# Patient Record
Sex: Female | Born: 1965 | Race: White | Hispanic: No | Marital: Married | Smoking: Current every day smoker
Health system: Southern US, Community
[De-identification: ages and names within clinical notes are randomized; demographics above are authoritative.]

## PROBLEM LIST (undated history)

## (undated) DIAGNOSIS — I499 Cardiac arrhythmia, unspecified: Secondary | ICD-10-CM

## (undated) DIAGNOSIS — J449 Chronic obstructive pulmonary disease, unspecified: Secondary | ICD-10-CM

## (undated) DIAGNOSIS — I341 Nonrheumatic mitral (valve) prolapse: Secondary | ICD-10-CM

## (undated) DIAGNOSIS — J439 Emphysema, unspecified: Secondary | ICD-10-CM

## (undated) DIAGNOSIS — M549 Dorsalgia, unspecified: Secondary | ICD-10-CM

## (undated) DIAGNOSIS — M1990 Unspecified osteoarthritis, unspecified site: Secondary | ICD-10-CM

## (undated) DIAGNOSIS — O039 Complete or unspecified spontaneous abortion without complication: Secondary | ICD-10-CM

## (undated) DIAGNOSIS — J45909 Unspecified asthma, uncomplicated: Secondary | ICD-10-CM

## (undated) DIAGNOSIS — E119 Type 2 diabetes mellitus without complications: Secondary | ICD-10-CM

## (undated) DIAGNOSIS — I10 Essential (primary) hypertension: Secondary | ICD-10-CM

## (undated) DIAGNOSIS — R002 Palpitations: Secondary | ICD-10-CM

## (undated) HISTORY — DX: Cardiac arrhythmia, unspecified: I49.9

## (undated) HISTORY — DX: Essential (primary) hypertension: I10

## (undated) HISTORY — DX: Type 2 diabetes mellitus without complications: E11.9

## (undated) HISTORY — PX: SKIN CANCER EXCISION: SHX779

---

## 1998-02-25 ENCOUNTER — Other Ambulatory Visit: Admission: RE | Admit: 1998-02-25 | Discharge: 1998-02-25 | Payer: Self-pay | Admitting: Gynecology

## 2000-05-22 ENCOUNTER — Emergency Department (HOSPITAL_COMMUNITY): Admission: EM | Admit: 2000-05-22 | Discharge: 2000-05-22 | Payer: Self-pay | Admitting: Emergency Medicine

## 2003-02-11 ENCOUNTER — Other Ambulatory Visit: Admission: RE | Admit: 2003-02-11 | Discharge: 2003-02-11 | Payer: Self-pay | Admitting: Gynecology

## 2005-04-02 ENCOUNTER — Emergency Department (HOSPITAL_COMMUNITY): Admission: EM | Admit: 2005-04-02 | Discharge: 2005-04-02 | Payer: Self-pay | Admitting: Family Medicine

## 2005-04-06 ENCOUNTER — Emergency Department (HOSPITAL_COMMUNITY): Admission: EM | Admit: 2005-04-06 | Discharge: 2005-04-06 | Payer: Self-pay | Admitting: Family Medicine

## 2005-07-26 ENCOUNTER — Emergency Department (HOSPITAL_COMMUNITY): Admission: EM | Admit: 2005-07-26 | Discharge: 2005-07-26 | Payer: Self-pay | Admitting: Emergency Medicine

## 2006-02-28 ENCOUNTER — Emergency Department (HOSPITAL_COMMUNITY): Admission: EM | Admit: 2006-02-28 | Discharge: 2006-02-28 | Payer: Self-pay | Admitting: Emergency Medicine

## 2007-05-19 ENCOUNTER — Emergency Department (HOSPITAL_COMMUNITY): Admission: EM | Admit: 2007-05-19 | Discharge: 2007-05-19 | Payer: Self-pay | Admitting: Emergency Medicine

## 2007-08-14 ENCOUNTER — Ambulatory Visit: Payer: Self-pay | Admitting: Obstetrics and Gynecology

## 2007-08-14 ENCOUNTER — Inpatient Hospital Stay (HOSPITAL_COMMUNITY): Admission: AD | Admit: 2007-08-14 | Discharge: 2007-08-14 | Payer: Self-pay | Admitting: Gynecology

## 2007-08-15 ENCOUNTER — Ambulatory Visit (HOSPITAL_COMMUNITY): Admission: RE | Admit: 2007-08-15 | Discharge: 2007-08-15 | Payer: Self-pay | Admitting: Obstetrics and Gynecology

## 2007-08-23 ENCOUNTER — Ambulatory Visit: Payer: Self-pay | Admitting: Obstetrics & Gynecology

## 2007-09-10 ENCOUNTER — Encounter: Payer: Self-pay | Admitting: Obstetrics and Gynecology

## 2007-09-10 ENCOUNTER — Inpatient Hospital Stay (HOSPITAL_COMMUNITY): Admission: AD | Admit: 2007-09-10 | Discharge: 2007-09-10 | Payer: Self-pay | Admitting: Obstetrics and Gynecology

## 2007-09-14 ENCOUNTER — Ambulatory Visit (HOSPITAL_COMMUNITY): Admission: RE | Admit: 2007-09-14 | Discharge: 2007-09-14 | Payer: Self-pay | Admitting: Obstetrics & Gynecology

## 2007-09-17 ENCOUNTER — Ambulatory Visit: Payer: Self-pay | Admitting: Obstetrics & Gynecology

## 2007-09-20 ENCOUNTER — Ambulatory Visit: Payer: Self-pay | Admitting: Obstetrics & Gynecology

## 2007-09-27 ENCOUNTER — Ambulatory Visit: Payer: Self-pay | Admitting: Obstetrics & Gynecology

## 2007-10-03 ENCOUNTER — Inpatient Hospital Stay (HOSPITAL_COMMUNITY): Admission: AD | Admit: 2007-10-03 | Discharge: 2007-10-03 | Payer: Self-pay | Admitting: Obstetrics and Gynecology

## 2007-10-03 ENCOUNTER — Ambulatory Visit: Payer: Self-pay | Admitting: Obstetrics and Gynecology

## 2007-10-10 ENCOUNTER — Ambulatory Visit: Payer: Self-pay | Admitting: Obstetrics & Gynecology

## 2007-10-12 ENCOUNTER — Ambulatory Visit (HOSPITAL_COMMUNITY): Admission: RE | Admit: 2007-10-12 | Discharge: 2007-10-12 | Payer: Self-pay | Admitting: Obstetrics & Gynecology

## 2007-10-17 ENCOUNTER — Ambulatory Visit: Payer: Self-pay | Admitting: Obstetrics & Gynecology

## 2007-10-24 ENCOUNTER — Ambulatory Visit: Payer: Self-pay | Admitting: Obstetrics & Gynecology

## 2007-10-26 ENCOUNTER — Observation Stay (HOSPITAL_COMMUNITY): Admission: AD | Admit: 2007-10-26 | Discharge: 2007-10-26 | Payer: Self-pay | Admitting: Family Medicine

## 2007-10-26 ENCOUNTER — Ambulatory Visit: Payer: Self-pay | Admitting: Family Medicine

## 2007-10-29 ENCOUNTER — Observation Stay (HOSPITAL_COMMUNITY): Admission: RE | Admit: 2007-10-29 | Discharge: 2007-10-30 | Payer: Self-pay | Admitting: Family Medicine

## 2007-10-29 ENCOUNTER — Ambulatory Visit: Payer: Self-pay | Admitting: Obstetrics & Gynecology

## 2007-11-05 ENCOUNTER — Inpatient Hospital Stay (HOSPITAL_COMMUNITY): Admission: AD | Admit: 2007-11-05 | Discharge: 2007-11-05 | Payer: Self-pay | Admitting: Obstetrics & Gynecology

## 2007-11-05 ENCOUNTER — Ambulatory Visit: Payer: Self-pay | Admitting: *Deleted

## 2007-11-13 ENCOUNTER — Inpatient Hospital Stay (HOSPITAL_COMMUNITY): Admission: AD | Admit: 2007-11-13 | Discharge: 2007-11-16 | Payer: Self-pay | Admitting: Obstetrics and Gynecology

## 2007-11-13 ENCOUNTER — Ambulatory Visit: Payer: Self-pay | Admitting: Obstetrics and Gynecology

## 2007-11-14 ENCOUNTER — Encounter: Payer: Self-pay | Admitting: Obstetrics and Gynecology

## 2007-11-30 ENCOUNTER — Inpatient Hospital Stay (HOSPITAL_COMMUNITY): Admission: AD | Admit: 2007-11-30 | Discharge: 2007-12-04 | Payer: Self-pay | Admitting: Obstetrics and Gynecology

## 2007-11-30 ENCOUNTER — Ambulatory Visit: Payer: Self-pay | Admitting: Obstetrics & Gynecology

## 2008-03-06 ENCOUNTER — Emergency Department (HOSPITAL_COMMUNITY): Admission: EM | Admit: 2008-03-06 | Discharge: 2008-03-06 | Payer: Self-pay | Admitting: Family Medicine

## 2008-04-06 ENCOUNTER — Emergency Department (HOSPITAL_COMMUNITY): Admission: EM | Admit: 2008-04-06 | Discharge: 2008-04-06 | Payer: Self-pay | Admitting: Emergency Medicine

## 2008-11-14 ENCOUNTER — Emergency Department (HOSPITAL_COMMUNITY): Admission: EM | Admit: 2008-11-14 | Discharge: 2008-11-14 | Payer: Self-pay | Admitting: Emergency Medicine

## 2010-06-14 ENCOUNTER — Emergency Department (HOSPITAL_COMMUNITY): Admission: EM | Admit: 2010-06-14 | Discharge: 2010-06-15 | Payer: Self-pay | Admitting: Emergency Medicine

## 2011-01-23 ENCOUNTER — Inpatient Hospital Stay (INDEPENDENT_AMBULATORY_CARE_PROVIDER_SITE_OTHER)
Admission: RE | Admit: 2011-01-23 | Discharge: 2011-01-23 | Disposition: A | Discharge status: Home / Self Care | Payer: Medicaid Other | Source: Ambulatory Visit | Attending: Family Medicine | Admitting: Family Medicine

## 2011-01-23 DIAGNOSIS — S335XXA Sprain of ligaments of lumbar spine, initial encounter: Secondary | ICD-10-CM

## 2011-02-22 NOTE — Discharge Summary (Signed)
Christy Mayer, Christy Mayer               ACCOUNT NO.:  [id number]   MEDICAL RECORD NO.:  [id number]           PATIENT TYPE:   LOCATION:                                [facility]  [facility]   PHYSICIAN:  Norton Blizzard, MD    DATE OF BIRTH:  1966-01-19   DATE OF ADMISSION:  10/29/2007  DATE OF DISCHARGE:  10/30/2007                               DISCHARGE SUMMARY   ADMISSION DIAGNOSIS:  Intrauterine pregnancy at 38-4/7th weeks  gestation, transverse presentation, desires external cephalic version.   POSTOPERATIVE DIAGNOSIS:  Status post successful external cephalic  version, failed medical induction of labor.   HISTORY:  The patient is a 45 year old, G 7, P 3, 0, 3, 3, at 26 4/7th  weeks who presented for external cephalic version.  Of note, the patient  had a prior external cephalic version performed on October 26, 2007, but  the fetus converted back to a transverse lie.  The patient opted for a  repeat attempt.  The patient had an uncomplicated external cephalic  version, cephalic presentation was also confirmed by ultrasound.   HOSPITAL COURSE:  Given the fetus' variable lie and concern for the  fetus reverting back to a noncephalic presentation, so the decision was  made to proceed with induction of labor.  On the first examination, the  cervix was noted to be dilated to 1 cm and long, posterior and firm.  Her induction was started with Cytotec.  Fetal heart rate tracing was  reassuring, but she did not demonstrate any cervical change throughout  the day.  She received five doses of Cytotec and before she received the  sixth dose, the patient was given a choice to go home and come back when  her cervix was more favorable, versus continuing with the induction,  which might end up in a cesarean section for a failed induction.   DISPOSITION:  The patient did opt to go home.  Labor precautions were  strictly reviewed and emphasized;  she was given instructions to come  back for painful  frequent contractions, loss of fluid, bleeding,  decreased fetal movement or any other concerns.  She verbalized  understanding of the plan.   FOLLOWUP:  The patient will follow up in the [facility] on November 01, 2007, at 10:15 a.m.  This appointment has already been scheduled for  her.  The patient was also told to call or come back for any other  concerns.      Norton Blizzard, MD  Electronically Signed     UAD/MEDQ  D:  10/30/2007  T:  10/30/2007  Job:  [id number]

## 2011-02-22 NOTE — Discharge Summary (Signed)
Christy Mayer, Christy Mayer               ACCOUNT NO.:  [id number]   MEDICAL RECORD NO.:  [id number]          PATIENT TYPE:  INP   LOCATION:  9103                          [facility]ACILITY:  [facility]   PHYSICIAN:  Phil D. Okey Dupre, M.D.     DATE OF BIRTH:  May 01, 1966   DATE OF ADMISSION:  11/13/2007  DATE OF DISCHARGE:  11/16/2007                               DISCHARGE SUMMARY   DISCHARGE DIAGNOSES:  1. Elective low transverse Cesarean section, primary.  2. Delivery of viable baby girl.   DISCHARGE MEDICATIONS:  1. Prenatal vitamins 1 p.o. daily.  2. Colace 100 mg 1 p.o. b.i.d.  3. Percocet 5/325 mg 1 p.o. q.6 h. p.r.n. pain.   PROCEDURES:  Primary low transverse C-section by Dr. Okey Dupre.   DISCHARGE LABS:  Hemoglobin 9.9, hematocrit 29.3.  RPR nonreactive.  Blood type A positive, antibody negative.  Rubella immune.  Syphilis  nonreactive.  HIV nonreactive.  Chlamydia and gonorrhea negative.  GBS  negative.   HOSPITAL COURSE:  In brief, this is a 45 year old G7, P4-0-3-4 who  presented at 40 weeks and 6 days gestation.  The patient initially  presented at onset of labor and vaginal delivery was anticipated, but  then the patient decided to convert to elective primary low transverse C-  section.  Surgery performed by Dr. Okey Dupre with assistance by Dr. Sharol Harness  under spinal anesthesia and viable female infant was delivered weighing  9 pounds 2 ounces with Apgar of  9 and 9 at one and five minutes  respectively.  The patient did have meconium stained amniotic fluid.  Estimated blood loss was 600 mL.  The patient desires to breast feed and  is undecided about birth control.  She will follow up with this on her 6-  week followup.  The patient advised no heavy lifting for 6 weeks, and  nothing per vagina for 6 weeks.  The patient desires to follow up with  [facility], as this is where she was seen previously.   FOLLOWUP:  The patient to follow with [facility] 6 weeks for  postpartum  check.      Eustaquio Boyden, MD      Phil D. Okey Dupre, M.D.  Electronically Signed    JG/MEDQ  D:  11/16/2007  T:  11/17/2007  Job:  [id number]

## 2011-02-22 NOTE — Op Note (Signed)
NAMEPERRIE, RAGIN               ACCOUNT NO.:  [id number]   MEDICAL RECORD NO.:  [id number]          PATIENT TYPE:  INP   LOCATION:  9103                          [facility]ACILITY:  [facility]   PHYSICIAN:  Phil D. Okey Dupre, M.D.     DATE OF BIRTH:  [birth year]   DATE OF PROCEDURE:  DATE OF DISCHARGE:                               OPERATIVE REPORT   PREOPERATIVE DIAGNOSES:  1. Intrauterine pregnancy at 41 weeks, 0 days.  2. Cephalopelvic disproportion.  3. Failed induction.  4. Group B Strep negative.   POSTOPERATIVE DIAGNOSES:  1. Intrauterine pregnancy at 41 weeks, 0 days.  2. Cephalopelvic disproportion.  3. Failed induction.  4. Group B Strep negative.  5. Meconium-stained amniotic fluid.   PROCEDURE:  Primary low transverse cesarean section.   SURGEON:  Bettye Boeck. Okey Dupre, MD.   ASSISTANT:  Karlton Lemon, MD.   ANESTHESIA:  Spinal.   FINDINGS:  1. Viable infant female weighing 9 pounds 10 ounces with Apgar's 9 at      one minute and 9 at five minutes, with nuchal cord x1.  2. Meconium-stained amniotic fluid.  3. Normal female pelvic anatomy.   ESTIMATED BLOOD LOSS:  600 ml.   DRAIN:  Foley with 125 ml of clear yellow urine.   FLUIDS:  LR 1800 ml.   COMPLICATIONS:  None immediate.   SPECIMENS:  Placenta to Pathology; cord pH to lab and pending at time of  dictation; cord blood to cord bank.   INDICATION FOR PROCEDURE:  Ms. Yeomans is a gravida 7, para 3-0-3-3, at  [redacted] weeks gestation, has a history of two successful versions with  reconversion to breech and oblique presentations, who presented on  November 13, 2007, with contractions.  The infant was vertex at the  time, and induction was undertaken to attempt to deliver the patient due  to unstable lie.  She received one dose of Cervidil and one dose of  Cytotec without any significant change, and the head was out of the  pelvis.  It was determined that she had cephalopelvic disproportion and  a failed induction, and a primary  low transverse cesarean section was  indicated.   DESCRIPTION OF PROCEDURE:  The patient was taken to the operating room  and after obtaining adequate spinal anesthesia was prepped and draped in  the usual sterile manner in the supine position with left lateral  uterine displacement.  After ensuring adequate anesthesia, a  Pfannenstiel skin incision was made using a scalpel.  The incision was  carried down through the subcutaneous tissues using the scalpel.  The  rectus fascia was nicked in the midline, and the incision was extended  laterally in each direction using Mayo scissors.  The rectus muscles  were dissected free of the fascia using both sharp and blunt dissection.  The rectus muscles were separated bluntly.  The parietal peritoneum was  identified, grasped with a hemostat, elevated and entered with  Metzenbaum scissors.  An Alexis  wound retractor was then placed.  A  reflection on the visceral peritoneum superior to the bladder was  identified and elevated  and incised using Metzenbaum scissors and the  incision was extended laterally.  A bladder flap was created using blunt  dissection.  A low transverse uterine incision was made using a scalpel,  and the incision was extended laterally and superiorly using blunt  dissection and bandage scissors.  A hand was placed within the uterine  cavity and used to elevate and flex the head of the infant, which was  delivered without difficulty.  The infant was bulb suctioned after  delivery of the head.  The body of the infant was then delivered without  difficulty.  A nuchal cord x1 was reduced after delivery of the head.  The cord was doubly clamped and cut, and the infant handed to the  [employer] in attendance.  Grossly meconium stained amniotic fluid was  noted at rupture of membranes.  A specimen was collected for cord blood  and cord pH.  The placenta was delivered with cord traction and appeared  intact.  The endometrial cavity  was wiped free of any traces of  membranes using wet laparotomy sponges.  The edges of the uterine  incision were grasped with ring clamps, and the uterine incision was  closed in one layer with a running locking stitch of #0 Vicryl.  The  uterine incision was inspected and found to have adequate hemostasis.  The uterus was then irrigated with copious amounts of normal saline.  The uterine incision was reinspected and found to have adequate  hemostasis.  The rectus muscles were inspected and found to have good  hemostasis.  The rectus fascia was reapproximated with one suture of #0  Vicryl in a running unlocked manner.  Small areas of bleeding in the  subcutaneous tissues were controlled using Bovie cautery.  The skin was  reapproximated with staples.  Sponge, needle, and instrument counts were  correct x2.  The patient tolerated the procedure well and went to the  recovery room in stable condition.      Karlton Lemon, MD  Electronically Signed     ______________________________  Javier Glazier. Okey Dupre, M.D.    NS/MEDQ  D:  11/14/2007  T:  11/14/2007  Job:  [id number]

## 2011-02-22 NOTE — Discharge Summary (Signed)
NAMEKIAIRA, POINTER               ACCOUNT NO.:  [id number]   MEDICAL RECORD NO.:  [id number]          PATIENT TYPE:  INP   LOCATION:  9303                          [facility]ACILITY:  [facility]   PHYSICIAN:  Tanya S. Shawnie Pons, M.D.   DATE OF BIRTH:  [birth year]   DATE OF ADMISSION:  11/30/2007  DATE OF DISCHARGE:  12/04/2007                               DISCHARGE SUMMARY   DISCHARGE DIAGNOSES:  1. Escherichia coli pyelonephritis.  2. Endometritis.  3. POD #20 status post low transverse cesarean section.   PROCEDURE:  1. Transvaginal ultrasound on February 21 showing heterogeneous fluid      within the endometrial canal consistent with endometritis and small      bladder flap hematoma/seroma.  2. Bilateral Renal Ultrasound on February 23 showing normal kidneys.   DISCHARGE LABORATORY DATA:  Urine culture growing greater than 100,000  colonies of Escherichia coli resistant to ampicillin, but pan sensitive  otherwise.  White blood cell count 7.2, hemoglobin 9.6, hematocrit 28.0,  and platelets 203.  Blood cultures; no growth to date x2.  Urinalysis  showing small leukocyte esterase and positive nitrites.  Sodium 135,  creatinine 0.76.   DISCHARGE MEDICATIONS:  1. Keflex one p.o. q.i.d. for 10 days to complete 2-week course of      antibiotic treatment.  2. Tylenol as needed.  3. Prenatal vitamins daily while breastfeeding.   HOSPITAL COURSE:  This is a 45 year old gravida 7, para 4-0-3-4,  admitted on postoperative day #16 after primary low transverse cesarean  section for failed induction who had a fever to 103 degrees, abdominal  pain, and vaginal bleeding.  The patient was initially placed on  ampicillin, gentamicin, and Clindamycin IV antibiotics triple therapy  and slowly improved in abdominal pain and defervesced.  When urine  culture returned growing Escherichia coli resistant to ampicillin, the  ampicillin was removed from treatment and Ancef was added.  Transvaginal  ultrasound showed  evidence of endometritis as well.  The patient  completed a five-day course of IV antibiotics and will be transitioned  to p.o. Keflex for remainder of course of treatment.  Prior to discharge  the patient had remained afebrile for greater than 48 hours and  abdominal pain had subsided.  The patient is discharged in stable  condition.   FOLLOWUP:  The patient is to follow up with either [facility] or [facility] for six weeks OB follow-up in  three weeks.  The patient is to call for appointment.      Eustaquio Boyden, MD      Shelbie Proctor. Shawnie Pons, M.D.  Electronically Signed    JG/MEDQ  D:  12/04/2007  T:  12/04/2007  Job:  [id number]

## 2011-04-16 ENCOUNTER — Inpatient Hospital Stay (HOSPITAL_COMMUNITY)
Admission: EM | Admit: 2011-04-16 | Discharge: 2011-04-18 | DRG: 313 | Disposition: A | Discharge status: Home / Self Care | Payer: Medicaid Other | Attending: Internal Medicine | Admitting: Internal Medicine

## 2011-04-16 ENCOUNTER — Emergency Department (HOSPITAL_COMMUNITY): Payer: Medicaid Other

## 2011-04-16 DIAGNOSIS — I82819 Embolism and thrombosis of superficial veins of unspecified lower extremities: Secondary | ICD-10-CM | POA: Diagnosis present

## 2011-04-16 DIAGNOSIS — R079 Chest pain, unspecified: Principal | ICD-10-CM | POA: Diagnosis present

## 2011-04-16 DIAGNOSIS — F17200 Nicotine dependence, unspecified, uncomplicated: Secondary | ICD-10-CM | POA: Diagnosis present

## 2011-04-16 DIAGNOSIS — E781 Pure hyperglyceridemia: Secondary | ICD-10-CM | POA: Diagnosis present

## 2011-04-16 DIAGNOSIS — R51 Headache: Secondary | ICD-10-CM | POA: Diagnosis not present

## 2011-04-16 DIAGNOSIS — D649 Anemia, unspecified: Secondary | ICD-10-CM | POA: Diagnosis present

## 2011-04-16 DIAGNOSIS — E669 Obesity, unspecified: Secondary | ICD-10-CM | POA: Diagnosis present

## 2011-04-16 DIAGNOSIS — I803 Phlebitis and thrombophlebitis of lower extremities, unspecified: Secondary | ICD-10-CM

## 2011-04-16 DIAGNOSIS — I82409 Acute embolism and thrombosis of unspecified deep veins of unspecified lower extremity: Secondary | ICD-10-CM

## 2011-04-16 DIAGNOSIS — F411 Generalized anxiety disorder: Secondary | ICD-10-CM | POA: Diagnosis present

## 2011-04-16 DIAGNOSIS — I059 Rheumatic mitral valve disease, unspecified: Secondary | ICD-10-CM | POA: Diagnosis present

## 2011-04-16 LAB — DIFFERENTIAL
Eosinophils Absolute: 0.2 10*3/uL (ref 0.0–0.7)
Eosinophils Relative: 2 % (ref 0–5)
Lymphocytes Relative: 25 % (ref 12–46)
Lymphs Abs: 2.2 10*3/uL (ref 0.7–4.0)
Monocytes Absolute: 0.6 10*3/uL (ref 0.1–1.0)
Monocytes Relative: 7 % (ref 3–12)
Neutro Abs: 5.6 10*3/uL (ref 1.7–7.7)
Neutrophils Relative %: 66 % (ref 43–77)

## 2011-04-16 LAB — BASIC METABOLIC PANEL
BUN: 20 mg/dL (ref 6–23)
Calcium: 9.1 mg/dL (ref 8.4–10.5)
Chloride: 103 mEq/L (ref 96–112)
Creatinine, Ser: 0.78 mg/dL (ref 0.50–1.10)
GFR calc Af Amer: >60 mL/min (ref >60)
GFR calc non Af Amer: >60 mL/min (ref >60)
Glucose, Bld: 84 mg/dL (ref 70–99)
Potassium: 4 mEq/L (ref 3.5–5.1)
Sodium: 137 mEq/L (ref 135–145)

## 2011-04-16 LAB — CK TOTAL AND CKMB (NOT AT ARMC)
CK, MB: 1.6 ng/mL (ref 0.3–4.0)
CK, MB: 1.4 ng/mL (ref 0.3–4.0)
Relative Index: INVALID (ref 0.0–2.5)
Total CK: 52 U/L (ref 7–177)

## 2011-04-16 LAB — CBC
HCT: 33.4 % — ABNORMAL LOW (ref 36.0–46.0)
MCH: 28.1 pg (ref 26.0–34.0)
MCV: 83.7 fL (ref 78.0–100.0)
RBC: 3.99 MIL/uL (ref 3.87–5.11)
RDW: 15.3 % (ref 11.5–15.5)
WBC: 8.5 10*3/uL (ref 4.0–10.5)

## 2011-04-16 LAB — TROPONIN I
Troponin I: <0.3 ng/mL (ref <0.30)
Troponin I: <0.3 ng/mL (ref <0.30)

## 2011-04-16 LAB — D-DIMER, QUANTITATIVE: D-Dimer, Quant: 1.06 ug/mL-FEU — ABNORMAL HIGH (ref 0.00–0.48)

## 2011-04-16 MED ORDER — IOHEXOL 300 MG/ML  SOLN: 80.0000 mL | Freq: Once | INTRAMUSCULAR | Status: AC | PRN

## 2011-04-17 DIAGNOSIS — R079 Chest pain, unspecified: Secondary | ICD-10-CM

## 2011-04-17 DIAGNOSIS — I517 Cardiomegaly: Secondary | ICD-10-CM

## 2011-04-17 LAB — LIPID PANEL
HDL: 43 mg/dL (ref >39)
LDL Cholesterol: 93 mg/dL (ref 0–99)

## 2011-04-17 LAB — BASIC METABOLIC PANEL
CO2: 23 mEq/L (ref 19–32)
Calcium: 8.3 mg/dL — ABNORMAL LOW (ref 8.4–10.5)
GFR calc Af Amer: >60 mL/min (ref >60)
GFR calc non Af Amer: >60 mL/min (ref >60)
Sodium: 137 mEq/L (ref 135–145)

## 2011-04-17 LAB — CBC
MCH: 27.2 pg (ref 26.0–34.0)
Platelets: 255 10*3/uL (ref 150–400)
RBC: 3.89 MIL/uL (ref 3.87–5.11)
WBC: 6.3 10*3/uL (ref 4.0–10.5)

## 2011-04-17 LAB — TSH: TSH: 4.366 u[IU]/mL (ref 0.350–4.500)

## 2011-04-17 LAB — CARDIAC PANEL(CRET KIN+CKTOT+MB+TROPI)
Total CK: 53 U/L (ref 7–177)
Troponin I: <0.3 ng/mL (ref <0.30)

## 2011-04-18 ENCOUNTER — Inpatient Hospital Stay (HOSPITAL_COMMUNITY): Payer: Medicaid Other

## 2011-04-18 LAB — LUPUS ANTICOAGULANT PANEL
DRVVT: 40.4 secs (ref 36.2–44.3)
Lupus Anticoagulant: NOT DETECTED
PTT Lupus Anticoagulant: 33.4 secs (ref 30.0–45.6)

## 2011-04-18 LAB — CBC
Hemoglobin: 9.9 g/dL — ABNORMAL LOW (ref 12.0–15.0)
MCH: 27.3 pg (ref 26.0–34.0)
MCHC: 31.8 g/dL (ref 30.0–36.0)
MCV: 85.9 fL (ref 78.0–100.0)
RBC: 3.62 MIL/uL — ABNORMAL LOW (ref 3.87–5.11)

## 2011-04-18 LAB — PROTEIN C ACTIVITY: Protein C Activity: 107 % (ref 75–133)

## 2011-04-18 LAB — ANTITHROMBIN III: AntiThromb III Func: 95 % (ref 76–126)

## 2011-04-18 LAB — PROTEIN S ACTIVITY: Protein S Activity: 115 % (ref 69–129)

## 2011-04-18 MED ORDER — TECHNETIUM TC 99M TETROFOSMIN IV KIT
10.0000 | PACK | Freq: Once | INTRAVENOUS | Status: AC | PRN
  Administered 2011-04-18: 10 via INTRAVENOUS

## 2011-04-18 MED ORDER — TECHNETIUM TC 99M TETROFOSMIN IV KIT
30.0000 | PACK | Freq: Once | INTRAVENOUS | Status: AC | PRN
  Administered 2011-04-18: 30 via INTRAVENOUS

## 2011-04-19 ENCOUNTER — Other Ambulatory Visit (HOSPITAL_COMMUNITY): Payer: Medicaid Other

## 2011-04-22 LAB — BETA-2-GLYCOPROTEIN I ABS, IGG/M/A
Beta-2-Glycoprotein I IgA: 2 A Units (ref <20)
Beta-2-Glycoprotein I IgM: 7 M Units (ref <20)

## 2011-04-22 LAB — CARDIOLIPIN ANTIBODIES, IGG, IGM, IGA
Anticardiolipin IgA: 5 APL U/mL — ABNORMAL LOW (ref <22)
Anticardiolipin IgG: 3 GPL U/mL — ABNORMAL LOW (ref <23)
Anticardiolipin IgM: 4 MPL U/mL — ABNORMAL LOW (ref <11)

## 2011-04-24 NOTE — Discharge Summary (Signed)
Christy Mayer, Christy Mayer               ACCOUNT NO.:  [id number]  MEDICAL RECORD NO.:  [id number]  LOCATION:  3703                         FACILITY:  [facility]  PHYSICIAN:  Jeoffrey Massed, MD    DATE OF BIRTH:  29-Nov-1965  DATE OF ADMISSION:  04/16/2011 DATE OF DISCHARGE:                        DISCHARGE SUMMARY - REFERRING   PRIMARY CARE PRACTITIONER:  The patient does not recollect the name.  PRIMARY DISCHARGE DIAGNOSES: 1. Chest pain, ruled out by enzymes and Lexiscan stress test is     negative. 2. Superficial thrombosis and the varicosities in the distal to mid     posterior thigh. 3. Obesity. 4. Anxiety. 5. Mitral valve prolapse.  DISCHARGE MEDICATIONS: 1. Aspirin 81 mg one tablet daily. 2. Tylenol 325 mg 1-2 tablets p.o. q.8 h. p.r.n.  CONSULTATIONS:  Dr. Gala Romney from [facility].  BRIEF HISTORY OF PRESENT ILLNESS:  The patient is a 45 year old Caucasian female who has a past medical history of obesity and anxiety, who was brought to the ED for left-sided chest pain which she described as tightness.  She was evaluated and then admitted to the hospitalist service for further evaluation and treatment.  For further details, please see the history and physical that was dictated by Dr. Della Goo on admission.  PERTINENT RADIOLOGICAL STUDIES: 1. CT angiogram of the chest showed no evidence of acute pulmonary     embolism.  No pulmonary parenchymal abnormalities. 2. Lexiscan showed no evidence of inducible left ventricular     myocardial ischemia.  Left ventricle ejection fraction was     calculated at 56%.  PERTINENT LABORATORY DATA: 1. LDL cholesterol was 93, triglycerides were slightly elevated at     222. 2. TSH was 4.366. 3. Cardiac enzymes were cycled, and these were negative. 4. Hypercoagulable panel was pending.  A 2-D echocardiogram showed systolic function to be normal, EF around 55- 60%.  Wall motion was normal.  BRIEF HOSPITAL COURSE: 1.  Chest pain.  The patient was admitted with left-sided chest     pressure, which she described as chest tightness.  There were lot     of both typical and atypical features for the chest pain.  She was     admitted and cardiac enzymes were cycled, and these were negative.     Her D-dimer was slightly elevated, and the patient then underwent a     CT angiogram of the chest which was negative as well for pulmonary     embolism.  Even though enzymes were negative, the patient continued     to have chest pain and as a result Cardiology was consulted.  This     cardiology consultation was then provided by Dr. Nicholes Mango from     [facility] who offered the patient the choice of either a     stress test or a cardiac cath.  The patient then decided that she     would rather go for a stress test which was subsequently done today     and the results of which are negative as noted above.  During the     day today, the patient's chest pain has by large resolved, and she  is anxious to be discharged home.  Patient is to continue taking a    baby aspirin a day. 2. Tobacco abuse.  She was counseled extensively by me and also by our     Tobacco Cessation team.  The patient is unfortunately not ready to     quit yet.  She has been told the risk numerous times by me as well. 3. Superficial thrombosis of the right lower extremity.  Initially,     the patient was placed on Lovenox.  However, the patient refused to     be placed on anticoagulation and claimed that she will just try hot     compresses and take aspirin.  I did inform her that ideally perhaps     a month course of Lovenox may be indicated.  However, the patient     emphatically refused.  I did discuss this case with Dr. Tawanna Cooler Early     over the phone from [facility] who suggested that     perhaps local care and aspirin may be indicated in this     patient as well. 4. Mild hypertriglyceridemia.  The patient has been  encouraged to lose     weight and dietary modification along with lifestyle and exercise     regimen.  She claims understanding.  She is to follow up with the     primary care practitioner in the next few weeks and perhaps a lipid     profile needs to be redrawn in around 3 months' time, and if she     continues to have elevated triglyceride levels then perhaps at that     point starting the medication may be indicated. 5. Obesity.  The patient has been encouraged to lose weight and has     been encouraged for dietary and lifestyle as well as exercise     regimen.  She claims understanding.  DISPOSITION:  Since the patient's cardiac enzymes are negative and her stress test is also negative, she is deemed stable to be discharged home.  FOLLOW-UP INSTRUCTIONS:  The patient is to follow up with her primary care practitioner in the next 2-3 weeks.  She is to call and make an appointment.  Total time spent for discharge equals 30 minutes.     Jeoffrey Massed, MD     SG/MEDQ  D:  04/18/2011  T:  04/18/2011  Job:  [id number]  Electronically Signed by Jeoffrey Massed  on 04/24/2011 11:30:14 AM

## 2011-05-02 NOTE — H&P (Signed)
Christy Mayer, Christy Mayer               ACCOUNT NO.:  [id number]  MEDICAL RECORD NO.:  [id number]  LOCATION:  3703                         FACILITY:  [facility]  PHYSICIAN:  Della Goo, M.D. DATE OF BIRTH:  09-27-1966  DATE OF ADMISSION:  04/16/2011 DATE OF DISCHARGE:                             HISTORY & PHYSICAL   PRIMARY CARE PHYSICIAN:  Unassigned.  CHIEF COMPLAINT:  Chest pain.  HISTORY OF PRESENT ILLNESS:  This is a 45 year old female who was brought to the emergency department by her husband secondary to complaints of worsening chest pain over the past 3 days.  She reports having intermittent chest tightness, felt like a squeezing sensation. She rated the pain as being a 6-7/10 at the worst.  She reports during the day prior to admission the pain began to become constant and dull. She denies having any radiation of this pain.  The pain was worse with movement.  She denies having any shortness of breath, nausea, vomiting or diaphoresis.  The pain was relieved when she arrived in the emergency department and was administered nitroglycerin x1.  The patient also has complaints of worsening swelling and pain in the back of her right thigh.  The patient was evaluated in the emergency department and cardiac studies were started for cardiac rule out.  A venous duplex ultrasound was ordered of the right lower extremity to evaluate for deep venous thrombosis results of which returned revealing a superficial thrombosis. The patient was referred for medical admission.  PAST MEDICAL HISTORY:  Significant for: 1. Mitral valve prolapse. 2. Anxiety.  PAST SURGICAL HISTORY:  History of a C-section x1.  MEDICATIONS:  None.  ALLERGIES:  No known drug allergies.  SOCIAL HISTORY:  The patient is married with 3 children.  She is a smoker, smokes less than a half-a-pack of cigarettes daily.  She has been smoking since age 18.  She is a nondrinker.  Denies any illicit drug  usage.  FAMILY HISTORY:  Unknown.  She reports that she is adopted.  REVIEW OF SYSTEMS:  Pertinents mentioned above.  PHYSICAL EXAMINATION:  GENERAL:  This is a 45 year old overweight Caucasian female who is in no acute distress currently. VITAL SIGNS:  Her temperature 99.4, blood pressure 137/80, heart rate 93, respirations 16, O2 sat 98-100%. HEENT:  Normocephalic, atraumatic.  Pupils equally round, reactive to light.  Extraocular movements are intact.  Funduscopic benign.  There is no scleral icterus.  Nares are patent bilaterally.  Oropharynx is clear. NECK:  Supple.  Full range of motion.  No thyromegaly, adenopathy, jugular venous distention. CARDIOVASCULAR:  Regular rate and rhythm.  No murmurs, gallops, or rubs appreciated. CHEST WALL:  Nontender.  No unusual masses.  Breathing is unlabored and chest wall excursion is symmetric. ABDOMEN:  Positive bowel sounds, soft, nontender, nondistended.  No hepatosplenomegaly. EXTREMITIES:  Without cyanosis, clubbing, or edema.  There is no Homans sign on examination.  The calf is not tender in the right lower extremity.  The patient does have a palpable cord in the posterior and medial aspect of the right thigh. NEUROLOGIC:  Examination nonfocal.  LABORATORY STUDIES:  White blood cell count 8.5, hemoglobin 11.2, hematocrit 33.4, platelets 249, neutrophils 66%,  lymphocytes 25%. Sodium 137, potassium 4.0, chloride 103, CO2 of 23, BUN 20, creatinine 0.78, and glucose 84.  Cardiac markers with a total CK of 55, CK-MB 1.6, and troponin less than 0.30.  Chest x-ray reveals no acute disease findings.  CT scan of the chest with angiogram study revealed no filling defects within the pulmonary arteries.  No acute findings of the aorta or great vessels.  ASSESSMENT:  A 45 year old female being admitted with 1. Chest pain. 2. Superficial thrombosis of the right thigh. 3. History of mitral valve prolapse. 4. Mild anemia. 5. Obesity.  PLAN:   The patient will be admitted to telemetry area for monitoring. Cardiac enzymes will be performed.  The patient will be placed on nitro paste, oxygen, aspirin therapies, and the patient will also be placed on full-dose Lovenox therapy.  However, the primary rounding team may decide to discontinue this treatment since the thrombosis is actually a superficial thrombosis.  A coagulation study has been ordered as well prior to administration of the Lovenox.  The patient may not require Coumadin therapy and Lovenox treatments.  The patient is a full code.     Della Goo, M.D.     HJ/MEDQ  D:  04/17/2011  T:  04/17/2011  Job:  [id number]  Electronically Signed by Della Goo M.D. on 05/02/2011 12:10:45 PM

## 2011-06-07 NOTE — Consult Note (Signed)
NAMEARYKA, COONRADT               ACCOUNT NO.:  [id number]  MEDICAL RECORD NO.:  [id number]  LOCATION:  3703                         FACILITY:  [facility]  PHYSICIAN:  Bevelyn Buckles. Bensimhon, MDDATE OF BIRTH:  1966-03-10  DATE OF CONSULTATION:  04/17/2011 DATE OF DISCHARGE:                                CONSULTATION   REQUESTING PHYSICIAN:  [facility], clinical.  REASON FOR CONSULTATION:  Chest pain.  HISTORY OF PRESENT ILLNESS:  [name] is a 45 year old woman with a history of obesity, tobacco use, and reported mitral valve prolapse, which was diagnosed at age 27.  She denies any history of known coronary artery disease.  She has never had a stress test or cardiac catheterization.  She was admitted earlier today with a several-day history of chest pain and tightness.  She says the tightness is fairly constant, but any time she gets up and moves around, she feels the chest pain gets worse.  She denies any shortness of breath.  She does have some nausea, but says that is unrelated to the chest pain.  Her EKG has been normal.  She has had several sets of cardiac enzymes, all of which have been normal.  A chest CT was done due to mildly elevated D-dimer and this showed no evidence of pulmonary embolus or significant lung disease.  While in the hospital, she has also been diagnosed with superficial thrombophlebitis of the right thigh.  REVIEW OF SYSTEMS:  She denies any fevers or chills.  No diarrhea.  No focal neurologic problems.  She does have frequent palpitations, but no syncope or presyncope.  The remainder of review of systems is negative except for HPI and problem list.  PAST MEDICAL HISTORY: 1. Tobacco use, ongoing. 2. Obesity. 3. History of mitral valve prolapse. 4. Palpitations.  CURRENT MEDICATIONS: 1. Aspirin 325 a day. 2. Enoxaparin. 3. Nicotine patch.  ALLERGIES:  No known drug allergies.  SOCIAL HISTORY:  She is married with three children.  She  smokes half pack of cigarettes a day.  She has been smoking since age 2.  She is not a drinker.  She denies any illicit drug use.  FAMILY HISTORY:  Unknown as she is adopted.  PHYSICAL EXAM:  GENERAL:  She is sitting up in bed, in no acute distress.  Respirations are unlabored. VITAL SIGNS:  Blood pressure is 107/68, heart rate 61.  She is satting 97% on room air.  She is afebrile. HEENT:  Normal. NECK:  Supple.  There is no JVD.  Carotids are 2+ bilaterally without bruits.  There is no lymphadenopathy or thyromegaly. CARDIAC:  PMI is not palpable.  Regular rate and rhythm.  No murmurs, rubs, or gallops. LUNGS:  Clear. ABDOMEN:  Obese, nontender, nondistended.  No hepatosplenomegaly.  No bruits.  No masses appreciated. EXTREMITIES:  Warm with no cyanosis, clubbing, or edema.  She does have some mild redness and induration on the back of her right thigh at the site of her thrombophlebitis. NEURO:  Alert and oriented x3.  Cranial nerves II through XII are intact.  Moves all four extremities without difficulty.  Affect is pleasant.  LABORATORY DATA:  EKG shows sinus rhythm at a  rate of 89.  There is no ST-T wave abnormalities.  Troponin is less than 0.30 on two occasions.  Sodium 137, potassium 3.3, BUN 17, creatinine 0.75.  Triglycerides are 220, total cholesterol is 180, HDL 43, LDL 93.  White count 6.3, hemoglobin 10.6, platelet count 255.  Review of CT shows no evidence of pulmonary embolus.  This was reviewed personally.  ASSESSMENT: 1. Chest tightness/pain worse with exertion.     a.     Normal EKG and cardiac markers.     b.     Normal chest CT. 2. Right lower extremity superficial thrombophlebitis. 3. Tobacco use, ongoing. 4. Reported history of mitral valve prolapse. 5. Obesity.  PLAN/DISCUSSION:  [name] chest pain has typical and atypical features.  Her cardiac enzymes and EKG remains normal.  I had a long discussion with her about cardiac catheterization  versus stress testing. She would like to discuss this further with her husband.  I will follow up later with her today to see what their decision is.  We will keep her n.p.o. after midnight, agree with aspirin.  We can consider low-dose beta-blocker as tolerated.     Bevelyn Buckles. Bensimhon, MD     DRB/MEDQ  D:  04/17/2011  T:  04/17/2011  Job:  [id number]  Electronically Signed by Arvilla Meres MD on 06/07/2011 05:13:28 PM

## 2011-06-30 LAB — CBC
HCT: 36.7
MCV: 86.6
Platelets: 194
RDW: 15.5
WBC: 9.2

## 2011-06-30 LAB — POCT URINALYSIS DIP (DEVICE)
Ketones, ur: NEGATIVE
Ketones, ur: NEGATIVE
Nitrite: NEGATIVE
Operator id: 148111
Operator id: 297281
Protein, ur: 30 — AB
Protein, ur: NEGATIVE
Urobilinogen, UA: 0.2
Urobilinogen, UA: 0.2
pH: 6
pH: 6

## 2011-06-30 LAB — RPR: RPR Ser Ql: NONREACTIVE

## 2011-07-01 LAB — CBC
HCT: 28 — ABNORMAL LOW
Hemoglobin: 9.6 — ABNORMAL LOW
MCHC: 33.8
MCHC: 34.5
MCV: 85.2
MCV: 85.9
Platelets: 214
RBC: 3.38 — ABNORMAL LOW
RBC: 3.26 — ABNORMAL LOW
RBC: 3.5 — ABNORMAL LOW
RBC: 4.08
RDW: 15.4
RDW: 15.3
WBC: 10.4
WBC: 7.2

## 2011-07-01 LAB — DIFFERENTIAL
Eosinophils Absolute: 0.2
Eosinophils Relative: 2
Lymphocytes Relative: 17
Lymphs Abs: 1.7
Lymphs Abs: 1.8
Monocytes Absolute: 0.9
Monocytes Relative: 11
Monocytes Relative: 12
Neutro Abs: 7.5
Neutrophils Relative %: 60
Neutrophils Relative %: 72

## 2011-07-01 LAB — URINE CULTURE: Colony Count: >=100000

## 2011-07-01 LAB — COMPREHENSIVE METABOLIC PANEL
ALT: 30
AST: 20
CO2: 25
Calcium: 8.5
Creatinine, Ser: 0.76
GFR calc Af Amer: >60
GFR calc non Af Amer: >60
Sodium: 135
Total Protein: 6.2

## 2011-07-01 LAB — URINALYSIS, ROUTINE W REFLEX MICROSCOPIC
Ketones, ur: NEGATIVE
Nitrite: POSITIVE — AB
Protein, ur: 30 — AB

## 2011-07-01 LAB — CULTURE, BLOOD (ROUTINE X 2)

## 2011-07-01 LAB — URINE MICROSCOPIC-ADD ON

## 2011-07-01 LAB — CCBB MATERNAL DONOR DRAW

## 2011-07-07 LAB — POCT CARDIAC MARKERS
Operator id: 257131
Troponin i, poc: <0.05

## 2011-07-07 LAB — POCT I-STAT, CHEM 8
BUN: 13
Calcium, Ion: 1.16
Creatinine, Ser: 0.9
TCO2: 23

## 2011-07-07 LAB — URINALYSIS, ROUTINE W REFLEX MICROSCOPIC
Bilirubin Urine: NEGATIVE
Glucose, UA: NEGATIVE
Hgb urine dipstick: NEGATIVE
Protein, ur: NEGATIVE

## 2011-07-07 LAB — PROTIME-INR: INR: 0.9

## 2011-07-07 LAB — POCT PREGNANCY, URINE: Preg Test, Ur: NEGATIVE

## 2011-07-15 LAB — POCT URINALYSIS DIP (DEVICE)
Hgb urine dipstick: NEGATIVE
Ketones, ur: NEGATIVE
Operator id: 297281
Protein, ur: NEGATIVE
Protein, ur: NEGATIVE
Specific Gravity, Urine: 1.025
Specific Gravity, Urine: >=1.03
Urobilinogen, UA: 0.2
pH: 6.5

## 2011-07-15 LAB — URINALYSIS, ROUTINE W REFLEX MICROSCOPIC
Bilirubin Urine: NEGATIVE
Glucose, UA: NEGATIVE
Hgb urine dipstick: NEGATIVE
Ketones, ur: 15 — AB
Protein, ur: NEGATIVE

## 2011-07-18 LAB — FETAL FIBRONECTIN: Fetal Fibronectin: NEGATIVE

## 2011-07-18 LAB — WET PREP, GENITAL: Clue Cells Wet Prep HPF POC: NONE SEEN

## 2011-07-18 LAB — URINALYSIS, ROUTINE W REFLEX MICROSCOPIC
Nitrite: NEGATIVE
Specific Gravity, Urine: >1.03 — ABNORMAL HIGH
Urobilinogen, UA: 0.2

## 2011-07-18 LAB — POCT URINALYSIS DIP (DEVICE)
Glucose, UA: NEGATIVE
Hgb urine dipstick: NEGATIVE
Nitrite: NEGATIVE
Protein, ur: NEGATIVE
Urobilinogen, UA: 0.2

## 2011-07-19 LAB — POCT URINALYSIS DIP (DEVICE)
Hgb urine dipstick: NEGATIVE
Operator id: 159681
Protein, ur: NEGATIVE
Specific Gravity, Urine: 1.025
Urobilinogen, UA: 0.2
pH: 5.5

## 2011-07-19 LAB — URINALYSIS, ROUTINE W REFLEX MICROSCOPIC
Bilirubin Urine: NEGATIVE
Hgb urine dipstick: NEGATIVE
Ketones, ur: NEGATIVE
Protein, ur: NEGATIVE
Urobilinogen, UA: 0.2

## 2011-07-25 LAB — CBC
HCT: 33.8 — ABNORMAL LOW
Hemoglobin: 11.3 — ABNORMAL LOW
MCHC: 33.5
MCV: 82
RBC: 4.12
RDW: 17.9 — ABNORMAL HIGH

## 2011-07-25 LAB — DIFFERENTIAL
Basophils Absolute: 0
Basophils Relative: 0
Eosinophils Relative: 1
Monocytes Absolute: 0.5
Monocytes Relative: 7

## 2011-07-25 LAB — GC/CHLAMYDIA PROBE AMP, GENITAL
Chlamydia, DNA Probe: NEGATIVE
GC Probe Amp, Genital: NEGATIVE

## 2011-07-25 LAB — URINALYSIS, ROUTINE W REFLEX MICROSCOPIC
Glucose, UA: NEGATIVE
Hgb urine dipstick: NEGATIVE
Ketones, ur: NEGATIVE
Protein, ur: NEGATIVE
Urobilinogen, UA: 0.2

## 2011-07-25 LAB — HCG, QUANTITATIVE, PREGNANCY: hCG, Beta Chain, Quant, S: 18279 — ABNORMAL HIGH

## 2011-07-25 LAB — WET PREP, GENITAL: WBC, Wet Prep HPF POC: NONE SEEN

## 2011-12-16 ENCOUNTER — Emergency Department (HOSPITAL_COMMUNITY): Payer: Medicaid Other

## 2011-12-16 ENCOUNTER — Other Ambulatory Visit: Payer: Self-pay

## 2011-12-16 ENCOUNTER — Emergency Department (HOSPITAL_COMMUNITY)
Admission: EM | Admit: 2011-12-16 | Discharge: 2011-12-16 | Disposition: A | Discharge status: Home Health | Payer: Medicaid Other | Attending: Emergency Medicine | Admitting: Emergency Medicine

## 2011-12-16 ENCOUNTER — Encounter (HOSPITAL_COMMUNITY): Payer: Self-pay

## 2011-12-16 DIAGNOSIS — I4891 Unspecified atrial fibrillation: Secondary | ICD-10-CM | POA: Insufficient documentation

## 2011-12-16 DIAGNOSIS — R079 Chest pain, unspecified: Secondary | ICD-10-CM | POA: Insufficient documentation

## 2011-12-16 DIAGNOSIS — R10811 Right upper quadrant abdominal tenderness: Secondary | ICD-10-CM | POA: Insufficient documentation

## 2011-12-16 DIAGNOSIS — R109 Unspecified abdominal pain: Secondary | ICD-10-CM | POA: Insufficient documentation

## 2011-12-16 DIAGNOSIS — R1110 Vomiting, unspecified: Secondary | ICD-10-CM | POA: Insufficient documentation

## 2011-12-16 HISTORY — DX: Nonrheumatic mitral (valve) prolapse: I34.1

## 2011-12-16 HISTORY — DX: Palpitations: R00.2

## 2011-12-16 LAB — CBC
HCT: 27.8 % — ABNORMAL LOW (ref 36.0–46.0)
Hemoglobin: 8.8 g/dL — ABNORMAL LOW (ref 12.0–15.0)
MCH: 24.3 pg — ABNORMAL LOW (ref 26.0–34.0)
MCHC: 31.7 g/dL (ref 30.0–36.0)
MCV: 76.8 fL — ABNORMAL LOW (ref 78.0–100.0)
Platelets: 269 10*3/uL (ref 150–400)
RBC: 3.62 MIL/uL — ABNORMAL LOW (ref 3.87–5.11)
RDW: 16.5 % — ABNORMAL HIGH (ref 11.5–15.5)
WBC: 9.1 10*3/uL (ref 4.0–10.5)

## 2011-12-16 LAB — COMPREHENSIVE METABOLIC PANEL
ALT: 16 U/L (ref 0–35)
AST: 15 U/L (ref 0–37)
Albumin: 3.6 g/dL (ref 3.5–5.2)
Alkaline Phosphatase: 58 U/L (ref 39–117)
BUN: 9 mg/dL (ref 6–23)
CO2: 25 mEq/L (ref 19–32)
Calcium: 8.6 mg/dL (ref 8.4–10.5)
Chloride: 107 mEq/L (ref 96–112)
Creatinine, Ser: 0.69 mg/dL (ref 0.50–1.10)
GFR calc Af Amer: >90 mL/min (ref >90)
GFR calc non Af Amer: >90 mL/min (ref >90)
Glucose, Bld: 93 mg/dL (ref 70–99)
Potassium: 3.3 mEq/L — ABNORMAL LOW (ref 3.5–5.1)
Sodium: 142 mEq/L (ref 135–145)
Total Bilirubin: 0.4 mg/dL (ref 0.3–1.2)
Total Protein: 6.5 g/dL (ref 6.0–8.3)

## 2011-12-16 LAB — URINALYSIS, ROUTINE W REFLEX MICROSCOPIC
Bilirubin Urine: NEGATIVE
Glucose, UA: NEGATIVE mg/dL
Ketones, ur: 15 mg/dL — AB
Leukocytes, UA: NEGATIVE
Nitrite: NEGATIVE
Protein, ur: NEGATIVE mg/dL
Specific Gravity, Urine: 1.011 (ref 1.005–1.030)
Urobilinogen, UA: 1 mg/dL (ref 0.0–1.0)
pH: 6.5 (ref 5.0–8.0)

## 2011-12-16 LAB — LIPASE, BLOOD: Lipase: 14 U/L (ref 11–59)

## 2011-12-16 LAB — DIFFERENTIAL
Basophils Absolute: 0 10*3/uL (ref 0.0–0.1)
Basophils Relative: 0 % (ref 0–1)
Eosinophils Absolute: 0.1 10*3/uL (ref 0.0–0.7)
Eosinophils Relative: 1 % (ref 0–5)
Lymphocytes Relative: 14 % (ref 12–46)
Lymphs Abs: 1.3 10*3/uL (ref 0.7–4.0)
Monocytes Absolute: 0.5 10*3/uL (ref 0.1–1.0)
Monocytes Relative: 5 % (ref 3–12)
Neutro Abs: 7.3 10*3/uL (ref 1.7–7.7)
Neutrophils Relative %: 79 % — ABNORMAL HIGH (ref 43–77)

## 2011-12-16 LAB — URINE MICROSCOPIC-ADD ON

## 2011-12-16 LAB — TROPONIN I
Troponin I: <0.3 ng/mL (ref <0.30)
Troponin I: <0.3 ng/mL (ref <0.30)

## 2011-12-16 MED ORDER — FENTANYL CITRATE 0.05 MG/ML IJ SOLN
100.0000 ug | Freq: Once | INTRAMUSCULAR | Status: AC
  Administered 2011-12-16: 100 ug via INTRAVENOUS
  Filled 2011-12-16: qty 2

## 2011-12-16 MED ORDER — PERCOCET 5-325 MG PO TABS: 1.0000 | ORAL_TABLET | Freq: Four times a day (QID) | ORAL | Status: DC | PRN

## 2011-12-16 MED ORDER — ONDANSETRON HCL 4 MG/2ML IJ SOLN
4.0000 mg | Freq: Once | INTRAMUSCULAR | Status: AC
  Administered 2011-12-16: 4 mg via INTRAVENOUS
  Filled 2011-12-16: qty 2

## 2011-12-16 MED ORDER — SODIUM CHLORIDE 0.9 % IV BOLUS (SEPSIS)
1000.0000 mL | Freq: Once | INTRAVENOUS | Status: AC
  Administered 2011-12-16: 1000 mL via INTRAVENOUS

## 2011-12-16 MED ORDER — HYDROMORPHONE HCL PF 1 MG/ML IJ SOLN
1.0000 mg | Freq: Once | INTRAMUSCULAR | Status: AC
  Administered 2011-12-16: 1 mg via INTRAVENOUS
  Filled 2011-12-16: qty 1

## 2011-12-16 MED ORDER — PROMETHAZINE HCL 25 MG PO TABS: 25.0000 mg | ORAL_TABLET | Freq: Four times a day (QID) | ORAL | Status: DC | PRN

## 2011-12-16 MED ORDER — MORPHINE SULFATE 4 MG/ML IJ SOLN: 4.0000 mg | Freq: Once | INTRAMUSCULAR | Status: DC

## 2011-12-16 NOTE — ED Notes (Signed)
Patient transported to X-ray 

## 2011-12-16 NOTE — ED Notes (Signed)
Pt. Arrived with a headache and abdominal pain along with the chest pain.  The headache and abdominal pain began last night with the chest pain

## 2011-12-16 NOTE — ED Notes (Signed)
Patient transported to X-ray. Will collect urine when patient returns

## 2011-12-16 NOTE — ED Provider Notes (Signed)
History     CSN: [id number]  Arrival date & time 12/16/11  1030   First MD Initiated Contact with Patient 12/16/11 1038      Chief Complaint  Patient presents with  . Chest Pain    (Consider location/radiation/quality/duration/timing/severity/associated sxs/prior treatment) HPI Patient since the emergency department following chest and abdominal pain that began this morning.  She states she vomited twice and since had dry heaves.  Patient states she has had epigastric pain along with right upper quadrant pain as well.  Patient had workup for chest pain in the past which was negative.  Says she has had palpitations previously along with mitral valve prolapse.  Patient denies smoking at this time.  She states that she does not take any medications for high blood pressure or high cholesterol.  She states that the pain did not radiate anywhere.  Patient denies shortness of breath, fever, headache, visual changes, diarrhea, back pain, weakness or bloody stools. Past Medical History  Diagnosis Date  . Atrial fibrillation   . Mitral valve prolapse     History reviewed. No pertinent past surgical history.  History reviewed. No pertinent family history.  History  Substance Use Topics  . Smoking status: Never Smoker   . Smokeless tobacco: Not on file  . Alcohol Use: No    OB History    Grav Para Term Preterm Abortions TAB SAB Ect Mult Living                  Review of Systems All pertinent positives and negatives reviewed in the history of present illness  Allergies  Review of patient's allergies indicates no known allergies.  Home Medications  No current outpatient prescriptions on file.  BP 110/65  Pulse 64  Temp(Src) 98.3 F (36.8 C) (Oral)  Resp 14  SpO2 98%  LMP 12/16/2011  Physical Exam  Constitutional: She appears well-developed and well-nourished. No distress.  HENT:  Head: Normocephalic and atraumatic.  Neck: Normal range of motion. Neck supple. No  thyromegaly present.  Cardiovascular: Normal rate, regular rhythm and normal heart sounds.  Exam reveals no gallop and no friction rub.   No murmur heard. Pulmonary/Chest: Effort normal and breath sounds normal. No respiratory distress. She has no wheezes. She has no rales.  Abdominal: Soft. Normal appearance and bowel sounds are normal. She exhibits no distension. There is no hepatosplenomegaly. There is tenderness in the right upper quadrant. There is guarding. There is no rigidity, no rebound, no tenderness at McBurney's point and negative Murphy's sign.      ED Course  Procedures (including critical care time)  Labs Reviewed  CBC - Abnormal; Notable for the following:    RBC 3.62 (*)    Hemoglobin 8.8 (*)    HCT 27.8 (*)    MCV 76.8 (*)    MCH 24.3 (*)    RDW 16.5 (*)    All other components within normal limits  DIFFERENTIAL - Abnormal; Notable for the following:    Neutrophils Relative 79 (*)    All other components within normal limits  COMPREHENSIVE METABOLIC PANEL - Abnormal; Notable for the following:    Potassium 3.3 (*)    All other components within normal limits  URINALYSIS, ROUTINE W REFLEX MICROSCOPIC - Abnormal; Notable for the following:    APPearance CLOUDY (*)    Hgb urine dipstick LARGE (*)    Ketones, ur 15 (*)    All other components within normal limits  URINE MICROSCOPIC-ADD ON - Abnormal;  Notable for the following:    Squamous Epithelial / LPF FEW (*)    All other components within normal limits  LIPASE, BLOOD  TROPONIN I   Dg Chest 2 View  12/16/2011  *RADIOLOGY REPORT*  Clinical Data: Chest pain  CHEST - 2 VIEW  Comparison: 04/16/2011  Findings: Heart size and vascularity are normal.  Negative for heart failure.  The lungs are clear without infiltrate or effusion. No mass lesion.  IMPRESSION: No active cardiopulmonary disease.  Original Report Authenticated By: Camelia Phenes, M.D.     Patient will have abdominal ultrasound this most of her pain  seems to be located in her abdomen in the right upper quadrant.  The patients symptoms are more consistent with an abdominal etiology for chest pain seems atypical for cardiac disease based on her history of present illness physical exam findings.  Patient had a workup done back in July for cardiac disease and was found to be negative. Patient is advised to return here as needed for any worsening in her condition.  She is advised followup with her primary care Dr. for recheck.   MDM   Date: 12/16/2011  Rate: 86  Rhythm: normal sinus rhythm  QRS Axis: normal  Intervals: normal  ST/T Wave abnormalities: normal  Conduction Disutrbances:none  Narrative Interpretation:   Old EKG Reviewed: unchanged   MDM Reviewed: nursing note and vitals Interpretation: labs, ECG, ultrasound and x-ray              Carlyle Dolly, PA-C 12/16/11 1548

## 2011-12-16 NOTE — ED Provider Notes (Signed)
Medical screening examination/treatment/procedure(s) were conducted as a shared visit with non-physician practitioner(s) and myself.  I personally evaluated the patient during the encounter Patient with the complaint chest and abdominal pain today and vomiting. Patient has no risk factors for cardiac disease and had a negative Myoview less than a year ago. On exam now patient has significant right upper cautery pain with normal CMP. Abdominal ultrasound negative for cold symptomatology. No signs of hepatitis. X-ray negative for any lung pathology. Low suspicion for PE. Patient tolerating orals. Will have her follow up with her regular physician or return if symptoms worsen  Gwyneth Sprout, MD 12/16/11 1549

## 2011-12-16 NOTE — ED Notes (Signed)
EMS reports pt with CP started 2 hours ago, squeezing, 6/10,, nitro relieved to 4/10,

## 2011-12-16 NOTE — Discharge Instructions (Signed)
Your testing here today was normal.  Return if any worsening in your condition.  Followup with your primary care Dr. for recheck

## 2011-12-16 NOTE — ED Notes (Signed)
Pt. Is feeling much better, Nausea relieved and pain has decreased.

## 2011-12-19 ENCOUNTER — Encounter (HOSPITAL_COMMUNITY): Payer: Self-pay

## 2011-12-19 ENCOUNTER — Emergency Department (HOSPITAL_COMMUNITY)
Admission: EM | Admit: 2011-12-19 | Discharge: 2011-12-19 | Disposition: A | Discharge status: Home / Self Care | Payer: Medicaid Other | Attending: Emergency Medicine | Admitting: Emergency Medicine

## 2011-12-19 DIAGNOSIS — I059 Rheumatic mitral valve disease, unspecified: Secondary | ICD-10-CM | POA: Insufficient documentation

## 2011-12-19 DIAGNOSIS — Z86718 Personal history of other venous thrombosis and embolism: Secondary | ICD-10-CM | POA: Insufficient documentation

## 2011-12-19 DIAGNOSIS — M79609 Pain in unspecified limb: Secondary | ICD-10-CM

## 2011-12-19 DIAGNOSIS — R600 Localized edema: Secondary | ICD-10-CM

## 2011-12-19 DIAGNOSIS — M7989 Other specified soft tissue disorders: Secondary | ICD-10-CM

## 2011-12-19 DIAGNOSIS — R609 Edema, unspecified: Secondary | ICD-10-CM | POA: Insufficient documentation

## 2011-12-19 LAB — CBC
Platelets: 266 10*3/uL (ref 150–400)
RBC: 3.41 MIL/uL — ABNORMAL LOW (ref 3.87–5.11)
WBC: 5.1 10*3/uL (ref 4.0–10.5)

## 2011-12-19 LAB — POCT I-STAT, CHEM 8
BUN: 13 mg/dL (ref 6–23)
Chloride: 105 mEq/L (ref 96–112)
Potassium: 3.6 mEq/L (ref 3.5–5.1)
Sodium: 145 mEq/L (ref 135–145)

## 2011-12-19 MED ORDER — PERCOCET 5-325 MG PO TABS: 1.0000 | ORAL_TABLET | Freq: Four times a day (QID) | ORAL | Status: DC | PRN

## 2011-12-19 MED ORDER — OXYCODONE-ACETAMINOPHEN 5-325 MG PO TABS
1.0000 | ORAL_TABLET | Freq: Once | ORAL | Status: AC
  Administered 2011-12-19: 1 via ORAL
  Filled 2011-12-19: qty 1

## 2011-12-19 NOTE — Discharge Instructions (Signed)
Peripheral Edema You have swelling in your legs (peripheral edema). This swelling is due to excess accumulation of salt and water in your body. Edema may be a sign of heart, kidney or liver disease, or a side effect of a medication. It may also be due to problems in the leg veins. Elevating your legs and using special support stockings may be very helpful, if the cause of the swelling is due to poor venous circulation. Avoid long periods of standing, whatever the cause. Treatment of edema depends on identifying the cause. Chips, pretzels, pickles and other salty foods should be avoided. Restricting salt in your diet is almost always needed. Water pills (diuretics) are often used to remove the excess salt and water from your body via urine. These medicines prevent the kidney from reabsorbing sodium. This increases urine flow. Diuretic treatment may also result in lowering of potassium levels in your body. Potassium supplements may be needed if you have to use diuretics daily. Daily weights can help you keep track of your progress in clearing your edema. You should call your caregiver for follow up care as recommended. SEEK IMMEDIATE MEDICAL CARE IF:   You have increased swelling, pain, redness, or heat in your legs.   You develop shortness of breath, especially when lying down.   You develop chest or abdominal pain, weakness, or fainting.   You have a fever.  Document Released: 11/03/2004 Document Revised: 09/15/2011 Document Reviewed: 10/14/2009 [employer] Patient Information 2012 [employer]. 

## 2011-12-19 NOTE — ED Provider Notes (Deleted)
  Physical Exam  BP 122/69  Pulse 79  Temp(Src) 99 F (37.2 C) (Oral)  Resp 16  Ht 5\' 8"  (1.727 m)  Wt 200 lb (90.719 kg)  BMI 30.41 kg/m2  SpO2 98%  LMP 12/16/2011  Physical Exam  Mild bilateral lower extremity edema with some pitting. ED Course  Procedures  MDM Patient with swelling in her bilateral lower extremities. She was here for chest pain on Saturday. She states the swelling began to worse after that. No clear cause was found on Saturday. She states she had a previous superficial thrombosis. She was moved to an exam room for further evaluation      Harrold Donath R. Rubin Payor, MD 12/19/11 1109

## 2011-12-19 NOTE — ED Provider Notes (Signed)
Medical screening examination/treatment/procedure(s) were conducted as a shared visit with non-physician practitioner(s) and myself.  I personally evaluated the patient during the encounter  Christy Mayer. Rubin Payor, MD 12/19/11 2027

## 2011-12-19 NOTE — ED Provider Notes (Signed)
46 year old female who was brought to the CDU while awaiting the results of bilateral lower extremity Dopplers to evaluate for deep vein thrombosis. She was evaluated for chest and upper abdominal pain on the evening of 12/16/2011 and her leg swelling has worsened since that time. On my exam, this is alert, oriented, in no acute distress. Lungs are clear to auscultation bilaterally. Heart regular rate and rhythm. Trace pitting edema to bilateral lower extremities with mild tenderness to firm palpation of both thighs. [place] technologist note reports no DVT or superficial thrombosis to BLE. Pt will be d/c home. Advised f/u with PCP. Requests additional pain medication for BLE pain. Will give short pain rx course as she did receive some percocet 5/325 several days ago.  Shaaron Adler, [place] 12/19/11 1413

## 2011-12-19 NOTE — ED Notes (Signed)
Bilateral leg swelling began Saturday night.  Pt. Was here on Saturday for chest pain and leg swelling  Could not find anything wrong and was sent home.  As soon as she got home the swelling began.   Denies any chest pain or sob.

## 2011-12-19 NOTE — Progress Notes (Signed)
VASCULAR LAB PRELIMINARY  PRELIMINARY  PRELIMINARY  PRELIMINARY  Bilateral lower extremity venous duplex completed.    Preliminary report:Bilateral:  No evidence of DVT, superficial thrombosis, or Baker's Cyst.   Mort Smelser D, RVS 12/19/2011, 12:42 PM

## 2011-12-19 NOTE — ED Provider Notes (Signed)
History     CSN: [id number]  Arrival date & time 12/19/11  7829   First MD Initiated Contact with Patient 12/19/11 1109      Chief Complaint  Patient presents with  . Leg Swelling    (Consider location/radiation/quality/duration/timing/severity/associated sxs/prior treatment) HPI  Patient's age of swelling of her bilateral lower extremities. She was seen on Saturday for chest pain and leg swelling. She's rule out cardiac-wise at that time. No chest pain or shortness of breath this time. He states the leg swelling has increased since then. She states she has a previous history of a superficial thrombosis in right leg. No trauma. She states she's not had a DVT before.   Past Medical History  Diagnosis Date  . Mitral valve prolapse   . Heart palpitations     History reviewed. No pertinent past surgical history.  No family history on file.  History  Substance Use Topics  . Smoking status: Never Smoker   . Smokeless tobacco: Not on file  . Alcohol Use: No    OB History    Grav Para Term Preterm Abortions TAB SAB Ect Mult Living                  Review of Systems  Constitutional: Negative for activity change and appetite change.  HENT: Negative for neck stiffness.   Eyes: Negative for pain.  Respiratory: Negative for chest tightness and shortness of breath.   Cardiovascular: Positive for leg swelling. Negative for chest pain.  Gastrointestinal: Negative for nausea, vomiting, abdominal pain and diarrhea.  Genitourinary: Negative for flank pain.  Musculoskeletal: Negative for back pain.  Skin: Negative for rash.  Neurological: Negative for weakness, numbness and headaches.  Psychiatric/Behavioral: Negative for behavioral problems.    Allergies  Review of patient's allergies indicates no known allergies.  Home Medications   Current Outpatient Rx  Name Route Sig Dispense Refill  . PERCOCET 5-325 MG PO TABS Oral Take 1 tablet by mouth every 6 (six) hours as needed  for pain. 12 tablet 0    Dispense as written.    BP 107/68  Pulse 61  Temp(Src) 99 F (37.2 C) (Oral)  Resp 16  Ht 5\' 8"  (1.727 m)  Wt 200 lb (90.719 kg)  BMI 30.41 kg/m2  SpO2 97%  LMP 12/16/2011  Physical Exam  Nursing note and vitals reviewed. Constitutional: She is oriented to person, place, and time. She appears well-developed and well-nourished.  HENT:  Head: Normocephalic and atraumatic.  Eyes: EOM are normal. Pupils are equal, round, and reactive to light.  Neck: Normal range of motion. Neck supple.  Cardiovascular: Normal rate, regular rhythm and normal heart sounds.   No murmur heard. Pulmonary/Chest: Effort normal and breath sounds normal. No respiratory distress. She has no wheezes. She has no rales.  Abdominal: Soft. Bowel sounds are normal. She exhibits no distension. There is no tenderness. There is no rebound and no guarding.  Musculoskeletal: Normal range of motion. She exhibits edema.       Bilateral lower extremity edema. Minimal pitting to it.  Neurological: She is alert and oriented to person, place, and time. No cranial nerve deficit.  Skin: Skin is warm and dry.  Psychiatric: She has a normal mood and affect. Her speech is normal.    ED Course  Procedures (including critical care time)  Labs Reviewed  CBC - Abnormal; Notable for the following:    RBC 3.41 (*)    Hemoglobin 8.2 (*)    HCT  26.4 (*)    MCV 77.4 (*)    MCH 24.0 (*)    RDW 16.9 (*)    All other components within normal limits  POCT I-STAT, CHEM 8 - Abnormal; Notable for the following:    Hemoglobin 9.2 (*)    HCT 27.0 (*)    All other components within normal limits  PREGNANCY, URINE   No results found.   1. Bilateral lower extremity edema       MDM  Lower extremity edema. Possible previous thrombosis. Ultrasound is negative. She's to CDU for further evaluation. She does have a worsening anemia, which will need to be followed by her OB/GYN. She does have a history of heavy  periods. No melena.        Juliet Rude. Rubin Payor, MD 12/19/11 2026

## 2011-12-25 ENCOUNTER — Emergency Department (HOSPITAL_COMMUNITY)
Admission: EM | Admit: 2011-12-25 | Discharge: 2011-12-25 | Disposition: A | Discharge status: Home / Self Care | Payer: Medicaid Other | Attending: Emergency Medicine | Admitting: Emergency Medicine

## 2011-12-25 ENCOUNTER — Emergency Department (HOSPITAL_COMMUNITY): Payer: Medicaid Other

## 2011-12-25 ENCOUNTER — Encounter (HOSPITAL_COMMUNITY): Payer: Self-pay | Admitting: Emergency Medicine

## 2011-12-25 DIAGNOSIS — I059 Rheumatic mitral valve disease, unspecified: Secondary | ICD-10-CM | POA: Insufficient documentation

## 2011-12-25 DIAGNOSIS — R110 Nausea: Secondary | ICD-10-CM | POA: Insufficient documentation

## 2011-12-25 DIAGNOSIS — R609 Edema, unspecified: Secondary | ICD-10-CM | POA: Insufficient documentation

## 2011-12-25 DIAGNOSIS — J40 Bronchitis, not specified as acute or chronic: Secondary | ICD-10-CM

## 2011-12-25 DIAGNOSIS — F17200 Nicotine dependence, unspecified, uncomplicated: Secondary | ICD-10-CM | POA: Insufficient documentation

## 2011-12-25 HISTORY — DX: Dorsalgia, unspecified: M54.9

## 2011-12-25 HISTORY — DX: Unspecified osteoarthritis, unspecified site: M19.90

## 2011-12-25 LAB — POCT PREGNANCY, URINE: Preg Test, Ur: NEGATIVE

## 2011-12-25 MED ORDER — AEROCHAMBER PLUS W/MASK MISC
Status: AC
  Administered 2011-12-25: 14:00:00
  Filled 2011-12-25: qty 1

## 2011-12-25 MED ORDER — GUAIFENESIN-CODEINE 100-10 MG/5ML PO SOLN
5.0000 mL | Freq: Once | ORAL | Status: AC
  Administered 2011-12-25: 5 mL via ORAL
  Filled 2011-12-25: qty 5

## 2011-12-25 MED ORDER — ALBUTEROL SULFATE HFA 108 (90 BASE) MCG/ACT IN AERS
2.0000 | INHALATION_SPRAY | RESPIRATORY_TRACT | Status: DC
  Administered 2011-12-25: 2 via RESPIRATORY_TRACT
  Filled 2011-12-25: qty 6.7

## 2011-12-25 MED ORDER — HYDROCODONE-HOMATROPINE 5-1.5 MG/5ML PO SYRP: 5.0000 mL | ORAL_SOLUTION | Freq: Four times a day (QID) | ORAL | Status: DC | PRN

## 2011-12-25 NOTE — ED Provider Notes (Signed)
Medical screening examination/treatment/procedure(s) were performed by non-physician practitioner and as supervising physician I was immediately available for consultation/collaboration.   Devonne Lalani, MD 12/25/11 2127 

## 2011-12-25 NOTE — ED Provider Notes (Signed)
History     CSN: [id number]  Arrival date & time 12/25/11  1050   First MD Initiated Contact with Patient 12/25/11 1119      Chief Complaint  Patient presents with  . URI    (Consider location/radiation/quality/duration/timing/severity/associated sxs/prior treatment) HPI 46 year old female with past of medical history mitral valve prolapse presents with cough which has been ongoing since Friday. She states the cough is nonproductive in nature. Denies associated hemoptysis. Has had some chest pain with coughing only, located in the center of her chest. States it feels like "my lungs are getting squeezed." Denies dyspnea or diaphoresis. Has had scant rhinorrhea and nausea as well. Denies abdominal pain, vomiting, diarrhea. Denies headache, sore throat, ear pain. Denies fever/chills.  Patient also states that she has had continued bilateral leg swelling since her last ED visit on March 11. States her swelling has been worsening since that time; seems to get worse throughout the day. She has been unable to wear normal shoes and states she feels as if it is difficult to bend her knees at the end of the day. She has been evaluated in the past with bilateral Dopplers of the legs to rule out DVT. Also states "they looked at my gallbladder as well and checked a pregnancy test." She states that she is concerned that she might be pregnant, as she had similar leg swelling with her previous pregnancies. Her last menstrual period was March 8, and states this was slightly different than her typical menses. It did not last as long and she seemed to have more coughing than usual. She has an upcoming appointment with her primary care physician assistant on March 25 for further evaluation of this leg swelling. Patient has no prior history of DVT/PE. Denies recent trauma, surgery, or prolonged immobilization. Denies hemoptysis, leg swelling, or use of exogenous estrogen.  Past Medical History  Diagnosis Date  .  Mitral valve prolapse   . Heart palpitations   . Arthritis   . Back pain     History reviewed. No pertinent past surgical history.  No family history on file.  History  Substance Use Topics  . Smoking status: Current Everyday Smoker -- 0.5 packs/day  . Smokeless tobacco: Not on file  . Alcohol Use: No    OB History    Grav Para Term Preterm Abortions TAB SAB Ect Mult Living                  Review of Systems  Constitutional: Negative for fever, chills, activity change and appetite change.  HENT: Positive for rhinorrhea. Negative for ear pain, sore throat, facial swelling, trouble swallowing, neck pain, neck stiffness and sinus pressure.   Eyes: Negative for discharge and redness.  Respiratory: Positive for cough. Negative for chest tightness and shortness of breath.   Cardiovascular: Positive for chest pain and leg swelling. Negative for palpitations.  Gastrointestinal: Positive for nausea. Negative for vomiting, abdominal pain and diarrhea.  Genitourinary: Negative for dysuria.  Musculoskeletal: Negative for myalgias.  Skin: Negative for color change and rash.  Neurological: Negative for dizziness, weakness and headaches.    Allergies  Review of patient's allergies indicates no known allergies.  Home Medications   Current Outpatient Rx  Name Route Sig Dispense Refill  . HYDROCHLOROTHIAZIDE 12.5 MG PO CAPS Oral Take 12.5 mg by mouth daily.      BP 130/77  Pulse 96  Temp(Src) 98.6 F (37 C) (Oral)  Resp 20  SpO2 99%  LMP 12/16/2011  Physical Exam  Nursing note and vitals reviewed. Constitutional: She appears well-developed and well-nourished. No distress.       Pt with frequent, congested sounding cough  HENT:  Head: Normocephalic and atraumatic.  Right Ear: External ear normal.  Left Ear: External ear normal.  Mouth/Throat: Oropharynx is clear and moist. No oropharyngeal exudate.       TMs nl b/l Sinuses nontender to palp  Eyes: Conjunctivae and EOM  are normal. Pupils are equal, round, and reactive to light.  Neck: Normal range of motion. Neck supple.  Cardiovascular: Normal rate, regular rhythm and normal heart sounds.        Trace b/l LE edema, nonpitting  Pulmonary/Chest: Effort normal. She exhibits no tenderness.       Scattered rhonchi which clear with cough  Abdominal: Soft. Bowel sounds are normal. There is no tenderness. There is no rebound and no guarding.  Lymphadenopathy:    She has no cervical adenopathy.  Neurological: She is alert.  Skin: Skin is warm and dry. No rash noted. She is not diaphoretic.  Psychiatric: She has a normal mood and affect.    ED Course  Procedures (including critical care time)   Labs Reviewed  POCT PREGNANCY, URINE   Dg Chest 2 View  12/25/2011  *RADIOLOGY REPORT*  Clinical Data: Cough  CHEST - 2 VIEW  Comparison: 12/16/2011  Findings: Cardiomediastinal silhouette is stable.  No acute infiltrate or pleural effusion.  No pulmonary edema.  Bony thorax is stable.  IMPRESSION: No active disease.  No significant change.  Original Report Authenticated By: Natasha Mead, M.D.   I personally reviewed the x-rays.   1. Bronchitis       MDM  Patient with coughing for the past 2 days with minor rhinorrhea. No hemoptysis. Bilateral minor edema of legs which has been present for some time. Negative Dopplers on recent ED visit. She is not tachycardic or tachypneic and has not had any hemoptysis. Not on any exogenous estrogen; no other risk factors for PE. Given the nature of her cough and her symptoms, I strongly suspect that this is infectious in nature. Will treat symptomatically with albuterol, antitussive. Pt strongly encouraged to keep f/u with PCP. Return precautions discussed.       Grant Fontana, [place] 12/25/11 1327

## 2011-12-25 NOTE — ED Notes (Addendum)
Pt. Stated, I've had a really bad congestion with a bad cough since Friday.

## 2011-12-25 NOTE — Discharge Instructions (Signed)
Your chest x-ray did not show signs of pneumonia. This appears likely consistent with bronchitis. This is typically caused by virus and cannot be treated with antibiotics. You've been given an inhaler here. Please take 2 puffs every 4 hours for the next 24-48 hours, then every 4 hours as needed. Use the cough syrup as prescribed. You may take ibuprofen as needed for body aches. Please keep your upcoming appointment with your primary care provider. If you're having shortness of breath, worsening chest pain, high fever not controlled by medication, or otherwise worsening condition, please return to the ED.  RESOURCE GUIDE  Dental Problems  Patients with Medicaid: [facility] [phone] [address]                                           [phone] [address] Phone:  [phone]                                                  Phone:  [phone]  If unable to pay or uninsured, contact:  [facility] or [facility]. to become qualified for the adult dental clinic.  Chronic Pain Problems Contact [facility]  [phone] Patients need to be referred by their primary care doctor.  Insufficient Money for Medicine Contact [employer]:  call "211" or [facility] [phone]) 216-4783.  No Primary Care Doctor Call [facility]  [phone] Other agencies that provide inexpensive medical care    [facility]  [phone]    [facility]  [phone]    [facility]  [phone]    [facility]  [phone]    [facility]  [phone]    [facility]  [phone]  Psychological Services [facility]  [phone] [facility]  [phone] [facility]   [phone] (emergency services [phone])  Substance Abuse Resources [facility]  [phone] [facility] [phone] [facility] [place] [phone] Floydene Flock  [phone] Residential & Outpatient Substance Abuse Program  [phone]  Abuse/Neglect [facility] Child Abuse Hotline 9180836397 [facility] Child Abuse Hotline 769-565-3113 (After Hours)  Emergency Shelter [facility] [phone]  Maternity Homes [facility] at the [place] [facility] [place] [phone] [facility] [phone]  MRSA Hotline #:   (616) 100-9340    [facility]  [facility]     [employer]                          [facility] Dept. [address] [place]                       [address]      [address]  [address]  Cristobal Goldmann Phone:  [phone]                                   Phone:  [phone]                 Phone:  [phone]  [facility] Phone:  [phone]  [facility] Child Abuse Hotline 814-238-7807 [phone] (After Hours)  Bronchitis Bronchitis is the body's way of reacting to injury and/or infection (inflammation) of the bronchi. Bronchi are the air tubes that extend from the windpipe into the lungs. If the inflammation becomes severe, it may cause shortness of breath. CAUSES  Inflammation may be caused by:  A virus.   Germs (bacteria).   Dust.   Allergens.   Pollutants and many other irritants.  The cells lining the bronchial tree are covered with tiny hairs (cilia). These constantly beat upward, away from the lungs, toward the mouth. This keeps the lungs free of pollutants. When these cells become too irritated and are unable to do their job, mucus begins to develop. This causes the characteristic cough of bronchitis. The cough clears the lungs when the cilia are unable to do their job. Without either of these protective mechanisms, the mucus would settle in the lungs. Then you would develop pneumonia. Smoking is a common cause of  bronchitis and can contribute to pneumonia. Stopping this habit is the single most important thing you can do to help yourself. TREATMENT   Your caregiver may prescribe an antibiotic if the cough is caused by bacteria. Also, medicines that open up your airways make it easier to breathe. Your caregiver may also recommend or prescribe an expectorant. It will loosen the mucus to be coughed up. Only take over-the-counter or prescription medicines for pain, discomfort, or fever as directed by your caregiver.   Removing whatever causes the problem (smoking, for example) is critical to preventing the problem from getting worse.   Cough suppressants may be prescribed for relief of cough symptoms.   Inhaled medicines may be prescribed to help with symptoms now and to help prevent problems from returning.   For those with recurrent (chronic) bronchitis, there may be a need for steroid medicines.  SEEK IMMEDIATE MEDICAL CARE IF:   During treatment, you develop more pus-like mucus (purulent sputum).   You have a fever.   Your baby is older than 3 months with a rectal temperature of 102 F (38.9 C) or higher.   Your baby is 40 months old or younger with a rectal temperature of 100.4 F (38 C) or higher.   You become progressively more ill.   You have increased difficulty breathing, wheezing, or shortness of breath.  It is necessary to seek immediate medical care if you are elderly or sick from any other disease. MAKE SURE YOU:   Understand these instructions.   Will watch your condition.   Will get help right away if you are not doing well or get worse.  Document Released: 09/26/2005 Document Revised: 09/15/2011 Document Reviewed: 08/05/2008 [facility] Patient Information 2012 [place], [place].

## 2011-12-30 ENCOUNTER — Encounter (HOSPITAL_COMMUNITY): Payer: Self-pay

## 2011-12-30 ENCOUNTER — Emergency Department (HOSPITAL_COMMUNITY)
Admission: EM | Admit: 2011-12-30 | Discharge: 2011-12-30 | Disposition: A | Discharge status: Home / Self Care | Payer: Medicaid Other | Attending: Emergency Medicine | Admitting: Emergency Medicine

## 2011-12-30 ENCOUNTER — Emergency Department (HOSPITAL_COMMUNITY): Payer: Medicaid Other

## 2011-12-30 DIAGNOSIS — Z5900 Homelessness unspecified: Secondary | ICD-10-CM | POA: Insufficient documentation

## 2011-12-30 DIAGNOSIS — M129 Arthropathy, unspecified: Secondary | ICD-10-CM | POA: Insufficient documentation

## 2011-12-30 DIAGNOSIS — R0989 Other specified symptoms and signs involving the circulatory and respiratory systems: Secondary | ICD-10-CM | POA: Insufficient documentation

## 2011-12-30 DIAGNOSIS — J189 Pneumonia, unspecified organism: Secondary | ICD-10-CM | POA: Insufficient documentation

## 2011-12-30 DIAGNOSIS — R05 Cough: Secondary | ICD-10-CM | POA: Insufficient documentation

## 2011-12-30 DIAGNOSIS — R0602 Shortness of breath: Secondary | ICD-10-CM | POA: Insufficient documentation

## 2011-12-30 DIAGNOSIS — F17200 Nicotine dependence, unspecified, uncomplicated: Secondary | ICD-10-CM | POA: Insufficient documentation

## 2011-12-30 DIAGNOSIS — R011 Cardiac murmur, unspecified: Secondary | ICD-10-CM | POA: Insufficient documentation

## 2011-12-30 DIAGNOSIS — R059 Cough, unspecified: Secondary | ICD-10-CM | POA: Insufficient documentation

## 2011-12-30 DIAGNOSIS — R509 Fever, unspecified: Secondary | ICD-10-CM | POA: Insufficient documentation

## 2011-12-30 DIAGNOSIS — R071 Chest pain on breathing: Secondary | ICD-10-CM | POA: Insufficient documentation

## 2011-12-30 LAB — URINALYSIS, ROUTINE W REFLEX MICROSCOPIC
Bilirubin Urine: NEGATIVE
Glucose, UA: NEGATIVE mg/dL
Hgb urine dipstick: NEGATIVE
Ketones, ur: NEGATIVE mg/dL
Leukocytes, UA: NEGATIVE
Nitrite: NEGATIVE
Protein, ur: NEGATIVE mg/dL
Specific Gravity, Urine: 1.02 (ref 1.005–1.030)
Urobilinogen, UA: 0.2 mg/dL (ref 0.0–1.0)
pH: 6 (ref 5.0–8.0)

## 2011-12-30 LAB — D-DIMER, QUANTITATIVE: D-Dimer, Quant: 0.83 ug/mL-FEU — ABNORMAL HIGH (ref 0.00–0.48)

## 2011-12-30 MED ORDER — MOXIFLOXACIN HCL 400 MG PO TABS
400.0000 mg | ORAL_TABLET | Freq: Once | ORAL | Status: AC
  Administered 2011-12-30: 400 mg via ORAL
  Filled 2011-12-30: qty 1

## 2011-12-30 MED ORDER — HYDROCOD POLST-CHLORPHEN POLST 10-8 MG/5ML PO LQCR
5.0000 mL | Freq: Once | ORAL | Status: AC
  Administered 2011-12-30: 5 mL via ORAL
  Filled 2011-12-30: qty 5

## 2011-12-30 MED ORDER — ALBUTEROL SULFATE HFA 108 (90 BASE) MCG/ACT IN AERS
2.0000 | INHALATION_SPRAY | RESPIRATORY_TRACT | Status: DC | PRN
  Administered 2011-12-30: 2 via RESPIRATORY_TRACT
  Filled 2011-12-30: qty 6.7

## 2011-12-30 MED ORDER — ACETAMINOPHEN 325 MG PO TABS
650.0000 mg | ORAL_TABLET | Freq: Once | ORAL | Status: AC
  Administered 2011-12-30: 650 mg via ORAL
  Filled 2011-12-30: qty 2

## 2011-12-30 MED ORDER — AZITHROMYCIN 250 MG PO TABS: 500.0000 mg | ORAL_TABLET | Freq: Every day | ORAL | Status: DC

## 2011-12-30 MED ORDER — HYDROCOD POLST-CHLORPHEN POLST 10-8 MG/5ML PO LQCR: 5.0000 mL | Freq: Two times a day (BID) | ORAL | Status: DC | PRN

## 2011-12-30 NOTE — Discharge Instructions (Signed)

## 2011-12-30 NOTE — ED Provider Notes (Signed)
History     CSN: [id number]  Arrival date & time 12/30/11  1478   First MD Initiated Contact with Patient 12/30/11 [phone]      Chief Complaint  Patient presents with  . Cough    (Consider location/radiation/quality/duration/timing/severity/associated sxs/prior treatment) HPI   46 year old female presents with persistent cough. Patient states for the past 2 weeks has been having a gradual onset of nonproductive cough, fever, chills, and pleuritic chest pain. Cough is getting increasingly worse. She is having chest congestion and unable to produce any sputum. Cough is persistence with a barking quality. She is a smoker. She denies nausea, vomiting, diarrhea, abdominal pain. She has been seen and evaluate this for the symptoms recently and was prescribed complication and albuterol inhaler. States the cough medication has not helped. She has finished using the albuterol inhaler due to increased shortness of breath. She also notice bilateral lower extremity swelling which comes and goes. States symptoms worsen when she lies flat. She does not use oxygen at home.  Denies hemoptysis, or weight changes.  Lives at a shelter.  Past Medical History  Diagnosis Date  . Mitral valve prolapse   . Heart palpitations   . Arthritis   . Back pain     History reviewed. No pertinent past surgical history.  History reviewed. No pertinent family history.  History  Substance Use Topics  . Smoking status: Current Everyday Smoker -- 0.5 packs/day  . Smokeless tobacco: Not on file  . Alcohol Use: No    OB History    Grav Para Term Preterm Abortions TAB SAB Ect Mult Living                  Review of Systems  All other systems reviewed and are negative.    Allergies  Review of patient's allergies indicates no known allergies.  Home Medications   Current Outpatient Rx  Name Route Sig Dispense Refill  . ALBUTEROL SULFATE HFA 108 (90 BASE) MCG/ACT IN AERS Inhalation Inhale 2 puffs into the  lungs every 6 (six) hours as needed. Shortness of breath    . HYDROCODONE-HOMATROPINE 5-1.5 MG/5ML PO SYRP Oral Take 5 mLs by mouth every 6 (six) hours as needed for cough. 120 mL 0    BP 120/68  Pulse 88  Temp(Src) 99.6 F (37.6 C) (Oral)  Resp 16  SpO2 98%  LMP 12/16/2011  Physical Exam  Constitutional: She appears well-developed and well-nourished. No distress.  HENT:  Head: Atraumatic.  Right Ear: External ear normal.  Left Ear: External ear normal.  Nose: Nose normal.  Mouth/Throat: Oropharynx is clear and moist. No oropharyngeal exudate.  Eyes: Conjunctivae are normal. No scleral icterus.  Neck: Normal range of motion. Neck supple.  Cardiovascular: Normal rate.  Exam reveals no gallop and no friction rub.   Murmur heard. Pulmonary/Chest: Effort normal. No respiratory distress. She has rales. She exhibits tenderness.       Rales can be heard to right lower lobe. Expiratory rhonchi is noted.  Abdominal: Soft.  Musculoskeletal: Normal range of motion.  Lymphadenopathy:    She has no cervical adenopathy.  Neurological: She is alert.  Skin: Skin is warm.    ED Course  Procedures (including critical care time)  Labs Reviewed  URINALYSIS, ROUTINE W REFLEX MICROSCOPIC - Abnormal; Notable for the following:    APPearance HAZY (*)    All other components within normal limits   No results found.   No diagnosis found.  Results for orders placed during  the hospital encounter of 12/30/11  URINALYSIS, ROUTINE W REFLEX MICROSCOPIC      Component Value Range   Color, Urine YELLOW  YELLOW    APPearance HAZY (*) CLEAR    Specific Gravity, Urine 1.020  1.005 - 1.030    pH 6.0  5.0 - 8.0    Glucose, UA NEGATIVE  NEGATIVE (mg/dL)   Hgb urine dipstick NEGATIVE  NEGATIVE    Bilirubin Urine NEGATIVE  NEGATIVE    Ketones, ur NEGATIVE  NEGATIVE (mg/dL)   Protein, ur NEGATIVE  NEGATIVE (mg/dL)   Urobilinogen, UA 0.2  0.0 - 1.0 (mg/dL)   Nitrite NEGATIVE  NEGATIVE    Leukocytes,  UA NEGATIVE  NEGATIVE   D-DIMER, QUANTITATIVE      Component Value Range   D-Dimer, Quant 0.83 (*) 0.00 - 0.48 (ug/mL-FEU)   Dg Chest 2 View  12/30/2011  *RADIOLOGY REPORT*  Clinical Data: Cough, congestion, short of breath, smoking history  CHEST - 2 VIEW  Comparison: Chest x-ray of 12/25/2011.  Findings: There is parenchymal opacity at both lung bases suspicious for pneumonia.  No effusion is seen.  Mild peribronchial thickening is noted.  The heart is within upper limits normal.  No bony abnormality is seen  IMPRESSION: Patchy opacities at both lung bases most consistent with pneumonia.  Original Report Authenticated By: Juline Patch, M.D.       MDM  Persistent cough for the past several weeks with decreased breath sounds to right lower lung base. History of heart palpitations and mitral valve prolapse, I have some suspicion of PE.  Will obtain D-dimer and chest xray.    11:52 AM CXR reviewed by me shows evidence of pneumonia, which is consistent with pt's presenting complaint.  D-dimer is mildly elevated at 0.83.  However, due to the duration of pt's symptoms, a normal HR, and pneumonia found on CRX my suspicion for PE is very low. Pt is able to tolerates PO.  She has no prior hospitalization. Low suspicion for TB. I will prescribe Avelox here in ED. Pt is homeless, therefore i will contact social worker to obtain Zpak abx for 4 days. Albuterol inhaler given.  Strict f/u instruction given.  Pt recommend to return to ER in 2 days for reevaluation.  Pt voice understanding, and agrees with plan.      Fayrene Helper, PA-C 12/30/11 1217

## 2011-12-30 NOTE — ED Notes (Signed)
Case manager down to get pt. Her medication.  Lunch ordered for pt. And pt.s family

## 2011-12-30 NOTE — ED Notes (Signed)
Pt states that for the past 3 weeks she has been having a cough that has been getting worse. Last night she states that she had a high fever. She was here on Sunday and was sent out with cough med and inhaler. Her cough is deep and barky. She can feel things moving about in her chest but cannot cough it up. She feels short of breath at times.

## 2011-12-30 NOTE — ED Provider Notes (Signed)
Medical screening examination/treatment/procedure(s) were performed by non-physician practitioner and as supervising physician I was immediately available for consultation/collaboration.   Christy Mayer. Davielle Lingelbach, MD 12/30/11 1218

## 2012-01-02 ENCOUNTER — Emergency Department (HOSPITAL_COMMUNITY)
Admission: EM | Admit: 2012-01-02 | Discharge: 2012-01-02 | Disposition: A | Discharge status: Home / Self Care | Payer: Medicaid Other | Attending: Emergency Medicine | Admitting: Emergency Medicine

## 2012-01-02 ENCOUNTER — Emergency Department (HOSPITAL_COMMUNITY): Payer: Medicaid Other

## 2012-01-02 ENCOUNTER — Encounter (HOSPITAL_COMMUNITY): Payer: Self-pay | Admitting: *Deleted

## 2012-01-02 DIAGNOSIS — R059 Cough, unspecified: Secondary | ICD-10-CM | POA: Insufficient documentation

## 2012-01-02 DIAGNOSIS — F17200 Nicotine dependence, unspecified, uncomplicated: Secondary | ICD-10-CM | POA: Insufficient documentation

## 2012-01-02 DIAGNOSIS — J189 Pneumonia, unspecified organism: Secondary | ICD-10-CM | POA: Insufficient documentation

## 2012-01-02 DIAGNOSIS — R05 Cough: Secondary | ICD-10-CM | POA: Insufficient documentation

## 2012-01-02 MED ORDER — IPRATROPIUM BROMIDE 0.02 % IN SOLN
RESPIRATORY_TRACT | Status: AC
  Filled 2012-01-02: qty 2.5

## 2012-01-02 MED ORDER — GUAIFENESIN-CODEINE 100-10 MG/5ML PO SOLN
5.0000 mL | Freq: Once | ORAL | Status: AC
  Administered 2012-01-02: 5 mL via ORAL
  Filled 2012-01-02: qty 5

## 2012-01-02 MED ORDER — IPRATROPIUM BROMIDE 0.02 % IN SOLN
0.5000 mg | Freq: Once | RESPIRATORY_TRACT | Status: AC
  Administered 2012-01-02: 0.5 mg via RESPIRATORY_TRACT
  Filled 2012-01-02: qty 2.5

## 2012-01-02 MED ORDER — ALBUTEROL SULFATE (5 MG/ML) 0.5% IN NEBU
5.0000 mg | INHALATION_SOLUTION | Freq: Once | RESPIRATORY_TRACT | Status: AC
  Administered 2012-01-02: 5 mg via RESPIRATORY_TRACT
  Filled 2012-01-02: qty 1

## 2012-01-02 NOTE — ED Notes (Signed)
Patient states that she was started on antibiotics for pneumonia on Friday  Pt states that she hasn't had any improvement in symptoms at this time.  Pt is coughing at this time.  Pt states that she had had fever over the weekend.

## 2012-01-02 NOTE — ED Notes (Signed)
Patients cough has improved at this time.  Pt states that she is feeling better.

## 2012-01-02 NOTE — ED Notes (Signed)
Returned from radiology. 

## 2012-01-02 NOTE — Discharge Instructions (Signed)
Pneumonia, Adult Pneumonia is an infection of the lungs.  CAUSES Pneumonia may be caused by bacteria or a virus. Usually, these infections are caused by breathing infectious particles into the lungs (respiratory tract). SYMPTOMS   Cough.   Fever.   Chest pain.   Increased rate of breathing.   Wheezing.   Mucus production.  DIAGNOSIS  If you have the common symptoms of pneumonia, your caregiver will typically confirm the diagnosis with a chest X-ray. The X-ray will show an abnormality in the lung (pulmonary infiltrate) if you have pneumonia. Other tests of your blood, urine, or sputum may be done to find the specific cause of your pneumonia. Your caregiver may also do tests (blood gases or pulse oximetry) to see how well your lungs are working. TREATMENT  Some forms of pneumonia may be spread to other people when you cough or sneeze. You may be asked to wear a mask before and during your exam. Pneumonia that is caused by bacteria is treated with antibiotic medicine. Pneumonia that is caused by the influenza virus may be treated with an antiviral medicine. Most other viral infections must run their course. These infections will not respond to antibiotics.  PREVENTION A pneumococcal shot (vaccine) is available to prevent a common bacterial cause of pneumonia. This is usually suggested for:  People over 65 years old.   Patients on chemotherapy.   People with chronic lung problems, such as bronchitis or emphysema.   People with immune system problems.  If you are over 65 or have a high risk condition, you may receive the pneumococcal vaccine if you have not received it before. In some countries, a routine influenza vaccine is also recommended. This vaccine can help prevent some cases of pneumonia.You may be offered the influenza vaccine as part of your care. If you smoke, it is time to quit. You may receive instructions on how to stop smoking. Your caregiver can provide medicines and  counseling to help you quit. HOME CARE INSTRUCTIONS   Cough suppressants may be used if you are losing too much rest. However, coughing protects you by clearing your lungs. You should avoid using cough suppressants if you can.   Your caregiver may have prescribed medicine if he or she thinks your pneumonia is caused by a bacteria or influenza. Finish your medicine even if you start to feel better.   Your caregiver may also prescribe an expectorant. This loosens the mucus to be coughed up.   Only take over-the-counter or prescription medicines for pain, discomfort, or fever as directed by your caregiver.   Do not smoke. Smoking is a common cause of bronchitis and can contribute to pneumonia. If you are a smoker and continue to smoke, your cough may last several weeks after your pneumonia has cleared.   A cold steam vaporizer or humidifier in your room or home may help loosen mucus.   Coughing is often worse at night. Sleeping in a semi-upright position in a recliner or using a couple pillows under your head will help with this.   Get rest as you feel it is needed. Your body will usually let you know when you need to rest.  SEEK IMMEDIATE MEDICAL CARE IF:   Your illness becomes worse. This is especially true if you are elderly or weakened from any other disease.   You cannot control your cough with suppressants and are losing sleep.   You begin coughing up blood.   You develop pain which is getting worse or   is uncontrolled with medicines.   You have a fever.   Any of the symptoms which initially brought you in for treatment are getting worse rather than better.   You develop shortness of breath or chest pain.  MAKE SURE YOU:   Understand these instructions.   Will watch your condition.   Will get help right away if you are not doing well or get worse.  Document Released: 09/26/2005 Document Revised: 09/15/2011 Document Reviewed: 12/16/2010 [facility] Patient Information 2012  [place], [place].  RESOURCE GUIDE  Dental Problems  Patients with Medicaid: [facility] [phone]                                           [phone] [address] Phone:  [phone]                                                  Phone:  [phone]  If unable to pay or uninsured, contact:  [facility] or [facility]. to become qualified for the adult dental clinic.  Chronic Pain Problems Contact [facility]  [phone] Patients need to be referred by their primary care doctor.  Insufficient Money for Medicine Contact [employer]:  call "211" or [facility] [phone].  No Primary Care Doctor Call [facility]  732-485-2366 Other agencies that provide inexpensive medical care    [facility]  [phone]    [facility]  [phone]    [facility]  [phone]    [facility]  [phone]    [facility]  [phone]    [facility]  [phone]  Psychological Services [facility]  [phone] [facility] Services  [phone] [facility]   [phone] (emergency services 9861313379)  Substance Abuse Resources [facility]  ([phone] [facility] (912)627-8387 [facility] (240)302-3146 Floydene Flock 559-134-3942 Residential & Outpatient Substance Abuse Program  6150750171  Abuse/Neglect [facility] Child Abuse Hotline 747-395-2992 [facility] Child Abuse Hotline 786-404-1755 (After Hours)  Emergency Shelter [facility] 303-033-2614  Maternity Homes [facility] 201-046-1591 [facility] 431-430-8520  MRSA Hotline #:   704-727-9777    [facility] Resources  [facility] [place] [facility]     [employer]                          [facility] Dept. [address] [place]                        [address]      [address]  Patrecia Pace  [facility] Phone:  [phone]                                   Phone:  [phone]                 Phone:  [place] Phone:  [place] [phone] (After Hours)

## 2012-01-02 NOTE — ED Provider Notes (Signed)
History    35 old female with cough. Patient was recently diagnosed with pneumonia in emergency room 3 days ago. Was started on azithromycin. Patient has no followup since she is homeless and is requested to return to the ER for evaluation. Patient states persistent cough but feels like her breathing has improved. No fevers or chills. Reports compliance with her medication. No acute complaints since last evaluation.  CSN: [id number]  Arrival date & time 01/02/12  1211   First MD Initiated Contact with Patient 01/02/12 1404      Chief Complaint  Patient presents with  . Cough  . Pneumonia    (Consider location/radiation/quality/duration/timing/severity/associated sxs/prior treatment) HPI  Past Medical History  Diagnosis Date  . Mitral valve prolapse   . Heart palpitations   . Arthritis   . Back pain   . Pneumonia     History reviewed. No pertinent past surgical history.  History reviewed. No pertinent family history.  History  Substance Use Topics  . Smoking status: Current Everyday Smoker -- 0.5 packs/day  . Smokeless tobacco: Not on file  . Alcohol Use: No    OB History    Grav Para Term Preterm Abortions TAB SAB Ect Mult Living                  Review of Systems   Review of symptoms negative unless otherwise noted in HPI.   Allergies  Review of patient's allergies indicates no known allergies.  Home Medications   Current Outpatient Rx  Name Route Sig Dispense Refill  . ALBUTEROL SULFATE HFA 108 (90 BASE) MCG/ACT IN AERS Inhalation Inhale 2 puffs into the lungs every 6 (six) hours as needed. Shortness of breath    . AZITHROMYCIN 250 MG PO TABS Oral Take 500 mg by mouth daily.    . GUAIFENESIN ER 600 MG PO TB12 Oral Take 1,200 mg by mouth 2 (two) times daily as needed. For cough    . DAYQUIL PO Oral Take 2 capsules by mouth 2 (two) times daily as needed. For cough      BP 118/90  Pulse 65  Temp(Src) 98 F (36.7 C) (Oral)  Resp 20  SpO2 100%  LMP  12/16/2011  Physical Exam  Nursing note and vitals reviewed. Constitutional: She appears well-developed and well-nourished. No distress.  HENT:  Head: Normocephalic and atraumatic.  Eyes: Conjunctivae are normal. Right eye exhibits no discharge. Left eye exhibits no discharge.  Neck: Neck supple.  Cardiovascular: Normal rate, regular rhythm and normal heart sounds.  Exam reveals no gallop and no friction rub.   No murmur heard. Pulmonary/Chest: Effort normal. No respiratory distress.       Faint rhonchi b/l  Abdominal: Soft. She exhibits no distension. There is no tenderness.  Musculoskeletal: She exhibits no edema and no tenderness.  Neurological: She is alert.  Skin: Skin is warm and dry. She is not diaphoretic.  Psychiatric: She has a normal mood and affect. Her behavior is normal. Thought content normal.    ED Course  Procedures (including critical care time)  Labs Reviewed - No data to display Dg Chest 2 View  01/02/2012  *RADIOLOGY REPORT*  Clinical Data: Pneumonia.  Cough.  CHEST - 2 VIEW  Comparison: 12/30/2011  Findings: Two-view exam shows hyperexpansion.  The by basilar airspace opacities seen on the previous study has improved in the interval, but not resolved. The cardiopericardial silhouette is within normal limits for size. Imaged bony structures of the thorax are intact.  IMPRESSION:  Interval improvement in the bibasilar airspace disease without resolution.  Original Report Authenticated By: ERIC A. MANSELL, M.D.     1. CAP (community acquired pneumonia)       MDM  20 old female with persistent cough. Patient recently evaluated with pneumonia and started on azithromycin. Chest x-ray today shows bilateral airspace disease but interval improvement. Patient has rhonchi bilaterally but she has no respiratory distress. She is having 100% on room air. Patient is still taking her antibiotics. Plan for patient to finish her course of antibiotics. Given that patient has  radiographic improvement of her space disease and she is experiencing no respiratory distress on my examination feel that she is appropriate for discharge at this point in time. Return precautions were again discussed. Discussed followup options and resource list provided.        Raeford Razor, MD 01/04/12 [phone]

## 2012-01-02 NOTE — ED Notes (Signed)
Patient transported to X-ray 

## 2012-04-09 ENCOUNTER — Emergency Department (HOSPITAL_COMMUNITY)
Admission: EM | Admit: 2012-04-09 | Discharge: 2012-04-10 | Disposition: A | Discharge status: Home / Self Care | Payer: Medicaid Other | Attending: Emergency Medicine | Admitting: Emergency Medicine

## 2012-04-09 ENCOUNTER — Encounter (HOSPITAL_COMMUNITY): Payer: Self-pay | Admitting: *Deleted

## 2012-04-09 ENCOUNTER — Emergency Department (HOSPITAL_COMMUNITY): Payer: Medicaid Other

## 2012-04-09 DIAGNOSIS — K5289 Other specified noninfective gastroenteritis and colitis: Secondary | ICD-10-CM | POA: Insufficient documentation

## 2012-04-09 DIAGNOSIS — Z8739 Personal history of other diseases of the musculoskeletal system and connective tissue: Secondary | ICD-10-CM | POA: Insufficient documentation

## 2012-04-09 DIAGNOSIS — F17200 Nicotine dependence, unspecified, uncomplicated: Secondary | ICD-10-CM | POA: Insufficient documentation

## 2012-04-09 DIAGNOSIS — K529 Noninfective gastroenteritis and colitis, unspecified: Secondary | ICD-10-CM

## 2012-04-09 DIAGNOSIS — R188 Other ascites: Secondary | ICD-10-CM | POA: Insufficient documentation

## 2012-04-09 DIAGNOSIS — D72829 Elevated white blood cell count, unspecified: Secondary | ICD-10-CM | POA: Insufficient documentation

## 2012-04-09 LAB — COMPREHENSIVE METABOLIC PANEL
ALT: 17 U/L (ref 0–35)
Alkaline Phosphatase: 80 U/L (ref 39–117)
BUN: 17 mg/dL (ref 6–23)
CO2: 21 mEq/L (ref 19–32)
Calcium: 10.1 mg/dL (ref 8.4–10.5)
GFR calc Af Amer: >90 mL/min (ref >90)
GFR calc non Af Amer: >90 mL/min (ref >90)
Glucose, Bld: 122 mg/dL — ABNORMAL HIGH (ref 70–99)
Sodium: 136 mEq/L (ref 135–145)
Total Protein: 8.6 g/dL — ABNORMAL HIGH (ref 6.0–8.3)

## 2012-04-09 LAB — CBC WITH DIFFERENTIAL/PLATELET
Eosinophils Absolute: 0.1 10*3/uL (ref 0.0–0.7)
Eosinophils Relative: 1 % (ref 0–5)
HCT: 39.6 % (ref 36.0–46.0)
Hemoglobin: 12.4 g/dL (ref 12.0–15.0)
Lymphocytes Relative: 8 % — ABNORMAL LOW (ref 12–46)
Lymphs Abs: 1.3 10*3/uL (ref 0.7–4.0)
MCH: 22.7 pg — ABNORMAL LOW (ref 26.0–34.0)
MCV: 72.5 fL — ABNORMAL LOW (ref 78.0–100.0)
Monocytes Relative: 4 % (ref 3–12)
Platelets: 393 10*3/uL (ref 150–400)
RBC: 5.46 MIL/uL — ABNORMAL HIGH (ref 3.87–5.11)
WBC: 16.2 10*3/uL — ABNORMAL HIGH (ref 4.0–10.5)

## 2012-04-09 LAB — URINALYSIS, ROUTINE W REFLEX MICROSCOPIC
Bilirubin Urine: NEGATIVE
Leukocytes, UA: NEGATIVE
Nitrite: NEGATIVE
Specific Gravity, Urine: 1.029 (ref 1.005–1.030)
Urobilinogen, UA: 0.2 mg/dL (ref 0.0–1.0)

## 2012-04-09 MED ORDER — HYDROMORPHONE HCL PF 1 MG/ML IJ SOLN
1.0000 mg | INTRAMUSCULAR | Status: DC | PRN
  Administered 2012-04-09 (×2): 1 mg via INTRAVENOUS
  Filled 2012-04-09 (×2): qty 1

## 2012-04-09 MED ORDER — ONDANSETRON HCL 4 MG/2ML IJ SOLN
4.0000 mg | Freq: Once | INTRAMUSCULAR | Status: AC
  Administered 2012-04-09: 4 mg via INTRAVENOUS
  Filled 2012-04-09: qty 2

## 2012-04-09 MED ORDER — ONDANSETRON HCL 4 MG/2ML IJ SOLN
INTRAMUSCULAR | Status: AC
  Administered 2012-04-09: 22:00:00
  Filled 2012-04-09: qty 2

## 2012-04-09 MED ORDER — SODIUM CHLORIDE 0.9 % IV SOLN
1000.0000 mL | INTRAVENOUS | Status: DC
  Administered 2012-04-10: 1000 mL via INTRAVENOUS

## 2012-04-09 MED ORDER — SODIUM CHLORIDE 0.9 % IV SOLN
1000.0000 mL | Freq: Once | INTRAVENOUS | Status: AC
  Administered 2012-04-09: 1000 mL via INTRAVENOUS

## 2012-04-09 MED ORDER — SODIUM CHLORIDE 0.9 % IV BOLUS (SEPSIS)
1000.0000 mL | Freq: Once | INTRAVENOUS | Status: AC
  Administered 2012-04-09: 500 mL via INTRAVENOUS

## 2012-04-09 NOTE — ED Notes (Signed)
Pt is completing bag from ems and will hand another bag to complete

## 2012-04-09 NOTE — ED Notes (Signed)
Bed:WHALA<BR> Expected date:04/09/12<BR> Expected time: 9:54 PM<BR> Means of arrival:Ambulance<BR> Comments:<BR> Hold for EMS n/v

## 2012-04-09 NOTE — ED Notes (Signed)
Per ems: pt c/o n/v since 12pm. Started out as abdominal pain. Abdomin tender to palpation. Iv 4mg  zofran and 20 l.hand

## 2012-04-09 NOTE — ED Notes (Signed)
Pt states she does not have to void at this time.

## 2012-04-10 MED ORDER — DIPHENOXYLATE-ATROPINE 2.5-0.025 MG PO TABS: 1.0000 | ORAL_TABLET | Freq: Four times a day (QID) | ORAL | Status: DC | PRN

## 2012-04-10 MED ORDER — IOHEXOL 300 MG/ML  SOLN
100.0000 mL | Freq: Once | INTRAMUSCULAR | Status: AC | PRN
  Administered 2012-04-10: 100 mL via INTRAVENOUS

## 2012-04-10 MED ORDER — ONDANSETRON HCL 4 MG PO TABS: 4.0000 mg | ORAL_TABLET | Freq: Four times a day (QID) | ORAL | Status: DC

## 2012-04-10 NOTE — ED Provider Notes (Signed)
Patient signed out to me by Dr. Iantha Fallen. CT results reviewed and consistent with enteritis. Patient to be given supportive treatment and repeat abdominal exam is nonsurgical and she is stable for discharge  Toy Baker, MD 04/10/12 [phone]

## 2012-04-10 NOTE — Discharge Instructions (Signed)
Your CAT scan of your abdomen today showed that you had gastroenteritis. Take the medications as directed and return here for any problems Viral Gastroenteritis Viral gastroenteritis is also known as stomach flu. This condition affects the stomach and intestinal tract. It can cause sudden diarrhea and vomiting. The illness typically lasts 3 to 8 days. Most people develop an immune response that eventually gets rid of the virus. While this natural response develops, the virus can make you quite ill. CAUSES  Many different viruses can cause gastroenteritis, such as rotavirus or noroviruses. You can catch one of these viruses by consuming contaminated food or water. You may also catch a virus by sharing utensils or other personal items with an infected person or by touching a contaminated surface. SYMPTOMS  The most common symptoms are diarrhea and vomiting. These problems can cause a severe loss of body fluids (dehydration) and a body salt (electrolyte) imbalance. Other symptoms may include:  Fever.   Headache.   Fatigue.   Abdominal pain.  DIAGNOSIS  Your caregiver can usually diagnose viral gastroenteritis based on your symptoms and a physical exam. A stool sample may also be taken to test for the presence of viruses or other infections. TREATMENT  This illness typically goes away on its own. Treatments are aimed at rehydration. The most serious cases of viral gastroenteritis involve vomiting so severely that you are not able to keep fluids down. In these cases, fluids must be given through an intravenous line (IV). HOME CARE INSTRUCTIONS   Drink enough fluids to keep your urine clear or pale yellow. Drink small amounts of fluids frequently and increase the amounts as tolerated.   Ask your caregiver for specific rehydration instructions.   Avoid:   Foods high in sugar.   Alcohol.   Carbonated drinks.   Tobacco.   Juice.   Caffeine drinks.   Extremely hot or cold fluids.    Fatty, greasy foods.   Too much intake of anything at one time.   Dairy products until 24 to 48 hours after diarrhea stops.   You may consume probiotics. Probiotics are active cultures of beneficial bacteria. They may lessen the amount and number of diarrheal stools in adults. Probiotics can be found in yogurt with active cultures and in supplements.   Wash your hands well to avoid spreading the virus.   Only take over-the-counter or prescription medicines for pain, discomfort, or fever as directed by your caregiver. Do not give aspirin to children. Antidiarrheal medicines are not recommended.   Ask your caregiver if you should continue to take your regular prescribed and over-the-counter medicines.   Keep all follow-up appointments as directed by your caregiver.  SEEK IMMEDIATE MEDICAL CARE IF:   You are unable to keep fluids down.   You do not urinate at least once every 6 to 8 hours.   You develop shortness of breath.   You notice blood in your stool or vomit. This may look like coffee grounds.   You have abdominal pain that increases or is concentrated in one small area (localized).   You have persistent vomiting or diarrhea.   You have a fever.   The patient is a child younger than 3 months, and he or she has a fever.   The patient is a child older than 3 months, and he or she has a fever and persistent symptoms.   The patient is a child older than 3 months, and he or she has a fever and symptoms suddenly  get worse.   The patient is a baby, and he or she has no tears when crying.  MAKE SURE YOU:   Understand these instructions.   Will watch your condition.   Will get help right away if you are not doing well or get worse.  Document Released: 09/26/2005 Document Revised: 09/15/2011 Document Reviewed: 07/13/2011 [facility] Patient Information 2012 [place], [place].

## 2012-04-18 ENCOUNTER — Encounter (HOSPITAL_COMMUNITY): Payer: Self-pay | Admitting: *Deleted

## 2012-04-18 ENCOUNTER — Emergency Department (HOSPITAL_COMMUNITY)
Admission: EM | Admit: 2012-04-18 | Discharge: 2012-04-18 | Disposition: A | Discharge status: Home / Self Care | Payer: Medicaid Other | Attending: Emergency Medicine | Admitting: Emergency Medicine

## 2012-04-18 ENCOUNTER — Emergency Department (HOSPITAL_COMMUNITY): Payer: Medicaid Other

## 2012-04-18 DIAGNOSIS — F17200 Nicotine dependence, unspecified, uncomplicated: Secondary | ICD-10-CM | POA: Insufficient documentation

## 2012-04-18 DIAGNOSIS — Z8739 Personal history of other diseases of the musculoskeletal system and connective tissue: Secondary | ICD-10-CM | POA: Insufficient documentation

## 2012-04-18 DIAGNOSIS — I059 Rheumatic mitral valve disease, unspecified: Secondary | ICD-10-CM | POA: Insufficient documentation

## 2012-04-18 DIAGNOSIS — J029 Acute pharyngitis, unspecified: Secondary | ICD-10-CM | POA: Insufficient documentation

## 2012-04-18 MED ORDER — IBUPROFEN 600 MG PO TABS: 600.0000 mg | ORAL_TABLET | Freq: Four times a day (QID) | ORAL | Status: AC | PRN

## 2012-04-18 MED ORDER — DEXAMETHASONE SODIUM PHOSPHATE 10 MG/ML IJ SOLN
10.0000 mg | Freq: Once | INTRAMUSCULAR | Status: AC
  Administered 2012-04-18: 10 mg via INTRAMUSCULAR
  Filled 2012-04-18: qty 1

## 2012-04-18 MED ORDER — IBUPROFEN 400 MG PO TABS
600.0000 mg | ORAL_TABLET | Freq: Once | ORAL | Status: AC
  Administered 2012-04-18: 600 mg via ORAL
  Filled 2012-04-18: qty 1

## 2012-04-18 NOTE — ED Notes (Signed)
Reports having sore throat x 3-4 days, having fevers and pain when swallowing liquids. Airway intact at triage.

## 2012-04-18 NOTE — ED Provider Notes (Signed)
History    This chart was scribed for Forbes Cellar, MD, MD by Smitty Pluck. The patient was seen in room [facility] and the patient's care was started at 4:37PM.   CSN: [id number]  Arrival date & time 04/18/12  1531   First MD Initiated Contact with Patient 04/18/12 1617      Chief Complaint  Patient presents with  . Sore Throat    (Consider location/radiation/quality/duration/timing/severity/associated sxs/prior treatment) Patient is a 46 y.o. female presenting with pharyngitis. The history is provided by the patient.  Sore Throat   Christy Mayer is a 46 y.o. female who presents to the Emergency Department complaining of constant fever of 103 onset 5 days ago and moderate sore throat 2 days ago. Pt reports not being able to eat and drink anything. She reports that she can swallow but has pain. Pain is rated at 10/10. Pt has taken tylenol extra strength with minor relief. She reports that she has neck swelling. Pt denies rash. pt reports last fever was 101 this AM. Reports that her head hurts and she has lower neck/upper chest pain. Denies cough.  Denies radiation.    Arta Silence, RN 04/18/2012 15:54  Reports having sore throat x 3-4 days, having fevers and pain when swallowing liquids. Airway intact at triage.  Past Medical History  Diagnosis Date  . Mitral valve prolapse   . Heart palpitations   . Arthritis   . Back pain   . Pneumonia     History reviewed. No pertinent past surgical history.  History reviewed. No pertinent family history.  History  Substance Use Topics  . Smoking status: Current Everyday Smoker -- 0.5 packs/day  . Smokeless tobacco: Not on file  . Alcohol Use: No    OB History    Grav Para Term Preterm Abortions TAB SAB Ect Mult Living                  Review of Systems  All other systems reviewed and are negative.  10 Systems reviewed and all are negative for acute change except as noted in the HPI.   Allergies  Review of patient's  allergies indicates no known allergies.  Home Medications   Current Outpatient Rx  Name Route Sig Dispense Refill  . ACETAMINOPHEN 500 MG PO TABS Oral Take 500 mg by mouth every 6 (six) hours as needed. For pain    . IBUPROFEN 600 MG PO TABS Oral Take 1 tablet (600 mg total) by mouth every 6 (six) hours as needed for pain. 30 tablet 0  . ONDANSETRON HCL 4 MG PO TABS Oral Take 4 mg by mouth every 6 (six) hours. Nausea      BP 132/84  Pulse 74  Temp 98.3 F (36.8 C) (Oral)  Resp 18  SpO2 100%  LMP 04/17/2012  Physical Exam  Nursing note and vitals reviewed. Constitutional: She appears well-developed and well-nourished.  HENT:  Head: Normocephalic and atraumatic.       erythematous posterior oropharynx  1+ swelling of tonsils No exudate  No muffled voice  No trismus uvula midline  Anterior cervical adenopathy with left greater than right    Eyes: Conjunctivae are normal. Pupils are equal, round, and reactive to light.  Cardiovascular: Normal rate, regular rhythm and normal heart sounds.        No muffled heart sounds  Pulmonary/Chest: Effort normal and breath sounds normal. No respiratory distress. She exhibits tenderness.  Abdominal: Soft. She exhibits no distension. There is no  tenderness.  Skin: Skin is warm and dry.  Psychiatric: She has a normal mood and affect. Her behavior is normal.    Date: 04/18/2012  Rate: 72  Rhythm: normal sinus rhythm  QRS Axis: normal  Intervals: normal  ST/T Wave abnormalities: normal  Conduction Disutrbances:none  Narrative Interpretation:   Old EKG Reviewed: none available  ED Course  Procedures (including critical care time) DIAGNOSTIC STUDIES: Oxygen Saturation is 100% on room air, normal by my interpretation.    COORDINATION OF CARE: 4:44PM EDP orders medication: ibuprofen 600 mg, decadron injection 10 mg     Labs Reviewed  RAPID STREP SCREEN  STREP A DNA PROBE   Dg Chest 2 View  04/18/2012  *RADIOLOGY REPORT*   Clinical Data: Chest pain and fever.  Sore throat.  CHEST - 2 VIEW  Comparison: 01/02/2012  Findings: The heart size and mediastinal contours are within normal limits.  Both lungs are clear.  The visualized skeletal structures are unremarkable.  IMPRESSION: Negative exam.  Original Report Authenticated By: Rosealee Albee, M.D.   1. Pharyngitis    MDM  Pharyngitis. Rapid strep neg. Sent for culture. Decadron, ibuprofen given in the ED. No EMC precluding discharge at this time. Given Precautions for return. PMD f/u.  I personally performed the services described in this documentation, which was scribed in my presence. The recorded information has been reviewed and considered.    Forbes Cellar, MD 04/18/12 1731

## 2012-04-18 NOTE — ED Notes (Signed)
Returned from xray

## 2012-04-19 LAB — STREP A DNA PROBE: Group A Strep Probe: NEGATIVE

## 2012-04-26 NOTE — ED Provider Notes (Signed)
History    (chart written on 7/18 from memory) CSN: [id number] Arrival date & time 04/09/12  2155 First MD Initiated Contact with Patient 04/09/12 2231      Chief Complaint  Patient presents with  . Nausea  . Emesis     HPI Pt presented to the ED with complaints of N/v abd pain which started about 12 pm. Nothing seems to be helping.  The pain is 10/10 and severe.  Past Medical History  Diagnosis Date  . Mitral valve prolapse   . Heart palpitations   . Arthritis   . Back pain   . Pneumonia     History reviewed. No pertinent past surgical history.  No family history on file.  History  Substance Use Topics  . Smoking status: Current Everyday Smoker -- 0.5 packs/day  . Smokeless tobacco: Not on file  . Alcohol Use: No    OB History    Grav Para Term Preterm Abortions TAB SAB Ect Mult Living                  Review of Systems  Constitutional: Negative for fever.  Cardiovascular: Negative for chest pain.  Gastrointestinal: Positive for abdominal pain.  Genitourinary: Negative for dysuria.    Allergies  Review of patient's allergies indicates no known allergies.  Home Medications   Current Outpatient Rx  Name Route Sig Dispense Refill  . ACETAMINOPHEN 500 MG PO TABS Oral Take 500 mg by mouth every 6 (six) hours as needed. For pain    . IBUPROFEN 600 MG PO TABS Oral Take 1 tablet (600 mg total) by mouth every 6 (six) hours as needed for pain. 30 tablet 0    BP 115/67  Pulse 65  Temp 98.9 F (37.2 C) (Oral)  Resp 22  SpO2 97%  LMP 03/10/2012  Physical Exam  Nursing note and vitals reviewed. Constitutional: She appears well-developed and well-nourished. No distress.  HENT:  Head: Normocephalic and atraumatic.  Right Ear: External ear normal.  Left Ear: External ear normal.  Eyes: Conjunctivae are normal. Right eye exhibits no discharge. Left eye exhibits no discharge. No scleral icterus.  Neck: Neck supple. No tracheal deviation present.    Cardiovascular: Normal rate and regular rhythm.   Pulmonary/Chest: Effort normal and breath sounds normal. No stridor. No respiratory distress.  Abdominal: She exhibits no distension. There is tenderness. There is no rebound and no guarding.  Musculoskeletal: She exhibits no edema.  Neurological: She is alert. Cranial nerve deficit: no gross deficits.  Skin: Skin is warm and dry. No rash noted.  Psychiatric: She has a normal mood and affect.    ED Course  Procedures (including critical care time)  Labs Reviewed  CBC WITH DIFFERENTIAL - Abnormal; Notable for the following:    WBC 16.2 (*)     RBC 5.46 (*)     MCV 72.5 (*)     MCH 22.7 (*)     RDW 17.8 (*)     Neutrophils Relative 87 (*)     Neutro Abs 14.0 (*)     Lymphocytes Relative 8 (*)     All other components within normal limits  COMPREHENSIVE METABOLIC PANEL - Abnormal; Notable for the following:    Glucose, Bld 122 (*)     Total Protein 8.6 (*)     All other components within normal limits  URINALYSIS, ROUTINE W REFLEX MICROSCOPIC - Abnormal; Notable for the following:    Ketones, ur TRACE (*)  All other components within normal limits  LIPASE, BLOOD  POCT PREGNANCY, URINE  LAB REPORT - SCANNED   No results found.   MDM  CT scan ordered.  Case turned over to Dr Freida Busman pending CT scan and disposition.        Celene Kras, MD 04/26/12 [phone]

## 2013-02-10 ENCOUNTER — Emergency Department (HOSPITAL_COMMUNITY)
Admission: EM | Admit: 2013-02-10 | Discharge: 2013-02-11 | Disposition: A | Discharge status: Home / Self Care | Payer: Medicaid Other | Attending: Emergency Medicine | Admitting: Emergency Medicine

## 2013-02-10 ENCOUNTER — Encounter (HOSPITAL_COMMUNITY): Payer: Self-pay

## 2013-02-10 DIAGNOSIS — N912 Amenorrhea, unspecified: Secondary | ICD-10-CM | POA: Insufficient documentation

## 2013-02-10 DIAGNOSIS — Z9889 Other specified postprocedural states: Secondary | ICD-10-CM | POA: Insufficient documentation

## 2013-02-10 DIAGNOSIS — R102 Pelvic and perineal pain: Secondary | ICD-10-CM

## 2013-02-10 DIAGNOSIS — N949 Unspecified condition associated with female genital organs and menstrual cycle: Secondary | ICD-10-CM | POA: Insufficient documentation

## 2013-02-10 DIAGNOSIS — Z8739 Personal history of other diseases of the musculoskeletal system and connective tissue: Secondary | ICD-10-CM | POA: Insufficient documentation

## 2013-02-10 DIAGNOSIS — Z8679 Personal history of other diseases of the circulatory system: Secondary | ICD-10-CM | POA: Insufficient documentation

## 2013-02-10 DIAGNOSIS — Z3202 Encounter for pregnancy test, result negative: Secondary | ICD-10-CM | POA: Insufficient documentation

## 2013-02-10 DIAGNOSIS — N83209 Unspecified ovarian cyst, unspecified side: Secondary | ICD-10-CM

## 2013-02-10 DIAGNOSIS — Z8701 Personal history of pneumonia (recurrent): Secondary | ICD-10-CM | POA: Insufficient documentation

## 2013-02-10 DIAGNOSIS — F17200 Nicotine dependence, unspecified, uncomplicated: Secondary | ICD-10-CM | POA: Insufficient documentation

## 2013-02-10 HISTORY — DX: Complete or unspecified spontaneous abortion without complication: O03.9

## 2013-02-10 LAB — URINALYSIS, MICROSCOPIC ONLY
Leukocytes, UA: NEGATIVE
Nitrite: NEGATIVE
Specific Gravity, Urine: 1.01 (ref 1.005–1.030)
Urobilinogen, UA: 0.2 mg/dL (ref 0.0–1.0)
pH: 6 (ref 5.0–8.0)

## 2013-02-10 LAB — CBC WITH DIFFERENTIAL/PLATELET
Basophils Relative: 1 % (ref 0–1)
HCT: 32.1 % — ABNORMAL LOW (ref 36.0–46.0)
Hemoglobin: 10.4 g/dL — ABNORMAL LOW (ref 12.0–15.0)
MCHC: 32.4 g/dL (ref 30.0–36.0)
Monocytes Absolute: 0.5 10*3/uL (ref 0.1–1.0)
Monocytes Relative: 6 % (ref 3–12)
Neutro Abs: 5.1 10*3/uL (ref 1.7–7.7)

## 2013-02-10 LAB — COMPREHENSIVE METABOLIC PANEL
ALT: 17 U/L (ref 0–35)
BUN: 12 mg/dL (ref 6–23)
CO2: 26 mEq/L (ref 19–32)
Calcium: 9.3 mg/dL (ref 8.4–10.5)
Creatinine, Ser: 0.83 mg/dL (ref 0.50–1.10)
GFR calc Af Amer: >90 mL/min (ref >90)
GFR calc non Af Amer: 83 mL/min — ABNORMAL LOW (ref >90)
Glucose, Bld: 138 mg/dL — ABNORMAL HIGH (ref 70–99)
Sodium: 138 mEq/L (ref 135–145)

## 2013-02-10 MED ORDER — HYDROCODONE-ACETAMINOPHEN 5-325 MG PO TABS
2.0000 | ORAL_TABLET | Freq: Once | ORAL | Status: AC
  Administered 2013-02-11: 2 via ORAL
  Filled 2013-02-10 (×2): qty 1

## 2013-02-10 NOTE — ED Notes (Signed)
Patient presents with c/o lower abdominal pain since March 2014. Pain has progressively gotten worse and now unrelieved with tylenol or ibuprofen. Also has had nausea and urinary frequency. No vomiting. No diarrhea or constipation. No hematuria/dysuria. No blood in stool. No vaginal pain, discharge or odor. LMP February 2014. Has taken about 6-7 home pregnancy test since March, all has been negative.

## 2013-02-10 NOTE — ED Provider Notes (Signed)
History     CSN: [id number]  Arrival date & time 02/10/13  2230   First MD Initiated Contact with Patient 02/10/13 2321      Chief Complaint  Patient presents with  . Abdominal Pain    (Consider location/radiation/quality/duration/timing/severity/associated sxs/prior treatment) HPI Christy Mayer is a 47 y.o. female G5 P4 Presents withlower abdominal pain she says feels like cramping, and at times pain radiates to the back, she's had no menstrual period since March, has taken multiple pregnancy tests which all been negative, she's had some spotting but no periods. No fevers or chills. Patient comes in tonight because over-the-counter medications have not helped her pain. She's had no vomiting, diarrhea, no nausea.  Dysuria, no frequency. No vaginal discharge.  Smokes one pack per day Past Medical History  Diagnosis Date  . Mitral valve prolapse   . Heart palpitations   . Arthritis   . Back pain   . Pneumonia   . Miscarriage     Past Surgical History  Procedure Laterality Date  . Cesarean section  11/2007    No family history on file.  History  Substance Use Topics  . Smoking status: Current Every Day Smoker -- 0.50 packs/day  . Smokeless tobacco: Never Used  . Alcohol Use: No    OB History   Grav Para Term Preterm Abortions TAB SAB Ect Mult Living                  Review of Systems At least 10pt or greater review of systems completed and are negative except where specified in the HPI.  Allergies  Review of patient's allergies indicates no known allergies.  Home Medications   Current Outpatient Rx  Name  Route  Sig  Dispense  Refill  . acetaminophen (TYLENOL) 500 MG tablet   Oral   Take 1,000 mg by mouth every 6 (six) hours as needed. For pain         . ibuprofen (ADVIL,MOTRIN) 200 MG tablet   Oral   Take 400 mg by mouth every 6 (six) hours as needed for pain. For pain           BP 129/77  Pulse 87  Temp(Src) 98.8 F (37.1 C) (Oral)  Resp 20   SpO2 97%  LMP 11/25/2012  Physical Exam  Nursing notes reviewed.  Electronic medical record reviewed. VITAL SIGNS:   Filed Vitals:   02/10/13 2240 02/11/13 0359 02/11/13 0549  BP: 129/77 107/55 109/55  Pulse: 87  60  Temp: 98.8 F (37.1 C) 98.6 F (37 C)   TempSrc: Oral Oral   Resp: 20 18 18   SpO2: 97% 96% 99%   CONSTITUTIONAL: Awake, oriented, appears non-toxic HENT: Atraumatic, normocephalic, oral mucosa pink and moist, airway patent. Nares patent without drainage. External ears normal. EYES: Conjunctiva clear, EOMI, PERRLA NECK: Trachea midline, non-tender, supple CARDIOVASCULAR: Normal heart rate, Normal rhythm, No murmurs, rubs, gallops PULMONARY/CHEST: Clear to auscultation, no rhonchi, wheezes, or rales. Symmetrical breath sounds. Non-tender. ABDOMINAL: Non-distended, soft, tenderness to palpation in the lower abdomen without rebound or guarding.  BS normal. PELVIC EXAM: normal external genitalia, vulva, vagina, cervix, uterus and adnexa. NEUROLOGIC: Non-focal, moving all four extremities, no gross sensory or motor deficits. EXTREMITIES: No clubbing, cyanosis, or edema SKIN: Warm, Dry, No erythema, No rash  ED Course  Procedures (including critical care time)  Labs Reviewed  WET PREP, GENITAL - Abnormal; Notable for the following:    Clue Cells Wet Prep HPF POC FEW (*)  WBC, Wet Prep HPF POC MODERATE (*)    All other components within normal limits  URINALYSIS, MICROSCOPIC ONLY - Abnormal; Notable for the following:    APPearance CLOUDY (*)    Bacteria, UA MANY (*)    Squamous Epithelial / LPF MANY (*)    All other components within normal limits  COMPREHENSIVE METABOLIC PANEL - Abnormal; Notable for the following:    Glucose, Bld 138 (*)    GFR calc non Af Amer 83 (*)    All other components within normal limits  CBC WITH DIFFERENTIAL - Abnormal; Notable for the following:    Hemoglobin 10.4 (*)    HCT 32.1 (*)    MCV 73.1 (*)    MCH 23.7 (*)    RDW 17.1  (*)    All other components within normal limits  GC/CHLAMYDIA PROBE AMP  LIPASE, BLOOD  POCT PREGNANCY, URINE   US Transvaginal Non-ob  02/11/2013  *RADIOLOGY REPORT*  Clinical Data: 47 year old female with left side pelvic pain. Amenorrhea.  TRANSABDOMINAL AND TRANSVAGINAL ULTRASOUND OF PELVIS Technique:  Both transabdominal and transvaginal ultrasound examinations of the pelvis were performed. Transabdominal technique was performed for global imaging of the pelvis including uterus, ovaries, adnexal regions, and pelvic cul-de-sac.  It was necessary to proceed with endovaginal exam following the transabdominal exam to visualize the adnexa.  Comparison:  CT abdomen and pelvis 04/10/2012.  Pelvis ultrasound 12/01/2007 and earlier.  Findings:  Uterus: Within normal limits.  11.1 x 6.4 x 7.2 cm.  Endometrium: Measures up to 10 mm in thickness.  Right ovary:  Normal, 2.5 x 1.3 x 2.0 cm.  Small nearby hypoechoic lesion measuring 11 mm diameter without vascularity (image 46), favor benign paraovarian cyst.  Left ovary: Contains a large dominant cyst measuring up to 40 mm maximum diameter.  Within this dominant cyst are at least two smaller cystic areas (image 61).  No vascularity identified within the dominant cyst, the smaller internal cysts, and no surrounding hypervascularity.  Left ovary measures roughly 4.9 x 3.1 x 4.3 cm.  Other findings: No pelvic free fluid.  IMPRESSION: 1.  Left ovarian dominant cyst up to 40 mm diameter containing several discrete internal cysts.  No associated vascularity or surrounding hypervascularity.  Favor physiologic.  Still, recommend correlation with quantitative beta HCG to confirm no chance of ectopic pregnancy. 2. Otherwise negative pelvic ultrasound for a premenopausal patient.   Original Report Authenticated By: Erskine Speed, M.D.    US Pelvis Complete  02/11/2013  *RADIOLOGY REPORT*  Clinical Data: 47 year old female with left side pelvic pain. Amenorrhea.  TRANSABDOMINAL  AND TRANSVAGINAL ULTRASOUND OF PELVIS Technique:  Both transabdominal and transvaginal ultrasound examinations of the pelvis were performed. Transabdominal technique was performed for global imaging of the pelvis including uterus, ovaries, adnexal regions, and pelvic cul-de-sac.  It was necessary to proceed with endovaginal exam following the transabdominal exam to visualize the adnexa.  Comparison:  CT abdomen and pelvis 04/10/2012.  Pelvis ultrasound 12/01/2007 and earlier.  Findings:  Uterus: Within normal limits.  11.1 x 6.4 x 7.2 cm.  Endometrium: Measures up to 10 mm in thickness.  Right ovary:  Normal, 2.5 x 1.3 x 2.0 cm.  Small nearby hypoechoic lesion measuring 11 mm diameter without vascularity (image 46), favor benign paraovarian cyst.  Left ovary: Contains a large dominant cyst measuring up to 40 mm maximum diameter.  Within this dominant cyst are at least two smaller cystic areas (image 61).  No vascularity identified within the dominant cyst,  the smaller internal cysts, and no surrounding hypervascularity.  Left ovary measures roughly 4.9 x 3.1 x 4.3 cm.  Other findings: No pelvic free fluid.  IMPRESSION: 1.  Left ovarian dominant cyst up to 40 mm diameter containing several discrete internal cysts.  No associated vascularity or surrounding hypervascularity.  Favor physiologic.  Still, recommend correlation with quantitative beta HCG to confirm no chance of ectopic pregnancy. 2. Otherwise negative pelvic ultrasound for a premenopausal patient.   Original Report Authenticated By: Erskine Speed, M.D.      1. Ovarian cyst   2. Pelvic pain   3. Amenorrhea       MDM  Patient with amenorrhea and pelvic is not pregnant, but is having some significant pain, workup reveals a left-sided 4 cm ovarian cyst likely the cause pain, I do not think the patient has ovarian torsion at this time, have not identified a surgical pelvic or intra-abdominal emergency at this time. Patient's pain is well controlled,  [facility]. Patient has had no headaches, no neurologic symptoms, do not think any imaging of the head indicated at this time.       Jones Skene, MD 02/11/13 [phone]

## 2013-02-10 NOTE — ED Notes (Signed)
The pt is c.o lt lower abd pain for 2 months  lmp feb 16th.  Labs drawn at triage.  Family at the bedside.  No distress

## 2013-02-11 ENCOUNTER — Emergency Department (HOSPITAL_COMMUNITY): Payer: Medicaid Other

## 2013-02-11 LAB — WET PREP, GENITAL: Trich, Wet Prep: NONE SEEN

## 2013-02-11 LAB — GC/CHLAMYDIA PROBE AMP
CT Probe RNA: NEGATIVE
GC Probe RNA: NEGATIVE

## 2013-02-11 MED ORDER — KETOROLAC TROMETHAMINE 60 MG/2ML IM SOLN: 30.0000 mg | Freq: Once | INTRAMUSCULAR | Status: DC

## 2013-02-11 MED ORDER — OXYCODONE-ACETAMINOPHEN 5-325 MG PO TABS: 1.0000 | ORAL_TABLET | Freq: Four times a day (QID) | ORAL | Status: DC | PRN

## 2013-02-11 MED ORDER — KETOROLAC TROMETHAMINE 30 MG/ML IJ SOLN
30.0000 mg | Freq: Once | INTRAMUSCULAR | Status: DC
  Administered 2013-02-11: 30 mg via INTRAVENOUS
  Filled 2013-02-11: qty 1

## 2013-02-11 NOTE — ED Notes (Signed)
pts pain is better.  Waiting for  The pelvic exam

## 2013-02-11 NOTE — ED Notes (Signed)
Pain med given 

## 2013-02-11 NOTE — ED Notes (Signed)
Order changed to im from iv order not picked up on scanner.  toradol given im rt upper outer buttocls

## 2013-02-11 NOTE — ED Notes (Signed)
To ultrasound

## 2013-02-11 NOTE — ED Notes (Signed)
The pt continues to smile as she has throughout her visit.  She is requesting a sandwich

## 2013-02-13 ENCOUNTER — Ambulatory Visit (INDEPENDENT_AMBULATORY_CARE_PROVIDER_SITE_OTHER): Payer: Medicaid Other | Admitting: Obstetrics & Gynecology

## 2013-02-13 ENCOUNTER — Encounter: Payer: Self-pay | Admitting: Obstetrics & Gynecology

## 2013-02-13 VITALS — BP 127/78 | HR 68 | Ht 67.75 in | Wt 210.3 lb

## 2013-02-13 DIAGNOSIS — R102 Pelvic and perineal pain: Secondary | ICD-10-CM

## 2013-02-13 DIAGNOSIS — N83209 Unspecified ovarian cyst, unspecified side: Secondary | ICD-10-CM

## 2013-02-13 DIAGNOSIS — N949 Unspecified condition associated with female genital organs and menstrual cycle: Secondary | ICD-10-CM

## 2013-02-13 MED ORDER — DICLOFENAC POTASSIUM 50 MG PO TABS: 50.0000 mg | ORAL_TABLET | Freq: Three times a day (TID) | ORAL | Status: DC

## 2013-02-13 MED ORDER — OXYCODONE-ACETAMINOPHEN 5-325 MG PO TABS: 1.0000 | ORAL_TABLET | Freq: Four times a day (QID) | ORAL | Status: DC | PRN

## 2013-02-13 NOTE — Progress Notes (Signed)
Subjective:     Patient ID: Christy Mayer, female   DOB: 24-Sep-1966, 47 y.o.   MRN: [id number]  HPI  Pt seen in the ED 2 days ago with c/o pelvic pain.  Pt dx'd with ovarian cyst.  She has taken 17 Percocet since that time and says that it is the only thing that helps her with the pain.  She had her LMP in 11/2012.  She denies vasomotor sx or mood changes.  She denies discharge and her cx were negative in the ED. She denies F/C/N/V  Past Medical History  Diagnosis Date  . Mitral valve prolapse   . Heart palpitations   . Arthritis   . Back pain   . Pneumonia   . Miscarriage    Past Surgical History  Procedure Laterality Date  . Cesarean section  11/2007   Current Outpatient Prescriptions on File Prior to Visit  Medication Sig Dispense Refill  . acetaminophen (TYLENOL) 500 MG tablet Take 1,000 mg by mouth every 6 (six) hours as needed. For pain      . oxyCODONE-acetaminophen (PERCOCET/ROXICET) 5-325 MG per tablet Take 1-2 tablets by mouth every 6 (six) hours as needed for pain.  17 tablet  0   No current facility-administered medications on file prior to visit.  No Known Allergies History   Social History  . Marital Status: Married    Spouse Name: N/A    Number of Children: N/A  . Years of Education: N/A   Occupational History  . Not on file.   Social History Main Topics  . Smoking status: Current Every Day Smoker -- 0.50 packs/day  . Smokeless tobacco: Never Used  . Alcohol Use: No  . Drug Use: No  . Sexually Active: Yes    Birth Control/ Protection: None   Other Topics Concern  . Not on file   Social History Narrative  . No narrative on file        Review of Systems     Objective:   Physical Exam BP 127/78  Pulse 68  Ht 5' 7.75" (1.721 m)  Wt 210 lb 4.8 oz (95.391 kg)  BMI 32.21 kg/m2  LMP 11/25/2012 Pt in NAD Lungs: CTA CV: RRR Abd: soft, NT, ND, no rebound or guarding GU: EGBUS: no lesions Vagina: no blood in vault Cervix: no lesion; no  mucopurulent d/c Uterus: small, mobile Adnexa: no masses; sl tender; no mass effect noted         02/11/2013 Comparison: CT abdomen and pelvis 04/10/2012. Pelvis ultrasound  12/01/2007 and earlier.  Findings:  Uterus: Within normal limits. 11.1 x 6.4 x 7.2 cm.  Endometrium: Measures up to 10 mm in thickness.  Right ovary: Normal, 2.5 x 1.3 x 2.0 cm. Small nearby hypoechoic  lesion measuring 11 mm diameter without vascularity (image 46),  favor benign paraovarian cyst.  Left ovary: Contains a large dominant cyst measuring up to 40 mm  maximum diameter. Within this dominant cyst are at least two  smaller cystic areas (image 61). No vascularity identified within  the dominant cyst, the smaller internal cysts, and no surrounding  hypervascularity. Left ovary measures roughly 4.9 x 3.1 x 4.3 cm.  Other findings: No pelvic free fluid.  IMPRESSION:  1. Left ovarian dominant cyst up to 40 mm diameter containing  several discrete internal cysts. No associated vascularity or  surrounding hypervascularity. Favor physiologic. Still, recommend  correlation with quantitative beta HCG to confirm no chance of  ectopic pregnancy.  2. Otherwise negative  pelvic ultrasound for a premenopausal  patient.       Assessment:     Oligomenorrhea suspect perimenopause Pelvic pain- suspect due to ovarian cyst     Plan:     cataflam 50mg  po q 8 hrs prn pain Percocet 5 1 po q 6 hours prn pain #30 NO REFILLS F/u 3 months or sooner prn

## 2013-02-13 NOTE — Patient Instructions (Addendum)
Ovarian Cyst The ovaries are small organs that are on each side of the uterus. The ovaries are the organs that produce the female hormones, estrogen and progesterone. An ovarian cyst is a sac filled with fluid that can vary in its size. It is normal for a small cyst to form in women who are in the childbearing age and who have menstrual periods. This type of cyst is called a follicle cyst that becomes an ovulation cyst (corpus luteum cyst) after it produces the women's egg. It later goes away on its own if the woman does not become pregnant. There are other kinds of ovarian cysts that may cause problems and may need to be treated. The most serious problem is a cyst with cancer. It should be noted that menopausal women who have an ovarian cyst are at a higher risk of it being a cancer cyst. They should be evaluated very quickly, thoroughly and followed closely. This is especially true in menopausal women because of the high rate of ovarian cancer in women in menopause. CAUSES AND TYPES OF OVARIAN CYSTS:  FUNCTIONAL CYST: The follicle/corpus luteum cyst is a functional cyst that occurs every month during ovulation with the menstrual cycle. They go away with the next menstrual cycle if the woman does not get pregnant. Usually, there are no symptoms with a functional cyst.  ENDOMETRIOMA CYST: This cyst develops from the lining of the uterus tissue. This cyst gets in or on the ovary. It grows every month from the bleeding during the menstrual period. It is also called a "chocolate cyst" because it becomes filled with blood that turns brown. This cyst can cause pain in the lower abdomen during intercourse and with your menstrual period.  CYSTADENOMA CYST: This cyst develops from the cells on the outside of the ovary. They usually are not cancerous. They can get very big and cause lower abdomen pain and pain with intercourse. This type of cyst can twist on itself, cut off its blood supply and cause severe pain. It  also can easily rupture and cause a lot of pain.  DERMOID CYST: This type of cyst is sometimes found in both ovaries. They are found to have different kinds of body tissue in the cyst. The tissue includes skin, teeth, hair, and/or cartilage. They usually do not have symptoms unless they get very big. Dermoid cysts are rarely cancerous.  POLYCYSTIC OVARY: This is a rare condition with hormone problems that produces many small cysts on both ovaries. The cysts are follicle-like cysts that never produce an egg and become a corpus luteum. It can cause an increase in body weight, infertility, acne, increase in body and facial hair and lack of menstrual periods or rare menstrual periods. Many women with this problem develop type 2 diabetes. The exact cause of this problem is unknown. A polycystic ovary is rarely cancerous.  THECA LUTEIN CYST: Occurs when too much hormone (human chorionic gonadotropin) is produced and over-stimulates the ovaries to produce an egg. They are frequently seen when doctors stimulate the ovaries for invitro-fertilization (test tube babies).  LUTEOMA CYST: This cyst is seen during pregnancy. Rarely it can cause an obstruction to the birth canal during labor and delivery. They usually go away after delivery. SYMPTOMS   Pelvic pain or pressure.  Pain during sexual intercourse.  Increasing girth (swelling) of the abdomen.  Abnormal menstrual periods.  Increasing pain with menstrual periods.  You stop having menstrual periods and you are not pregnant. DIAGNOSIS  The diagnosis can   be made during:  Routine or annual pelvic examination (common).  Ultrasound.  X-ray of the pelvis.  CT Scan.  MRI.  Blood tests. TREATMENT   Treatment may only be to follow the cyst monthly for 2 to 3 months with your caregiver. Many go away on their own, especially functional cysts.  May be aspirated (drained) with a long needle with ultrasound, or by laparoscopy (inserting a tube into  the pelvis through a small incision).  The whole cyst can be removed by laparoscopy.  Sometimes the cyst may need to be removed through an incision in the lower abdomen.  Hormone treatment is sometimes used to help dissolve certain cysts.  Birth control pills are sometimes used to help dissolve certain cysts. HOME CARE INSTRUCTIONS  Follow your caregiver's advice regarding:  Medicine.  Follow up visits to evaluate and treat the cyst.  You may need to come back or make an appointment with another caregiver, to find the exact cause of your cyst, if your caregiver is not a gynecologist.  Get your yearly and recommended pelvic examinations and Pap tests.  Let your caregiver know if you have had an ovarian cyst in the past. SEEK MEDICAL CARE IF:   Your periods are late, irregular, they stop, or are painful.  Your stomach (abdomen) or pelvic pain does not go away.  Your stomach becomes larger or swollen.  You have pressure on your bladder or trouble emptying your bladder completely.  You have painful sexual intercourse.  You have feelings of fullness, pressure, or discomfort in your stomach.  You lose weight for no apparent reason.  You feel generally ill.  You become constipated.  You lose your appetite.  You develop acne.  You have an increase in body and facial hair.  You are gaining weight, without changing your exercise and eating habits.  You think you are pregnant. SEEK IMMEDIATE MEDICAL CARE IF:   You have increasing abdominal pain.  You feel sick to your stomach (nausea) and/or vomit.  You develop a fever that comes on suddenly.  You develop abdominal pain during a bowel movement.  Your menstrual periods become heavier than usual. Document Released: 09/26/2005 Document Revised: 12/19/2011 Document Reviewed: 07/30/2009 [facility] Patient Information 2013 [place], [place]. Perimenopause Perimenopause is the time when your body begins to move into the  menopause (no menstrual period for 12 straight months). It is a natural process. Perimenopause can begin 2 to 8 years before the menopause and usually lasts for one year after the menopause. During this time, your ovaries may or may not produce an egg. The ovaries vary in their production of estrogen and progesterone hormones each month. This can cause irregular menstrual periods, difficulty in getting pregnant, vaginal bleeding between periods and uncomfortable symptoms. CAUSES  Irregular production of the ovarian hormones, estrogen and progesterone, and not ovulating every month.  Other causes include:  Tumor of the pituitary gland in the brain.  Medical disease that affects the ovaries.  Radiation treatment.  Chemotherapy.  Unknown causes.  Heavy smoking and excessive alcohol intake can bring on perimenopause sooner. SYMPTOMS   Hot flashes.  Night sweats.  Irregular menstrual periods.  Decrease sex drive.  Vaginal dryness.  Headaches.  Mood swings.  Depression.  Memory problems.  Irritability.  Tiredness.  Weight gain.  Trouble getting pregnant.  The beginning of losing bone cells (osteoporosis).  The beginning of hardening of the arteries (atherosclerosis). DIAGNOSIS  Your caregiver will make a diagnosis by analyzing your age, menstrual history  and your symptoms. They will do a physical exam noting any changes in your body, especially your female organs. Female hormone tests may or may not be helpful depending on the amount and when you produce the female hormones. However, other hormone tests may be helpful (ex. thyroid hormone) to rule out other problems. TREATMENT  The decision to treat during the perimenopause should be made by you and your caregiver depending on how the symptoms are affecting you and your life style. There are various treatments available such as:  Treating individual symptoms with a specific medication for that symptom (ex. tranquilizer  for depression).  Herbal medications that can help specific symptoms.  Counseling.  Group therapy.  No treatment. HOME CARE INSTRUCTIONS   Before seeing your caregiver, make a list of your menstrual periods (when the occur, how heavy they are, how long between periods and how long they last), your symptoms and when they started.  Take the medication as recommended by your caregiver.  Sleep and rest.  Exercise.  Eat a diet that contains calcium (good for your bones) and soy (acts like estrogen hormone).  Do not smoke.  Avoid alcoholic beverages.  Taking vitamin E may help in certain cases.  Take calcium and vitamin D supplements to help prevent bone loss.  Group therapy is sometimes helpful.  Acupuncture may help in some cases. SEEK MEDICAL CARE IF:   You have any of the above and want to know if it is perimenopause.  You want advice and treatment for any of your symptoms mentioned above.  You need a referral to a specialist (gynecologist, psychiatrist or psychologist). SEEK IMMEDIATE MEDICAL CARE IF:   You have vaginal bleeding.  Your period lasts longer than 8 days.  You periods are recurring sooner than 21 days.  You have bleeding after intercourse.  You have severe depression.  You have pain when you urinate.  You have severe headaches.  You develop vision problems. Document Released: 11/03/2004 Document Revised: 12/19/2011 Document Reviewed: 07/24/2008 [facility] Patient Information 2013 [place], [place].

## 2013-08-08 ENCOUNTER — Emergency Department (HOSPITAL_COMMUNITY): Payer: Medicaid Other

## 2013-08-08 ENCOUNTER — Emergency Department (HOSPITAL_COMMUNITY)
Admission: EM | Admit: 2013-08-08 | Discharge: 2013-08-08 | Disposition: A | Discharge status: Home / Self Care | Payer: Medicaid Other | Attending: Emergency Medicine | Admitting: Emergency Medicine

## 2013-08-08 ENCOUNTER — Encounter (HOSPITAL_COMMUNITY): Payer: Self-pay | Admitting: Emergency Medicine

## 2013-08-08 DIAGNOSIS — Z79899 Other long term (current) drug therapy: Secondary | ICD-10-CM | POA: Insufficient documentation

## 2013-08-08 DIAGNOSIS — Z8701 Personal history of pneumonia (recurrent): Secondary | ICD-10-CM | POA: Insufficient documentation

## 2013-08-08 DIAGNOSIS — J3489 Other specified disorders of nose and nasal sinuses: Secondary | ICD-10-CM | POA: Insufficient documentation

## 2013-08-08 DIAGNOSIS — J029 Acute pharyngitis, unspecified: Secondary | ICD-10-CM | POA: Insufficient documentation

## 2013-08-08 DIAGNOSIS — M129 Arthropathy, unspecified: Secondary | ICD-10-CM | POA: Insufficient documentation

## 2013-08-08 DIAGNOSIS — R509 Fever, unspecified: Secondary | ICD-10-CM | POA: Insufficient documentation

## 2013-08-08 DIAGNOSIS — J189 Pneumonia, unspecified organism: Secondary | ICD-10-CM | POA: Insufficient documentation

## 2013-08-08 DIAGNOSIS — R0602 Shortness of breath: Secondary | ICD-10-CM | POA: Insufficient documentation

## 2013-08-08 DIAGNOSIS — I059 Rheumatic mitral valve disease, unspecified: Secondary | ICD-10-CM | POA: Insufficient documentation

## 2013-08-08 DIAGNOSIS — R599 Enlarged lymph nodes, unspecified: Secondary | ICD-10-CM | POA: Insufficient documentation

## 2013-08-08 DIAGNOSIS — F17200 Nicotine dependence, unspecified, uncomplicated: Secondary | ICD-10-CM

## 2013-08-08 DIAGNOSIS — B749 Filariasis, unspecified: Secondary | ICD-10-CM | POA: Insufficient documentation

## 2013-08-08 DIAGNOSIS — Z8742 Personal history of other diseases of the female genital tract: Secondary | ICD-10-CM | POA: Insufficient documentation

## 2013-08-08 LAB — CBC WITH DIFFERENTIAL/PLATELET
Basophils Absolute: 0 10*3/uL (ref 0.0–0.1)
Basophils Relative: 0 % (ref 0–1)
Hemoglobin: 12.3 g/dL (ref 12.0–15.0)
MCHC: 33.3 g/dL (ref 30.0–36.0)
Neutro Abs: 6.3 10*3/uL (ref 1.7–7.7)
Neutrophils Relative %: 69 % (ref 43–77)
Platelets: 275 10*3/uL (ref 150–400)
RDW: 15.4 % (ref 11.5–15.5)

## 2013-08-08 LAB — POCT I-STAT CREATININE: Creatinine, Ser: 0.9 mg/dL (ref 0.50–1.10)

## 2013-08-08 LAB — BASIC METABOLIC PANEL
Chloride: 102 mEq/L (ref 96–112)
GFR calc Af Amer: >90 mL/min (ref >90)
Potassium: 3.8 mEq/L (ref 3.5–5.1)
Sodium: 136 mEq/L (ref 135–145)

## 2013-08-08 MED ORDER — HYDROCODONE-ACETAMINOPHEN 7.5-325 MG/15ML PO SOLN: 15.0000 mL | Freq: Four times a day (QID) | ORAL | Status: DC | PRN

## 2013-08-08 MED ORDER — AZITHROMYCIN 250 MG PO TABS: ORAL_TABLET | ORAL | Status: DC

## 2013-08-08 MED ORDER — IOHEXOL 300 MG/ML  SOLN: 80.0000 mL | Freq: Once | INTRAMUSCULAR | Status: DC | PRN

## 2013-08-08 MED ORDER — LORAZEPAM 2 MG/ML IJ SOLN
1.0000 mg | Freq: Once | INTRAMUSCULAR | Status: AC
  Administered 2013-08-08: 1 mg via INTRAVENOUS
  Filled 2013-08-08: qty 1

## 2013-08-08 MED ORDER — PROCHLORPERAZINE EDISYLATE 5 MG/ML IJ SOLN
10.0000 mg | Freq: Once | INTRAMUSCULAR | Status: AC
  Administered 2013-08-08: 10 mg via INTRAVENOUS
  Filled 2013-08-08: qty 2

## 2013-08-08 MED ORDER — SODIUM CHLORIDE 0.9 % IV BOLUS (SEPSIS)
1000.0000 mL | Freq: Once | INTRAVENOUS | Status: AC
  Administered 2013-08-08: 1000 mL via INTRAVENOUS

## 2013-08-08 NOTE — ED Notes (Signed)
Pt presents with 2 week h/o cough.  Pt reports sore throat, nasal congestion - is unable to produce phlegm.  +shortness of breath has tried multiple OTC medication without relief.  Pt reports intermittent fever.

## 2013-08-08 NOTE — ED Provider Notes (Signed)
CSN: [id number]     Arrival date & time 08/08/13  1158 History   First MD Initiated Contact with Patient 08/08/13 1213     No chief complaint on file.  (Consider location/radiation/quality/duration/timing/severity/associated sxs/prior Treatment) HPI  Christy Mayer is a 47 y.o. female complaining of intermittently productive cough, pharyngitis, nasal congestion, subjective fever and shortness of breath worsening over the course of 2 weeks. Patient states that she feels like when she had pneumonia in the past. She is an active daily smoker. Patient denies any nausea vomiting, change in bowel or bladder habits or abdominal pain.  Past Medical History  Diagnosis Date  . Mitral valve prolapse   . Heart palpitations   . Arthritis   . Back pain   . Pneumonia   . Miscarriage    Past Surgical History  Procedure Laterality Date  . Cesarean section  11/2007   Family History  Problem Relation Age of Onset  . Adopted: Yes   History  Substance Use Topics  . Smoking status: Current Every Day Smoker -- 0.50 packs/day  . Smokeless tobacco: Never Used  . Alcohol Use: No   OB History   Grav Para Term Preterm Abortions TAB SAB Ect Mult Living   6 4 4  2 1 1   4      Review of Systems 10 systems reviewed and found to be negative, except as noted in the HPI   Allergies  Review of patient's allergies indicates no known allergies.  Home Medications   Current Outpatient Rx  Name  Route  Sig  Dispense  Refill  . acetaminophen (TYLENOL) 500 MG tablet   Oral   Take 1,000 mg by mouth every 6 (six) hours as needed. For pain         . Dextromethorphan Polistirex (DELSYM PO)   Oral   Take 15 mLs by mouth every 4 (four) hours as needed (for cough).         . Phenyleph-Doxylamine-DM-APAP (VICKS NYQUIL SEVERE COLD & FLU PO)   Oral   Take 30 mLs by mouth every 4 (four) hours as needed.         Marland Kitchen azithromycin (ZITHROMAX Z-PAK) 250 MG tablet      2 po day one, then 1 daily x 4 days  5 tablet   0   . HYDROcodone-acetaminophen (HYCET) 7.5-325 mg/15 ml solution   Oral   Take 15 mLs by mouth every 6 (six) hours as needed for cough.   45 mL   0    BP 139/89  Pulse 69  Temp(Src) 98.2 F (36.8 C) (Oral)  Resp 14  Ht 5\' 7"  (1.702 m)  SpO2 98%  LMP 06/27/2013 Physical Exam  Nursing note and vitals reviewed. Constitutional: She is oriented to person, place, and time. She appears well-developed and well-nourished. No distress.  HENT:  Head: Normocephalic.  Mouth/Throat: Oropharynx is clear and moist.  Posterior pharynx is injected, there is no tonsillar hypertrophy or exudate.  Patient is tender to palpation in the maxillary sinuses, left greater than right.  Eyes: Conjunctivae and EOM are normal. Pupils are equal, round, and reactive to light.  Neck: Normal range of motion. Neck supple.  Shotty, nontender, mobile anterior cervical lymphadenopathy  Cardiovascular: Normal rate, regular rhythm and intact distal pulses.   Pulmonary/Chest: Effort normal and breath sounds normal. No stridor. No respiratory distress. She has no wheezes. She has no rales. She exhibits no tenderness.  Abdominal: Soft. Bowel sounds are normal. She exhibits  no distension and no mass. There is no tenderness. There is no rebound and no guarding.  Musculoskeletal: Normal range of motion.  Lymphadenopathy:    She has cervical adenopathy.  Neurological: She is alert and oriented to person, place, and time.  Psychiatric: She has a normal mood and affect.    ED Course  Procedures (including critical care time) Labs Review Labs Reviewed  CBC WITH DIFFERENTIAL  BASIC METABOLIC PANEL  POCT I-STAT CREATININE   Imaging Review Dg Chest 2 View  08/08/2013   CLINICAL DATA:  Cough, fever  EXAM: CHEST  2 VIEW  COMPARISON:  04/18/2012  FINDINGS: Cardiomediastinal silhouette is stable. No acute infiltrate or pleural effusion. No pulmonary edema. Bony thorax is stable. There is nodular opacity in  right upper lobe measures about 2 cm. Further correlation with CT scan of the chest is recommended.  IMPRESSION: No acute infiltrate or pulmonary edema. Nodular opacity in right upper lobe measures about 2 cm. Further correlation with CT scan of the chest is recommended to exclude a lung nodule.   Electronically Signed   By: Natasha Mead M.D.   On: 08/08/2013 13:02   Ct Chest W Contrast  08/08/2013   CLINICAL DATA:  Cold, cough and low-grade fever.  EXAM: CT CHEST WITH CONTRAST  TECHNIQUE: Multidetector CT imaging of the chest was performed during intravenous contrast administration.  CONTRAST:  100 cc Omnipaque 300  COMPARISON:  04/16/2011.  FINDINGS: The chest wall is unremarkable. No breast masses, supraclavicular or axillary adenopathy. Small scattered lymph nodes are noted. The thyroid gland appears normal. The bony thorax is intact. No destructive bone lesions or spinal canal compromise.  The heart is normal in size. No pericardial effusion. No mediastinal or hilar mass or adenopathy. Small scattered lymph nodes are noted. The esophagus is grossly normal. The aorta is normal in caliber. No dissection. The branch vessels are patent.  Examination of the lung parenchyma demonstrates patchy posterior segment right upper lobe infiltrate/pneumonia. No pulmonary mass. The left lung is clear. No pleural effusion.  The upper abdomen is unremarkable.  IMPRESSION: Patchy right upper lobe infiltrate/pneumonia.   Electronically Signed   By: Loralie Champagne M.D.   On: 08/08/2013 14:47    EKG Interpretation   None       MDM   1. Community acquired pneumonia   2. Tobacco use disorder      Filed Vitals:   08/08/13 1203 08/08/13 1220 08/08/13 1515 08/08/13 1533  BP: 141/94  120/75 139/89  Pulse: 87  69 69  Temp: 98.2 F (36.8 C)     TempSrc: Oral     Resp: 20  18 14   Height: 5\' 7"  (1.702 m)     SpO2: 97% 99%  98%     Christy Mayer is a 47 y.o. female with cough, sore throat, nasal congestion  shortness of breath worsening over the course of 2 weeks. Chest x-ray shows nodule in the right upper lung. CT further defines this as an infiltrate. Patient will be started on a Z-Pak and given cough medication. We have discussed return precautions.   Medications  iohexol (OMNIPAQUE) 300 MG/ML solution 80 mL (not administered)  sodium chloride 0.9 % bolus 1,000 mL (0 mLs Intravenous Stopped 08/08/13 1534)  prochlorperazine (COMPAZINE) injection 10 mg (10 mg Intravenous Given 08/08/13 1411)  LORazepam (ATIVAN) injection 1 mg (1 mg Intravenous Given 08/08/13 1503)    Pt is hemodynamically stable, appropriate for, and amenable to discharge at this time. Pt  verbalized understanding and agrees with care plan. All questions answered. Outpatient follow-up and specific return precautions discussed.    Discharge Medication List as of 08/08/2013  3:27 PM    START taking these medications   Details  azithromycin (ZITHROMAX Z-PAK) 250 MG tablet 2 po day one, then 1 daily x 4 days, Print    HYDROcodone-acetaminophen (HYCET) 7.5-325 mg/15 ml solution Take 15 mLs by mouth every 6 (six) hours as needed for cough., Starting 08/08/2013, Until Discontinued, Print        Note: Portions of this report may have been transcribed using voice recognition software. Every effort was made to ensure accuracy; however, inadvertent computerized transcription errors may be present      Wynetta Emery, PA-C 08/08/13 1608

## 2013-08-08 NOTE — ED Notes (Signed)
Pt family member provided another [place] sandwich.

## 2013-08-08 NOTE — ED Provider Notes (Signed)
Medical screening examination/treatment/procedure(s) were performed by non-physician practitioner and as supervising physician I was immediately available for consultation/collaboration.  EKG Interpretation   None        Doug Sou, MD 08/08/13 [phone]

## 2013-08-17 ENCOUNTER — Emergency Department (HOSPITAL_COMMUNITY)
Admission: EM | Admit: 2013-08-17 | Discharge: 2013-08-17 | Disposition: A | Discharge status: Home / Self Care | Payer: Medicaid Other | Attending: Emergency Medicine | Admitting: Emergency Medicine

## 2013-08-17 ENCOUNTER — Encounter (HOSPITAL_COMMUNITY): Payer: Self-pay | Admitting: Emergency Medicine

## 2013-08-17 DIAGNOSIS — R112 Nausea with vomiting, unspecified: Secondary | ICD-10-CM | POA: Insufficient documentation

## 2013-08-17 DIAGNOSIS — R1032 Left lower quadrant pain: Secondary | ICD-10-CM | POA: Insufficient documentation

## 2013-08-17 DIAGNOSIS — M129 Arthropathy, unspecified: Secondary | ICD-10-CM | POA: Insufficient documentation

## 2013-08-17 DIAGNOSIS — K921 Melena: Secondary | ICD-10-CM | POA: Insufficient documentation

## 2013-08-17 DIAGNOSIS — R1110 Vomiting, unspecified: Secondary | ICD-10-CM

## 2013-08-17 DIAGNOSIS — R197 Diarrhea, unspecified: Secondary | ICD-10-CM | POA: Insufficient documentation

## 2013-08-17 DIAGNOSIS — Z8679 Personal history of other diseases of the circulatory system: Secondary | ICD-10-CM | POA: Insufficient documentation

## 2013-08-17 DIAGNOSIS — F17200 Nicotine dependence, unspecified, uncomplicated: Secondary | ICD-10-CM | POA: Insufficient documentation

## 2013-08-17 DIAGNOSIS — R1031 Right lower quadrant pain: Secondary | ICD-10-CM | POA: Insufficient documentation

## 2013-08-17 DIAGNOSIS — Z3202 Encounter for pregnancy test, result negative: Secondary | ICD-10-CM | POA: Insufficient documentation

## 2013-08-17 DIAGNOSIS — Z8701 Personal history of pneumonia (recurrent): Secondary | ICD-10-CM | POA: Insufficient documentation

## 2013-08-17 LAB — OCCULT BLOOD, POC DEVICE: Fecal Occult Bld: NEGATIVE

## 2013-08-17 LAB — URINE MICROSCOPIC-ADD ON

## 2013-08-17 LAB — URINALYSIS, ROUTINE W REFLEX MICROSCOPIC
Glucose, UA: NEGATIVE mg/dL
Urobilinogen, UA: 0.2 mg/dL (ref 0.0–1.0)
pH: 5.5 (ref 5.0–8.0)

## 2013-08-17 LAB — CBC WITH DIFFERENTIAL/PLATELET
Basophils Absolute: 0.1 10*3/uL (ref 0.0–0.1)
Basophils Relative: 1 % (ref 0–1)
Eosinophils Absolute: 0.1 10*3/uL (ref 0.0–0.7)
Eosinophils Relative: 1 % (ref 0–5)
HCT: 40.4 % (ref 36.0–46.0)
Hemoglobin: 13.9 g/dL (ref 12.0–15.0)
Lymphs Abs: 2.4 10*3/uL (ref 0.7–4.0)
MCHC: 34.4 g/dL (ref 30.0–36.0)
MCV: 84.3 fL (ref 78.0–100.0)
Monocytes Absolute: 0.6 10*3/uL (ref 0.1–1.0)
RDW: 15.1 % (ref 11.5–15.5)

## 2013-08-17 LAB — WET PREP, GENITAL
Clue Cells Wet Prep HPF POC: NONE SEEN
Trich, Wet Prep: NONE SEEN

## 2013-08-17 LAB — COMPREHENSIVE METABOLIC PANEL
AST: 15 U/L (ref 0–37)
Albumin: 4.2 g/dL (ref 3.5–5.2)
Calcium: 9.6 mg/dL (ref 8.4–10.5)
Creatinine, Ser: 0.72 mg/dL (ref 0.50–1.10)
Potassium: 3.6 mEq/L (ref 3.5–5.1)

## 2013-08-17 MED ORDER — HYDROCODONE-ACETAMINOPHEN 5-325 MG PO TABS: 1.0000 | ORAL_TABLET | ORAL | Status: DC | PRN

## 2013-08-17 MED ORDER — MORPHINE SULFATE 4 MG/ML IJ SOLN
4.0000 mg | Freq: Once | INTRAMUSCULAR | Status: AC
  Administered 2013-08-17: 4 mg via INTRAVENOUS
  Filled 2013-08-17: qty 1

## 2013-08-17 MED ORDER — SODIUM CHLORIDE 0.9 % IV BOLUS (SEPSIS)
1000.0000 mL | Freq: Once | INTRAVENOUS | Status: AC
  Administered 2013-08-17: 1000 mL via INTRAVENOUS

## 2013-08-17 MED ORDER — LOPERAMIDE HCL 2 MG PO CAPS
4.0000 mg | ORAL_CAPSULE | Freq: Once | ORAL | Status: AC
  Administered 2013-08-17: 4 mg via ORAL
  Filled 2013-08-17: qty 2

## 2013-08-17 MED ORDER — ONDANSETRON HCL 4 MG PO TABS: 4.0000 mg | ORAL_TABLET | Freq: Four times a day (QID) | ORAL | Status: DC

## 2013-08-17 MED ORDER — ONDANSETRON HCL 4 MG/2ML IJ SOLN
4.0000 mg | Freq: Once | INTRAMUSCULAR | Status: AC
  Administered 2013-08-17: 4 mg via INTRAVENOUS
  Filled 2013-08-17: qty 2

## 2013-08-17 MED ORDER — LOPERAMIDE HCL 2 MG PO CAPS: 2.0000 mg | ORAL_CAPSULE | Freq: Four times a day (QID) | ORAL | Status: DC | PRN

## 2013-08-17 NOTE — ED Notes (Signed)
Pharmacy tech at bedside 

## 2013-08-17 NOTE — ED Notes (Signed)
Pt ask for ginger ale for nausea. Pt given a 7.5oz can of ginger ale. Pt is not NPO restricted.

## 2013-08-17 NOTE — ED Notes (Addendum)
Pt c/o n/v/d and lower abd pain since 2am, reports she hasn't been able to keep anything down all day. sts a few hrs ago she noticed bright red bld in stool about a tablespoon each time. Denies hemorrhoids. Also sts that she was recently dx with pneumonia and has finished taking her antibiotics but doesn't feel that she has gotten much better. Denies vaginal discharge/bleeding. Pt in nad, skin warm and dry, resp e/u.

## 2013-08-17 NOTE — ED Notes (Signed)
Pt reports n/v since 2 am; pt also having rectal bleeding with bright red blood since this AM; pt also reports having pna for 3 weeks, was started on zpack and has not gotten any better; pt also reports lower abd pain

## 2013-08-17 NOTE — ED Provider Notes (Signed)
CSN: [id number]     Arrival date & time 08/17/13  1535 History   First MD Initiated Contact with Patient 08/17/13 1602     Chief Complaint  Patient presents with  . Abdominal Pain  . Emesis  . Rectal Bleeding   (Consider location/radiation/quality/duration/timing/severity/associated sxs/prior Treatment) Patient is a 47 y.o. female presenting with abdominal pain, vomiting, and hematochezia. The history is provided by the patient and the spouse.  Abdominal Pain Pain location:  LLQ and RLQ Pain quality: aching and cramping   Pain quality: not bloating, not burning, not dull, no fullness, not gnawing, not heavy, no pressure, not sharp and not shooting   Pain radiates to:  Does not radiate Pain severity:  Moderate Onset quality:  Gradual Duration:  14 hours Timing:  Constant Progression:  Waxing and waning Chronicity:  New Context: not alcohol use, not awakening from sleep, not diet changes, not eating, not laxative use and not medication withdrawal   Context comment:  Last meal: 1800 last night rice and gravy Relieved by:  None tried Worsened by:  Nothing tried Associated symptoms: diarrhea, hematochezia, nausea and vomiting   Diarrhea:    Quality:  Bloody and mucous   Number of occurrences:  Multiple   Severity:  Moderate   Duration:  12 hours   Timing:  Intermittent Vomiting:    Quality:  Bilious material   Number of occurrences:  6   Severity:  Mild   Duration:  12 hours   Timing:  Intermittent Risk factors: no alcohol abuse, no aspirin use, not elderly, has not had multiple surgeries, no NSAID use, not obese, not pregnant and no recent hospitalization   Emesis Associated symptoms: abdominal pain and diarrhea   Rectal Bleeding Associated symptoms: abdominal pain and vomiting     Past Medical History  Diagnosis Date  . Mitral valve prolapse   . Heart palpitations   . Arthritis   . Back pain   . Pneumonia   . Miscarriage    Past Surgical History  Procedure  Laterality Date  . Cesarean section  11/2007   Family History  Problem Relation Age of Onset  . Adopted: Yes   History  Substance Use Topics  . Smoking status: Current Every Day Smoker -- 0.50 packs/day    Types: Cigarettes  . Smokeless tobacco: Never Used  . Alcohol Use: No   OB History   Grav Para Term Preterm Abortions TAB SAB Ect Mult Living   6 4 4  2 1 1   4      Review of Systems  Gastrointestinal: Positive for nausea, vomiting, abdominal pain, diarrhea and hematochezia.    Allergies  Review of patient's allergies indicates no known allergies.  Home Medications   Current Outpatient Rx  Name  Route  Sig  Dispense  Refill  . acetaminophen (TYLENOL) 500 MG tablet   Oral   Take 1,000 mg by mouth every 6 (six) hours as needed. For pain         . azithromycin (ZITHROMAX Z-PAK) 250 MG tablet      2 po day one, then 1 daily x 4 days   5 tablet   0   . Dextromethorphan Polistirex (DELSYM PO)   Oral   Take 15 mLs by mouth every 4 (four) hours as needed (for cough).         Marland Kitchen HYDROcodone-acetaminophen (HYCET) 7.5-325 mg/15 ml solution   Oral   Take 15 mLs by mouth every 6 (six) hours as needed  for cough.   45 mL   0   . Phenyleph-Doxylamine-DM-APAP (VICKS NYQUIL SEVERE COLD & FLU PO)   Oral   Take 30 mLs by mouth every 4 (four) hours as needed.          BP 140/90  Pulse 115  Temp(Src) 98.1 F (36.7 C) (Oral)  Resp 18  Ht 5\' 7"  (1.702 m)  Wt 211 lb 1.6 oz (95.754 kg)  BMI 33.06 kg/m2  SpO2 98%  LMP 06/27/2013 Physical Exam  Constitutional: She is oriented to person, place, and time. She appears well-developed and well-nourished.  HENT:  Head: Normocephalic and atraumatic.  Mouth/Throat: Oropharynx is clear and moist. No oropharyngeal exudate.  Eyes: Conjunctivae are normal. Right eye exhibits no discharge. Left eye exhibits no discharge.  Neck: Normal range of motion. Neck supple. No JVD present.  Cardiovascular: Normal rate and regular rhythm.   Exam reveals no gallop and no friction rub.   No murmur heard. Pulmonary/Chest: Effort normal and breath sounds normal. No stridor. No respiratory distress. She has no wheezes. She has no rales. She exhibits no tenderness.  Abdominal: Soft. Normal appearance and bowel sounds are normal. She exhibits no shifting dullness, no distension, no pulsatile liver, no fluid wave, no abdominal bruit, no ascites, no pulsatile midline mass and no mass. There is no hepatosplenomegaly, splenomegaly or hepatomegaly. There is generalized tenderness. There is no rigidity, no rebound, no guarding, no CVA tenderness, no tenderness at McBurney's point and negative Murphy's sign. No hernia.  Genitourinary: Rectum normal, vagina normal and uterus normal. Rectal exam shows no fissure, no mass, no tenderness and anal tone normal. Guaiac negative stool. Pelvic exam was performed with patient supine. No labial fusion. There is no rash, tenderness or lesion on the right labia. There is no rash, tenderness or lesion on the left labia. Uterus is not deviated, not enlarged, not fixed and not tender. Cervix exhibits no motion tenderness, no discharge and no friability. Right adnexum displays no mass and no tenderness. Left adnexum displays no mass and no tenderness. No erythema, tenderness or bleeding around the vagina. No foreign body around the vagina. No vaginal discharge found.  Musculoskeletal: She exhibits no edema and no tenderness.  Lymphadenopathy:    She has no cervical adenopathy.  Neurological: She is alert and oriented to person, place, and time. She has normal reflexes.    ED Course  Procedures (including critical care time) Labs Review Results for orders placed during the hospital encounter of 08/17/13  WET PREP, GENITAL      Result Value Range   Yeast Wet Prep HPF POC NONE SEEN  NONE SEEN   Trich, Wet Prep NONE SEEN  NONE SEEN   Clue Cells Wet Prep HPF POC NONE SEEN  NONE SEEN   WBC, Wet Prep HPF POC FEW (*)  NONE SEEN  CBC WITH DIFFERENTIAL      Result Value Range   WBC 10.4  4.0 - 10.5 K/uL   RBC 4.79  3.87 - 5.11 MIL/uL   Hemoglobin 13.9  12.0 - 15.0 g/dL   HCT 40.9  81.1 - 91.4 %   MCV 84.3  78.0 - 100.0 fL   MCH 29.0  26.0 - 34.0 pg   MCHC 34.4  30.0 - 36.0 g/dL   RDW 78.2  95.6 - 21.3 %   Platelets 354  150 - 400 K/uL   Neutrophils Relative % 69  43 - 77 %   Neutro Abs 7.2  1.7 - 7.7  K/uL   Lymphocytes Relative 24  12 - 46 %   Lymphs Abs 2.4  0.7 - 4.0 K/uL   Monocytes Relative 6  3 - 12 %   Monocytes Absolute 0.6  0.1 - 1.0 K/uL   Eosinophils Relative 1  0 - 5 %   Eosinophils Absolute 0.1  0.0 - 0.7 K/uL   Basophils Relative 1  0 - 1 %   Basophils Absolute 0.1  0.0 - 0.1 K/uL  COMPREHENSIVE METABOLIC PANEL      Result Value Range   Sodium 138  135 - 145 mEq/L   Potassium 3.6  3.5 - 5.1 mEq/L   Chloride 102  96 - 112 mEq/L   CO2 21  19 - 32 mEq/L   Glucose, Bld 101 (*) 70 - 99 mg/dL   BUN 11  6 - 23 mg/dL   Creatinine, Ser 4.25  0.50 - 1.10 mg/dL   Calcium 9.6  8.4 - 95.6 mg/dL   Total Protein 8.2  6.0 - 8.3 g/dL   Albumin 4.2  3.5 - 5.2 g/dL   AST 15  0 - 37 U/L   ALT 18  0 - 35 U/L   Alkaline Phosphatase 79  39 - 117 U/L   Total Bilirubin 0.2 (*) 0.3 - 1.2 mg/dL   GFR calc non Af Amer >90  >90 mL/min   GFR calc Af Amer >90  >90 mL/min  LIPASE, BLOOD      Result Value Range   Lipase 28  11 - 59 U/L  URINALYSIS, ROUTINE W REFLEX MICROSCOPIC      Result Value Range   Color, Urine YELLOW  YELLOW   APPearance CLOUDY (*) CLEAR   Specific Gravity, Urine 1.026  1.005 - 1.030   pH 5.5  5.0 - 8.0   Glucose, UA NEGATIVE  NEGATIVE mg/dL   Hgb urine dipstick NEGATIVE  NEGATIVE   Bilirubin Urine NEGATIVE  NEGATIVE   Ketones, ur NEGATIVE  NEGATIVE mg/dL   Protein, ur NEGATIVE  NEGATIVE mg/dL   Urobilinogen, UA 0.2  0.0 - 1.0 mg/dL   Nitrite NEGATIVE  NEGATIVE   Leukocytes, UA TRACE (*) NEGATIVE  URINE MICROSCOPIC-ADD ON      Result Value Range   Squamous Epithelial /  LPF FEW (*) RARE   WBC, UA 0-2  <3 WBC/hpf   RBC / HPF 0-2  <3 RBC/hpf   Bacteria, UA FEW (*) RARE   Crystals CA OXALATE CRYSTALS (*) NEGATIVE   Urine-Other MUCOUS PRESENT    POCT PREGNANCY, URINE      Result Value Range   Preg Test, Ur NEGATIVE  NEGATIVE  OCCULT BLOOD, POC DEVICE      Result Value Range   Fecal Occult Bld NEGATIVE  NEGATIVE     MDM  No diagnosis found.  47 year old female presents emergency department with chief complaint of nausea vomiting diarrhea. She also states she has blood in her bowel movements. She denies fever or chills, urinary symptoms, or vaginal symptoms  ER workup is unremarkable to include blood work, urine, wet prep. Physical exam reveals mild generalized tenderness palpation however she has no tenderness to palpation at McBurney's point, negative Murphy's, and a nonsurgical abdomen. VS normal.  Benign pelvic and rectal exam.  Patient given 2 rounds of morphine and Zofran for her symptoms.   Pt did just receive antibiotics for pneumonia and completed yesterday.  Cdiff is clearly in differential and pt is aware of this.  Plan to treat symptomatically,  but if sx worsen or progress Cdiff should be tested for.  Pt is aware of this.    At 7:02 PM Plan to discharge patient home with symptomatic treatment of her symptoms. Plan to discharge with Zofran, Imodium, and pain medications  ER precautions were given for fever, chills, worsening symptoms or other concerns. At this point I do not suspect UTI, [place], serious bacterial illness, cdiff, surgical abdomen, appendicitis, or other emergent etiology.  Her H&H is normal, her guaiac was negative. I do not suspect life-threatening hemorrhage at this point.  Patient is aware she should return if symptoms worsen. At that point a CT abdomen pelvis and Cdiff studies would be prudent. Patient and her husband agree with the plan.   Darlys Gales, MD 08/17/13 2022

## 2013-08-19 LAB — GC/CHLAMYDIA PROBE AMP
CT Probe RNA: NEGATIVE
GC Probe RNA: NEGATIVE

## 2014-04-22 ENCOUNTER — Encounter (HOSPITAL_COMMUNITY): Payer: Self-pay | Admitting: Emergency Medicine

## 2014-04-22 ENCOUNTER — Emergency Department (HOSPITAL_COMMUNITY)
Admission: EM | Admit: 2014-04-22 | Discharge: 2014-04-22 | Disposition: A | Discharge status: Home / Self Care | Payer: Medicaid Other | Attending: Emergency Medicine | Admitting: Emergency Medicine

## 2014-04-22 ENCOUNTER — Emergency Department (HOSPITAL_COMMUNITY): Payer: Medicaid Other

## 2014-04-22 DIAGNOSIS — M129 Arthropathy, unspecified: Secondary | ICD-10-CM | POA: Diagnosis not present

## 2014-04-22 DIAGNOSIS — R05 Cough: Secondary | ICD-10-CM | POA: Diagnosis present

## 2014-04-22 DIAGNOSIS — R059 Cough, unspecified: Secondary | ICD-10-CM | POA: Diagnosis present

## 2014-04-22 DIAGNOSIS — J40 Bronchitis, not specified as acute or chronic: Secondary | ICD-10-CM

## 2014-04-22 DIAGNOSIS — J159 Unspecified bacterial pneumonia: Secondary | ICD-10-CM | POA: Diagnosis not present

## 2014-04-22 DIAGNOSIS — Z8679 Personal history of other diseases of the circulatory system: Secondary | ICD-10-CM | POA: Diagnosis not present

## 2014-04-22 DIAGNOSIS — J209 Acute bronchitis, unspecified: Secondary | ICD-10-CM | POA: Insufficient documentation

## 2014-04-22 DIAGNOSIS — Z8701 Personal history of pneumonia (recurrent): Secondary | ICD-10-CM | POA: Diagnosis not present

## 2014-04-22 DIAGNOSIS — F17200 Nicotine dependence, unspecified, uncomplicated: Secondary | ICD-10-CM | POA: Diagnosis not present

## 2014-04-22 DIAGNOSIS — J189 Pneumonia, unspecified organism: Secondary | ICD-10-CM

## 2014-04-22 LAB — CBC
HCT: 39.8 % (ref 36.0–46.0)
Hemoglobin: 13.1 g/dL (ref 12.0–15.0)
MCH: 27.5 pg (ref 26.0–34.0)
MCHC: 32.9 g/dL (ref 30.0–36.0)
MCV: 83.4 fL (ref 78.0–100.0)
PLATELETS: 358 10*3/uL (ref 150–400)
RBC: 4.77 MIL/uL (ref 3.87–5.11)
RDW: 16 % — ABNORMAL HIGH (ref 11.5–15.5)
WBC: 9.9 10*3/uL (ref 4.0–10.5)

## 2014-04-22 LAB — BASIC METABOLIC PANEL
ANION GAP: 20 — AB (ref 5–15)
BUN: 18 mg/dL (ref 6–23)
CHLORIDE: 99 meq/L (ref 96–112)
CO2: 20 mEq/L (ref 19–32)
CREATININE: 0.72 mg/dL (ref 0.50–1.10)
Calcium: 9.6 mg/dL (ref 8.4–10.5)
GFR calc Af Amer: >90 mL/min (ref >90)
Glucose, Bld: 127 mg/dL — ABNORMAL HIGH (ref 70–99)
Potassium: 4.1 mEq/L (ref 3.7–5.3)
Sodium: 139 mEq/L (ref 137–147)

## 2014-04-22 LAB — I-STAT TROPONIN, ED: Troponin i, poc: 0 ng/mL (ref 0.00–0.08)

## 2014-04-22 MED ORDER — AZITHROMYCIN 250 MG PO TABS: 250.0000 mg | ORAL_TABLET | Freq: Every day | ORAL | Status: DC

## 2014-04-22 MED ORDER — PREDNISONE 50 MG PO TABS: 50.0000 mg | ORAL_TABLET | Freq: Every day | ORAL | Status: DC

## 2014-04-22 MED ORDER — PREDNISONE 20 MG PO TABS
60.0000 mg | ORAL_TABLET | Freq: Once | ORAL | Status: AC
  Administered 2014-04-22: 60 mg via ORAL
  Filled 2014-04-22: qty 3

## 2014-04-22 MED ORDER — BENZONATATE 100 MG PO CAPS
200.0000 mg | ORAL_CAPSULE | Freq: Once | ORAL | Status: AC
  Administered 2014-04-22: 200 mg via ORAL
  Filled 2014-04-22: qty 2

## 2014-04-22 MED ORDER — AZITHROMYCIN 250 MG PO TABS
500.0000 mg | ORAL_TABLET | Freq: Once | ORAL | Status: AC
  Administered 2014-04-22: 500 mg via ORAL
  Filled 2014-04-22: qty 2

## 2014-04-22 MED ORDER — ALBUTEROL (5 MG/ML) CONTINUOUS INHALATION SOLN
10.0000 mg/h | INHALATION_SOLUTION | Freq: Once | RESPIRATORY_TRACT | Status: AC
  Administered 2014-04-22: 10 mg/h via RESPIRATORY_TRACT
  Filled 2014-04-22: qty 20

## 2014-04-22 MED ORDER — ACETAMINOPHEN 325 MG PO TABS
650.0000 mg | ORAL_TABLET | Freq: Once | ORAL | Status: AC
  Administered 2014-04-22: 650 mg via ORAL
  Filled 2014-04-22: qty 2

## 2014-04-22 MED ORDER — IPRATROPIUM BROMIDE 0.02 % IN SOLN
0.5000 mg | Freq: Once | RESPIRATORY_TRACT | Status: AC
  Administered 2014-04-22: 0.5 mg via RESPIRATORY_TRACT
  Filled 2014-04-22: qty 2.5

## 2014-04-22 MED ORDER — ALBUTEROL (5 MG/ML) CONTINUOUS INHALATION SOLN
10.0000 mg/h | INHALATION_SOLUTION | RESPIRATORY_TRACT | Status: DC
  Administered 2014-04-22: 10 mg/h via RESPIRATORY_TRACT

## 2014-04-22 MED ORDER — ALBUTEROL SULFATE HFA 108 (90 BASE) MCG/ACT IN AERS: 1.0000 | INHALATION_SPRAY | Freq: Four times a day (QID) | RESPIRATORY_TRACT | Status: DC | PRN

## 2014-04-22 MED ORDER — BENZONATATE 100 MG PO CAPS: 100.0000 mg | ORAL_CAPSULE | Freq: Three times a day (TID) | ORAL | Status: DC

## 2014-04-22 NOTE — ED Provider Notes (Signed)
CSN: [id number]     Arrival date & time 04/22/14  1620 History   First MD Initiated Contact with Patient 04/22/14 1654     Chief Complaint  Patient presents with  . Cough     (Consider location/radiation/quality/duration/timing/severity/associated sxs/prior Treatment) HPI Comments: SUBJECTIVE:  Christy Mayer is a 48 y.o. female with hx of palpitations, MVP, no known lung disease but extensive smoking hx who comes in with cc of cough. Pt has been having cough x 1 week. Pt is having green phlegm, and is having wheezing. No hx of COPD. No hx of DVT, PE and no risk factors for the same. Pt is having some pain in the back of her chest - and it is worse with coughing and breathing.   The history is provided by the patient.    Past Medical History  Diagnosis Date  . Mitral valve prolapse   . Heart palpitations   . Arthritis   . Back pain   . Pneumonia   . Miscarriage    Past Surgical History  Procedure Laterality Date  . Cesarean section  11/2007   Family History  Problem Relation Age of Onset  . Adopted: Yes   History  Substance Use Topics  . Smoking status: Current Every Day Smoker -- 0.50 packs/day    Types: Cigarettes  . Smokeless tobacco: Never Used  . Alcohol Use: No   OB History   Grav Para Term Preterm Abortions TAB SAB Ect Mult Living   6 4 4  2 1 1   4      Review of Systems  Constitutional: Positive for activity change.  HENT: Negative for facial swelling, rhinorrhea and sore throat.   Respiratory: Positive for cough, shortness of breath and wheezing.   Cardiovascular: Positive for chest pain.  Gastrointestinal: Negative for nausea, vomiting, abdominal pain, diarrhea, constipation, blood in stool and abdominal distention.  Genitourinary: Negative for dysuria, hematuria and difficulty urinating.  Musculoskeletal: Negative for neck pain.  Skin: Negative for color change.  Neurological: Negative for speech difficulty and headaches.  Hematological: Does not  bruise/bleed easily.  Psychiatric/Behavioral: Negative for confusion.      Allergies  Review of patient's allergies indicates no known allergies.  Home Medications   Prior to Admission medications   Medication Sig Start Date End Date Taking? Authorizing Provider  acetaminophen (TYLENOL) 500 MG tablet Take 1,000 mg by mouth every 6 (six) hours as needed. For pain   Yes Historical Provider, MD  diphenhydrAMINE (BENADRYL) 25 MG tablet Take 25 mg by mouth every 6 (six) hours as needed (for cough).   Yes Historical Provider, MD  albuterol (PROVENTIL HFA;VENTOLIN HFA) 108 (90 BASE) MCG/ACT inhaler Inhale 1-2 puffs into the lungs every 6 (six) hours as needed for wheezing or shortness of breath. 04/22/14   Derwood KaplanAnkit Sedric Guia, MD  azithromycin (ZITHROMAX) 250 MG tablet Take 1 tablet (250 mg total) by mouth daily. Take 1 tablet daily starting tomorrow until finished 04/22/14   Derwood KaplanAnkit Bentzion Dauria, MD  predniSONE (DELTASONE) 50 MG tablet Take 1 tablet (50 mg total) by mouth daily. 04/22/14   Vanya Carberry Rhunette CroftNanavati, MD   BP 117/66  Pulse 111  Temp(Src) 99.8 F (37.7 C) (Oral)  Resp 20  Ht 5\' 7"  (1.702 m)  Wt 200 lb (90.719 kg)  BMI 31.32 kg/m2  SpO2 95%  LMP 04/05/2014 Physical Exam  Nursing note and vitals reviewed. Constitutional: She is oriented to person, place, and time. She appears well-developed and well-nourished.  HENT:  Head: Normocephalic  and atraumatic.  Eyes: EOM are normal. Pupils are equal, round, and reactive to light.  Neck: Neck supple.  Cardiovascular: Normal rate, regular rhythm and normal heart sounds.   No murmur heard. Pulmonary/Chest: She is in respiratory distress. She has wheezes.  Abdominal: Soft. She exhibits no distension. There is no tenderness. There is no rebound and no guarding.  Neurological: She is alert and oriented to person, place, and time.  Skin: Skin is warm and dry.    ED Course  Procedures (including critical care time) Labs Review Labs Reviewed  CBC -  Abnormal; Notable for the following:    RDW 16.0 (*)    All other components within normal limits  BASIC METABOLIC PANEL - Abnormal; Notable for the following:    Glucose, Bld 127 (*)    Anion gap 20 (*)    All other components within normal limits  I-STAT TROPOININ, ED    Imaging Review Dg Chest 2 View  04/22/2014   CLINICAL DATA:  Shortness of breath with cough for 1 week. Generalized chest pain.  EXAM: CHEST  2 VIEW  COMPARISON:  CT and radiographs 08/08/2013.  FINDINGS: There is new airspace disease and volume loss in the right middle lobe with partial obscuration of the right heart border. The previously demonstrated right upper lobe airspace disease has resolved. The left lung is clear. There is no pleural effusion. The visualized heart size and mediastinal contours are stable.  IMPRESSION: New right middle lobe atelectasis or infiltrate. Radiographic follow up recommended to exclude an endobronchial lesion.   Electronically Signed   By: Roxy Horseman M.D.   On: 04/22/2014 17:18     EKG Interpretation   Date/Time:  Tuesday April 22 2014 16:25:42 EDT Ventricular Rate:  110 PR Interval:  126 QRS Duration: 86 QT Interval:  336 QTC Calculation: 454 R Axis:   64 Text Interpretation:  Sinus tachycardia Septal infarct , age undetermined  Abnormal ECG No new changes Confirmed by Rhunette Croft, MD, Janey Genta [phone]) on  04/22/2014 4:54:51 PM      MDM   Final diagnoses:  CAP (community acquired pneumonia)  Bronchitis   Pt with cc of cough. Exam shows diffuse wheezing. Pt given 1 hour breathing tx - and her wheezing got worse. CXR shows right sided infiltrate - ? CAP given the hx. PT is a smoker - smoking cessation advised.  Pt given 2nd hour of breathing tx - and she improved. She is able to ambulate without getting hypoxic. DDX: CAP with possible undiagnosed COPD exacerbation.  Will discharge.  Smoking cessation instruction/counseling given:  counseled patient on the dangers of  tobacco use, advised patient to stop smoking, and reviewed strategies to maximize success. Discussion - 2-3 minutes.  CRITICAL CARE Performed by: Derwood Kaplan   Total critical care time: 45 minutes - repeat evaluation for asthma exacerbation/bronchitis.  Critical care time was exclusive of separately billable procedures and treating other patients.  Critical care was necessary to treat or prevent imminent or life-threatening deterioration.  Critical care was time spent personally by me on the following activities: development of treatment plan with patient and/or surrogate as well as nursing, discussions with consultants, evaluation of patient's response to treatment, examination of patient, obtaining history from patient or surrogate, ordering and performing treatments and interventions, ordering and review of laboratory studies, ordering and review of radiographic studies, pulse oximetry and re-evaluation of patient's condition.    Derwood Kaplan, MD 04/22/14 2108

## 2014-04-22 NOTE — Discharge Instructions (Signed)
We saw you in the ER for your cough and shortness of breath.  We gave you some breathing treatments in the ER, and seems like your symptoms have improved. Please take albuterol as needed every 4 hours. Please take the medications prescribed. Please refrain from smoking or smoke exposure. Please see a primary care doctor in 1 week. Return to the ER if your symptoms worsen.   RESOURCE GUIDE  Chronic Pain Problems: Contact [facility]  [phone] Patients need to be referred by their primary care doctor.  Insufficient Money for Medicine: Contact [employer] Way:  call "211."   No Primary Care Doctor: - Call [facility]  [phone] - can help you locate a primary care doctor that  accepts your insurance, provides certain services, etc. - Physician Referral Service- [phone]  Agencies that provide inexpensive medical care: - [facility]  [phone] - [facility]  [phone] - [facility]  [phone] - [facility]  [phone] - [facility]  [phone] [facility]  [phone]  Medicaid-accepting [facility] Providers: - [facility]- [address]  [phone], Mon-Fri 9am-7pm, Sat 9am-1pm - [facility]- [address] [place], [address] [place]  [phone] - [facility]- [address] [place]  [phone] [facility]- [address]  [phone] - Renaye Rakers- [address] [place], [address], [phone]  Only accepts [place] patients after they have their name  applied to their card  Self Pay (no insurance) in [place]: - Sickle Cell Patients: Dr Willey Blade, [facility]  [address] [place] [address] [place], [phone] - [facility]- [address] [place]  [phone]       [facility] [place]- [address]       -     [facility]- see information  above (Speak to [employer] if you do not have insurance)       -  [facility]- [address],  [phone]       -  [facility]- [address], [phone]       -  Dr Julio Sicks-  [address], [place], [phone]       -  [facility] - [address] [phone]       -  [facility]- [address] [phone], also [address], [phone]       -    [facility]- [address] [place], [phone], 1st & 3rd Saturday        every month, 10am-1pm  [facility] [address] [place], [place] [address] [phone]  [facility] [address] [place], [place] [address] [phone]  1) Find a Doctor and Pay Out of Pocket Although you won't have to find out who is covered by your insurance plan, it is a good idea to ask around and get recommendations. You will then need to call the office and see if the doctor you have chosen will accept you as a new patient and what types of options they offer for patients who are self-pay. Some doctors offer discounts or will set up payment plans for their patients who do not have insurance, but you will need  to ask so you aren't surprised when you get to your appointment.  2) Contact Your Local Health Department Not all health departments have doctors that can see patients for sick visits, but many do, so it is worth a call to see if yours does. If you don't know where your local health department is, you can check in your phone book. The CDC also has a tool to help you locate your state's health department, and many state websites also have listings of all of their local health departments.  3) Find a Walk-in Clinic If your illness is not likely to be very severe or complicated, you may want to try a walk in clinic. These are popping up all over the country in pharmacies, drugstores, and shopping centers. They're usually staffed by  nurse practitioners or physician assistants that have been trained to treat common illnesses and complaints. They're usually fairly quick and inexpensive. However, if you have serious medical issues or chronic medical problems, these are probably not your best option  STD Testing - [facility] of [facility], STD Clinic, [address], [place], phone [phone] or [phone].  Monday - Friday, call for an appointment. [facility] Department of [employer], STD Clinic, [address], [place], phone [phone] or [phone].  Monday - Friday, call for an appointment.  Abuse/Neglect: [facility] Child Abuse Hotline 973-620-3564 [facility] Child Abuse Hotline 248-462-6339 (After Hours)  Emergency Shelter:  [facility] [phone]  Maternity Homes: - [facility] [phone] - [facility] 4191649469  MRSA Hotline #:   806-111-5839  Dental Assistance If unable to pay or uninsured, contact:  [facility] to become qualified for the adult dental clinic.  Patients with Medicaid: [facility] [phone] [address], [phone] [address] [phone]  If unable to pay, or uninsured, contact [facility] [phone] in [place], [phone] in [facility]) to become qualified for the adult dental clinic  [facility] [address] [place], [place] [address] [phone] [email or web address]  Other Proofreader Services: - [facility]- [address] [place], [place], [place], [address], [phone], Ext. [phone], 2nd and 4th Thursday of the month at 6:30am.  10 clients each day by appointment, can sometimes see walk-in patients if someone does not show for an appointment. [facility]- [address] [place], [place], [address], [phone] - [facility]- [address], [place], [place], [address], [phone] [facility] Health Department- [phone] [facility] Health Department- [phone] [facility] [phone]          Bronchitis Bronchitis is inflammation of the airways that extend from the windpipe into the lungs (bronchi). The inflammation often causes mucus to develop, which leads to a cough. If the inflammation becomes severe, it may cause shortness of breath. CAUSES  Bronchitis may be caused by:   Viral infections.   Bacteria.   Cigarette smoke.   Allergens, pollutants, and other irritants.  SIGNS AND SYMPTOMS  The most common symptom of bronchitis is a frequent cough that produces mucus. Other symptoms include:  Fever.   Body aches.   Chest congestion.   Chills.   Shortness of breath.   Sore throat.  DIAGNOSIS  Bronchitis is usually diagnosed through a medical history and physical exam. Tests, such as chest X-rays, are sometimes  done to rule out other conditions.  TREATMENT  You may need to avoid contact with whatever caused the problem (smoking, for example). Medicines are sometimes needed. These may include:  Antibiotics. These may be prescribed if the condition is caused by bacteria.  Cough suppressants. These may be prescribed for relief of cough symptoms.   Inhaled medicines. These may be prescribed to help open your airways and make it easier for you to breathe.   Steroid medicines. These may be prescribed for those with recurrent (chronic) bronchitis. HOME CARE INSTRUCTIONS  Get plenty of rest.   Drink enough fluids to keep your urine clear or pale yellow (unless you have a medical condition that requires fluid restriction). Increasing fluids may help thin your secretions and will prevent dehydration.   Only take over-the-counter or prescription medicines as directed by your health care provider.  Only take antibiotics as directed. Make sure you finish  them even if you start to feel better.  Avoid secondhand smoke, irritating chemicals, and strong fumes. These will make bronchitis worse. If you are a smoker, quit smoking. Consider using nicotine gum or skin patches to help control withdrawal symptoms. Quitting smoking will help your lungs heal faster.   Put a cool-mist humidifier in your bedroom at night to moisten the air. This may help loosen mucus. Change the water in the humidifier daily. You can also run the hot water in your shower and sit in the bathroom with the door closed for 5-10 minutes.   Follow up with your health care provider as directed.   Wash your hands frequently to avoid catching bronchitis again or spreading an infection to others.  SEEK MEDICAL CARE IF: Your symptoms do not improve after 1 week of treatment.  SEEK IMMEDIATE MEDICAL CARE IF:  Your fever increases.  You have chills.   You have chest pain.   You have worsening shortness of breath.   You have bloody sputum.  You faint.  You have lightheadedness.  You have a severe headache.   You vomit repeatedly. MAKE SURE YOU:   Understand these instructions.  Will watch your condition.  Will get help right away if you are not doing well or get worse. Document Released: 09/26/2005 Document Revised: 07/17/2013 Document Reviewed: 05/21/2013 [facility] Patient Information 2015 [place]. This information is not intended to replace advice given to you by your health care provider. Make sure you discuss any questions you have with your health care provider.  Pneumonia, Adult Pneumonia is an infection of the lungs. It may be caused by a germ (virus or bacteria). Some types of pneumonia can spread easily from person to person. This can happen when you cough or sneeze. HOME CARE  Only take medicine as told by your doctor.  Take your medicine (antibiotics) as told. Finish it even if you start to feel better.  Do not smoke.  You may use a  vaporizer or humidifier in your room. This can help loosen thick spit (mucus).  Sleep so you are almost sitting up (semi-upright). This helps reduce coughing.  Rest. A shot (vaccine) can help prevent pneumonia. Shots are often advised for:  People over 565 years old.  Patients on chemotherapy.  People with long-term (chronic) lung problems.  People with immune system problems. GET HELP RIGHT AWAY IF:   You are getting worse.  You cannot control your cough, and you are losing sleep.  You cough up blood.  Your pain gets worse, even with medicine.  You have  a fever.  Any of your problems are getting worse, not better.  You have shortness of breath or chest pain. MAKE SURE YOU:   Understand these instructions.  Will watch your condition.  Will get help right away if you are not doing well or get worse. Document Released: 03/14/2008 Document Revised: 12/19/2011 Document Reviewed: 12/17/2010 [facility] Patient Information 2015 [place], [place]. This information is not intended to replace advice given to you by your health care provider. Make sure you discuss any questions you have with your health care provider.

## 2014-04-22 NOTE — ED Notes (Signed)
Patient 02 was 92% while walking once returned back to room to sit on bed it was 90% resting on ra.

## 2014-04-22 NOTE — ED Notes (Signed)
Pt. Having cold symptoms since July 5th.  Pt. Having congested cough and weakness. Also having chills and hot spells.   Pt. Also reports nasal congestion and green sputum. Pt. Is alert and oriented X3.  Pt. Is in NAD

## 2014-05-13 ENCOUNTER — Emergency Department (HOSPITAL_COMMUNITY)
Admission: EM | Admit: 2014-05-13 | Discharge: 2014-05-13 | Disposition: A | Discharge status: Home / Self Care | Payer: Medicaid Other | Attending: Emergency Medicine | Admitting: Emergency Medicine

## 2014-05-13 ENCOUNTER — Encounter (HOSPITAL_COMMUNITY): Payer: Self-pay | Admitting: Emergency Medicine

## 2014-05-13 ENCOUNTER — Emergency Department (HOSPITAL_COMMUNITY): Payer: Medicaid Other

## 2014-05-13 DIAGNOSIS — Z8679 Personal history of other diseases of the circulatory system: Secondary | ICD-10-CM | POA: Diagnosis not present

## 2014-05-13 DIAGNOSIS — M129 Arthropathy, unspecified: Secondary | ICD-10-CM | POA: Insufficient documentation

## 2014-05-13 DIAGNOSIS — Y9389 Activity, other specified: Secondary | ICD-10-CM | POA: Diagnosis not present

## 2014-05-13 DIAGNOSIS — S60229A Contusion of unspecified hand, initial encounter: Secondary | ICD-10-CM | POA: Diagnosis not present

## 2014-05-13 DIAGNOSIS — Z792 Long term (current) use of antibiotics: Secondary | ICD-10-CM | POA: Diagnosis not present

## 2014-05-13 DIAGNOSIS — F17200 Nicotine dependence, unspecified, uncomplicated: Secondary | ICD-10-CM | POA: Insufficient documentation

## 2014-05-13 DIAGNOSIS — Z8701 Personal history of pneumonia (recurrent): Secondary | ICD-10-CM | POA: Insufficient documentation

## 2014-05-13 DIAGNOSIS — Y9289 Other specified places as the place of occurrence of the external cause: Secondary | ICD-10-CM | POA: Insufficient documentation

## 2014-05-13 DIAGNOSIS — S60221A Contusion of right hand, initial encounter: Secondary | ICD-10-CM

## 2014-05-13 DIAGNOSIS — S6990XA Unspecified injury of unspecified wrist, hand and finger(s), initial encounter: Secondary | ICD-10-CM | POA: Diagnosis present

## 2014-05-13 DIAGNOSIS — IMO0002 Reserved for concepts with insufficient information to code with codable children: Secondary | ICD-10-CM | POA: Diagnosis not present

## 2014-05-13 MED ORDER — IBUPROFEN 800 MG PO TABS: 800.0000 mg | ORAL_TABLET | Freq: Three times a day (TID) | ORAL | Status: DC

## 2014-05-13 MED ORDER — HYDROCODONE-ACETAMINOPHEN 5-325 MG PO TABS: 1.0000 | ORAL_TABLET | ORAL | Status: DC | PRN

## 2014-05-13 MED ORDER — OXYCODONE-ACETAMINOPHEN 5-325 MG PO TABS
2.0000 | ORAL_TABLET | Freq: Once | ORAL | Status: AC
  Administered 2014-05-13: 2 via ORAL
  Filled 2014-05-13: qty 2

## 2014-05-13 NOTE — Discharge Instructions (Signed)
Call Dr. Kevan NyGates or another orthopedist for further evaluation of your hand pain. Do not remove the splint until you're advised to buy a hand specialist. Call for a follow up appointment with a Family or Primary Care Provider.  Return if Symptoms worsen.   Take medication as prescribed.  Elevate your hand and ice 3-4 times a day. Do not operate heavy machinery or drink alcohol while taking your narcotic pain medication.

## 2014-05-13 NOTE — ED Notes (Signed)
Declined W/C at D/C and was escorted to lobby by RN. 

## 2014-05-13 NOTE — ED Notes (Signed)
Pt hit a a solid wooden door with her right hand, hand is swollen and painful.

## 2014-05-13 NOTE — ED Provider Notes (Signed)
CSN: [id number]     Arrival date & time 05/13/14  1851 History   This chart was scribed for non-physician practitioner working with Hurman Horn, MD, by Modena Jansky, ED Scribe. This patient was seen in room TR06C/TR06C and the patient's care was started at 6:59 PM.   Chief Complaint  Patient presents with  . Hand Injury   HPI Comments: Christy Mayer is a 48 y.o. female who presents to the Emergency Department complaining of a right hand injury. She states that she hit her hand against a door. She reports associated right hand pain. She reports that same broke the same hand a few years ago and it did not heal properly. Ortho in [place].  The history is provided by the patient. No language interpreter was used.   HPI Comments: Christy Mayer is a 48 y.o. female who presents to the Emergency Department complaining of a right hand injury. She states that she hit her hand against a door. She reports associated right hand pain. She reports that same broke the same hand a few years ago and it did not heal properly.   Past Medical History  Diagnosis Date  . Mitral valve prolapse   . Heart palpitations   . Arthritis   . Back pain   . Pneumonia   . Miscarriage    Past Surgical History  Procedure Laterality Date  . Cesarean section  11/2007   Family History  Problem Relation Age of Onset  . Adopted: Yes   History  Substance Use Topics  . Smoking status: Current Every Day Smoker -- 0.50 packs/day    Types: Cigarettes  . Smokeless tobacco: Never Used  . Alcohol Use: No   OB History   Grav Para Term Preterm Abortions TAB SAB Ect Mult Living   6 4 4  2 1 1   4      Review of Systems  Musculoskeletal: Positive for arthralgias and joint swelling.  Skin: Positive for color change.      Allergies  Review of patient's allergies indicates no known allergies.  Home Medications   Prior to Admission medications   Medication Sig Start Date End Date Taking? Authorizing Provider   acetaminophen (TYLENOL) 500 MG tablet Take 1,000 mg by mouth every 6 (six) hours as needed. For pain    Historical Provider, MD  albuterol (PROVENTIL HFA;VENTOLIN HFA) 108 (90 BASE) MCG/ACT inhaler Inhale 1-2 puffs into the lungs every 6 (six) hours as needed for wheezing or shortness of breath. 04/22/14   Derwood Kaplan, MD  azithromycin (ZITHROMAX) 250 MG tablet Take 1 tablet (250 mg total) by mouth daily. Take 1 tablet daily starting tomorrow until finished 04/22/14   Derwood Kaplan, MD  benzonatate (TESSALON) 100 MG capsule Take 1 capsule (100 mg total) by mouth every 8 (eight) hours. 04/22/14   Derwood Kaplan, MD  diphenhydrAMINE (BENADRYL) 25 MG tablet Take 25 mg by mouth every 6 (six) hours as needed (for cough).    Historical Provider, MD  predniSONE (DELTASONE) 50 MG tablet Take 1 tablet (50 mg total) by mouth daily. 04/22/14   Ankit Rhunette Croft, MD   BP 119/72  Pulse 94  Temp(Src) 98.8 F (37.1 C) (Oral)  Resp 16  SpO2 98%  LMP 04/12/2014 Physical Exam  Nursing note and vitals reviewed. Constitutional: She is oriented to person, place, and time. She appears well-developed and well-nourished. No distress.  HENT:  Head: Normocephalic and atraumatic.  Neck: Neck supple.  Pulmonary/Chest: Effort normal. No respiratory  distress.  Musculoskeletal:       Right hand: She exhibits decreased range of motion and bony tenderness. She exhibits normal capillary refill.  Loss of fifth knuckle contour, with associated tenderness to palpation and overlying swelling.  Right wrist with tender to palpation over the ulnar head. Mild  anatomical snuffbox tenderness with palpation. Normal sensation to fingers, good cap refill, able to flex and extend fingers with discomfort.   Neurological: She is alert and oriented to person, place, and time.  Skin: Skin is warm and dry. She is not diaphoretic.  Psychiatric: She has a normal mood and affect. Her behavior is normal.    ED Course  Procedures (including  critical care time)  COORDINATION OF CARE: 7:03 PM- Pt advised of plan for treatment which medication, radiology, and labs and pt agrees.  Labs Review Labs Reviewed - No data to display  Imaging Review Dg Wrist Complete Right  05/13/2014   CLINICAL DATA:  Pain, trauma.  EXAM: RIGHT WRIST - COMPLETE 3+ VIEW  COMPARISON:  11/14/2008  FINDINGS: There is no evidence of fracture or dislocation. There is no evidence of arthropathy or other focal bone abnormality. Soft tissues are unremarkable.  IMPRESSION: No evidence for acute fracture.  If the patient's tenderness is in the vicinity of the scaphoid/anatomic snuffbox, then cross-sectional imaging or presumptive treatment for occult scaphoid fracture might be considered.   Electronically Signed   By: Annia Beltrew  Davis M.D.   On: 05/13/2014 19:29   Dg Hand Complete Right  05/13/2014   CLINICAL DATA:  Lateral hand and wrist pain. History of prior fifth metacarpal fracture.  EXAM: RIGHT HAND - COMPLETE 3+ VIEW  COMPARISON:  And radiograph 11/14/2008  FINDINGS: Sequelae of healed distal fifth metacarpal fracture. No evidence for acute osseous abnormality. Regional soft tissues unremarkable. Normal anatomic alignment.  IMPRESSION: No acute osseous abnormality.   Electronically Signed   By: Annia Beltrew  Davis M.D.   On: 05/13/2014 19:28     EKG Interpretation None      MDM   Final diagnoses:  Contusion of right hand, initial encounter   Pt with contusion to right hand, moderate amount of swelling and ecchymosis. X-ray without acute findings, healing distal fifth metacarpal fracture seen on exam. Patient is tender over the anatomic snuff box, will discharge with splint and hand follow up. Discussed imaging results, and treatment plan with the patient. Return precautions given. Reports understanding and no other concerns at this time.  Patient is stable for discharge at this time.  Meds given in ED:  Medications  oxyCODONE-acetaminophen (PERCOCET/ROXICET) 5-325  MG per tablet 2 tablet (2 tablets Oral Given 05/13/14 1909)    Discharge Medication List as of 05/13/2014  7:53 PM    START taking these medications   Details  HYDROcodone-acetaminophen (NORCO/VICODIN) 5-325 MG per tablet Take 1 tablet by mouth every 4 (four) hours as needed for moderate pain or severe pain., Starting 05/13/2014, Until Discontinued, Print    ibuprofen (ADVIL,MOTRIN) 800 MG tablet Take 1 tablet (800 mg total) by mouth 3 (three) times daily with meals., Starting 05/13/2014, Until Discontinued, Print       I personally performed the services described in this documentation, which was scribed in my presence. The recorded information has been reviewed and is accurate.     Mellody DrownLauren Breannah Kratt, PA-C 05/15/14 [phone]

## 2014-05-27 NOTE — ED Provider Notes (Signed)
Medical screening examination/treatment/procedure(s) were performed by non-physician practitioner and as supervising physician I was immediately available for consultation/collaboration.   EKG Interpretation None       Christy HornJohn M Almadelia Looman, MD 05/27/14 [id number]

## 2014-08-11 ENCOUNTER — Encounter (HOSPITAL_COMMUNITY): Payer: Self-pay | Admitting: Emergency Medicine

## 2014-11-28 ENCOUNTER — Ambulatory Visit: Payer: Self-pay

## 2014-12-01 ENCOUNTER — Ambulatory Visit: Payer: Self-pay | Admitting: Family Medicine

## 2014-12-07 ENCOUNTER — Inpatient Hospital Stay: Payer: Self-pay | Admitting: Internal Medicine

## 2014-12-18 ENCOUNTER — Inpatient Hospital Stay: Payer: Medicaid Other | Admitting: Internal Medicine

## 2014-12-24 ENCOUNTER — Inpatient Hospital Stay: Payer: Medicaid Other | Admitting: Internal Medicine

## 2015-01-05 ENCOUNTER — Encounter (INDEPENDENT_AMBULATORY_CARE_PROVIDER_SITE_OTHER): Payer: Self-pay

## 2015-01-05 ENCOUNTER — Encounter: Payer: Self-pay | Admitting: Internal Medicine

## 2015-01-05 ENCOUNTER — Ambulatory Visit (INDEPENDENT_AMBULATORY_CARE_PROVIDER_SITE_OTHER): Payer: Medicaid Other | Admitting: Internal Medicine

## 2015-01-05 VITALS — BP 126/86 | HR 90 | Temp 99.1°F | Ht 67.0 in | Wt 230.0 lb

## 2015-01-05 DIAGNOSIS — R059 Cough, unspecified: Secondary | ICD-10-CM | POA: Insufficient documentation

## 2015-01-05 DIAGNOSIS — F17200 Nicotine dependence, unspecified, uncomplicated: Secondary | ICD-10-CM | POA: Insufficient documentation

## 2015-01-05 DIAGNOSIS — I341 Nonrheumatic mitral (valve) prolapse: Secondary | ICD-10-CM

## 2015-01-05 DIAGNOSIS — R0600 Dyspnea, unspecified: Secondary | ICD-10-CM | POA: Diagnosis not present

## 2015-01-05 DIAGNOSIS — R05 Cough: Secondary | ICD-10-CM

## 2015-01-05 DIAGNOSIS — J189 Pneumonia, unspecified organism: Secondary | ICD-10-CM

## 2015-01-05 DIAGNOSIS — Z720 Tobacco use: Secondary | ICD-10-CM | POA: Diagnosis not present

## 2015-01-05 MED ORDER — TRAMADOL HCL 50 MG PO TABS: 50.0000 mg | ORAL_TABLET | Freq: Four times a day (QID) | ORAL | Status: AC | PRN

## 2015-01-05 MED ORDER — NICOTINE 14 MG/24HR TD PT24: 14.0000 mg | MEDICATED_PATCH | Freq: Every day | TRANSDERMAL | Status: DC

## 2015-01-05 MED ORDER — ALBUTEROL SULFATE HFA 108 (90 BASE) MCG/ACT IN AERS: 2.0000 | INHALATION_SPRAY | RESPIRATORY_TRACT | Status: DC | PRN

## 2015-01-05 MED ORDER — FLUTICASONE-SALMETEROL 250-50 MCG/DOSE IN AEPB: 1.0000 | INHALATION_SPRAY | Freq: Two times a day (BID) | RESPIRATORY_TRACT | Status: DC

## 2015-01-05 NOTE — Progress Notes (Signed)
Date: 01/05/2015  MRN# [id number] Christy Mayer 1966/01/18  Referring Physician: hospital follow up  Christy Mayer is a 49 y.o. old female seen in consultation for hospital follow up of pneumonia (RML)  CC:  Chief Complaint  Patient presents with  . Hospitalization Follow-up    Breathing is not better since leaving the hospital. Reports SOB, dry cough and chest tightness.     HPI:  Patient is a pleasant 49 year old female past medical history of smoking, hypertension, that was recently seen in the hospital for multilobar pneumonia and is presenting today for hospital followup. For full details of recent hospitalization see below.  Brief hospitalization, the patient is having symptoms of shortness of breath, cough, productive sputum, for one week as an outpatient which progressively got worse. She presented to the hospital on 01/04/2015, found to have bilateral pneumonia on imaging studies, she was admitted to the inpatient services, did not havea significant improvement, which prompted a inpatient pulmonary consult with Dr. Belia Heman, who performed a bronchoscopy (notable for thick purulent sputum, mucous, mainly on the right side), she was started on Rocephin, Zithromax, and Levaquin. After one week of hospitalization with gradual improvement patient was discharged with Augmentin and prednisone taper. Today patient presents for a followup visit, she was also discharged on discharge on advair, albuterol, and spiriva, given her history of smoking. Stopped Spiriva due to leg cramps, once stopped, this resolved. Now with cough, barking quality, non productive. Using 3 pillows at night to sleep, has some mild ankle swelling.  Using albuterol 8-10 times since discharge - using it for shallow breathing  6 months ago can walk 3-4 miles.  Still smoking up to 0.5 ppd day, and using albuterol. Husband smokes also.  Pets at home - 2 dogs and 2 cats Has a yearly "pneumonia\bronchitis," last about a week, but  usually does not require a doctor's visit.  Over the past year had 3 such episodes, last one requiring hospitalization.   [facility] hospitalization summary DATE OF ADMISSION:  12/07/2014 DATE OF DISCHARGE:  12/10/2014  DISCHARGE DIAGNOSES:  1.  Multilobar pneumonia.  2.  Anxiety.  3.  Chronic obstructive pulmonary disease, new diagnosis.  DISCHARGE MEDICATIONS:  1.  Aleve 220 mg q. 8 hours p.r.n. for pain.  2.  ProAir inhaler 2 puffs 4 times a day as needed.  3.  Prednisone taper for 3 more days.  4.  Percocet 10/325 mg 1 tablet q. 6 hours p.r.n. for severe pain.  5.  Xanax 0.5 mg q. 8 hours p.r.n. for anxiety.  6.  Robitussin AC 10 mL q. 4 hours p.r.n. for cough.  7.  Spiriva HandiHaler 1 capsule inhalation daily.  8.  Advair 250/50 one puff b.i.d.  9.  Norvasc 2.5 mg p.o. daily.  10.  Augmentin 800 mg tablet 1 tablet p.o. b.i.d. for 6 more days.  [name] is a 49 year old Caucasian female with no significant past medical history other than anxiety and smoking, presents to the hospital secondary to worsening cough and difficulty breathing and failing outpatient treatment for pneumonia.  1.  Multilobar pneumonia: The patient was treated with Levaquin based on her symptoms as an outpatient for 1 week; however, symptoms did not improve. She continued to have dyspnea and worsening cough. She was admitted as an observation initially, started on Rocephin and azithromycin. Because her symptoms were not improving, she had a CT of her chest, which showed right middle lobe pneumonia. She was seen by pulmonologist, Dr. Belia Heman, who felt that  she could have underlying COPD, so started her on inhalers and prednisone taper as well. The patient had a bronchoscopy, which actually showed not just right-sided but bilateral pneumonia and significant purulent secretions coming from the right lobe. Most of the secretions were suctioned out, washed, and sent for cultures. At this time, the culture is growing only  gram-positive rods; however, the patient has been on Rocephin and azithromycin and also Levaquin as an outpatient. She is being discharged on Augmentin at this time and will have pulmonary follow her up as an outpatient.  2.  Hypertension: Low-dose amlodipine has been added for her blood pressure in the hospital.    3.  Anxiety: Started on Xanax as needed.  4.  COPD: She is on prednisone taper and inhalers, as mentioned above.  PMHX:   Past Medical History  Diagnosis Date  . Mitral valve prolapse   . Heart palpitations   . Arthritis   . Back pain   . Pneumonia   . Miscarriage    Surgical Hx:  Past Surgical History  Procedure Laterality Date  . Cesarean section  11/2007   Family Hx:  Family History  Problem Relation Age of Onset  . Adopted: Yes   Social Hx:   History  Substance Use Topics  . Smoking status: Current Every Day Smoker -- 0.50 packs/day for 25 years    Types: Cigarettes  . Smokeless tobacco: Never Used  . Alcohol Use: No   Medication:   Current Outpatient Rx  Name  Route  Sig  Dispense  Refill  . acetaminophen (TYLENOL) 500 MG tablet   Oral   Take 1,000 mg by mouth every 6 (six) hours as needed. For pain         . albuterol (PROVENTIL HFA;VENTOLIN HFA) 108 (90 BASE) MCG/ACT inhaler   Inhalation   Inhale 1-2 puffs into the lungs every 6 (six) hours as needed for wheezing or shortness of breath.   1 Inhaler   0   . ALPRAZolam (XANAX) 0.5 MG tablet   Oral   Take 0.5 mg by mouth 3 (three) times daily as needed for anxiety.         Marland Kitchen. amLODipine (NORVASC) 10 MG tablet   Oral   Take 10 mg by mouth daily.         . diphenhydrAMINE (BENADRYL) 25 MG tablet   Oral   Take 25 mg by mouth every 6 (six) hours as needed (for cough).         . Fluticasone-Salmeterol (ADVAIR) 250-50 MCG/DOSE AEPB   Inhalation   Inhale 1 puff into the lungs 2 (two) times daily.         Marland Kitchen. ibuprofen (ADVIL,MOTRIN) 800 MG tablet   Oral   Take 1 tablet (800 mg total) by  mouth 3 (three) times daily with meals.   21 tablet   0       Allergies:  Review of patient's allergies indicates no known allergies.  Review of Systems: Gen:  Denies  fever, sweats, chills HEENT: Denies blurred vision, double vision, ear pain, eye pain, hearing loss, nose bleeds, sore throat Cvc:  No dizziness, chest pain or heaviness Resp:   Cough, dyspnea on exertion Gi: Denies swallowing difficulty, stomach pain, nausea or vomiting, diarrhea, constipation, bowel incontinence Gu:  Denies bladder incontinence, burning urine Ext:   No Joint pain, stiffness. Admits to mild ankle swelling Skin: No skin rash, easy bruising or bleeding or hives Endoc:  No polyuria, polydipsia ,  polyphagia or weight change Psych: No depression, insomnia or hallucinations  Other:  All other systems negative  Physical Examination:   VS: BP 126/86 mmHg  Pulse 90  Temp(Src) 99.1 F (37.3 C) (Oral)  Ht 5\' 7"  (1.702 m)  Wt 230 lb (104.327 kg)  BMI 36.01 kg/m2  SpO2 97%  General Appearance: No distress  Neuro:without focal findings, mental status, speech normal, alert and oriented, cranial nerves 2-12 intact, reflexes normal and symmetric, sensation grossly normal  HEENT: PERRLA, EOM intact, no ptosis, no other lesions noticed; Mallampati 2 Pulmonary: coarse upper airway sounds, with transmission to the lobes, no acute wheezes, no rales, no crackles. Coughing during examination, no wheezes noted. CardiovascularNormal S1,S2.  No m/r/g.  Abdominal aorta pulsation normal.    Abdomen: Benign, Soft, non-tender, No masses, hepatosplenomegaly, No lymphadenopathy Renal:  No costovertebral tenderness  GU:  No performed at this time. Endoc: No evident thyromegaly, no signs of acromegaly or Cushing features Skin:   warm, no rashes, no ecchymosis  Extremities: normal, no cyanosis, clubbing, warm with normal capillary refill. Other findings:trace pedal edema   Rad results: (The following images and results were  reviewed by Dr. Dema Severin). CT Chest 11/2014 CT CHEST WITH CONTRAST  TECHNIQUE: Multidetector CT imaging of the chest was performed during intravenous contrast administration.  CONTRAST:  75 mL Omnipaque 300 IV COMPARISON:  Chest radiographs dated 12/05/2014. CT chest dated 08/08/2013.  FINDINGS: Mediastinum/Nodes: The heart is normal in size. No pericardial effusion.  No suspicious mediastinal, hilar, or axillary lymphadenopathy.  Visualized thyroid is unremarkable.  Lungs/Pleura: Right middle lobe collapse with volume loss and air bronchograms, suspicious for right middle lobe pneumonia, less likely atelectasis secondary to mucous plugging. Mild scarring/atelectasis at the lung bases.  No suspicious pulmonary nodules.  No pleural effusion or pneumothorax.  Upper abdomen: Visualized upper abdomen is unremarkable.  Musculoskeletal: Mild degenerative changes of the mid thoracic spine.   IMPRESSION: Right middle lobe collapse, suspicious for right middle lobe pneumonia, less likely atelectasis secondary to mucous plugging.   Assessment and Plan:48 year old female presenting for followup hospitalization for pneumonia, right middle lobe. Pneumonia, organism unspecified Patient with treated community acquired pneumonia. Treated with inpatient Rocephin, Levaquin, and had a bronchoscopy with BAL. CT scan showed right middle lobe pneumonia with possible collapse secondary to mucous plugging, atelectasis.  BAL cultures Fungal- Penicillium species - sent [facility] the state lab on 12/22/2014, awaiting final results Micro- appears to be normal flora at 48 hours, final results 12/09/2014  Patient currently in the resolving phase of the recent pneumonia. Will follow up on final results of fungal cultures, hopefully these will be ready at her next visit. She does have a post infectious cough which will be treated with Delsym and tramadol.  Plan: -Continue with incentive spirometry,  exercise, avoid tobacco use.      Cough Postinfectious cough secondary to recent pneumonia (right middle lobe, community acquired).  Plan: Cough Suppresion Take delsym two tsp every 12 hours and supplement if needed with  tramadol 50 mg up to 2 every 4 hours to suppress the urge to cough. Swallowing water or using ice chips/non mint and menthol containing candies (such as lifesavers or sugarless jolly ranchers) are also effective.  You should rest your voice and avoid activities that you know make you cough.  Once you have eliminated the cough for 3 straight days try reducing the tramadol first,  then the delsym as tolerated.   Dyspnea Multifactorial: Recent hospitalization, recent pneumonia, deconditioning, tobacco abuse, obstructive versus  restrictive disease.  I believe the dyspnea secondary to mostly deconditioning and recent pneumonia, however given that she had does have some orthopnea and pedal edema will further evaluate with 2-D echo.   Plan: - pulmonary function testing and 6 minute walk test prior to follow up - albuterol inhaler - 2puff every 3-4 hours as needed for shortness of breath\wheezing\recurrent cough - nicotine ( ) (3 month supply) - 1 patch to shoulder, alternate sites daily.  May use benadryl if minor rash develops.  - Continue with Advair - ECHO (2D) prior to follow up   Tobacco use disorder Tobacco Cessation - Counseling regarding benefits of smoking cessation strategies was provided for more than 12 min. - Educated that at this time smoking- cessation represents the single most important step that patient can take to enhance the length and quality of live. - Educated patient regarding alternatives of behavior interventions, pharmacotherapy including NRT and non-nicotine therapy such, and combinations of both. - Patient at this time: requested nicotine patches.  Plan: nicotine ( ) (3 month supply) - 1 patch to shoulder, alternate sites daily.   May use benadryl if minor rash develops.      Updated Medication List Outpatient Encounter Prescriptions as of 01/05/2015  Medication Sig  . acetaminophen (TYLENOL) 500 MG tablet Take 1,000 mg by mouth every 6 (six) hours as needed. For pain  . albuterol (PROVENTIL HFA;VENTOLIN HFA) 108 (90 BASE) MCG/ACT inhaler Inhale 1-2 puffs into the lungs every 6 (six) hours as needed for wheezing or shortness of breath.  . ALPRAZolam (XANAX) 0.5 MG tablet Take 0.5 mg by mouth 3 (three) times daily as needed for anxiety.  Marland Kitchen amLODipine (NORVASC) 10 MG tablet Take 10 mg by mouth daily.  . diphenhydrAMINE (BENADRYL) 25 MG tablet Take 25 mg by mouth every 6 (six) hours as needed (for cough).  . Fluticasone-Salmeterol (ADVAIR) 250-50 MCG/DOSE AEPB Inhale 1 puff into the lungs 2 (two) times daily.  Marland Kitchen ibuprofen (ADVIL,MOTRIN) 800 MG tablet Take 1 tablet (800 mg total) by mouth 3 (three) times daily with meals.  . [DISCONTINUED] azithromycin (ZITHROMAX) 250 MG tablet Take 1 tablet (250 mg total) by mouth daily. Take 1 tablet daily starting tomorrow until finished  . [DISCONTINUED] benzonatate (TESSALON) 100 MG capsule Take 1 capsule (100 mg total) by mouth every 8 (eight) hours.  . [DISCONTINUED] HYDROcodone-acetaminophen (NORCO/VICODIN) 5-325 MG per tablet Take 1 tablet by mouth every 4 (four) hours as needed for moderate pain or severe pain.  . [DISCONTINUED] predniSONE (DELTASONE) 50 MG tablet Take 1 tablet (50 mg total) by mouth daily.    Orders for this visit: Orders Placed This Encounter  Procedures  . 2D Echocardiogram without contrast    Standing Status: Future     Number of Occurrences:      Standing Expiration Date: 01/05/2016    Scheduling Instructions:     [facility]    Order Specific Question:  Type of Echo    Answer:  Complete    Order Specific Question:  Where should this test be performed    Answer:  Other    Order Specific Question:  Reason for exam-Echo    Answer:  Mitral Valve  Disorder  424.0 / I05.9  . Pulmonary function test    Standing Status: Future     Number of Occurrences:      Standing Expiration Date: 01/05/2016    Order Specific Question:  Where should this test be performed?    Answer:  [facility]  Order Specific Question:  Full PFT: includes the following: basic spirometry, spirometry pre & post bronchodilator, diffusion capacity (DLCO), lung volumes    Answer:  Full PFT    Order Specific Question:  MIP/MEP    Answer:  No    Order Specific Question:  6 minute walk    Answer:  Yes    Order Specific Question:  ABG    Answer:  No    Order Specific Question:  Diffusion capacity (DLCO)    Answer:  No    Order Specific Question:  Lung volumes    Answer:  No    Order Specific Question:  Methacholine challenge    Answer:  No     Thank  you for the consultation and for allowing [facility] to assist in the care of your patient. Our recommendations are noted above.  Please contact us if we can be of further service.   Stephanie Acre, MD [facility] Office Number: [phone]

## 2015-01-05 NOTE — Patient Instructions (Signed)
Follow up with Dr. Dema SeverinMungal in 3-4 weeks - pulmonary function testing and 6 minute walk test prior to follow up - albuterol inhaler - 2puff every 3-4 hours as needed for shortness of breath\wheezing\recurrent cough - nicotine (14mcg) (3 month supply) - 1 patch to shoulder, alternate sites daily.  May use benadryl if minor rash develops.  - we will order an ECHO (2D) prior to follow up.   Cough Suppresion Take delsym two tsp every 12 hours and supplement if needed with  tramadol 50 mg up to 2 every 4 hours to suppress the urge to cough. Swallowing water or using ice chips/non mint and menthol containing candies (such as lifesavers or sugarless jolly ranchers) are also effective.  You should rest your voice and avoid activities that you know make you cough.  Once you have eliminated the cough for 3 straight days try reducing the tramadol first,  then the delsym as tolerated.

## 2015-01-07 ENCOUNTER — Emergency Department
Admit: 2015-01-07 | Disposition: A | Discharge status: Home / Self Care | Payer: Self-pay | Admitting: Emergency Medicine

## 2015-01-07 LAB — COMPREHENSIVE METABOLIC PANEL
ALK PHOS: 68 U/L
ANION GAP: 10 (ref 7–16)
Albumin: 4.4 g/dL
BILIRUBIN TOTAL: 0.4 mg/dL
BUN: 10 mg/dL
CREATININE: 0.65 mg/dL
Calcium, Total: 9.5 mg/dL
Chloride: 107 mmol/L
Co2: 23 mmol/L
EGFR (African American): >60
EGFR (Non-African Amer.): >60
GLUCOSE: 100 mg/dL — AB
POTASSIUM: 3.5 mmol/L
SGOT(AST): 24 U/L
SGPT (ALT): 21 U/L
SODIUM: 140 mmol/L
Total Protein: 8.3 g/dL — ABNORMAL HIGH

## 2015-01-07 LAB — CBC
HCT: 37.3 % (ref 35.0–47.0)
HGB: 11.8 g/dL — AB (ref 12.0–16.0)
MCH: 25.3 pg — ABNORMAL LOW (ref 26.0–34.0)
MCHC: 31.7 g/dL — AB (ref 32.0–36.0)
MCV: 80 fL (ref 80–100)
Platelet: 325 10*3/uL (ref 150–440)
RBC: 4.67 10*6/uL (ref 3.80–5.20)
RDW: 17.9 % — ABNORMAL HIGH (ref 11.5–14.5)
WBC: 6.3 10*3/uL (ref 3.6–11.0)

## 2015-01-07 LAB — TROPONIN I

## 2015-01-07 LAB — PRO B NATRIURETIC PEPTIDE: B-TYPE NATIURETIC PEPTID: 17 pg/mL

## 2015-01-12 ENCOUNTER — Other Ambulatory Visit: Payer: Self-pay | Admitting: Radiology

## 2015-01-12 DIAGNOSIS — I340 Nonrheumatic mitral (valve) insufficiency: Secondary | ICD-10-CM

## 2015-01-13 NOTE — Assessment & Plan Note (Signed)
Postinfectious cough secondary to recent pneumonia (right middle lobe, community acquired).  Plan: Cough Suppresion Take delsym two tsp every 12 hours and supplement if needed with  tramadol 50 mg up to 2 every 4 hours to suppress the urge to cough. Swallowing water or using ice chips/non mint and menthol containing candies (such as lifesavers or sugarless jolly ranchers) are also effective.  You should rest your voice and avoid activities that you know make you cough.  Once you have eliminated the cough for 3 straight days try reducing the tramadol first,  then the delsym as tolerated.

## 2015-01-13 NOTE — Assessment & Plan Note (Addendum)
Multifactorial: Recent hospitalization, recent pneumonia, deconditioning, tobacco abuse, obstructive versus restrictive disease.  I believe the dyspnea secondary to mostly deconditioning and recent pneumonia, however given that she had does have some orthopnea and pedal edema will further evaluate with 2-D echo.   Plan: - pulmonary function testing and 6 minute walk test prior to follow up - albuterol inhaler - 2puff every 3-4 hours as needed for shortness of breath\wheezing\recurrent cough - nicotine ( ) (3 month supply) - 1 patch to shoulder, alternate sites daily.  May use benadryl if minor rash develops.  - Continue with Advair - ECHO (2D) prior to follow up

## 2015-01-13 NOTE — Assessment & Plan Note (Signed)
Tobacco Cessation - Counseling regarding benefits of smoking cessation strategies was provided for more than 12 min. - Educated that at this time smoking- cessation represents the single most important step that patient can take to enhance the length and quality of live. - Educated patient regarding alternatives of behavior interventions, pharmacotherapy including NRT and non-nicotine therapy such, and combinations of both. - Patient at this time: requested nicotine patches.  Plan: nicotine ( ) (3 month supply) - 1 patch to shoulder, alternate sites daily.  May use benadryl if minor rash develops.

## 2015-01-13 NOTE — Assessment & Plan Note (Deleted)
Postinfectious cough secondary to recent pneumonia (right middle lobe, community acquired).  Plan: Cough Suppresion Take delsym two tsp every 12 hours and supplement if needed with  tramadol 50 mg up to 2 every 4 hours to suppress the urge to cough. Swallowing water or using ice chips/non mint and menthol containing candies (such as lifesavers or sugarless jolly ranchers) are also effective.  You should rest your voice and avoid activities that you know make you cough.  Once you have eliminated the cough for 3 straight days try reducing the tramadol first,  then the delsym as tolerated. 

## 2015-01-13 NOTE — Assessment & Plan Note (Addendum)
Patient with treated community acquired pneumonia. Treated with inpatient Rocephin, Levaquin, and had a bronchoscopy with BAL. CT scan showed right middle lobe pneumonia with possible collapse secondary to mucous plugging, atelectasis.  BAL cultures Fungal- Penicillium species - sent to the state lab on 12/22/2014, awaiting final results Micro- appears to be normal flora at 48 hours, final results 12/09/2014  Patient currently in the resolving phase of the recent pneumonia. Will follow up on final results of fungal cultures, hopefully these will be ready at her next visit. She does have a post infectious cough which will be treated with Delsym and tramadol.  Plan: -Continue with incentive spirometry, exercise, avoid tobacco use.

## 2015-01-16 ENCOUNTER — Other Ambulatory Visit: Payer: Self-pay | Admitting: *Deleted

## 2015-01-16 DIAGNOSIS — R059 Cough, unspecified: Secondary | ICD-10-CM

## 2015-01-16 DIAGNOSIS — I340 Nonrheumatic mitral (valve) insufficiency: Secondary | ICD-10-CM

## 2015-01-16 DIAGNOSIS — I341 Nonrheumatic mitral (valve) prolapse: Secondary | ICD-10-CM

## 2015-01-16 DIAGNOSIS — R0600 Dyspnea, unspecified: Secondary | ICD-10-CM

## 2015-01-16 DIAGNOSIS — R05 Cough: Secondary | ICD-10-CM

## 2015-01-19 ENCOUNTER — Other Ambulatory Visit: Payer: Self-pay | Admitting: Radiology

## 2015-01-19 DIAGNOSIS — I059 Rheumatic mitral valve disease, unspecified: Secondary | ICD-10-CM

## 2015-01-21 ENCOUNTER — Encounter: Payer: Self-pay | Admitting: Internal Medicine

## 2015-01-22 ENCOUNTER — Other Ambulatory Visit: Payer: Medicaid Other

## 2015-01-22 ENCOUNTER — Encounter: Payer: Self-pay | Admitting: *Deleted

## 2015-01-22 DIAGNOSIS — R0689 Other abnormalities of breathing: Secondary | ICD-10-CM

## 2015-02-08 NOTE — H&P (Signed)
PATIENT NAME:  Christy Mayer, [name] MR#:  [id number] DATE OF BIRTH:  [birth year]  DATE OF ADMISSION:  12/06/2014  REFERRING PHYSICIAN:  Enedina Finnerandolph N. Manson PasseyBrown, MD    PRIMARY CARE PHYSICIAN: [facility]    CHIEF COMPLAINT:  Shortness of breath.    HISTORY OF PRESENT ILLNESS: A 49 year old Caucasian female without significant past medical history presenting with shortness of breath. She describes 1 week in total duration of shortness of breath mainly dyspnea on exertion associated with cough productive of greenish-yellow sputum. Denies any fevers, chills. States the symptoms originally started with URI and sinus-like symptoms with nasal congestion as well as discharge that progressively worsened to shortness of breath. She went to urgent care 5 days ago, started on Levaquin, diagnosed with pneumonia. Despite taking her course of antibiotic she had no improvement and now has associated wheeze thus presented to the hospital for further work-up and evaluation given continuation of symptoms.   REVIEW OF SYSTEMS:   CONSTITUTIONAL: Denies fevers, chills, fatigue, weakness.  EYES: Denies blurred vision, double vision, or eye pain.  EARS, NOSE, THROAT: Denies tinnitus, ear pain, hearing loss.  RESPIRATORY:  Positive for cough, wheeze, shortness of breath as described above.    CARDIOVASCULAR: Denies chest pain, palpitations, edema.  GASTROINTESTINAL: Denies nausea, vomiting, diarrhea, abdominal pain.  GENITOURINARY: Denies any dysuria or hematuria.  ENDOCRINE: Denies nocturia or thyroid problems. HEMATOLOGIC AND LYMPHATIC:  Denies easy bruising or bleeding.  SKIN: Denies rash or lesion.  MUSCULOSKELETAL: Denies pain in neck, back, shoulder, knees, hips or arthritic symptoms.  NEUROLOGIC: Denies paralysis, paresthesias.  PSYCHIATRIC: Denies anxiety or depressive symptoms.  Otherwise full review of systems performed by me is negative.    PAST MEDICAL HISTORY: No significant medical history.   SOCIAL  HISTORY: Positive for everyday tobacco use as well as occasional alcohol use. No drug use.   FAMILY HISTORY: Denies any known cardiovascular or pulmonary disorders.   ALLERGIES: No known drug allergies.   HOME MEDICATIONS: None, however, she was just started on ProAir 90 mcg inhalation 2 puffs 4 times daily as needed for shortness of breath as well as Levaquin 750 mg p.o. daily. These were both started 4 to 5 days ago.   PHYSICAL EXAMINATION:  VITAL SIGNS: Temperature 98.2, heart rate 108, respirations 20, blood pressure 182/88, saturating 94% on room air. Weight 90.7 kg, BMI of 31.4.  GENERAL: Well-developed, well-nourished Caucasian female currently in no acute distress.  HEAD: Normocephalic, atraumatic.  EYES: Pupils equal, round, reactive to light. Extraocular muscles intact. No scleral icterus.  MOUTH: Moist mucosal membrane. Dentition intact. No abscess noted.  EAR, NOSE, THROAT: Clear without exudates. No external lesions.  NECK: Supple. No thyromegaly. No nodules. No JVD.  PULMONARY: Scant wheezing heard throughout the right middle and lower, tachypneic without use of accessory muscles with good respiratory effort.   CHEST: Nontender to palpation.  CARDIOVASCULAR: S1, S2, regular rate and rhythm. No murmurs, rubs or gallops. No edema. Pedal pulses 2+ bilaterally. GASTROINTESTINAL: Soft, nontender, nondistended. No masses. Positive bowel sounds. No hepatosplenomegaly.  MUSCULOSKELETAL: No swelling, clubbing, or edema. Range of motion full in all extremities.  NEUROLOGIC: Cranial nerves II through XII intact. No gross focal neurological deficits. Sensation intact. Reflexes intact.  SKIN: No ulceration, lesions, rash, cyanosis. Skin is warm and dry. Turgor intact.  PSYCHIATRIC: Mood and affect within normal limits. The patient is awake, alert and oriented x 3. Insight and judgment intact.   LABORATORY DATA: Sodium 143, potassium 3.6, chloride 110, bicarbonate 22, BUN  17, creatinine  0.71, glucose 115. WBC 6.9, hemoglobin 11.4, platelets of 255. Chest x-ray performed revealing right middle lobe pneumonia.   ASSESSMENT AND PLAN: A 49 year old female without significant history presenting with shortness of breath. She was recently diagnosed with pneumonia, started on Levaquin, has been taking this for about 5 days in total with no improvement.  1.  Community-acquired pneumonia, failure of outpatient treatment. Provide supplemental O2 to keep SaO2 greater than 92%. DuoNeb treatments q. 4 hours as needed for shortness of breath. We will add steroids for her wheezing and likely has component of reactive airway disease. Cover with antibiotics. She has received vancomycin and Zosyn in the Emergency Department. We can downgrade this to ceftriaxone and azithromycin. I will also add Advair.  2.  venous thromboembolic prophylaxis  with  heparin subcutaneously.   CODE STATUS: The patient is a full code.   TIME SPENT: 35 minutes.    ____________________________ Cletis Athens. Hower, MD dkh:AT D: 12/06/2014 03:54:15 ET T: 12/06/2014 04:29:15 ET JOB#: [id number]  cc: Cletis Athens. Hower, MD, <Dictator> DAVID Synetta Shadow MD ELECTRONICALLY SIGNED 12/06/2014 20:48

## 2015-02-08 NOTE — Discharge Summary (Signed)
PATIENT NAME:  Christy Mayer, Christy Mayer MR#:  [id number] DATE OF BIRTH:  03-May-1966  DATE OF ADMISSION:  12/07/2014 DATE OF DISCHARGE:  12/10/2014  ADMITTING PHYSICIAN: Cletis Athens. Hower, MD   DISCHARGING PHYSICIAN: Enid Baas, MD    PRIMARY CARE PHYSICIAN: Nonlocal.   CONSULTATIONS IN THE HOSPITAL: Pulmonary critical care consultation with Dr. Belia Heman.   DISCHARGE DIAGNOSES:  1.  Multilobar pneumonia.  2.  Anxiety.  3.  Chronic obstructive pulmonary disease, new diagnosis.   DISCHARGE MEDICATIONS:  1.  Aleve 220 mg q. 8 hours p.r.n. for pain.  2.  ProAir inhaler 2 puffs 4 times a day as needed.  3.  Prednisone taper for 3 more days.  4.  Percocet 10/325 mg 1 tablet q. 6 hours p.r.n. for severe pain.  5.  Xanax 0.5 mg q. 8 hours p.r.n. for anxiety.  6.  Robitussin AC 10 mL q. 4 hours p.r.n. for cough.  7.  Spiriva HandiHaler 1 capsule inhalation daily.  8.  Advair 250/50 one puff b.i.d.  9.  Norvasc 2.5 mg p.o. daily.  10.  Augmentin 800 mg tablet 1 tablet p.o. b.i.d. for 6 more days.   DISCHARGE DIET: Regular diet.   DISCHARGE ACTIVITY: As tolerated.    FOLLOWUP INSTRUCTIONS: Follow up with Dr. Dema Severin in 1 week.   LABORATORY DATA AND IMAGING STUDIES PRIOR TO DISCHARGE:  1.  WBC 8.8, hemoglobin 10.8, hematocrit 34.6, platelet count 255,000.  2.  Sodium 143, potassium 4.1, chloride is 110, bicarbonate 25, BUN is 16, creatinine 0.77, glucose of 108, and calcium of 9.1.  3.  Blood cultures on admission are negative.  4.  Bronchial wash cultures for Gram stain growing gram-positive rods; the final sensitivities pending. Negative for any yeast or fungus.  5.  INR 0.9.  6.  CT of the chest with contrast showing right middle lobe collapse suspicious for right middle lobe pneumonia.    BRIEF HOSPITAL COURSE: Christy Mayer is a 49 year old Caucasian female with no significant past medical history other than anxiety and smoking, presents to the hospital secondary to worsening cough and  difficulty breathing and failing outpatient treatment for pneumonia.  1.  Multilobar pneumonia: The patient was treated with Levaquin based on her symptoms as an outpatient for 1 week; however, symptoms did not improve. She continued to have dyspnea and worsening cough. She was admitted as an observation initially, started on Rocephin and azithromycin. Because her symptoms were not improving, she had a CT of her chest, which showed right middle lobe pneumonia. She was seen by pulmonologist, Dr. Belia Heman, who felt that she could have underlying COPD, so started her on inhalers and prednisone taper as well. The patient had a bronchoscopy, which actually showed not just right-sided but bilateral pneumonia and significant purulent secretions coming from the right lobe. Most of the secretions were suctioned out, washed, and sent for cultures. At this time, the culture is growing only gram-positive rods; however, the patient has been on Rocephin and azithromycin and also Levaquin as an outpatient. She is being discharged on Augmentin at this time and will have pulmonary follow her up as an outpatient.  2.  Hypertension: Low-dose amlodipine has been added for her blood pressure in the hospital.    3.  Anxiety: Started on Xanax as needed.  4.  COPD: She is on prednisone taper and inhalers, as mentioned above.   DISCHARGE CONDITION: Stable.   DISCHARGE DISPOSITION: Home.   TIME SPENT ON DISCHARGE: 40 minutes.    ____________________________  Enid Baasadhika Amarisa Wilinski, MD rk:bm D: 12/10/2014 16:06:55 ET T: 12/11/2014 03:47:01 ET JOB#: [id number]  cc: Enid Baasadhika Tiyonna Sardinha, MD, <Dictator> Stephanie AcreVishal Mungal, MD Enid BaasADHIKA Yoskar Murrillo MD ELECTRONICALLY SIGNED 12/25/2014 18:02

## 2015-02-12 ENCOUNTER — Other Ambulatory Visit: Payer: Self-pay | Admitting: *Deleted

## 2015-02-12 DIAGNOSIS — R0600 Dyspnea, unspecified: Secondary | ICD-10-CM

## 2015-02-12 DIAGNOSIS — I059 Rheumatic mitral valve disease, unspecified: Secondary | ICD-10-CM

## 2015-02-24 ENCOUNTER — Ambulatory Visit: Payer: Medicaid Other

## 2015-02-24 ENCOUNTER — Encounter: Payer: Self-pay | Admitting: *Deleted

## 2015-02-24 ENCOUNTER — Ambulatory Visit: Payer: Medicaid Other | Admitting: Internal Medicine

## 2015-03-05 ENCOUNTER — Encounter: Payer: Self-pay | Admitting: *Deleted

## 2015-03-05 ENCOUNTER — Other Ambulatory Visit: Payer: Medicaid Other

## 2015-08-17 ENCOUNTER — Encounter: Payer: Self-pay | Admitting: Emergency Medicine

## 2015-08-17 ENCOUNTER — Ambulatory Visit: Payer: Medicaid Other

## 2015-08-17 ENCOUNTER — Ambulatory Visit
Admission: EM | Admit: 2015-08-17 | Discharge: 2015-08-17 | Disposition: A | Discharge status: Home / Self Care | Payer: Medicaid Other | Attending: Emergency Medicine | Admitting: Emergency Medicine

## 2015-08-17 DIAGNOSIS — I341 Nonrheumatic mitral (valve) prolapse: Secondary | ICD-10-CM | POA: Insufficient documentation

## 2015-08-17 DIAGNOSIS — F17210 Nicotine dependence, cigarettes, uncomplicated: Secondary | ICD-10-CM | POA: Diagnosis not present

## 2015-08-17 DIAGNOSIS — J441 Chronic obstructive pulmonary disease with (acute) exacerbation: Secondary | ICD-10-CM | POA: Diagnosis not present

## 2015-08-17 DIAGNOSIS — J40 Bronchitis, not specified as acute or chronic: Secondary | ICD-10-CM

## 2015-08-17 DIAGNOSIS — R05 Cough: Secondary | ICD-10-CM | POA: Diagnosis present

## 2015-08-17 DIAGNOSIS — R0602 Shortness of breath: Secondary | ICD-10-CM | POA: Diagnosis present

## 2015-08-17 MED ORDER — ALBUTEROL SULFATE HFA 108 (90 BASE) MCG/ACT IN AERS: 2.0000 | INHALATION_SPRAY | RESPIRATORY_TRACT | Status: DC | PRN

## 2015-08-17 MED ORDER — GUAIFENESIN-CODEINE 100-10 MG/5ML PO SYRP: 5.0000 mL | ORAL_SOLUTION | Freq: Four times a day (QID) | ORAL | Status: DC | PRN

## 2015-08-17 MED ORDER — PREDNISONE 20 MG PO TABS: 40.0000 mg | ORAL_TABLET | Freq: Every day | ORAL | Status: DC

## 2015-08-17 MED ORDER — IPRATROPIUM-ALBUTEROL 0.5-2.5 (3) MG/3ML IN SOLN
3.0000 mL | Freq: Once | RESPIRATORY_TRACT | Status: AC
  Administered 2015-08-17: 3 mL via RESPIRATORY_TRACT

## 2015-08-17 NOTE — Discharge Instructions (Signed)
Follow-up with Christy Mayer. Go to the ER if you get worse or are not responding to the medications or if you start running a fever

## 2015-08-17 NOTE — ED Notes (Signed)
Cough and feel bad

## 2015-08-17 NOTE — ED Provider Notes (Signed)
HPI  SUBJECTIVE:  Christy Mayer is a 49 y.o. female who presents with nonproductive cough, shortness of breath, dyspnea on exertion to 100 feet, achy chest soreness for the past 3 weeks. She reports occasional wheezing. Symptoms are worse with lying down, better with inhaler and with sitting up. States she has to sleep on 4-5 pillows, which is different for her. She has tried her husband's inhaler. She has not tried anything else. No fevers, other chest pain, lower extremity edema, weight gain, nocturia, PND, abdominal pain, sinus pain/pressure, ear pain, sore throat, postnasal drip. States this all started off as  "sinuses" with nasal congestion and postnasal drip. This is now resolved. Past medical history of frequent pneumonias, admitted last year required bronchoscopy. Also history of emphysema, mitral valve prolapse. She continues to smoke. No history of congestive heart failure, arrhythmia, diabetes, hypertension.    Past Medical History  Diagnosis Date  . Mitral valve prolapse   . Heart palpitations   . Arthritis   . Back pain   . Pneumonia   . Miscarriage     Past Surgical History  Procedure Laterality Date  . Cesarean section  11/2007    Family History  Problem Relation Age of Onset  . Adopted: Yes    Social History  Substance Use Topics  . Smoking status: Current Every Day Smoker -- 0.50 packs/day for 25 years    Types: Cigarettes  . Smokeless tobacco: Never Used  . Alcohol Use: No    No current facility-administered medications for this encounter.  Current outpatient prescriptions:  .  acetaminophen (TYLENOL) 500 MG tablet, Take 1,000 mg by mouth every 6 (six) hours as needed. For pain, Disp: , Rfl:  .  albuterol (PROVENTIL HFA;VENTOLIN HFA) 108 (90 BASE) MCG/ACT inhaler, Inhale 2 puffs into the lungs every 4 (four) hours as needed for wheezing or shortness of breath., Disp: 1 Inhaler, Rfl: 1 .  ALPRAZolam (XANAX) 0.5 MG tablet, Take 0.5 mg by mouth 3 (three)  times daily as needed for anxiety., Disp: , Rfl:  .  amLODipine (NORVASC) 10 MG tablet, Take 10 mg by mouth daily., Disp: , Rfl:  .  diphenhydrAMINE (BENADRYL) 25 MG tablet, Take 25 mg by mouth every 6 (six) hours as needed (for cough)., Disp: , Rfl:  .  Fluticasone-Salmeterol (ADVAIR) 250-50 MCG/DOSE AEPB, Inhale 1 puff into the lungs 2 (two) times daily., Disp: 60 each, Rfl: 2 .  guaiFENesin-codeine (CHERATUSSIN AC) 100-10 MG/5ML syrup, Take 5 mLs by mouth 4 (four) times daily as needed for cough or congestion., Disp: 120 mL, Rfl: 0 .  ibuprofen (ADVIL,MOTRIN) 800 MG tablet, Take 1 tablet (800 mg total) by mouth 3 (three) times daily with meals., Disp: 21 tablet, Rfl: 0 .  nicotine (NICODERM CQ - DOSED IN MG/24 HOURS) 14 mg/24hr patch, Place 1 patch (14 mg total) onto the skin daily., Disp: 28 patch, Rfl: 1 .  predniSONE (DELTASONE) 20 MG tablet, Take 2 tablets (40 mg total) by mouth daily with breakfast. X 5 days, Disp: 10 tablet, Rfl: 0 .  traMADol (ULTRAM) 50 MG tablet, Take 1 tablet (50 mg total) by mouth every 6 (six) hours as needed., Disp: 20 tablet, Rfl: 0  No Known Allergies   ROS  As noted in HPI.   Physical Exam  BP 125/63 mmHg  Pulse 77  Temp(Src) 98.2 F (36.8 C) (Tympanic)  Resp 18  Ht 5' 8.5" (1.74 m)  Wt 213 lb (96.616 kg)  BMI 31.91 kg/m2  SpO2  99%  LMP 04/12/2014  Constitutional: Well developed, well nourished, no acute distress Eyes: PERRL, EOMI, conjunctiva normal bilaterally HENT: Normocephalic, atraumatic,mucus membranes moist Neck: No JVD Respiratory: Clear to auscultation bilaterally, no rales, no wheezing, no rhonchi Cardiovascular: Normal rate and rhythm, no murmurs, no gallops, no rubs GI: nondistended, skin: No rash, skin intact Musculoskeletal: No edema, no tenderness, no deformities Neurologic: Alert & oriented x 3, CN II-XII grossly intact, no motor deficits, sensation grossly intact Psychiatric: Speech and behavior appropriate   ED  Course   Medications  ipratropium-albuterol (DUONEB) 0.5-2.5 (3) MG/3ML nebulizer solution 3 mL (3 mLs Nebulization Given 08/17/15 1520)    Orders Placed This Encounter  Procedures  . DG Chest 2 View    Standing Status: Standing     Number of Occurrences: 1     Standing Expiration Date:     Order Specific Question:  Reason for Exam (SYMPTOM  OR DIAGNOSIS REQUIRED)    Answer:  cough 3 wk h/o copd, PNA r/o PNA   No results found for this or any previous visit (from the past 24 hour(s)). Dg Chest 2 View  08/17/2015  CLINICAL DATA:  Nonproductive cough for 3 weeks. Shortness of breath. Upper chest pressure. History of left lung collapse. EXAM: CHEST  2 VIEW COMPARISON:  None. FINDINGS: The heart size and mediastinal contours are within normal limits. Both lungs are clear. The visualized skeletal structures are unremarkable. IMPRESSION: No active cardiopulmonary disease. Electronically Signed   By: Charlett Nose M.D.   On: 08/17/2015 14:57    ED Clinical Impression  Bronchitis  COPD exacerbation [facility])   ED Assessment/Plan   lungs clear, but patient only has fair air movement. We'll give DuoNeb as patient states the inhaler at home is helped. No evidence of CHF. Doing chest x-ray to rule out pneumonia as patient gets frequent pneumonias..  Reviewed imaging independently. Normal chest x-ray. See radiology report for full details.  On reevaluation, patient states she feels much better after DuoNeb. Repeat examination, lungs still clear, improved air movement no wheezing, rales, rhonchi. Feel that this is a bronchitis/emphysema COPD exacerbation. She has multiple comorbidities most specifically admission for pneumonia in the previous year, so we'll send home with azithromycin in addition to regular bronchodilators, prednisone 40 mg po for 5 days. Cough syrup as needed. Patient to follow up with [facility]. Discussed  imaging, MDM, plan and followup with patient .Patient agrees with  plan.  *This clinic note was created using Dragon dictation software. Therefore, there may be occasional mistakes despite careful proofreading.  ?  Domenick Gong, MD 08/17/15 1600

## 2015-08-25 ENCOUNTER — Emergency Department
Admission: EM | Admit: 2015-08-25 | Discharge: 2015-08-25 | Disposition: A | Discharge status: Home / Self Care | Payer: Medicaid Other | Attending: Student | Admitting: Student

## 2015-08-25 ENCOUNTER — Encounter: Payer: Self-pay | Admitting: Emergency Medicine

## 2015-08-25 ENCOUNTER — Emergency Department: Payer: Medicaid Other

## 2015-08-25 DIAGNOSIS — Z79899 Other long term (current) drug therapy: Secondary | ICD-10-CM | POA: Insufficient documentation

## 2015-08-25 DIAGNOSIS — J441 Chronic obstructive pulmonary disease with (acute) exacerbation: Secondary | ICD-10-CM | POA: Insufficient documentation

## 2015-08-25 DIAGNOSIS — R059 Cough, unspecified: Secondary | ICD-10-CM

## 2015-08-25 DIAGNOSIS — R51 Headache: Secondary | ICD-10-CM | POA: Diagnosis not present

## 2015-08-25 DIAGNOSIS — R0600 Dyspnea, unspecified: Secondary | ICD-10-CM

## 2015-08-25 DIAGNOSIS — Z7951 Long term (current) use of inhaled steroids: Secondary | ICD-10-CM | POA: Insufficient documentation

## 2015-08-25 DIAGNOSIS — Z3202 Encounter for pregnancy test, result negative: Secondary | ICD-10-CM | POA: Diagnosis not present

## 2015-08-25 DIAGNOSIS — R42 Dizziness and giddiness: Secondary | ICD-10-CM | POA: Insufficient documentation

## 2015-08-25 DIAGNOSIS — R079 Chest pain, unspecified: Secondary | ICD-10-CM | POA: Insufficient documentation

## 2015-08-25 DIAGNOSIS — R200 Anesthesia of skin: Secondary | ICD-10-CM | POA: Diagnosis not present

## 2015-08-25 DIAGNOSIS — R05 Cough: Secondary | ICD-10-CM

## 2015-08-25 DIAGNOSIS — F17210 Nicotine dependence, cigarettes, uncomplicated: Secondary | ICD-10-CM | POA: Insufficient documentation

## 2015-08-25 DIAGNOSIS — Z7952 Long term (current) use of systemic steroids: Secondary | ICD-10-CM | POA: Insufficient documentation

## 2015-08-25 HISTORY — DX: Chronic obstructive pulmonary disease, unspecified: J44.9

## 2015-08-25 HISTORY — DX: Unspecified asthma, uncomplicated: J45.909

## 2015-08-25 HISTORY — DX: Emphysema, unspecified: J43.9

## 2015-08-25 LAB — BASIC METABOLIC PANEL
Anion gap: 8 (ref 5–15)
BUN: 18 mg/dL (ref 6–20)
CALCIUM: 9.5 mg/dL (ref 8.9–10.3)
CHLORIDE: 106 mmol/L (ref 101–111)
CO2: 26 mmol/L (ref 22–32)
CREATININE: 0.71 mg/dL (ref 0.44–1.00)
GFR calc non Af Amer: >60 mL/min (ref >60)
Glucose, Bld: 128 mg/dL — ABNORMAL HIGH (ref 65–99)
Potassium: 3.6 mmol/L (ref 3.5–5.1)
Sodium: 140 mmol/L (ref 135–145)

## 2015-08-25 LAB — TROPONIN I
Troponin I: <0.03 ng/mL (ref <0.031)
Troponin I: <0.03 ng/mL (ref <0.031)

## 2015-08-25 LAB — CBC
HEMATOCRIT: 38.6 % (ref 35.0–47.0)
Hemoglobin: 12.6 g/dL (ref 12.0–16.0)
MCH: 29.1 pg (ref 26.0–34.0)
MCHC: 32.6 g/dL (ref 32.0–36.0)
MCV: 89.5 fL (ref 80.0–100.0)
PLATELETS: 250 10*3/uL (ref 150–440)
RBC: 4.31 MIL/uL (ref 3.80–5.20)
RDW: 17.7 % — AB (ref 11.5–14.5)
WBC: 12 10*3/uL — AB (ref 3.6–11.0)

## 2015-08-25 LAB — HCG, QUANTITATIVE, PREGNANCY: hCG, Beta Chain, Quant, S: <1 m[IU]/mL (ref <5)

## 2015-08-25 MED ORDER — HYDROCODONE-ACETAMINOPHEN 5-325 MG PO TABS
2.0000 | ORAL_TABLET | Freq: Once | ORAL | Status: AC
  Administered 2015-08-25: 2 via ORAL
  Filled 2015-08-25: qty 2

## 2015-08-25 MED ORDER — IPRATROPIUM-ALBUTEROL 0.5-2.5 (3) MG/3ML IN SOLN
3.0000 mL | Freq: Once | RESPIRATORY_TRACT | Status: AC
  Administered 2015-08-25: 3 mL via RESPIRATORY_TRACT
  Filled 2015-08-25: qty 3

## 2015-08-25 MED ORDER — LORAZEPAM 2 MG/ML IJ SOLN
1.0000 mg | Freq: Once | INTRAMUSCULAR | Status: AC
  Administered 2015-08-25: 1 mg via INTRAVENOUS

## 2015-08-25 MED ORDER — IOHEXOL 350 MG/ML SOLN
100.0000 mL | Freq: Once | INTRAVENOUS | Status: AC | PRN
  Administered 2015-08-25: 100 mL via INTRAVENOUS

## 2015-08-25 MED ORDER — SODIUM CHLORIDE 0.9 % IV BOLUS (SEPSIS)
500.0000 mL | Freq: Once | INTRAVENOUS | Status: AC
  Administered 2015-08-25: 500 mL via INTRAVENOUS

## 2015-08-25 MED ORDER — ASPIRIN 81 MG PO CHEW
324.0000 mg | CHEWABLE_TABLET | Freq: Once | ORAL | Status: AC
  Administered 2015-08-25: 324 mg via ORAL
  Filled 2015-08-25: qty 4

## 2015-08-25 MED ORDER — LORAZEPAM 2 MG/ML IJ SOLN
INTRAMUSCULAR | Status: AC
  Administered 2015-08-25: 1 mg via INTRAVENOUS
  Filled 2015-08-25: qty 1

## 2015-08-25 NOTE — ED Notes (Signed)
Pt to ed with c/o cough x 4 weeks,  Was seen at urgent care last week, dx with bronchitis and emphysema.  Reports about 1 week ago started with chest pain, numbness in bilat arms, burning sensation in neck and massive headache.  Pt reports sob, also reports dizziness.  Pt states increased activity causes increased sob.

## 2015-08-25 NOTE — ED Provider Notes (Signed)
[facility] Emergency Department Provider Note  ____________________________________________  Time seen: Approximately 11:10 AM  I have reviewed the triage vital signs and the nursing notes.   HISTORY  Chief Complaint Cough; Chest Pain; and Dizziness    HPI Christy Mayer is a 49 y.o. female with history of COPD, valve prolapse, frequent pneumonia who presents for evaluation of nearly 1 month of cough and shortness of breath, gradual onset, constant/worsening since onset, currently moderate. Symptoms are worse when she lies flat and when she exerts herself. One week ago she developed bandlike chest pain that has been constant since onset and is associated with intermittent numbness/tingling in bilateral hands. She reports the chest pain feels like a burning sensation radiating up into her left neck. She is also complaining of intermittent occipital headache which is worse with cough, improves with Tylenol and Motrin. She was seen at urgent care and 08/17/2015 and her symptoms were thought to be related to COPD. She felt better after he was in treatment and was discharged on prednisone. She reports she completed the prednisone course but she is feeling "shaky". No history of coronary artery disease. She does have a history of prior DVT but is not chronically anticoagulated.   Past Medical History  Diagnosis Date  . Mitral valve prolapse   . Heart palpitations   . Arthritis   . Back pain   . Pneumonia   . Miscarriage   . Asthma   . COPD (chronic obstructive pulmonary disease) (HCC)   . Emphysema lung [facility])     Patient Active Problem List   Diagnosis Date Noted  . Tobacco use disorder 01/05/2015  . Cough 01/05/2015  . Pneumonia, organism unspecified 01/05/2015  . Dyspnea 01/05/2015    Past Surgical History  Procedure Laterality Date  . Cesarean section  11/2007    Current Outpatient Rx  Name  Route  Sig  Dispense  Refill  . acetaminophen (TYLENOL) 500  MG tablet   Oral   Take 1,000 mg by mouth every 6 (six) hours as needed. For pain         . albuterol (PROVENTIL HFA;VENTOLIN HFA) 108 (90 BASE) MCG/ACT inhaler   Inhalation   Inhale 2 puffs into the lungs every 4 (four) hours as needed for wheezing or shortness of breath.   1 Inhaler   1   . ALPRAZolam (XANAX) 0.5 MG tablet   Oral   Take 0.5 mg by mouth 3 (three) times daily as needed for anxiety.         Marland Kitchen amLODipine (NORVASC) 10 MG tablet   Oral   Take 10 mg by mouth daily.         . diphenhydrAMINE (BENADRYL) 25 MG tablet   Oral   Take 25 mg by mouth every 6 (six) hours as needed (for cough).         . Fluticasone-Salmeterol (ADVAIR) 250-50 MCG/DOSE AEPB   Inhalation   Inhale 1 puff into the lungs 2 (two) times daily.   60 each   2   . guaiFENesin-codeine (CHERATUSSIN AC) 100-10 MG/5ML syrup   Oral   Take 5 mLs by mouth 4 (four) times daily as needed for cough or congestion.   120 mL   0   . ibuprofen (ADVIL,MOTRIN) 800 MG tablet   Oral   Take 1 tablet (800 mg total) by mouth 3 (three) times daily with meals.   21 tablet   0   . nicotine (NICODERM CQ -  DOSED IN MG/24 HOURS) 14 mg/24hr patch   Transdermal   Place 1 patch (14 mg total) onto the skin daily.   28 patch   1   . predniSONE (DELTASONE) 20 MG tablet   Oral   Take 2 tablets (40 mg total) by mouth daily with breakfast. X 5 days   10 tablet   0   . traMADol (ULTRAM) 50 MG tablet   Oral   Take 1 tablet (50 mg total) by mouth every 6 (six) hours as needed.   20 tablet   0     Allergies Review of patient's allergies indicates no known allergies.  Family History  Problem Relation Age of Onset  . Adopted: Yes    Social History Social History  Substance Use Topics  . Smoking status: Current Every Day Smoker -- 0.50 packs/day for 25 years    Types: Cigarettes  . Smokeless tobacco: Never Used  . Alcohol Use: 0.0 oz/week    0 Standard drinks or equivalent per week    Review of  Systems Constitutional: No fever/chills Eyes: No visual changes. ENT: No sore throat. Cardiovascular: + chest pain. Respiratory: + shortness of breath. Gastrointestinal: No abdominal pain.  No nausea, no vomiting.  No diarrhea.  No constipation. Genitourinary: Negative for dysuria. Musculoskeletal: Negative for back pain. Skin: Negative for rash. Neurological: Positive for headaches, no focal weakness, + intermittent numbness of bilateral hands.  10-point ROS otherwise negative.  ____________________________________________   PHYSICAL EXAM:  VITAL SIGNS: ED Triage Vitals  Enc Vitals Group     BP 08/25/15 0947 148/95 mmHg     Pulse Rate 08/25/15 0947 101     Resp 08/25/15 0947 20     Temp 08/25/15 0947 98 F (36.7 C)     Temp Source 08/25/15 0947 Oral     SpO2 08/25/15 0947 98 %     Weight 08/25/15 0947 213 lb (96.616 kg)     Height 08/25/15 0947  (1.702 m)     Head Cir --      Peak Flow --      Pain Score 08/25/15 0948 10     Pain Loc --      Pain Edu? --      Excl. in GC? --     Constitutional: Alert and oriented. Well appearing and in no acute distress. + frequent dry cough Eyes: Conjunctivae are normal. PERRL. EOMI. Head: Atraumatic. Nose: No congestion/rhinnorhea. Mouth/Throat: Mucous membranes are moist.  Oropharynx non-erythematous. Neck: No stridor.   Cardiovascular: Normal rate, regular rhythm. Grossly normal heart sounds.  Good peripheral circulation. Respiratory: Normal respiratory effort.  No retractions. Lungs CTAB. Gastrointestinal: Soft and nontender. No distention.  No CVA tenderness. Genitourinary: deferred Musculoskeletal: No lower extremity tenderness nor edema.  No joint effusions. Neurologic:  Normal speech and language. No gross focal neurologic deficits are appreciated. 5 out of 5 strength in bilateral upper and lower stomach, sensation intact to light touch throughout. Cranial nerves II through XII intact. Skin:  Skin is warm, dry and  intact. No rash noted. Psychiatric: Mood and affect are normal. Speech and behavior are normal.  ____________________________________________   LABS (all labs ordered are listed, but only abnormal results are displayed)  Labs Reviewed  BASIC METABOLIC PANEL - Abnormal; Notable for the following:    Glucose, Bld 128 (*)    All other components within normal limits  CBC - Abnormal; Notable for the following:    WBC 12.0 (*)    RDW  17.7 (*)    All other components within normal limits  TROPONIN I  HCG, QUANTITATIVE, PREGNANCY  TROPONIN I   ____________________________________________  EKG  ED ECG REPORT I, Gayla Doss, the attending physician, personally viewed and interpreted this ECG.   Date: 08/25/2015  EKG Time: 09:49  Rate: 99  Rhythm: normal EKG, normal sinus rhythm  Axis: normal  Intervals:none  ST&T Change: No acute ST elevation  ____________________________________________  RADIOLOGY  CXR  IMPRESSION: No acute abnormalities.   CT angio chest IMPRESSION: 1. No pulmonary embolism. 2. No acute consolidative airspace disease. Mild bibasilar scarring versus atelectasis. 3. Small hiatal hernia. ____________________________________________   PROCEDURES  Procedure(s) performed: None  Critical Care performed: No  ____________________________________________   INITIAL IMPRESSION / ASSESSMENT AND PLAN / ED COURSE  Pertinent labs & imaging results that were available during my care of the patient were reviewed by me and considered in my medical decision making (see chart for details).  Christy Mayer is a 49 y.o. female with history of COPD, valve prolapse, frequent pneumonia who presents for evaluation of nearly 1 month of cough and shortness of breath, gradual onset, constant/worsening since onset, currently moderate. On exam, she is very well-appearing and in no acute distress. Mildly tachycardic on arrival however this has resolved without any  intervention. Her exam is benign. No increased work of breathing, no hypoxia. Lungs are clear to auscultation bilaterally. She is an intact neurological examination without deficit, including intact sensation throughout, equal grip strength bilaterally. EKG is reassuring, I doubt her chest pain is cardiac in nature given that it is lasted at least 1 week and is constant. We'll obtain troponin. Chest x-ray shows no acute abnormality. Headache is moderate, not maximal at onset, she has no neck pain or neck stiffness and I doubt that headache is secondary to subarachnoid hemorrhage or meningitis. I suspect it is more related to her recurrent cough. The fact that she feels "shakey" or jittery may be related to prednisone use. Given her shortness of breath, chest pain, history of DVT, we'll obtain CT angiochest to evaluate for PE. This will also evaluate for evidence of acute aortic pathology although suspect this is less likely and this very well-appearing patient. Exam is not consistent with acute CVA and she does not have many risk factors for CVA/TIA and abcd2 score is 3, low risk. We'll give IV fluids, treat her pain, give DuoNeb treatments as these seem to make her feel better.  ----------------------------------------- 3:21 PM on 08/25/2015 ----------------------------------------- Patient reports that she feels better at this time. She is sitting up in bed, texting on her phone. Vital signs are stable, she is afebrile. CT angiochest shows no PE, no consolidative airspace disease. 2 troponin negative, not consistent with ACS.  CBC shows minimal leukocytosis. Normal BMP. Patient is not pregnant. I discussed with her that I'm concerned that her symptoms may be 2 to worsening COPD. She reports to me "I saw that pulmonology Dr. a while back but I never went back because I don't want any part of that COPD stuff". I discussed with her that I think it could be beneficial for her to follow up with pulmonology soon  and she voices understanding of this. Additionally I have called Dr. America Brown office and schedled appointment to follow up with him on 11/18 for additional outpatient testing given her complaint of chest pain.  ____________________________________________   FINAL CLINICAL IMPRESSION(S) / ED DIAGNOSES  Final diagnoses:  Cough  Chest pain, unspecified chest  pain type  Dyspnea      Gayla DossEryka A Harlyn Italiano, MD 08/25/15 [phone]

## 2015-09-08 ENCOUNTER — Ambulatory Visit
Admission: EM | Admit: 2015-09-08 | Discharge: 2015-09-08 | Disposition: A | Discharge status: Home / Self Care | Payer: Medicaid Other | Attending: Family Medicine | Admitting: Family Medicine

## 2015-09-08 ENCOUNTER — Encounter: Payer: Self-pay | Admitting: Emergency Medicine

## 2015-09-08 ENCOUNTER — Ambulatory Visit: Payer: Medicaid Other

## 2015-09-08 DIAGNOSIS — F17210 Nicotine dependence, cigarettes, uncomplicated: Secondary | ICD-10-CM | POA: Diagnosis not present

## 2015-09-08 DIAGNOSIS — J45909 Unspecified asthma, uncomplicated: Secondary | ICD-10-CM | POA: Diagnosis not present

## 2015-09-08 DIAGNOSIS — I341 Nonrheumatic mitral (valve) prolapse: Secondary | ICD-10-CM | POA: Diagnosis not present

## 2015-09-08 DIAGNOSIS — M62838 Other muscle spasm: Secondary | ICD-10-CM | POA: Diagnosis not present

## 2015-09-08 DIAGNOSIS — M6248 Contracture of muscle, other site: Secondary | ICD-10-CM | POA: Diagnosis not present

## 2015-09-08 DIAGNOSIS — J449 Chronic obstructive pulmonary disease, unspecified: Secondary | ICD-10-CM | POA: Insufficient documentation

## 2015-09-08 DIAGNOSIS — M25512 Pain in left shoulder: Secondary | ICD-10-CM | POA: Diagnosis not present

## 2015-09-08 DIAGNOSIS — M542 Cervicalgia: Secondary | ICD-10-CM | POA: Diagnosis present

## 2015-09-08 MED ORDER — TIZANIDINE HCL 4 MG PO CAPS: 4.0000 mg | ORAL_CAPSULE | Freq: Three times a day (TID) | ORAL | Status: DC

## 2015-09-08 MED ORDER — KETOROLAC TROMETHAMINE 60 MG/2ML IM SOLN
60.0000 mg | Freq: Once | INTRAMUSCULAR | Status: AC
  Administered 2015-09-08: 60 mg via INTRAMUSCULAR

## 2015-09-08 NOTE — ED Notes (Signed)
Patient states that she tripped over her dog 2 nights ago.  Patient fell on her left side at home.  Patient c/o ongoing HA and neck pain and left arm.

## 2015-09-08 NOTE — Discharge Instructions (Signed)
As we discussed please use medications while another adult is available in the house until you see how much they relax your muscles  Please be careful around the dog !  You had advised me that you must leave the department for a personal responsibility - Please report to the ER of your choice if you fail  to have steady improvement of your headache Over the next few hours  If you develop and visual changes, nausea /vomiting , increased pain or other concerns please  Report directly to ER   Zanaflex 4 mg 1 at bedtime, may repeat during day every 8 hours if needed-- do not drive or use equipment Ibuprofen 200 mg 3 tablets three times a day with meals for 3 days as needed Then reduce  to 2 tablets  Once or twice daily alternating with tylenol  Muscle Cramps and Spasms Muscle cramps and spasms occur when a muscle or muscles tighten and you have no control over this tightening (involuntary muscle contraction). They are a common problem and can develop in any muscle. The most common place is in the calf muscles of the leg. Both muscle cramps and muscle spasms are involuntary muscle contractions, but they also have differences:   Muscle cramps are sporadic and painful. They may last a few seconds to a quarter of an hour. Muscle cramps are often more forceful and last longer than muscle spasms.  Muscle spasms may or may not be painful. They may also last just a few seconds or much longer. CAUSES  It is uncommon for cramps or spasms to be due to a serious underlying problem. In many cases, the cause of cramps or spasms is unknown. Some common causes are:   Overexertion.   Overuse from repetitive motions (doing the same thing over and over).   Remaining in a certain position for a long period of time.   Improper preparation, form, or technique while performing a sport or activity.   Dehydration.   Injury.   Side effects of some medicines.   Abnormally low levels of the salts and  ions in your blood (electrolytes), especially potassium and calcium. This could happen if you are taking water pills (diuretics) or you are pregnant.  Some underlying medical problems can make it more likely to develop cramps or spasms. These include, but are not limited to:   Diabetes.   Parkinson disease.   Hormone disorders, such as thyroid problems.   Alcohol abuse.   Diseases specific to muscles, joints, and bones.   Blood vessel disease where not enough blood is getting to the muscles.  HOME CARE INSTRUCTIONS   Stay well hydrated. Drink enough water and fluids to keep your urine clear or pale yellow.  It may be helpful to massage, stretch, and relax the affected muscle.  For tight or tense muscles, use a warm towel, heating pad, or hot shower water directed to the affected area.  If you are sore or have pain after a cramp or spasm, applying ice to the affected area may relieve discomfort.  Put ice in a plastic bag.  Place a towel between your skin and the bag.  Leave the ice on for 15-20 minutes, 03-04 times a day.  Medicines used to treat a known cause of cramps or spasms may help reduce their frequency or severity. Only take over-the-counter or prescription medicines as directed by your caregiver. SEEK MEDICAL CARE IF:  Your cramps or spasms get more severe, more frequent, or do not  improve over time.  MAKE SURE YOU:   Understand these instructions.  Will watch your condition.  Will get help right away if you are not doing well or get worse.   This information is not intended to replace advice given to you by your health care provider. Make sure you discuss any questions you have with your health care provider.   Document Released: 03/18/2002 Document Revised: 01/21/2013 Document Reviewed: 09/12/2012 Elsevier Interactive Patient Education [employer]

## 2015-09-09 ENCOUNTER — Encounter: Payer: Self-pay | Admitting: Physician Assistant
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
## 2015-09-14 ENCOUNTER — Emergency Department: Payer: Medicaid Other

## 2015-09-14 ENCOUNTER — Emergency Department
Admission: EM | Admit: 2015-09-14 | Discharge: 2015-09-14 | Disposition: A | Discharge status: Home / Self Care | Payer: Medicaid Other | Attending: Emergency Medicine | Admitting: Emergency Medicine

## 2015-09-14 ENCOUNTER — Encounter: Payer: Self-pay | Admitting: Emergency Medicine

## 2015-09-14 DIAGNOSIS — Z7952 Long term (current) use of systemic steroids: Secondary | ICD-10-CM | POA: Insufficient documentation

## 2015-09-14 DIAGNOSIS — Z7951 Long term (current) use of inhaled steroids: Secondary | ICD-10-CM | POA: Diagnosis not present

## 2015-09-14 DIAGNOSIS — F17210 Nicotine dependence, cigarettes, uncomplicated: Secondary | ICD-10-CM | POA: Diagnosis not present

## 2015-09-14 DIAGNOSIS — Z79899 Other long term (current) drug therapy: Secondary | ICD-10-CM | POA: Diagnosis not present

## 2015-09-14 DIAGNOSIS — G44209 Tension-type headache, unspecified, not intractable: Secondary | ICD-10-CM | POA: Insufficient documentation

## 2015-09-14 DIAGNOSIS — R51 Headache: Secondary | ICD-10-CM | POA: Diagnosis present

## 2015-09-14 MED ORDER — ONDANSETRON HCL 4 MG/2ML IJ SOLN
4.0000 mg | Freq: Once | INTRAMUSCULAR | Status: AC
  Administered 2015-09-14: 4 mg via INTRAVENOUS
  Filled 2015-09-14: qty 2

## 2015-09-14 MED ORDER — BUTALBITAL-APAP-CAFFEINE 50-325-40 MG PO TABS: 1.0000 | ORAL_TABLET | Freq: Four times a day (QID) | ORAL | Status: DC | PRN

## 2015-09-14 MED ORDER — METOCLOPRAMIDE HCL 5 MG/ML IJ SOLN
20.0000 mg | Freq: Once | INTRAVENOUS | Status: AC
  Administered 2015-09-14: 20 mg via INTRAVENOUS
  Filled 2015-09-14: qty 4

## 2015-09-14 MED ORDER — ORPHENADRINE CITRATE 30 MG/ML IJ SOLN
30.0000 mg | Freq: Once | INTRAMUSCULAR | Status: DC
  Filled 2015-09-14: qty 2

## 2015-09-14 MED ORDER — METHOCARBAMOL 1000 MG/10ML IJ SOLN
1000.0000 mg | Freq: Once | INTRAMUSCULAR | Status: AC
  Administered 2015-09-14: 1000 mg via INTRAMUSCULAR
  Filled 2015-09-14: qty 10

## 2015-09-14 MED ORDER — SODIUM CHLORIDE 0.9 % IV BOLUS (SEPSIS)
1000.0000 mL | Freq: Once | INTRAVENOUS | Status: AC
  Administered 2015-09-14: 1000 mL via INTRAVENOUS

## 2015-09-14 MED ORDER — KETOROLAC TROMETHAMINE 30 MG/ML IJ SOLN
30.0000 mg | Freq: Once | INTRAMUSCULAR | Status: AC
  Administered 2015-09-14: 30 mg via INTRAVENOUS
  Filled 2015-09-14: qty 1

## 2015-09-14 NOTE — ED Notes (Signed)
States started with vomiting today, no fever or chills, headache on left side of head

## 2015-09-14 NOTE — ED Notes (Signed)
Headache x 2 weeks, denies sinus drainage, L sided, no head injury, nausea and photophobia.

## 2015-09-14 NOTE — Discharge Instructions (Signed)
Tension Headache A tension headache is pain, pressure, or aching that is felt over the front and sides of your head. These headaches can last from 30 minutes to several days. HOME CARE Managing Pain  Take over-the-counter and prescription medicines only as told by your doctor.  Lie down in a dark, quiet room when you have a headache.  If directed, apply ice to your head and neck area:  Put ice in a plastic bag.  Place a towel between your skin and the bag.  Leave the ice on for 20 minutes, 2-3 times per day.  Use a heating pad or a hot shower to apply heat to your head and neck area as told by your doctor. Eating and Drinking  Eat meals on a regular schedule.  Do not drink a lot of alcohol.  Do not use a lot of caffeine, or stop using caffeine. General Instructions  Keep all follow-up visits as told by your doctor. This is important.  Keep a journal to find out if certain things bring on headaches. For example, write down:  What you eat and drink.  How much sleep you get.  Any change to your diet or medicines.  Try getting a massage, or doing other things that help you to relax.  Lessen stress.  Sit up straight. Do not tighten (tense) your muscles.  Do not use tobacco products. This includes cigarettes, chewing tobacco, or e-cigarettes. If you need help quitting, ask your doctor.  Exercise regularly as told by your doctor.  Get enough sleep. This may mean 7-9 hours of sleep. GET HELP IF:  Your symptoms are not helped by medicine.  You have a headache that feels different from your usual headache.  You feel sick to your stomach (nauseous) or you throw up (vomit).  You have a fever. GET HELP RIGHT AWAY IF:  Your headache becomes very bad.  You keep throwing up.  You have a stiff neck.  You have trouble seeing.  You have trouble speaking.  You have pain in your eye or ear.  Your muscles are weak or you lose muscle control.  You lose your balance  or you have trouble walking.  You feel like you will pass out (faint) or you pass out.  You have confusion.   This information is not intended to replace advice given to you by your health care provider. Make sure you discuss any questions you have with your health care provider.   Document Released: 12/21/2009 Document Revised: 06/17/2015 Document Reviewed: 01/19/2015 Elsevier Interactive Patient Education 2016 [employer]  

## 2015-09-14 NOTE — ED Provider Notes (Signed)
[facility] Emergency Department Provider Note  ____________________________________________  Time seen: Approximately 5:03 PM  I have reviewed the triage vital signs and the nursing notes.   HISTORY  Chief Complaint Headache    HPI Christy Mayer is a 49 y.o. female patient complaining of 2 weeks of headache. Patient state the headache starts at the back of her neck and radiate up to the cervical area of her head. Patient denies any fever or chills associated with this headache. He states he noticed onset photophobia and nausea in the past 2 days.Patient taken Tylenol and ibuprofen without resolution. Patient rating the headache as a 9/10 and described the pain as sharp.   Past Medical History  Diagnosis Date  . Mitral valve prolapse   . Heart palpitations   . Arthritis   . Back pain   . Pneumonia   . Miscarriage   . Asthma   . COPD (chronic obstructive pulmonary disease) (HCC)   . Emphysema lung [facility](HCC)     Patient Active Problem List   Diagnosis Date Noted  . Tobacco use disorder 01/05/2015  . Cough 01/05/2015  . Pneumonia, organism unspecified 01/05/2015  . Dyspnea 01/05/2015    Past Surgical History  Procedure Laterality Date  . Cesarean section  11/2007    Current Outpatient Rx  Name  Route  Sig  Dispense  Refill  . acetaminophen (TYLENOL) 500 MG tablet   Oral   Take 1,000 mg by mouth every 6 (six) hours as needed. For pain         . albuterol (PROVENTIL HFA;VENTOLIN HFA) 108 (90 BASE) MCG/ACT inhaler   Inhalation   Inhale 2 puffs into the lungs every 4 (four) hours as needed for wheezing or shortness of breath.   1 Inhaler   1   . ALPRAZolam (XANAX) 0.5 MG tablet   Oral   Take 0.5 mg by mouth 3 (three) times daily as needed for anxiety.         Marland Kitchen. amLODipine (NORVASC) 10 MG tablet   Oral   Take 10 mg by mouth daily.         . butalbital-acetaminophen-caffeine (FIORICET) 50-325-40 MG tablet   Oral   Take 1-2 tablets  by mouth every 6 (six) hours as needed for headache.   20 tablet   0   . diphenhydrAMINE (BENADRYL) 25 MG tablet   Oral   Take 25 mg by mouth every 6 (six) hours as needed (for cough).         . Fluticasone-Salmeterol (ADVAIR) 250-50 MCG/DOSE AEPB   Inhalation   Inhale 1 puff into the lungs 2 (two) times daily.   60 each   2   . guaiFENesin-codeine (CHERATUSSIN AC) 100-10 MG/5ML syrup   Oral   Take 5 mLs by mouth 4 (four) times daily as needed for cough or congestion.   120 mL   0   . ibuprofen (ADVIL,MOTRIN) 800 MG tablet   Oral   Take 1 tablet (800 mg total) by mouth 3 (three) times daily with meals.   21 tablet   0   . nicotine (NICODERM CQ - DOSED IN MG/24 HOURS) 14 mg/24hr patch   Transdermal   Place 1 patch (14 mg total) onto the skin daily.   28 patch   1   . predniSONE (DELTASONE) 20 MG tablet   Oral   Take 2 tablets (40 mg total) by mouth daily with breakfast. X 5 days   10 tablet  0   . tiZANidine (ZANAFLEX) 4 MG capsule   Oral   Take 1 capsule (4 mg total) by mouth 3 (three) times daily.   15 capsule   0   . traMADol (ULTRAM) 50 MG tablet   Oral   Take 1 tablet (50 mg total) by mouth every 6 (six) hours as needed.   20 tablet   0     Allergies Review of patient's allergies indicates no known allergies.  Family History  Problem Relation Age of Onset  . Adopted: Yes    Social History Social History  Substance Use Topics  . Smoking status: Current Every Day Smoker -- 0.50 packs/day for 25 years    Types: Cigarettes  . Smokeless tobacco: Never Used  . Alcohol Use: 0.0 oz/week    0 Standard drinks or equivalent per week    Review of Systems Constitutional: No fever/chills Eyes: Photophobia. ENT: No sore throat. Cardiovascular: Denies chest pain. Respiratory: Denies shortness of breath. Gastrointestinal: No abdominal pain.  Nausea, no vomiting.  No diarrhea.  No constipation. Genitourinary: Negative for dysuria. Musculoskeletal:  Negative for back pain. Skin: Negative for rash. Neurological: Positive for occipital headache without, focal weakness or numbness. 10-point ROS otherwise negative.  ____________________________________________   PHYSICAL EXAM:  VITAL SIGNS: ED Triage Vitals  Enc Vitals Group     BP 09/14/15 1532 154/85 mmHg     Pulse Rate 09/14/15 1532 90     Resp 09/14/15 1532 18     Temp 09/14/15 1532 98.6 F (37 C)     Temp Source 09/14/15 1532 Oral     SpO2 09/14/15 1532 98 %     Weight 09/14/15 1532 210 lb (95.255 kg)     Height 09/14/15 1532  (1.702 m)     Head Cir --      Peak Flow --      Pain Score 09/14/15 1532 9     Pain Loc --      Pain Edu? --      Excl. in GC? --     Constitutional: Alert and oriented. Well appearing and in no acute distress. Eyes: Conjunctivae are normal. PERRL. EOMI. mild photophobia Head: Atraumatic. Nose: No congestion/rhinnorhea. Mouth/Throat: Mucous membranes are moist.  Oropharynx non-erythematous. Neck: No stridor. No cervical spine tenderness to palpation. Hematological/Lymphatic/Immunilogical: No cervical lymphadenopathy. Cardiovascular: Normal rate, regular rhythm. Grossly normal heart sounds.  Good peripheral circulation. Respiratory: Normal respiratory effort.  No retractions. Lungs CTAB. Gastrointestinal: Soft and nontender. No distention. No abdominal bruits. No CVA tenderness. Musculoskeletal: No lower extremity tenderness nor edema.  No joint effusions. Neurologic:  Normal speech and language. No gross focal neurologic deficits are appreciated. No gait instability. Skin:  Skin is warm, dry and intact. No rash noted. Psychiatric: Mood and affect are normal. Speech and behavior are normal.  ____________________________________________   LABS (all labs ordered are listed, but only abnormal results are displayed)  Labs Reviewed - No data to  display ____________________________________________  EKG   ____________________________________________  RADIOLOGY  CT scan head is unremarkable. ____________________________________________   PROCEDURES  Procedure(s) performed: None  Critical Care performed: No  ____________________________________________   INITIAL IMPRESSION / ASSESSMENT AND PLAN / ED COURSE  Pertinent labs & imaging results that were available during my care of the patient were reviewed by me and considered in my medical decision making (see chart for details).  Tension headache. Discussed nail CT results with patient. Patient given Toradol, Zofran, and Reglan IV. Patient will be discharged  prescription for Esgic and advised to follow-up with her family doctor. ____________________________________________   FINAL CLINICAL IMPRESSION(S) / ED DIAGNOSES  Final diagnoses:  Tension-type headache, not intractable, unspecified chronicity pattern      Joni Reining, PA-C 09/14/15 1857  Sharman Cheek, MD 09/15/15 0009

## 2015-09-16 LAB — HM HIV SCREENING LAB: HM HIV SCREENING: NEGATIVE

## 2015-09-17 DIAGNOSIS — F411 Generalized anxiety disorder: Secondary | ICD-10-CM | POA: Insufficient documentation

## 2015-09-17 DIAGNOSIS — IMO0001 Reserved for inherently not codable concepts without codable children: Secondary | ICD-10-CM | POA: Insufficient documentation

## 2016-06-03 ENCOUNTER — Encounter: Payer: Self-pay | Admitting: *Deleted

## 2016-06-10 ENCOUNTER — Ambulatory Visit: Payer: Self-pay | Admitting: Family Medicine

## 2016-06-30 ENCOUNTER — Other Ambulatory Visit: Payer: Self-pay | Admitting: Family Medicine

## 2016-06-30 ENCOUNTER — Encounter: Payer: Self-pay | Admitting: Family Medicine

## 2016-06-30 ENCOUNTER — Ambulatory Visit (INDEPENDENT_AMBULATORY_CARE_PROVIDER_SITE_OTHER): Payer: Medicaid Other | Admitting: Family Medicine

## 2016-06-30 VITALS — BP 120/86 | HR 78 | Temp 98.8°F | Resp 16 | Ht 67.0 in | Wt 244.8 lb

## 2016-06-30 DIAGNOSIS — F339 Major depressive disorder, recurrent, unspecified: Secondary | ICD-10-CM | POA: Insufficient documentation

## 2016-06-30 DIAGNOSIS — F32A Depression, unspecified: Secondary | ICD-10-CM

## 2016-06-30 DIAGNOSIS — G8929 Other chronic pain: Secondary | ICD-10-CM

## 2016-06-30 DIAGNOSIS — F411 Generalized anxiety disorder: Secondary | ICD-10-CM

## 2016-06-30 DIAGNOSIS — F17200 Nicotine dependence, unspecified, uncomplicated: Secondary | ICD-10-CM

## 2016-06-30 DIAGNOSIS — E669 Obesity, unspecified: Secondary | ICD-10-CM | POA: Diagnosis not present

## 2016-06-30 DIAGNOSIS — Z23 Encounter for immunization: Secondary | ICD-10-CM

## 2016-06-30 DIAGNOSIS — F329 Major depressive disorder, single episode, unspecified: Secondary | ICD-10-CM | POA: Diagnosis not present

## 2016-06-30 DIAGNOSIS — J432 Centrilobular emphysema: Secondary | ICD-10-CM

## 2016-06-30 DIAGNOSIS — M5442 Lumbago with sciatica, left side: Secondary | ICD-10-CM

## 2016-06-30 DIAGNOSIS — J449 Chronic obstructive pulmonary disease, unspecified: Secondary | ICD-10-CM

## 2016-06-30 MED ORDER — ESCITALOPRAM OXALATE 20 MG PO TABS: ORAL_TABLET | ORAL | 0 refills | Status: DC

## 2016-06-30 MED ORDER — GABAPENTIN 100 MG PO CAPS: 100.0000 mg | ORAL_CAPSULE | Freq: Three times a day (TID) | ORAL | 0 refills | Status: DC

## 2016-06-30 NOTE — Assessment & Plan Note (Signed)
Stable, chronic problem. Still active smoker, contemplating reducing. No current treatment - intolerance to Advair and self discontinued Spiriva No longer followed by Pulm - Follow-up as needed, encouraged smoking cessation planning today, will likely need rx Albuterol PRN and re-discuss maintenance therapy at follow-up

## 2016-06-30 NOTE — Assessment & Plan Note (Signed)
Active smoker, chronic problem. Only temporary quit attempts in past (pregnancy or other health reasons) Prior failed on NRT patches 14mg , still had nicotine withdrawal symptoms and self discontinued - Contemplating reducing, interested in quitting in future, once anxiety is improved - Goal now to reduce to 1/4 to 1/2ppd max - Smoking cessation counseling provided today - Follow-up 4 weeks, future consider adjunct treatment with Wellbutrin for Depression and Smoking cessation if needed
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
## 2016-06-30 NOTE — Patient Instructions (Addendum)
Thank you for coming in to clinic today.  1. For your Anxiety / Depression - Start Escitalopram (generic lexapro) - these are 20mg  tabs, cut in half for 10mg  daily for first 1 to 2 weeks, then if you feel like you need the higher dose, if symptoms not optimally controlled you may take 1 whole tab daily for 20mg  daily until I see you back. This is the max dose of this medication, we do not use any higher dose, but it is the standard dose as well. - Potential side effects are related to headaches, insomnia, it can make you drowsy, and some GI intolerance with nausea, and diarrhea. These usually improve over time while taking it, and are often only temporary - Full effect is after 4-6 weeks of taking this, strongly encourage to at least try the full 1 month to see how you are doing  2. For Pain, I think you have a lot of nerve pain, related to your back and hip / pelvis - You may choose to start the Escitalopram FIRST for 1-2 weeks then start this, it does not have many side effects, mostly some Sedation. - Start Gabapentin 100mg  capsules, take at night for 2-3 nights only, and then increase to 2 times a day for a few days, and then may increase to 3 times a day, it may make you drowsy, if helps significantly at night only, then you can increase instead to 3 capsules at night, instead of 3 times a day  - Reduce Aleve to 220mg  x 2 tablets TWICE a day with food - (you can take temporarily 2 tabs 3 times a day for only 1 more week max)  Consider one of the following Therapists, check to see if you are covered or what your cost is, we can talk more in the future, but these are SELF referrals only. So you dont need an order from me  1. [facility]   Address: [address], [place], [place] [address] Hours: Open today  9AM-7PM Phone: [phone]  2. Anell [facility]yl [facility] [facility] Address: [address], [place], [place] [address] Phone: [phone]  [phone]  Also try to cut back on smoking as discussed, pick a goal and stick with it for now, if you are on less than half pack per day quitting will be much easier. We will discuss future medications.  Please schedule a follow-up appointment with Dr. Althea CharonKaramalegos in 4 weeks to follow-up Anxiety / Depression Meds / Pain, then next follow-up for Annual Physical in 2-3 months (you should arrive FASTING in morning, no food or drink after midnight before for labs)  If you have any other questions or concerns, please feel free to call the clinic or send a message through MyChart. You may also schedule an earlier appointment if necessary.  Saralyn PilarAlexander Dameshia Seybold, DO [facility]

## 2016-06-30 NOTE — Assessment & Plan Note (Signed)
Chronic L LBP with associated L sciatica. 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 - Concern with radicular symptoms on Left, and some mild hip weakness likely due to pain. No other low back red flags concerning for a cauda equina or other spinal emergency today - Overusing OTC NSAIDs, and not maximizing other conservative treatment  Plan: 1. Start Gabapentin 100mg  TID - titration instructions for slow increase, consider 300 at night instead, future will likely need higher dose. May help sleep as well. 2. Reduce dose of OTC Aleve to 440mg  BID WC, ideally will be able to use this only as PRN for 2-4 weeks at a time. DC all other NSAIDs. May take Tylenol for breakthrough. 3. Encouraged use of heating pad 1-2x daily for now then PRN 4. Future consider PRN muscle relaxant as well 5. Follow-up 4 weeks, anticipate will need Lumbar and Hip X-rays updated (last lumbar x-ray 2006, neg), likely will benefit from PT

## 2016-06-30 NOTE — Assessment & Plan Note (Signed)
Chronic problem, uncontrolled currently elevated PHQ9. Previous good response on Prozac 20 x 2 weeks, reassuring for future treatment. Did not follow-up with [facility] due to not wanting group therapy, but open to one-on-one counseling. - Denies suicidal or homicidal ideation. Concern with former history of overdose attempts (as teen and in 4220s)  Plan: 1. Start Escitalopram 20mg  daily - titrate with half tab 10mg  daily for 1-2 weeks then increase as tolerated to whole tab daily, anticipate to improve her anxiety which is worsening her depression 2. Given info for self referral to local therapist for counseling, follow-up consider future psych referral if needed 3. Follow-up 4 weeks, med monitoring, once anxiety improved will work on reducing smoking (suspect nicotine is always contributing to her anxiety)

## 2016-06-30 NOTE — Assessment & Plan Note (Addendum)
Chronic problem, uncontrolled currently elevated GAD7. Previous good response on Prozac 20 x 2 weeks, reassuring for future treatment. Did not follow-up with [facility] due to not wanting group therapy, but open to one-on-one counseling.  Plan: 1. Start Escitalopram 20mg  daily - titrate with half tab 10mg  daily for 1-2 weeks then increase as tolerated to whole tab daily, anticipate to improve her anxiety which is worsening her depression 2. Given info for self referral to local therapist for counseling, follow-up consider future psych referral if needed 3. Follow-up 4 weeks, med monitoring, once anxiety improved will work on reducing smoking (suspect nicotine is always contributing to her anxiety)

## 2016-06-30 NOTE — Assessment & Plan Note (Signed)
Recent weight gain. Encouraged lifestyle modifications, will discuss in more detail at follow-up

## 2016-07-11 ENCOUNTER — Telehealth: Payer: Self-pay | Admitting: Family Medicine

## 2016-07-11 DIAGNOSIS — G8929 Other chronic pain: Secondary | ICD-10-CM

## 2016-07-11 DIAGNOSIS — M5442 Lumbago with sciatica, left side: Principal | ICD-10-CM

## 2016-07-11 MED ORDER — GABAPENTIN 300 MG PO CAPS: 300.0000 mg | ORAL_CAPSULE | Freq: Three times a day (TID) | ORAL | 2 refills | Status: DC

## 2016-07-11 NOTE — Telephone Encounter (Signed)
Pt was prescribed 100 mg gabapentin at last visit.  She was told she could take up to 1000 mg daily and she is up to the 1000 so she [facility] [place] [facility] [place].  Her call back number is [phone]

## 2016-07-11 NOTE — Telephone Encounter (Signed)
Patient notified.[place] 

## 2016-07-11 NOTE — Telephone Encounter (Signed)
Last visit 06/30/16 to establish care, see note for details, briefly treated for chronic Left low back pain with left sided sciatica. She was never treated with Gabapentin previously, and started on a low dose titration 100mg  TID, in future we discussed may need to go up to 300mg  3 times a day (900mg  daily, not 1000mg ).  [facility] 300[facility]  capsules, instructions to take 1 capsule 3 times a day, to help with neuropathic pain. However, she should be advised to slowly go up on this dose as well.  Attempted to call patient twice today at given call back #, unable to reach her.  Please call patient to let her know that new higher dose Gabapentin refill sent in to her pharmacy today, and give her instructions.  - Start with one 300mg  capsule daily for 2-3 days then increase to one capsule TWICE a day for 3-5 days then if needed can do max dose for now of one capsule THREE times a day, same side effects of may make drowsy or sleepy, caution with driving or working.  Plan to follow-up as scheduled on 07/19/16 to discuss medications.  Saralyn PilarAlexander Jinan Biggins, DO [facility] 07/11/2016, 12:22 PM

## 2016-07-19 ENCOUNTER — Ambulatory Visit (INDEPENDENT_AMBULATORY_CARE_PROVIDER_SITE_OTHER): Payer: Medicaid Other | Admitting: Family Medicine

## 2016-07-19 ENCOUNTER — Encounter: Payer: Self-pay | Admitting: Family Medicine

## 2016-07-19 DIAGNOSIS — M5442 Lumbago with sciatica, left side: Secondary | ICD-10-CM

## 2016-07-19 DIAGNOSIS — G8929 Other chronic pain: Secondary | ICD-10-CM | POA: Diagnosis not present

## 2016-07-19 DIAGNOSIS — M150 Primary generalized (osteo)arthritis: Secondary | ICD-10-CM

## 2016-07-19 DIAGNOSIS — F331 Major depressive disorder, recurrent, moderate: Secondary | ICD-10-CM | POA: Diagnosis not present

## 2016-07-19 DIAGNOSIS — F411 Generalized anxiety disorder: Secondary | ICD-10-CM | POA: Diagnosis not present

## 2016-07-19 DIAGNOSIS — M159 Polyosteoarthritis, unspecified: Secondary | ICD-10-CM | POA: Insufficient documentation

## 2016-07-19 MED ORDER — NAPROXEN 500 MG PO TABS: 500.0000 mg | ORAL_TABLET | Freq: Two times a day (BID) | ORAL | 2 refills | Status: DC

## 2016-07-19 MED ORDER — ESCITALOPRAM OXALATE 20 MG PO TABS: 20.0000 mg | ORAL_TABLET | Freq: Every day | ORAL | 3 refills | Status: DC

## 2016-07-19 MED ORDER — GABAPENTIN 300 MG PO CAPS: 300.0000 mg | ORAL_CAPSULE | Freq: Four times a day (QID) | ORAL | 5 refills | Status: DC

## 2016-07-19 NOTE — Progress Notes (Signed)
Subjective:    Patient ID: Christy Mayer, female    DOB: 1966-03-05, 50 y.o.   MRN: [id number]  Christy Mayer is a 50 y.o. female presenting on 07/19/2016 for Anxiety (discuss medication)     Anxiety     GAD, Major Depression: - Last visit 06/30/16 for establishing care, discussed anxiety and depression in detail, see note for more background, started on Lexapro 10 then titrated up to 20mg  daily. - Today presents for follow-up after 3-4 weeks of treatment, she increased to 1 whole tab of Lexapro 20mg  daily after about 4 days, tolerating well without side effects or problems. Describes initial no improvement until 2.5 to 3 weeks of treatment, now some significant improvements, still has significant anxiety but she is able to remain calmer, sit and rest more, focus better (able to sit down and read and feel good about herself), also more energy to go out and plans to go to a holiday market with a friend. Less fidgeting and hyperactivity. Still describes general anxiety, variety of stressors but much more manageable at controlling these thoughts - Still associated insomnia with anxiety and pain (improved pain control, now less of an issue), some difficulty with awakening overnight, goes to bed around 11 and up at 5:15 - Has list of therapy references, has not scheduled yet, will consider in future  Chronic Pain, Left Low Back Pain with Sciatica / Left Hip: - Last visit 06/30/16, see background discussion of chronic back pain - Today reports significant improvement on Gabapentin, she has titrated dose up to 3000mg  daily, tolerated low doses well without any sedation, now taking gabapentin 300mg  capsules x 3 in AM, x 2 lunch, x 2 mid afternoon, and x 3 at bedtime, she denies significant side effects or sedation, grogginess. Night-time dose does help her sleep and controls night-time pain. Her nerve pain is mostly controlled by report now, with much less sciatica. Still describes chronic problem  with aching in low back and knees, other joints. Takes Aleve OTC now 2 tabs BID (down from 3 tabs TID), not taking Tylenol.  - Admits recent R knee injury when dog ran into it and had hyperextension - Function mostly normal she is able to ambulate without assistance - Denies any loss of control bladder/bowel incontinence or retention, unintentional wt loss, night sweats   Elevated BP: No recent problems with BP outside of office. Not checking regular. Prior known history of Pre-HTN. No regular treatment for anti-HTN. Still in some pain raising BP by report.   Social History  Substance Use Topics  . Smoking status: Current Every Day Smoker    Packs/day: 0.50    Years: 30.00    Types: Cigarettes  . Smokeless tobacco: Never Used  . Alcohol use 0.0 oz/week     Comment: occasionally       Review of Systems   Per HPI unless specifically indicated above  Depression screen [facility] 2/9 07/19/2016 06/30/2016  Decreased Interest 1 3  Down, Depressed, Hopeless 3 3  PHQ - 2 Score 4 6  Altered sleeping 3 3  Tired, decreased energy 1 3  Change in appetite 0 1  Feeling bad or failure about yourself  1 2  Trouble concentrating 1 3  Moving slowly or fidgety/restless 0 2  Suicidal thoughts 0 0  PHQ-9 Score 10 20  Difficult doing work/chores Somewhat difficult Somewhat difficult   GAD 7 : Generalized Anxiety Score 07/19/2016 06/30/2016  Nervous, Anxious, on Edge 2 3  Control/stop worrying  2 3  Worry too much - different things 2 3  Trouble relaxing 1 3  Restless 1 3  Easily annoyed or irritable 3 3  Afraid - awful might happen 1 2  Total GAD 7 Score 12 20  Anxiety Difficulty Not difficult at all Not difficult at all      Objective:    BP 135/75   Pulse 79   Temp 98.8 F (37.1 C) (Oral)   Resp 16   Ht 5\' 7"  (1.702 m)   Wt 245 lb (111.1 kg)   LMP 08/11/2014   BMI 38.37 kg/m   Wt Readings from Last 3 Encounters:  07/19/16 245 lb (111.1 kg)  06/30/16 244 lb 12.8 oz (111 kg)    09/14/15 210 lb (95.3 kg)    Physical Exam  Constitutional: She is oriented to person, place, and time. She appears well-developed and well-nourished. No distress.  Well-appearing, comfortable, cooperative  HENT:  Head: Normocephalic and atraumatic.  Cardiovascular: Normal rate and intact distal pulses.   Musculoskeletal: She exhibits no edema.  Low Back Stable from last visit. Mild to moderate improvement in reduced +TTP over left lower back. Mild persistent muscle spasm.  Neurological: She is alert and oriented to person, place, and time.  Skin: Skin is warm and dry. She is not diaphoretic.  Psychiatric: She has a normal mood and affect. Her behavior is normal. Thought content normal.  Well-groomed, good eye contact, normal speech and thought  Nursing note and vitals reviewed.       Assessment & Plan:   Problem List Items Addressed This Visit    Osteoarthritis, multiple sites    Suspected underlying etiology of some chronic pain in low back, knees, with DJD. - See Chronic Low Back Pain for plan - Continue NSAIDs, increase Tylenol, conservative measures      Relevant Medications   naproxen (NAPROSYN) 500 MG tablet   GAD (generalized anxiety disorder)    Significant improvement on Lexapro 20mg  daily, GAD7 score down from 20 to 12, not difficult function. - Improved sleep, still significant anxious symptoms but more manageable   Plan: 1. Continue Escitalopram 20mg  daily (90 day supply and refills), anticipate at least 12 mo treatment for now, anticipate continued improvement, only at 3-4 weeks now 2. Re-advised may establish self referral with Therapist in near future once improved, may need for PRN follow-up if any worsening 3. Follow-up 3 months physical - future consider additional treatments such as buspar if needed      Relevant Medications   escitalopram (LEXAPRO) 20 MG tablet   Depression    Significant improvement on Lexapro 20mg  daily, PHQ9 down from 20 to 10,  still somewhat difficult, overall more manageable.  Plan: 1. Continue Escitalopram 20mg  daily (90 day supply refills), anticipate will continue to improve, only 3-4 weeks currently 2. May establish with therapy as discussed 3. Follow-up 3 months physical      Relevant Medications   escitalopram (LEXAPRO) 20 MG tablet   Chronic left-sided low back pain with left-sided sciatica    Improved, mostly controlled sciatica and neuropathic pain on Gabapentin high dose 3000mg  daily, significantly reduced Aleve down 440mg  BID instead of prior 660 TID. No other OTC or conservative therapies.  Plan: 1. Refill Gabapentin high dose 300mg  caps QID (3-2-2-3 dosing), controls sciatica and helps sleep, no significant sedation. In future may need consider Lyrica for better control. 2. Sent rx for Naproxen 500mg  BID for now, 1-2 weeks then taper and start using Tylenol as  well, goal to reduce NSAID use for arthritis symptoms, encouraged heating pad etc 3. Future consider muscle relaxant given neuropathic pain as well 4. Follow-up 3 months physical, consider additional X-rays, PT      Relevant Medications   gabapentin (NEURONTIN) 300 MG capsule   naproxen (NAPROSYN) 500 MG tablet    Other Visit Diagnoses   None.     Meds ordered this encounter  Medications  . gabapentin (NEURONTIN) 300 MG capsule    Sig: Take 1-2 capsules (300-600 mg total) by mouth 4 (four) times daily.    Dispense:  300 capsule    Refill:  5  . escitalopram (LEXAPRO) 20 MG tablet    Sig: Take 1 tablet (20 mg total) by mouth daily.    Dispense:  90 tablet    Refill:  3  . naproxen (NAPROSYN) 500 MG tablet    Sig: Take 1 tablet (500 mg total) by mouth 2 (two) times daily with a meal.    Dispense:  60 tablet    Refill:  2      Follow up plan: Return in about 3 months (around 10/19/2016) for physical, labs.  Saralyn PilarAlexander Hemi Chacko, DO [facility] 07/19/2016, 1:36 PM

## 2016-07-19 NOTE — Assessment & Plan Note (Addendum)
Improved, mostly controlled sciatica and neuropathic pain on Gabapentin high dose 3000mg  daily, significantly reduced Aleve down 440mg  BID instead of prior 660 TID. No other OTC or conservative therapies.  Plan: 1. Refill Gabapentin high dose 300mg  caps QID (3-2-2-3 dosing), controls sciatica and helps sleep, no significant sedation. In future may need consider Lyrica for better control. 2. Sent rx for Naproxen 500mg  BID for now, 1-2 weeks then taper and start using Tylenol as well, goal to reduce NSAID use for arthritis symptoms, encouraged heating pad etc 3. Future consider muscle relaxant given neuropathic pain as well 4. Follow-up 3 months physical, consider additional X-rays, PT

## 2016-07-19 NOTE — Assessment & Plan Note (Signed)
Significant improvement on Lexapro 20mg  daily, PHQ9 down from 20 to 10, still somewhat difficult, overall more manageable.  Plan: 1. Continue Escitalopram 20mg  daily (90 day supply refills), anticipate will continue to improve, only 3-4 weeks currently 2. May establish with therapy as discussed 3. Follow-up 3 months physical

## 2016-07-19 NOTE — Assessment & Plan Note (Signed)
Suspected underlying etiology of some chronic pain in low back, knees, with DJD. - See Chronic Low Back Pain for plan - Continue NSAIDs, increase Tylenol, conservative measures

## 2016-07-19 NOTE — Assessment & Plan Note (Addendum)
Significant improvement on Lexapro 20mg  daily, GAD7 score down from 20 to 12, not difficult function. - Improved sleep, still significant anxious symptoms but more manageable   Plan: 1. Continue Escitalopram 20mg  daily (90 day supply and refills), anticipate at least 12 mo treatment for now, anticipate continued improvement, only at 3-4 weeks now 2. Re-advised may establish self referral with Therapist in near future once improved, may need for PRN follow-up if any worsening 3. Follow-up 3 months physical - future consider additional treatments such as buspar if needed

## 2016-07-19 NOTE — Patient Instructions (Addendum)
Thank you for coming in to clinic today.  1. For your Anxiety / Depression - It sounds like you are improving well, it has only been about 3 weeks - Continue taking 20mg  Lexapro daily, sent to pharmacy  2. For Pain - Continue Gabapentin 3 caps in morning, 2 with lunch and 2 afternoon, and 3 at bedtime, you are at 3000mg  daily which is safe - Let me know if rx needs to change  - Stop OTC ALeve. Start Naproxen 500mg  1 pill twice a day with food regularly for next 1-2 weeks then start working in some Tylenol doses or days you take a break from the Naproxen. - DO NOT TAKE any ibuprofen, aleve, motrin while you are taking this medicine - It is safe to take Tylenol Ext Str 500mg  tabs - take 1 to 2 (max dose 1000mg ) every 6 hours as needed for breakthrough pain, max 24 hour daily dose is 6 to 8 tablets or 4000mg   Consider one of the following Therapists, check to see if you are covered or what your cost is, we can talk more in the future, but these are SELF referrals only. So you dont need an order from me  1. [facility]   Address: [address], [place], [place] [address] Hours: Open today  9AM-7PM Phone: [phone]  2. Anell Barr[facility]yl [facility] Address: [address], [place], [place] [address] Phone: [phone]  Continue to work on cutting back smoking, this will be easier as your anxiety is better controlled.  Follow-up as scheduled for physical and labs before as scheduled.  If you have any other questions or concerns, please feel free to call the clinic or send a message through MyChart. You may also schedule an earlier appointment if necessary.  Saralyn PilarAlexander Ahsha Hinsley, DO [facility]

## 2016-08-01 ENCOUNTER — Encounter: Payer: Self-pay | Admitting: Family Medicine

## 2016-08-17 ENCOUNTER — Other Ambulatory Visit: Payer: Self-pay

## 2016-08-24 ENCOUNTER — Encounter: Payer: Self-pay | Admitting: Family Medicine

## 2016-09-12 IMAGING — CR DG CERVICAL SPINE COMPLETE 4+V
6 series · 6 of 6 positions shown · non-contrast
Comparison: None.

CLINICAL DATA: Fell 2 days ago, neck pain

EXAM:
CERVICAL SPINE - COMPLETE 4+ VIEW

[c-spine lat]
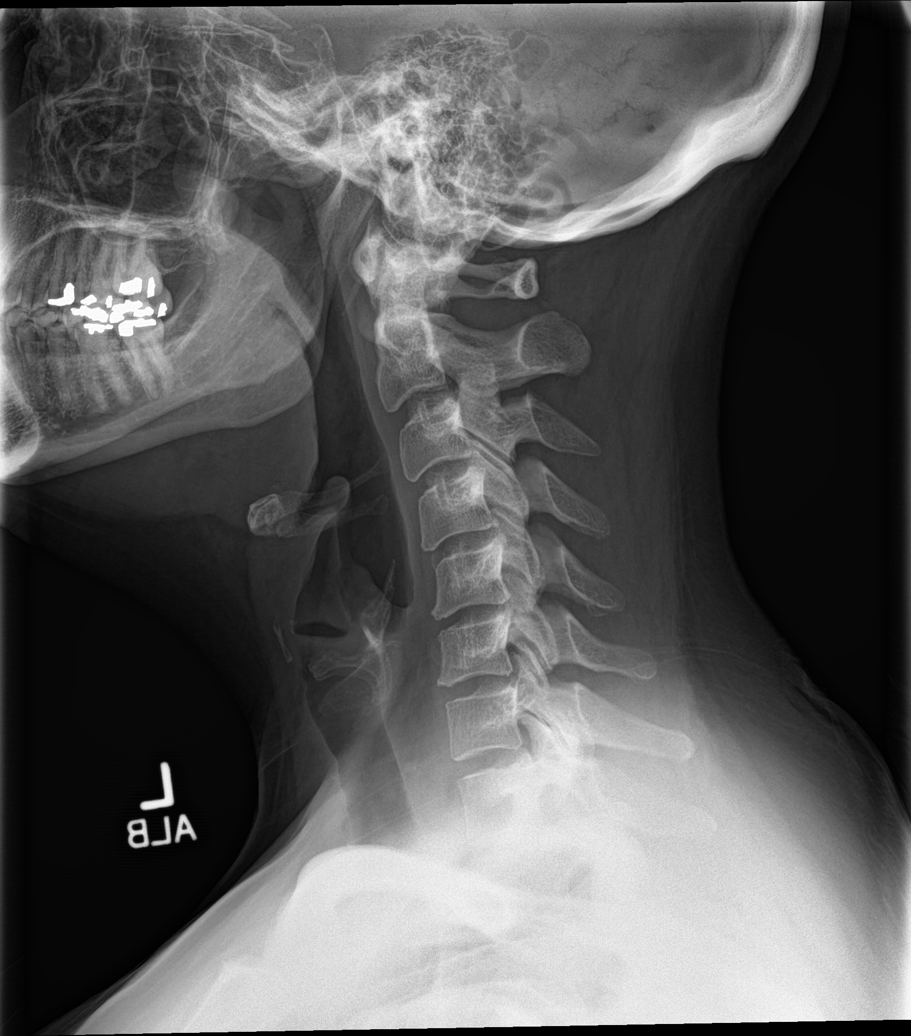

[c-spine obl (1 of 2)]
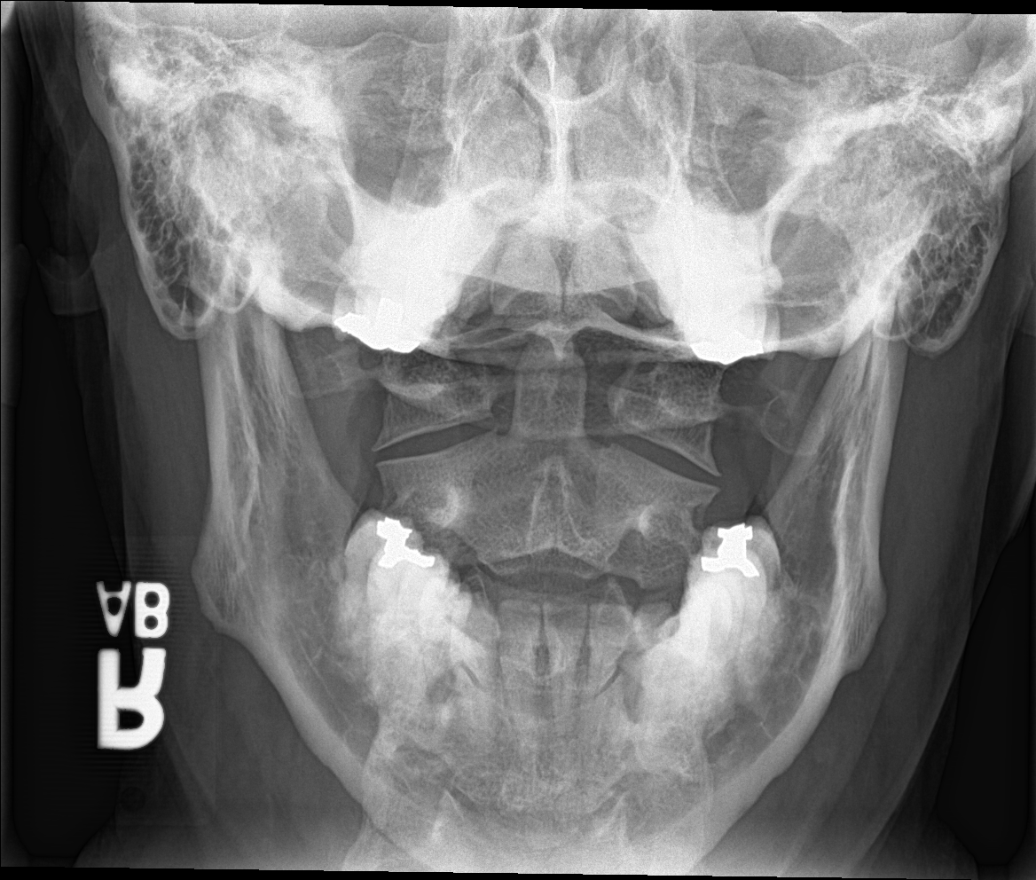

[c-spine obl (2 of 2)]
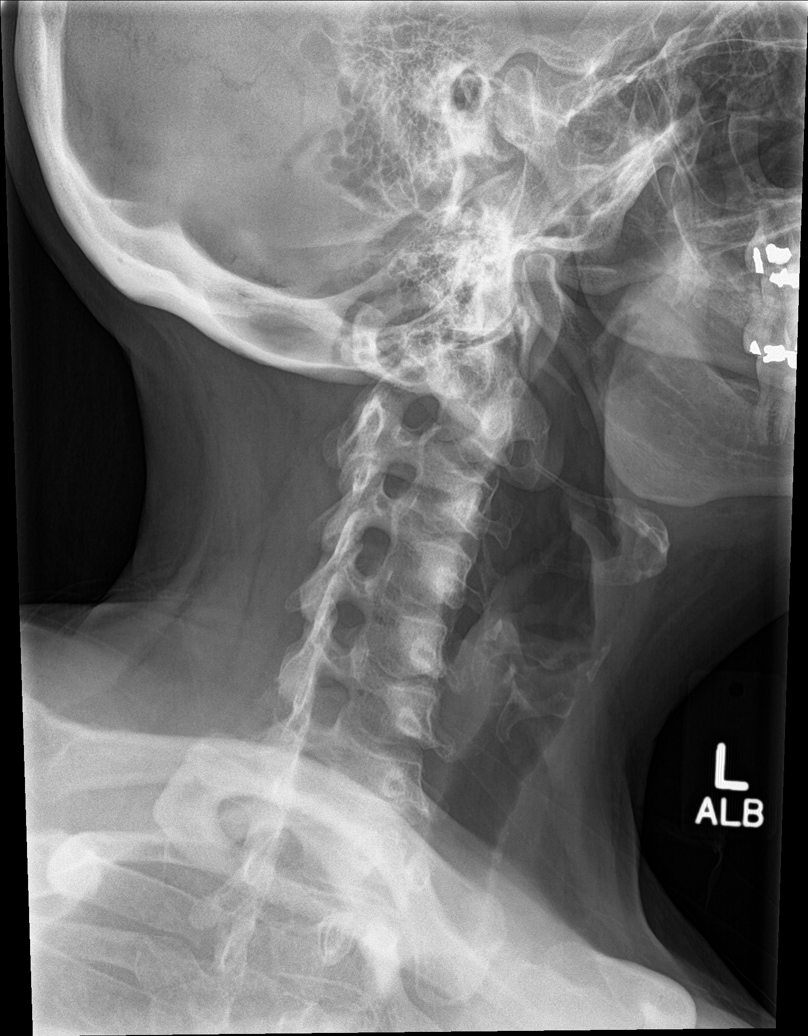

[c-spine ap (1 of 2)]
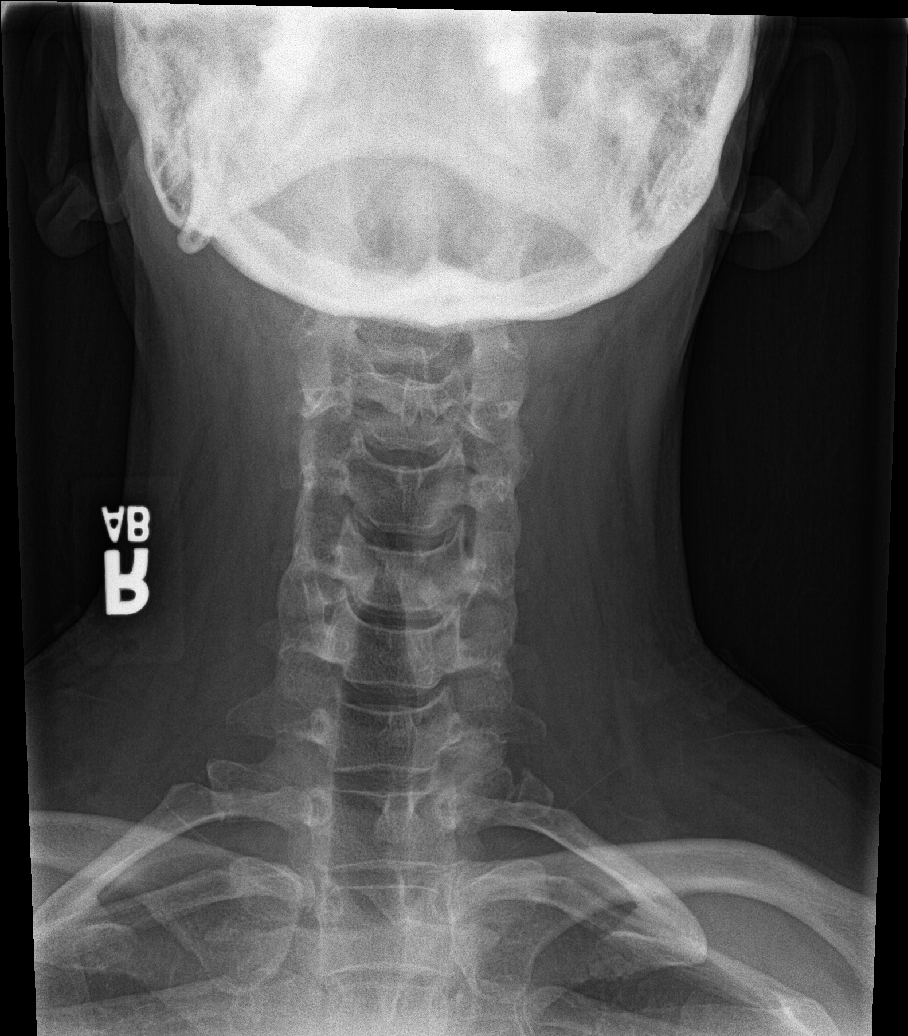

[c-spine open mouth]
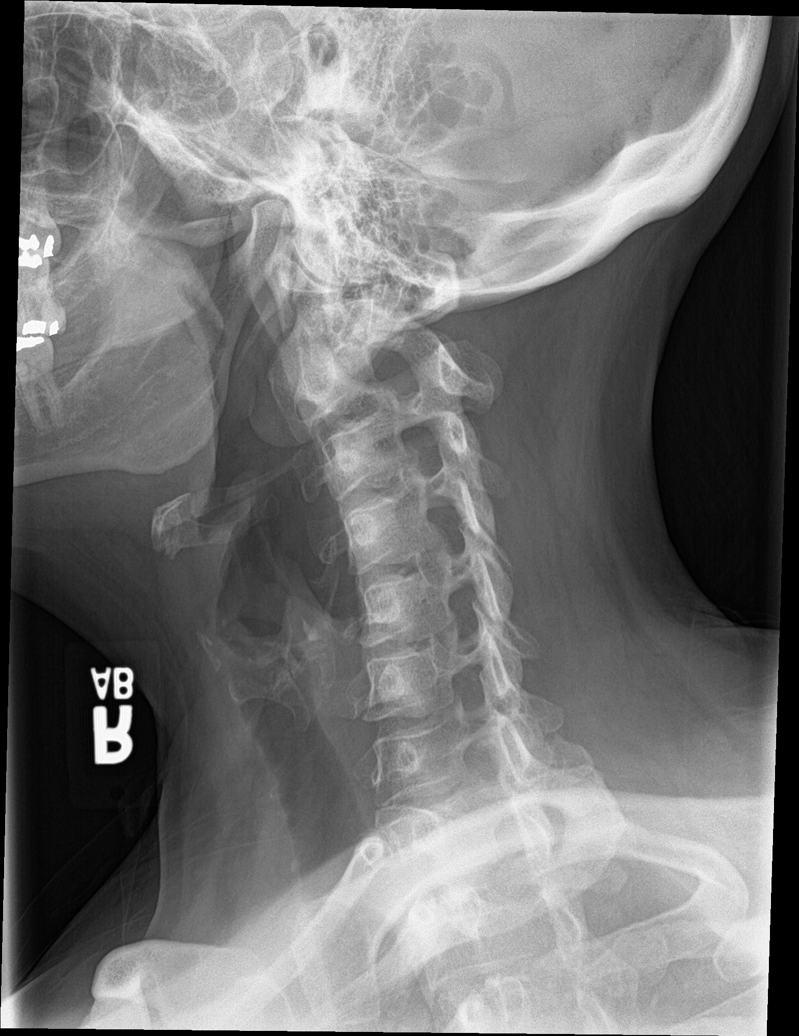

[c-spine ap (2 of 2)]
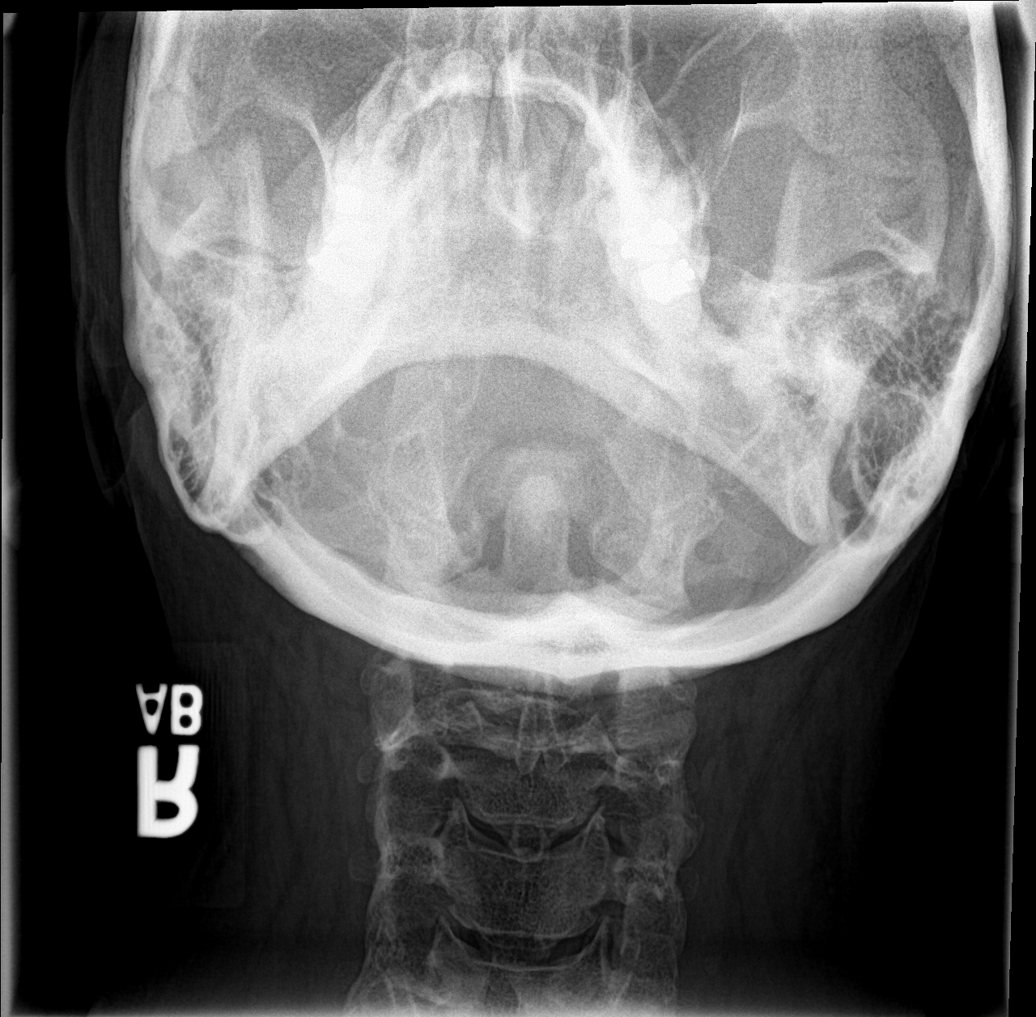

[6 of 6 positions shown; findings below may reference images not displayed]

FINDINGS: Reversed lordosis. Normal alignment. No prevertebral soft tissue
swelling. No fracture. Minimal degenerative disc disease.
IMPRESSION: Reversed lordosis possibly positional.  No fracture.

## 2016-12-07 ENCOUNTER — Other Ambulatory Visit: Payer: Self-pay | Admitting: Family Medicine

## 2016-12-07 DIAGNOSIS — M5442 Lumbago with sciatica, left side: Principal | ICD-10-CM

## 2016-12-07 DIAGNOSIS — G8929 Other chronic pain: Secondary | ICD-10-CM

## 2017-03-16 ENCOUNTER — Other Ambulatory Visit: Payer: Self-pay | Admitting: Family Medicine

## 2017-03-16 DIAGNOSIS — G8929 Other chronic pain: Secondary | ICD-10-CM

## 2017-03-16 DIAGNOSIS — M5442 Lumbago with sciatica, left side: Principal | ICD-10-CM

## 2017-06-07 ENCOUNTER — Other Ambulatory Visit: Payer: Self-pay | Admitting: Family Medicine

## 2017-06-07 DIAGNOSIS — G8929 Other chronic pain: Secondary | ICD-10-CM

## 2017-06-07 DIAGNOSIS — M5442 Lumbago with sciatica, left side: Principal | ICD-10-CM

## 2017-08-13 ENCOUNTER — Other Ambulatory Visit: Payer: Self-pay | Admitting: Family Medicine

## 2017-08-13 ENCOUNTER — Encounter: Payer: Self-pay | Admitting: Family Medicine

## 2017-08-13 DIAGNOSIS — F331 Major depressive disorder, recurrent, moderate: Secondary | ICD-10-CM

## 2017-08-13 DIAGNOSIS — M5442 Lumbago with sciatica, left side: Principal | ICD-10-CM

## 2017-08-13 DIAGNOSIS — G8929 Other chronic pain: Secondary | ICD-10-CM

## 2017-08-13 DIAGNOSIS — F411 Generalized anxiety disorder: Secondary | ICD-10-CM

## 2017-08-14 MED ORDER — GABAPENTIN 300 MG PO CAPS: 300.0000 mg | ORAL_CAPSULE | Freq: Four times a day (QID) | ORAL | 0 refills | Status: DC

## 2017-08-14 MED ORDER — ESCITALOPRAM OXALATE 20 MG PO TABS: 20.0000 mg | ORAL_TABLET | Freq: Every day | ORAL | 0 refills | Status: DC

## 2017-08-14 NOTE — Telephone Encounter (Signed)
If patient does not respond to message soon, can you contact her to find out which pharmacy for refill? She has 3 listed in chart.  Thanks  Saralyn PilarAlexander Karamalegos, DO [facility] 08/14/2017, 1:35 PM

## 2018-04-17 DIAGNOSIS — F4321 Adjustment disorder with depressed mood: Secondary | ICD-10-CM | POA: Diagnosis not present

## 2018-04-25 ENCOUNTER — Ambulatory Visit
Admission: EM | Admit: 2018-04-25 | Discharge: 2018-04-25 | Disposition: A | Discharge status: Home / Self Care | Payer: Medicaid Other | Attending: Family Medicine | Admitting: Family Medicine

## 2018-04-25 ENCOUNTER — Other Ambulatory Visit: Payer: Self-pay

## 2018-04-25 ENCOUNTER — Encounter: Payer: Self-pay | Admitting: Emergency Medicine

## 2018-04-25 DIAGNOSIS — M7918 Myalgia, other site: Secondary | ICD-10-CM | POA: Diagnosis not present

## 2018-04-25 DIAGNOSIS — M5442 Lumbago with sciatica, left side: Secondary | ICD-10-CM

## 2018-04-25 MED ORDER — PREDNISONE 10 MG PO TABS: ORAL_TABLET | ORAL | 0 refills | Status: DC

## 2018-04-25 MED ORDER — CYCLOBENZAPRINE HCL 10 MG PO TABS: 10.0000 mg | ORAL_TABLET | Freq: Two times a day (BID) | ORAL | 0 refills | Status: DC | PRN

## 2018-04-25 NOTE — ED Provider Notes (Signed)
[facility] ____________________________________________  Time seen: Approximately 3:36 PM  I have reviewed the triage vital signs and the nursing notes.   HISTORY  Chief Complaint Back Pain  HPI Christy Mayer is a 52 y.o. female presenting for evaluation of lower back pain present for approximately 1 week.  States last week she bent forward quickly and felt a pop in her back with accompanying pain.  States pain seemed to be somewhat improving, but reports yesterday she is slightly bent over and felt another pop re-aggravating the pain.  Patient reports that she does have a history of left-sided sciatica that she occasionally has issues with, but reports that this felt somewhat different.  States that she does also have her sciatic pain accompanying as well.  Denies any loss of sensation.  States that the pain does radiate down her left leg and occasionally feels tingling to her left foot, but again no full loss of sensation.  Denies any urinary or bowel retention or incontinence.  Denies any fall or direct or denies dysuria, abdominal pain, fevers.  Reports continues to eat and drink well.  States has been trying over-the-counter ibuprofen, Aleve direct trauma.  And Tylenol without any change.  Reports otherwise feels well. Denies recent sickness. Denies recent antibiotic use.   Smitty CordsKaramalegos, Alexander J, DO: PCP   Past Medical History:  Diagnosis Date  . Arthritis   . Asthma   . Back pain   . COPD (chronic obstructive pulmonary disease) (HCC)   . Emphysema lung (HCC)   . Heart palpitations   . Miscarriage   . Mitral valve prolapse   . Pneumonia     Patient Active Problem List   Diagnosis Date Noted  . Osteoarthritis, multiple sites 07/19/2016  . Depression 06/30/2016  . Chronic left-sided low back pain with left-sided sciatica 06/30/2016  . COPD (chronic obstructive pulmonary disease) (HCC) 06/30/2016  . Obesity (BMI 35.0-39.9 without comorbidity) 06/30/2016  .  GAD (generalized anxiety disorder) 09/17/2015  . 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 Tobacco use disorder 01/05/2015  . Pneumonia, organism unspecified(486) 01/05/2015  . Dyspnea 01/05/2015    Past Surgical History:  Procedure Laterality Date  . CESAREAN SECTION  11/2007     No current facility-administered medications for this encounter.   Current Outpatient Medications:  .  cyclobenzaprine (FLEXERIL) 10 MG tablet, Take 1 tablet (10 mg total) by mouth 2 (two) times daily as needed for muscle spasms. Do not drive while taking as can cause drowsiness, Disp: 15 tablet, Rfl: 0 .  predniSONE (DELTASONE) 10 MG tablet, Start 60 mg po day one, then 50 mg po day two, taper by 10 mg daily until complete., Disp: 21 tablet, Rfl: 0  Allergies Patient has no known allergies.  Family History  Adopted: Yes    Social History Social History   Tobacco Use  . Smoking status: Current Every Day Smoker    Packs/day: 0.50    Years: 30.00    Pack years: 15.00    Types: Cigarettes  . Smokeless tobacco: Never Used  Substance Use Topics  . Alcohol use: Yes    Alcohol/week: 0.0 oz    Comment: occasionally   . Drug use: No    Review of Systems Constitutional: No fever/chills Cardiovascular: Denies chest pain. Respiratory: Denies shortness of breath. Gastrointestinal: No abdominal pain.  No nausea, no vomiting.  No diarrhea.  No constipation. Genitourinary: Negative for dysuria. Musculoskeletal: positive for back pain. Skin: Negative for rash.   ____________________________________________  PHYSICAL EXAM:  VITAL SIGNS: ED Triage Vitals  Enc Vitals Group     BP 04/25/18 1321 (!) 143/80     Pulse Rate 04/25/18 1321 79     Resp 04/25/18 1321 16     Temp 04/25/18 1321 98.5 F (36.9 C)     Temp Source 04/25/18 1321 Oral     SpO2 04/25/18 1321 99 %     Weight 04/25/18 1319 200 lb (90.7 kg)     Height 04/25/18 1319 5\' 7"  (1.702 m)     Head Circumference --      Peak  Flow --      Pain Score 04/25/18 1319 10     Pain Loc --      Pain Edu? --      Excl. in GC? --     Constitutional: Alert and oriented. Well appearing and in no acute distress. ENT      Head: Normocephalic and atraumatic. Cardiovascular: Normal rate, regular rhythm. Grossly normal heart sounds.  Good peripheral circulation. Respiratory: Normal respiratory effort without tachypnea nor retractions. Breath sounds are clear and equal bilaterally. No wheezes, rales, rhonchi. Gastrointestinal: Soft and nontender.  No CVA tenderness. Musculoskeletal:   No midline cervical, thoracic or lumbar tenderness to palpation. Bilateral pedal pulses equal and easily palpated.      Right lower leg:  No tenderness or edema.      Left lower leg:  No tenderness or edema.  Except: Left posterior left tenderness laterally to SI joint as well as mild to moderate tenderness to left piriformis muscle and sciatic notch, no point midline lumbar tenderness, no rash, mild pain with lumbar flexion, no pain with lumbar extension, no pain with lumbar rotation, mild pain with left standing the left, able to weight-bear on each leg, no saddle anesthesia, bilateral plantar flexion dorsiflexion strong and equal.  Ambulatory with steady gait. Neurologic:  Normal speech and language. No gross focal neurologic deficits are appreciated. Speech is normal. No gait instability.  Skin:  Skin is warm, dry and intact. No rash noted. Psychiatric: Mood and affect are normal. Speech and behavior are normal. Patient exhibits appropriate insight and judgment   ___________________________________________   LABS (all labs ordered are listed, but only abnormal results are displayed)  Labs Reviewed - No data to display ____________________________________________   PROCEDURES Procedures  ___________________________________________   INITIAL IMPRESSION / ASSESSMENT AND PLAN / ED COURSE  Pertinent labs & imaging results that were  available during my care of the patient were reviewed by me and considered in my medical decision making (see chart for details).  Well-appearing patient.  No acute distress.  Left lower back pain.  Patient tenderness along piriformis muscle and suspect left sciatica.  Patient has been taking NSAIDs without resolution.  Will treat with prednisone taper and PRN muscle relaxant.  No point bony tenderness, will defer imaging at this time, patient agrees.  Encourage follow-up, discussed stretching and supportive care.Discussed indication, risks and benefits of medications with patient.  Discussed follow up with Primary care physician this week. Discussed follow up and return parameters including no resolution or any worsening concerns. Patient verbalized understanding and agreed to plan.   ____________________________________________   FINAL CLINICAL IMPRESSION(S) / ED DIAGNOSES  Final diagnoses:  Acute left-sided low back pain with left-sided sciatica  Piriformis muscle pain     ED Discharge Orders        Ordered    predniSONE (DELTASONE) 10 MG tablet     04/25/18 1414  cyclobenzaprine (FLEXERIL) 10 MG tablet  2 times daily PRN     04/25/18 1414       Note: This dictation was prepared with Dragon dictation along with smaller phrase technology. Any transcriptional errors that result from this process are unintentional.         Renford Dills, NP 04/25/18 1541

## 2018-04-25 NOTE — ED Triage Notes (Signed)
Patient c/o lower back pain that started after she bent over to pick something up and felt a pop in her back.

## 2018-04-25 NOTE — Discharge Instructions (Signed)
Take medication as prescribed. Rest. Drink plenty of fluids. Stretch.  ° °Follow up with your primary care physician this week as needed. Return to Urgent care for new or worsening concerns.  ° °

## 2018-05-01 DIAGNOSIS — F4321 Adjustment disorder with depressed mood: Secondary | ICD-10-CM | POA: Diagnosis not present

## 2018-05-10 DIAGNOSIS — F4321 Adjustment disorder with depressed mood: Secondary | ICD-10-CM | POA: Diagnosis not present

## 2018-05-22 DIAGNOSIS — F4321 Adjustment disorder with depressed mood: Secondary | ICD-10-CM | POA: Diagnosis not present

## 2018-05-29 DIAGNOSIS — F4321 Adjustment disorder with depressed mood: Secondary | ICD-10-CM | POA: Diagnosis not present

## 2018-06-05 DIAGNOSIS — F4321 Adjustment disorder with depressed mood: Secondary | ICD-10-CM | POA: Diagnosis not present

## 2018-06-07 DIAGNOSIS — F4321 Adjustment disorder with depressed mood: Secondary | ICD-10-CM | POA: Diagnosis not present

## 2018-06-13 DIAGNOSIS — F4321 Adjustment disorder with depressed mood: Secondary | ICD-10-CM | POA: Diagnosis not present

## 2018-06-20 DIAGNOSIS — F4321 Adjustment disorder with depressed mood: Secondary | ICD-10-CM | POA: Diagnosis not present

## 2018-06-27 DIAGNOSIS — F4321 Adjustment disorder with depressed mood: Secondary | ICD-10-CM | POA: Diagnosis not present

## 2018-06-28 DIAGNOSIS — F4321 Adjustment disorder with depressed mood: Secondary | ICD-10-CM | POA: Diagnosis not present

## 2018-06-29 DIAGNOSIS — F4321 Adjustment disorder with depressed mood: Secondary | ICD-10-CM | POA: Diagnosis not present

## 2018-06-30 DIAGNOSIS — F4321 Adjustment disorder with depressed mood: Secondary | ICD-10-CM | POA: Diagnosis not present

## 2018-07-02 DIAGNOSIS — F4321 Adjustment disorder with depressed mood: Secondary | ICD-10-CM | POA: Diagnosis not present

## 2018-07-04 DIAGNOSIS — F4321 Adjustment disorder with depressed mood: Secondary | ICD-10-CM | POA: Diagnosis not present

## 2018-07-05 DIAGNOSIS — F4321 Adjustment disorder with depressed mood: Secondary | ICD-10-CM | POA: Diagnosis not present

## 2018-07-06 DIAGNOSIS — F4321 Adjustment disorder with depressed mood: Secondary | ICD-10-CM | POA: Diagnosis not present

## 2018-07-10 DIAGNOSIS — F4321 Adjustment disorder with depressed mood: Secondary | ICD-10-CM | POA: Diagnosis not present

## 2018-07-11 DIAGNOSIS — F4321 Adjustment disorder with depressed mood: Secondary | ICD-10-CM | POA: Diagnosis not present

## 2018-07-14 DIAGNOSIS — F4321 Adjustment disorder with depressed mood: Secondary | ICD-10-CM | POA: Diagnosis not present

## 2018-07-17 DIAGNOSIS — F4321 Adjustment disorder with depressed mood: Secondary | ICD-10-CM | POA: Diagnosis not present

## 2018-07-18 DIAGNOSIS — F4321 Adjustment disorder with depressed mood: Secondary | ICD-10-CM | POA: Diagnosis not present

## 2018-07-25 DIAGNOSIS — F4321 Adjustment disorder with depressed mood: Secondary | ICD-10-CM | POA: Diagnosis not present

## 2018-07-26 DIAGNOSIS — F4321 Adjustment disorder with depressed mood: Secondary | ICD-10-CM | POA: Diagnosis not present

## 2018-07-27 DIAGNOSIS — F4321 Adjustment disorder with depressed mood: Secondary | ICD-10-CM | POA: Diagnosis not present

## 2018-07-30 ENCOUNTER — Other Ambulatory Visit: Payer: Self-pay

## 2018-07-30 DIAGNOSIS — E782 Mixed hyperlipidemia: Secondary | ICD-10-CM | POA: Insufficient documentation

## 2018-07-30 DIAGNOSIS — M159 Polyosteoarthritis, unspecified: Secondary | ICD-10-CM

## 2018-07-30 DIAGNOSIS — J432 Centrilobular emphysema: Secondary | ICD-10-CM

## 2018-07-30 DIAGNOSIS — F4321 Adjustment disorder with depressed mood: Secondary | ICD-10-CM | POA: Diagnosis not present

## 2018-07-30 DIAGNOSIS — E781 Pure hyperglyceridemia: Secondary | ICD-10-CM

## 2018-07-30 DIAGNOSIS — R7309 Other abnormal glucose: Secondary | ICD-10-CM

## 2018-07-30 DIAGNOSIS — M150 Primary generalized (osteo)arthritis: Secondary | ICD-10-CM

## 2018-07-30 DIAGNOSIS — E669 Obesity, unspecified: Secondary | ICD-10-CM

## 2018-07-31 LAB — CBC WITH DIFFERENTIAL/PLATELET
Basophils Absolute: 39 cells/uL (ref 0–200)
Basophils Relative: 0.7 %
Eosinophils Absolute: 140 cells/uL (ref 15–500)
Eosinophils Relative: 2.5 %
HCT: 46.9 % — ABNORMAL HIGH (ref 35.0–45.0)
Hemoglobin: 16 g/dL — ABNORMAL HIGH (ref 11.7–15.5)
Lymphs Abs: 1646 cells/uL (ref 850–3900)
MCH: 32.3 pg (ref 27.0–33.0)
MCHC: 34.1 g/dL (ref 32.0–36.0)
MCV: 94.7 fL (ref 80.0–100.0)
MPV: 12.8 fL — ABNORMAL HIGH (ref 7.5–12.5)
Monocytes Relative: 7 %
Neutro Abs: 3382 cells/uL (ref 1500–7800)
Neutrophils Relative %: 60.4 %
Platelets: 215 10*3/uL (ref 140–400)
RBC: 4.95 10*6/uL (ref 3.80–5.10)
RDW: 12 % (ref 11.0–15.0)
Total Lymphocyte: 29.4 %
WBC mixed population: 392 cells/uL (ref 200–950)
WBC: 5.6 10*3/uL (ref 3.8–10.8)

## 2018-07-31 LAB — COMPREHENSIVE METABOLIC PANEL
AG Ratio: 1.7 (calc) (ref 1.0–2.5)
ALT: 24 U/L (ref 6–29)
AST: 22 U/L (ref 10–35)
Albumin: 4.8 g/dL (ref 3.6–5.1)
Alkaline phosphatase (APISO): 79 U/L (ref 33–130)
BUN: 11 mg/dL (ref 7–25)
CO2: 28 mmol/L (ref 20–32)
Calcium: 10.2 mg/dL (ref 8.6–10.4)
Chloride: 102 mmol/L (ref 98–110)
Creat: 0.89 mg/dL (ref 0.50–1.05)
Globulin: 2.8 g/dL (calc) (ref 1.9–3.7)
Glucose, Bld: 97 mg/dL (ref 65–99)
Potassium: 4.9 mmol/L (ref 3.5–5.3)
Sodium: 138 mmol/L (ref 135–146)
Total Bilirubin: 0.8 mg/dL (ref 0.2–1.2)
Total Protein: 7.6 g/dL (ref 6.1–8.1)

## 2018-07-31 LAB — LIPID PANEL
CHOL/HDL RATIO: 5.9 (calc) — AB (ref <5.0)
CHOLESTEROL: 258 mg/dL — AB (ref <200)
HDL: 44 mg/dL — ABNORMAL LOW (ref >50)
LDL CHOLESTEROL (CALC): 172 mg/dL — AB
Non-HDL Cholesterol (Calc): 214 mg/dL (calc) — ABNORMAL HIGH (ref <130)
TRIGLYCERIDES: 254 mg/dL — AB (ref <150)

## 2018-07-31 LAB — HEMOGLOBIN A1C
EAG (MMOL/L): 6.3 (calc)
HEMOGLOBIN A1C: 5.6 %{Hb} (ref <5.7)
Mean Plasma Glucose: 114 (calc)

## 2018-08-01 DIAGNOSIS — F4321 Adjustment disorder with depressed mood: Secondary | ICD-10-CM | POA: Diagnosis not present

## 2018-08-02 DIAGNOSIS — Z79899 Other long term (current) drug therapy: Secondary | ICD-10-CM | POA: Diagnosis not present

## 2018-08-02 DIAGNOSIS — F4321 Adjustment disorder with depressed mood: Secondary | ICD-10-CM | POA: Diagnosis not present

## 2018-08-02 DIAGNOSIS — F4312 Post-traumatic stress disorder, chronic: Secondary | ICD-10-CM | POA: Diagnosis not present

## 2018-08-06 ENCOUNTER — Encounter: Payer: Self-pay | Admitting: Family Medicine

## 2018-08-06 ENCOUNTER — Ambulatory Visit (INDEPENDENT_AMBULATORY_CARE_PROVIDER_SITE_OTHER): Payer: Medicaid Other | Admitting: Family Medicine

## 2018-08-06 VITALS — BP 129/74 | HR 86 | Temp 98.8°F | Resp 16 | Ht 67.0 in | Wt 230.0 lb

## 2018-08-06 DIAGNOSIS — M5442 Lumbago with sciatica, left side: Secondary | ICD-10-CM

## 2018-08-06 DIAGNOSIS — E782 Mixed hyperlipidemia: Secondary | ICD-10-CM

## 2018-08-06 DIAGNOSIS — J432 Centrilobular emphysema: Secondary | ICD-10-CM | POA: Diagnosis not present

## 2018-08-06 DIAGNOSIS — R7309 Other abnormal glucose: Secondary | ICD-10-CM | POA: Diagnosis not present

## 2018-08-06 DIAGNOSIS — M150 Primary generalized (osteo)arthritis: Secondary | ICD-10-CM | POA: Diagnosis not present

## 2018-08-06 DIAGNOSIS — Z0000 Encounter for general adult medical examination without abnormal findings: Secondary | ICD-10-CM | POA: Diagnosis not present

## 2018-08-06 DIAGNOSIS — F17200 Nicotine dependence, unspecified, uncomplicated: Secondary | ICD-10-CM

## 2018-08-06 DIAGNOSIS — F411 Generalized anxiety disorder: Secondary | ICD-10-CM

## 2018-08-06 DIAGNOSIS — F339 Major depressive disorder, recurrent, unspecified: Secondary | ICD-10-CM

## 2018-08-06 DIAGNOSIS — N951 Menopausal and female climacteric states: Secondary | ICD-10-CM | POA: Diagnosis not present

## 2018-08-06 DIAGNOSIS — F4321 Adjustment disorder with depressed mood: Secondary | ICD-10-CM | POA: Diagnosis not present

## 2018-08-06 DIAGNOSIS — N926 Irregular menstruation, unspecified: Secondary | ICD-10-CM | POA: Diagnosis not present

## 2018-08-06 DIAGNOSIS — E669 Obesity, unspecified: Secondary | ICD-10-CM

## 2018-08-06 DIAGNOSIS — K219 Gastro-esophageal reflux disease without esophagitis: Secondary | ICD-10-CM

## 2018-08-06 DIAGNOSIS — Z1239 Encounter for other screening for malignant neoplasm of breast: Secondary | ICD-10-CM

## 2018-08-06 DIAGNOSIS — M159 Polyosteoarthritis, unspecified: Secondary | ICD-10-CM

## 2018-08-06 DIAGNOSIS — G8929 Other chronic pain: Secondary | ICD-10-CM

## 2018-08-06 MED ORDER — OMEPRAZOLE 40 MG PO CPDR: 40.0000 mg | DELAYED_RELEASE_CAPSULE | Freq: Every day | ORAL | 3 refills | Status: DC

## 2018-08-06 NOTE — Assessment & Plan Note (Addendum)
Stable without flare up Still active smoker, reducing amount Clinical prior history mild emphysema In past off Advair, Spiriva, without good results  Smoking cessation Follow-up if flare or other concern

## 2018-08-06 NOTE — Patient Instructions (Addendum)
Thank you for coming to the office today.  Referral sent for Pap smear and follow-up abnormal cycle  [facility] [address] [place], [place] [address] Hours: [place]: [phone]  For Mammogram screening for breast cancer   Call the [facility] below anytime to schedule your own appointment now that order has been placed.  [facility] [address] [place], [place] [address] Phone: [phone]  1. Chemistry - Normal results, including electrolytes, kidney and liver function. - Normal fasting blood sugar   2. Hemoglobin A1c (Diabetes screening) - 5.6, slightly elevated, but still normal not in range of Pre-Diabetes (>5.7 to 6.4)   3. CBC Blood Counts - Mildly elevated Hemoglobin, without anemia, no other significant abnormality  4. Cholesterol - Abnormal cholesterol results. - Slightly low HDL (good cholesterol) - Significantly elevated 172 LDL (bad cholesterol) - Elevated 254 Triglycerides. We can discuss Statin cholesterol medication in office.  Follow-up as planned.  ----------------------------------------  Nicotine patches - try this and can get free supply from [employer]  1 800-QUIT NOW  If bleeding keeps changing may need GYN specialist.  Colon Cancer Screening: - For all adults age 50+ routine colon cancer screening is highly recommended.     - Recent guidelines from [place] recommend starting age of 32 - Early detection of colon cancer is important, because often there are no warning signs or symptoms, also if found early usually it can be cured. Late stage is hard to treat.  - If you are not interested in Colonoscopy screening (if done and normal you could be cleared for 5 to 10 years until next due), then Cologuard is an excellent alternative for screening test for Colon Cancer. It is highly sensitive for detecting DNA of colon cancer from even the  earliest stages. Also, there is NO bowel prep required. - If Cologuard is NEGATIVE, then it is good for 3 years before next due - If Cologuard is POSITIVE, then it is strongly advised to get a Colonoscopy, which allows the GI doctor to locate the source of the cancer or polyp (even very early stage) and treat it by removing it. ------------------------- If you would like to proceed with Cologuard (stool DNA test) - FIRST, call your insurance company and tell them you want to check cost of Cologuard tell them CPT Code [phone] (it may be completely covered and you could get for no cost, OR max cost without any coverage is about $600). Also, keep in mind if you do NOT open the kit, and decide not to do the test, you will NOT be charged, you should contact the company if you decide not to do the test. - If you want to proceed, you can notify us (phone message, [place], or at next visit) and we will order it for you. The test kit will be delivered to you house within about 1 week. Follow instructions to collect sample, you may call the company for any help or questions, 24/7 telephone support at [phone].   Please schedule a Follow-up Appointment to: Return in about 4 months (around 12/07/2018) for Elevated A1c, Weight, Back Pain.  If you have any other questions or concerns, please feel free to call the office or send a message through [place]. You may also schedule an earlier appointment if necessary.  Additionally, you may be receiving a survey about your experience at our office within a few days to 1  week by e-mail or mail. We value your feedback.  Nobie Putnam, DO [place]

## 2018-08-06 NOTE — Assessment & Plan Note (Signed)
Active smoker Ready to quit Re-attempt NRT patches, handout given, QUITLINE # given for support and patches Defer medication rx with Chantix/Wellbutrin for now - she may consider this option to ask Psychiatry in future  Discussion today >5 minutes (<10 minutes) specifically on counseling on risks of tobacco use, complications, treatment, smoking cessation.

## 2018-08-06 NOTE — Progress Notes (Addendum)
Subjective:    Patient ID: Christy Mayer, female    DOB: April 02, 1966, 52 y.o.   MRN: [id number]  Christy Mayer is a 52 y.o. female presenting on 08/06/2018 for Annual Exam   HPI   Here for Annual Physical and Lab Review. Of note patient has returned to care at our clinic, had been >2 years since last visit.  Abnormal Menstrual Cycle - History of menopause in past with 1-2 year without menstrual cycle, he was asymptomatic at that time without hot flashes or other menopausal additional symptoms. Now recently had heavier irregular cycle suddenly, asking about this. She does not have GYN but is interested for 2nd opinion  Major depression, recurrent / GAD - Now recently established with Psychiatry [facility] ) / Therapist ([facility]) - She has now been more active in her mental health in seeking help, just recently within past week has seen Psych and started on medication with Duloxetine 30mg  daily, now titrated up after 1 week to 2 pills for dose 60mg  daily - Also on Trazodone 50mg  up to 1-4 pills PRN insomnia, usually taking 2 pills nightly approx 100mg  - See PHQ GAD score below, note very severely elevated scores, but she states this was more reflecting BEFORE she started medication in past week, since questionnaire asks about 2 weeks  GERD Chronic problem, took Nexium OTC nightly, not every night for while with mixed results Still has worsening night time GERD symptoms  Elevated A1c / Obesity BMI >36 No prior elevated A1c or PreDM. Does not know family history as she was adopted, unsure if DM in family. A1c now is up to 5.6 Lifestyle: - With weight gain recently - Diet: eats mostly fresh vegetables, chicken, some red meats, drinks mostly water - Exercise: Trying to improve walking  HYPERLIPIDEMIA: - Reports no concerns. Last lipid panel elevated LDL >170, and TG >250 - Not on cholesterol medicine  COPD (Mild emphysema) / Tobacco Abuse  Active smoker,  0.5ppd, smoking off and on since 11+ so approx 30-40 years. Quit for 4-5 years after children, was only social smoking. She interested in quitting with nicotine patches. Has not tried medication. - Known history of some COPD in past. No problem or flare up recently, does not have inhalers, rarely has issue  History of back pain / Sciatica Admits episodes of low back pain, has been seen at ED / UC for this before Finished short course of Prednisone, Flexeril, limited relief. Previously on gabapentin. Walking some to help improve but still seems to bother her and limit her function  Health Maintenance:  UTD routine HIV screen 09/16/15, negative.  Breast CA Screening: Due for mammogram screening. No prior mammogram ever. Unknown family history of breast cancer - since adopted. Currently asymptomatic.  Colon CA Screening: Never had colon cancer screening before. Currently asymptomatic. Unknown family history of colon CA - since adopted Due for screening test - offered colonoscopy vs cologuard, she will notify us   Depression screen [facility] 2/9 08/06/2018 07/19/2016 06/30/2016  Decreased Interest 3 1 3   Down, Depressed, Hopeless 3 3 3   PHQ - 2 Score 6 4 6   Altered sleeping 3 3 3   Tired, decreased energy 3 1 3   Change in appetite 3 0 1  Feeling bad or failure about yourself  3 1 2   Trouble concentrating 3 1 3   Moving slowly or fidgety/restless 1 0 2  Suicidal thoughts 3 0 0  PHQ-9 Score 25 10 20   Difficult  doing work/chores Very difficult Somewhat difficult Somewhat difficult   [place]-Suicide Severity Rating Scale 1) Have you wished you were dead or wished you could go to sleep and not wake up? - Yes  2) Have you had any actual thoughts of killing yourself? - Yes  3) Active Suicidal ideation without plan and without intent to act - No (she denies intent)  4) No intent  5) No specific plan  6) Have you ever done anything, started to do anything, or prepared to do anything to end your  life? - No   GAD 7 : Generalized Anxiety Score 08/06/2018 07/19/2016 06/30/2016  Nervous, Anxious, on Edge 3 2 3   Control/stop worrying 3 2 3   Worry too much - different things 3 2 3   Trouble relaxing 3 1 3   Restless 3 1 3   Easily annoyed or irritable 3 3 3   Afraid - awful might happen 3 1 2   Total GAD 7 Score 21 12 20   Anxiety Difficulty Very difficult Not difficult at all Not difficult at all    Past Medical History:  Diagnosis Date  . Arthritis   . Asthma   . Back pain   . COPD (chronic obstructive pulmonary disease) (HCC)   . Emphysema lung (HCC)   . Heart palpitations   . Miscarriage   . Mitral valve prolapse    Past Surgical History:  Procedure Laterality Date  . CESAREAN SECTION  11/2007   Social History   Socioeconomic History  . Marital status: Married    Spouse name: Not on file  . Number of children: Not on file  . Years of education: Not on file  . Highest education level: Not on file  Occupational History  . Occupation: work from home  Social Needs  . Financial resource strain: Not on file  . Food insecurity:    Worry: Not on file    Inability: Not on file  . Transportation needs:    Medical: Not on file    Non-medical: Not on file  Tobacco Use  . Smoking status: Current Every Day Smoker    Packs/day: 0.50    Years: 30.00    Pack years: 15.00    Types: Cigarettes  . Smokeless tobacco: Current User  Substance and Sexual Activity  . Alcohol use: Yes    Alcohol/week: 0.0 standard drinks    Comment: occasionally   . Drug use: No  . Sexual activity: Yes    Birth control/protection: None  Lifestyle  . Physical activity:    Days per week: Not on file    Minutes per session: Not on file  . Stress: Not on file  Relationships  . Social connections:    Talks on phone: Not on file    Gets together: Not on file    Attends religious service: Not on file    Active member of club or organization: Not on file    Attends meetings of clubs or  organizations: Not on file    Relationship status: Not on file  . Intimate partner violence:    Fear of current or ex partner: Not on file    Emotionally abused: Not on file    Physically abused: Not on file    Forced sexual activity: Not on file  Other Topics Concern  . Not on file  Social History Narrative  . Not on file   Family History  Adopted: Yes   Current Outpatient Medications on File Prior to Visit  Medication Sig  .  DULoxetine (CYMBALTA) 30 MG capsule Take 60 mg by mouth daily.  . traZODone (DESYREL) 50 MG tablet Take 50-200 mg by mouth at bedtime as needed.   No current facility-administered medications on file prior to visit.     Review of Systems  Constitutional: Negative for activity change, appetite change, chills, diaphoresis, fatigue and fever.  HENT: Negative for congestion and hearing loss.   Eyes: Negative for visual disturbance.  Respiratory: Negative for apnea, cough, choking, chest tightness, shortness of breath and wheezing.   Cardiovascular: Negative for chest pain, palpitations and leg swelling.  Gastrointestinal: Negative for abdominal pain, anal bleeding, blood in stool, constipation, diarrhea, nausea and vomiting.  Endocrine: Negative for cold intolerance.  Genitourinary: Positive for menstrual problem (irregular). Negative for difficulty urinating, dysuria, frequency and hematuria.  Musculoskeletal: Positive for back pain (with sciatica). Negative for arthralgias and neck pain.  Skin: Negative for rash.  Allergic/Immunologic: Negative for environmental allergies.  Neurological: Negative for dizziness, weakness, light-headedness, numbness and headaches.  Hematological: Negative for adenopathy.  Psychiatric/Behavioral: Positive for dysphoric mood (Improved on med) and sleep disturbance (Improved on med). Negative for behavioral problems. The patient is nervous/anxious (Improved on med).    Per HPI unless specifically indicated above       Objective:    BP 129/74   Pulse 86   Temp 98.8 F (37.1 C) (Oral)   Resp 16   Ht 5\' 7"  (1.702 m)   Wt 230 lb (104.3 kg)   LMP 08/11/2014   BMI 36.02 kg/m   Wt Readings from Last 3 Encounters:  08/06/18 230 lb (104.3 kg)  04/25/18 200 lb (90.7 kg)  07/19/16 245 lb (111.1 kg)    Physical Exam  Constitutional: She is oriented to person, place, and time. She appears well-developed and well-nourished. No distress.  Well-appearing, comfortable, cooperative, obese  HENT:  Head: Normocephalic and atraumatic.  Mouth/Throat: Oropharynx is clear and moist.  Eyes: Pupils are equal, round, and reactive to light. Conjunctivae and EOM are normal. Right eye exhibits no discharge. Left eye exhibits no discharge.  Neck: Normal range of motion. Neck supple. No thyromegaly present.  Cardiovascular: Normal rate, regular rhythm, normal heart sounds and intact distal pulses.  No murmur heard. Pulmonary/Chest: Effort normal and breath sounds normal. No respiratory distress. She has no wheezes. She has no rales.  Abdominal: Soft. Bowel sounds are normal. She exhibits no distension and no mass. There is no tenderness.  Musculoskeletal: Normal range of motion. She exhibits no edema or tenderness.  Upper / Lower Extremities: - Normal muscle tone, strength bilateral upper extremities 5/5, lower extremities 5/5  Lymphadenopathy:    She has no cervical adenopathy.  Neurological: She is alert and oriented to person, place, and time.  Distal sensation intact to light touch all extremities  Skin: Skin is warm and dry. No rash noted. She is not diaphoretic. No erythema.  Psychiatric: She has a normal mood and affect. Her behavior is normal.  Well groomed, good eye contact, normal speech and thoughts. Good insight into mental health. Does not appear depressed today on exam, and does not appear anxious  Nursing note and vitals reviewed.  Results for orders placed or performed in visit on 08/06/18  HM HIV  SCREENING LAB  Result Value Ref Range   HM HIV Screening Negative - Validated       Assessment & Plan:   Problem List Items Addressed This Visit    Chronic left-sided low back pain with left-sided sciatica  Not focus of visit today Recent prednisone/flexeril limited relief Known long term problem episodic pain In past on gabapentin, NSAID Will return if not improving      Relevant Medications   traZODone (DESYREL) 50 MG tablet   DULoxetine (CYMBALTA) 30 MG capsule   COPD (chronic obstructive pulmonary disease) (HCC)    Stable without flare up Still active smoker, reducing amount Clinical prior history mild emphysema In past off Advair, Spiriva, without good results  Smoking cessation Follow-up if flare or other concern      Elevated hemoglobin A1c    Elevated A1c 5.6 but not in range of PreDM Concern with obesity, HLD  Plan:  1. Not on any therapy currently  2. Encourage improved lifestyle - low carb, low sugar diet, reduce portion size, continue improving regular exercise 3. Follow-up 4 months A1c       GAD (generalized anxiety disorder)    Severe elevated GAD score Now established with Psychiatry CBC - Continue current treatment See A&P Depression      Relevant Medications   traZODone (DESYREL) 50 MG tablet   DULoxetine (CYMBALTA) 30 MG capsule   Gastroesophageal reflux disease    Some refractory GERD on intermittent night PPI No other obvious red flag GI symptoms Likely with obesity and diet  Switch to rx Omeprazole 40mg  daily in AM before meal, may add PM antacid Follow-up as planned      Relevant Medications   omeprazole (PRILOSEC) 40 MG capsule   Major depression, recurrent, chronic (HCC)    Recent severe depressive symptoms, chronic problem, recurrent Seems improved now on SNRI with Duloxetine dose increasing and on Trazodone for secondary insomnia and mood Now followed/managed by Psychiatry - [facility] / Therapist - Continue  current management per Psych, no changes - See PHQ - severe score, see score 3 for suicidal - however she has no plan or intent, and signed safety contract with me today for reassurance, she is aware who/how to contact if need help      Relevant Medications   traZODone (DESYREL) 50 MG tablet   DULoxetine (CYMBALTA) 30 MG capsule   Mixed hyperlipidemia    Uncontrolled cholesterol improving lifestyle Last lipid panel 07/2018 Calculated ASCVD 10 yr risk score 3% non smoker but up to 7% as smoker  Plan: 1. Agree to defer Statin therapy, given ASCVD is not at indicated level and primarily due to smoking, will counsel on smoking cessation instead 2. Encourage improved lifestyle - low carb/cholesterol, reduce portion size, continue improving regular exercise      Obesity (BMI 35.0-39.9 without comorbidity)    Recent weight gain likely behavioral with mood/smoking and other factors Encourage for now goal is to quit smoking, emphasis on lifestyle diet, improving diet first, as discussed low carb, reduce portions, future can increase exercise as tolerated      Osteoarthritis, multiple sites    Suspected underlying etiology of some chronic pain in low back, knees, with DJD. Follow-up in future if not improve - will return       Tobacco use disorder    Active smoker Ready to quit Re-attempt NRT patches, handout given, QUITLINE # given for support and patches Defer medication rx with Chantix/Wellbutrin for now - she may consider this option to ask Psychiatry in future  Discussion today >5 minutes (<10 minutes) specifically on counseling on risks of tobacco use, complications, treatment, smoking cessation.        Other Visit Diagnoses    Annual physical exam    -  Primary   Screening for breast cancer       Relevant Orders   MM DIGITAL SCREENING BILATERAL   Abnormal menstrual cycle       Question if postmenopausal or still perimenopausal, will refer to GYN for 2nd opinion, and due for  pap smear by patient preference   Relevant Orders   Ambulatory referral to Obstetrics / Gynecology   Perimenopausal       Relevant Orders   Ambulatory referral to Obstetrics / Gynecology      Annual Updated Health Maintenance information - 1st mammogram ordered - patient to call to schedule - Colon cancer screening recommended - will notify us for cologuard vs colonoscopy Reviewed recent lab results with patient Encouraged improvement to lifestyle with diet and exercise - Goal of weight loss   Meds ordered this encounter  Medications  . omeprazole (PRILOSEC) 40 MG capsule    Sig: Take 1 capsule (40 mg total) by mouth daily before breakfast.    Dispense:  30 capsule    Refill:  3    Follow up plan: Return in about 4 months (around 12/07/2018) for Elevated A1c, Weight, Back Pain.  Saralyn Pilar, DO [facility] 08/06/2018, 1:24 PM

## 2018-08-06 NOTE — Assessment & Plan Note (Signed)
Suspected underlying etiology of some chronic pain in low back, knees, with DJD. Follow-up in future if not improve - will return

## 2018-08-06 NOTE — Assessment & Plan Note (Signed)
Recent weight gain likely behavioral with mood/smoking and other factors Encourage for now goal is to quit smoking, emphasis on lifestyle diet, improving diet first, as discussed low carb, reduce portions, future can increase exercise as tolerated

## 2018-08-06 NOTE — Assessment & Plan Note (Signed)
Some refractory GERD on intermittent night PPI No other obvious red flag GI symptoms Likely with obesity and diet  Switch to rx Omeprazole 40mg  daily in AM before meal, may add PM antacid Follow-up as planned

## 2018-08-06 NOTE — Assessment & Plan Note (Signed)
Elevated A1c 5.6 but not in range of PreDM Concern with obesity, HLD  Plan:  1. Not on any therapy currently  2. Encourage improved lifestyle - low carb, low sugar diet, reduce portion size, continue improving regular exercise 3. Follow-up 4 months A1c

## 2018-08-06 NOTE — Assessment & Plan Note (Signed)
Recent severe depressive symptoms, chronic problem, recurrent Seems improved now on SNRI with Duloxetine dose increasing and on Trazodone for secondary insomnia and mood Now followed/managed by Psychiatry - [facility] / Therapist - Continue current management per Psych, no changes - See PHQ - severe score, see score 3 for suicidal - however she has no plan or intent, and signed safety contract with me today for reassurance, she is aware who/how to contact if need help

## 2018-08-06 NOTE — Assessment & Plan Note (Signed)
Severe elevated GAD score Now established with Psychiatry CBC - Continue current treatment See A&P Depression

## 2018-08-06 NOTE — Assessment & Plan Note (Signed)
Uncontrolled cholesterol improving lifestyle Last lipid panel 07/2018 Calculated ASCVD 10 yr risk score 3% non smoker but up to 7% as smoker  Plan: 1. Agree to defer Statin therapy, given ASCVD is not at indicated level and primarily due to smoking, will counsel on smoking cessation instead 2. Encourage improved lifestyle - low carb/cholesterol, reduce portion size, continue improving regular exercise

## 2018-08-06 NOTE — Assessment & Plan Note (Signed)
Not focus of visit today Recent prednisone/flexeril limited relief Known long term problem episodic pain In past on gabapentin, NSAID Will return if not improving

## 2018-08-07 DIAGNOSIS — F4321 Adjustment disorder with depressed mood: Secondary | ICD-10-CM | POA: Diagnosis not present

## 2018-08-08 DIAGNOSIS — F4321 Adjustment disorder with depressed mood: Secondary | ICD-10-CM | POA: Diagnosis not present

## 2018-08-11 DIAGNOSIS — F4321 Adjustment disorder with depressed mood: Secondary | ICD-10-CM | POA: Diagnosis not present

## 2018-08-13 DIAGNOSIS — F4321 Adjustment disorder with depressed mood: Secondary | ICD-10-CM | POA: Diagnosis not present

## 2018-08-15 DIAGNOSIS — F4321 Adjustment disorder with depressed mood: Secondary | ICD-10-CM | POA: Diagnosis not present

## 2018-08-16 DIAGNOSIS — F4321 Adjustment disorder with depressed mood: Secondary | ICD-10-CM | POA: Diagnosis not present

## 2018-08-17 DIAGNOSIS — F4321 Adjustment disorder with depressed mood: Secondary | ICD-10-CM | POA: Diagnosis not present

## 2018-08-18 DIAGNOSIS — F4321 Adjustment disorder with depressed mood: Secondary | ICD-10-CM | POA: Diagnosis not present

## 2018-08-20 ENCOUNTER — Ambulatory Visit
Admission: RE | Admit: 2018-08-20 | Discharge: 2018-08-20 | Disposition: A | Discharge status: Home / Self Care | Payer: Medicaid Other | Source: Ambulatory Visit | Attending: Family Medicine | Admitting: Family Medicine

## 2018-08-20 ENCOUNTER — Encounter: Payer: Self-pay | Admitting: Family Medicine

## 2018-08-20 ENCOUNTER — Ambulatory Visit (INDEPENDENT_AMBULATORY_CARE_PROVIDER_SITE_OTHER): Payer: Self-pay | Admitting: Family Medicine

## 2018-08-20 VITALS — BP 152/95 | HR 75 | Temp 98.8°F | Resp 16 | Ht 67.0 in | Wt 230.0 lb

## 2018-08-20 DIAGNOSIS — M150 Primary generalized (osteo)arthritis: Secondary | ICD-10-CM

## 2018-08-20 DIAGNOSIS — M545 Low back pain: Secondary | ICD-10-CM | POA: Diagnosis not present

## 2018-08-20 DIAGNOSIS — G5621 Lesion of ulnar nerve, right upper limb: Secondary | ICD-10-CM | POA: Diagnosis not present

## 2018-08-20 DIAGNOSIS — M5442 Lumbago with sciatica, left side: Secondary | ICD-10-CM

## 2018-08-20 DIAGNOSIS — M542 Cervicalgia: Secondary | ICD-10-CM | POA: Diagnosis not present

## 2018-08-20 DIAGNOSIS — M4722 Other spondylosis with radiculopathy, cervical region: Secondary | ICD-10-CM | POA: Insufficient documentation

## 2018-08-20 DIAGNOSIS — M159 Polyosteoarthritis, unspecified: Secondary | ICD-10-CM

## 2018-08-20 DIAGNOSIS — G8929 Other chronic pain: Secondary | ICD-10-CM

## 2018-08-20 DIAGNOSIS — F4321 Adjustment disorder with depressed mood: Secondary | ICD-10-CM | POA: Diagnosis not present

## 2018-08-20 LAB — POCT URINE PREGNANCY: PREG TEST UR: NEGATIVE

## 2018-08-20 MED ORDER — PREGABALIN 25 MG PO CAPS: 25.0000 mg | ORAL_CAPSULE | Freq: Two times a day (BID) | ORAL | 1 refills | Status: DC

## 2018-08-20 NOTE — Patient Instructions (Addendum)
Thank you for coming to the office today.  Start Lyrica 25mg  capsules daily for few days to week. Then increase to one capsule TWICE a day, after 1-2 weeks can keep increasing dose by one extra 25mg  pill per dose.  -------------------------------------------------  Referral to Spine Specialist for Neck and Low Back - also for Nerve Impingement.  [facility]) / Surgery performed at [facility] [address] [place], [place] [address] Ph - [phone]  May see a [facility] doctor for R arm nerve impingement for nerve test.  [facility] - Neurology Dept [address] [place], [place] [address] Phone: [phone]  X-rays today - this should save them one step, may need physical therapy - in future will need MRI   Please schedule a Follow-up Appointment to: Return in about 4 weeks (around 09/17/2018), or if symptoms worsen or fail to improve, for Back Pain / Neck Pain, sciatica, cervical neuropathy.  If you have any other questions or concerns, please feel free to call the office or send a message through MyChart. You may also schedule an earlier appointment if necessary.  Additionally, you may be receiving a survey about your experience at our office within a few days to 1 week by e-mail or mail. We value your feedback.  Saralyn Pilar, DO [facility], [place]

## 2018-08-20 NOTE — Progress Notes (Signed)
Subjective:    Patient ID: Christy Mayer, female    DOB: 1965-10-29, 52 y.o.   MRN: [id number]  Christy Mayer is a 52 y.o. female presenting on 08/20/2018 for Back Pain (obtw as per patient right side numbness and neck burning onset last week)   HPI   Chronic Pain Back and Neck / OA/DJD Neuropathy / Sciatica Known history of chronic back pain and neck pain problems, more mild in past, has had provoked issues with nerve pain episodes with radiating pain with sciatica. In past few years ago 2016 had fall injury, has had x-ray imaging in past. Has not seen Ortho or Spine Specialist for this. Seen in Urgent Care 04/2018 for same issue given Prednisone and Flexeril, has been on top dose Gabapentin as well with limited, relief, failed NSAIDs - Now here today to discuss these chronic problems further. She states neck has bothered her more recently and has had both her arms fall asleep often usually worse in AM and will have numb and tingling, R>L, now has had persistent numbness of R upper extremity 2 little fingers and part of arm, not whole arm. She tried Trazodone to help sleep 50 to 200mg  without significant relief. No longer on Gabapentin. - Regarding her back pain, describes bilateral low back pain with radiating down into both legs, has flare up worse, R > L. Worse with frequent walking and activity - She states Prednisone did not help her nerve pain as much as she expected previously - Admits some urinary urgency at times, seems quicker now, has less control of bladder when urinating, but she does NOT endorse uncontrollable urinary or bowel incontinence - Denies any night sweats, fevers chills, saddle anesthesia, new trauma or fall or injury   Depression screen [facility] [place] 2/9 08/20/2018 08/06/2018 07/19/2016  Decreased Interest 0 3 1  Down, Depressed, Hopeless 0 3 3  PHQ - 2 Score 0 6 4  Altered sleeping 0 3 3  Tired, decreased energy 0 3 1  Change in appetite 0 3 0  Feeling bad or failure  about yourself  0 3 1  Trouble concentrating 0 3 1  Moving slowly or fidgety/restless 0 1 0  Suicidal thoughts 0 3 0  PHQ-9 Score 0 25 10  Difficult doing work/chores Not difficult at all Very difficult Somewhat difficult    Social History   Tobacco Use  . Smoking status: Current Every Day Smoker    Packs/day: 0.50    Years: 30.00    Pack years: 15.00    Types: Cigarettes  . Smokeless tobacco: Current User  Substance Use Topics  . Alcohol use: Yes    Alcohol/week: 0.0 standard drinks    Comment: occasionally   . Drug use: No    Review of Systems Per HPI unless specifically indicated above     Objective:    BP (!) 152/95   Pulse 75   Temp 98.8 F (37.1 C) (Oral)   Resp 16   Ht 5\' 7"  (1.702 m)   Wt 230 lb (104.3 kg)   LMP 08/11/2014   BMI 36.02 kg/m   Wt Readings from Last 3 Encounters:  08/20/18 230 lb (104.3 kg)  08/06/18 230 lb (104.3 kg)  04/25/18 200 lb (90.7 kg)    Physical Exam  Constitutional: She is oriented to person, place, and time. She appears well-developed and well-nourished. No distress.  Well-appearing, comfortable, cooperative  HENT:  Head: Normocephalic and atraumatic.  Mouth/Throat: Oropharynx is clear and moist.  Eyes: Conjunctivae are normal. Right eye exhibits no discharge. Left eye exhibits no discharge.  Neck:  Neck Inspection: symmetrical, no obvious deformity, some slight loss of lordosis on exam, no erythema or edema Palpation: localized tenderness R paraspinal mid to lower C spine with also reduced sensation to light touch ROM: Mildly reduced ROM including R rotation and L rotation Special Testing: Spurling's Maneuever with reproduced localized pain upon bilateral testing, without radicular radiating pain Strength: distal strength is intact upper ext Neurovascular: reduced sensation to light touch R ulnar nerve distribution hand/wrist, otherwise intact  Cardiovascular: Normal rate.  Pulmonary/Chest: Effort normal.    Musculoskeletal: She exhibits no edema.  Low Back Inspection: Normal appearance, no spinal deformity, symmetrical. Palpation: No tenderness over spinous processes.  Bilateral lumbar paraspinal muscles with some mild tender and hypertonicity/spasm. ROM: REDUCED active ROM forward flex / back extension Special Testing: Seated SLR positive for radicular pain bilaterally with R>L sciatica Strength: Bilateral hip flex/ext 5/5, knee flex/ext 5/5, ankle dorsiflex/plantarflex 5/5 Neurovascular: intact distal sensation to light touch  Neurological: She is alert and oriented to person, place, and time.  Skin: Skin is warm and dry. No rash noted. She is not diaphoretic. No erythema.  Psychiatric: She has a normal mood and affect. Her behavior is normal.  Well groomed, good eye contact, normal speech and thoughts  Nursing note and vitals reviewed.    Old X-ray imaging reviewed from Cervical Spine Neck 09/08/15 - IMPRESSION: shows some reversed lordosis and mild diffuse degenerative changes within C-spine.  Old X-ray imaging reviewed from Lumbar Spine Back 08/25/15 - IMPRESSION: unremarkable without degenerative changes.   Results for orders placed or performed in visit on 08/20/18  POCT urine pregnancy  Result Value Ref Range   Preg Test, Ur Negative Negative      Assessment & Plan:   Problem List Items Addressed This Visit    Chronic left-sided low back pain with left-sided sciatica - Primary   Relevant Medications   pregabalin (LYRICA) 25 MG capsule   Other Relevant Orders   DG Lumbar Spine Complete   Ambulatory referral to Neurosurgery   POCT urine pregnancy (Completed)   Osteoarthritis of spine with radiculopathy, cervical region   Relevant Medications   pregabalin (LYRICA) 25 MG capsule   Other Relevant Orders   DG Cervical Spine Complete   Ambulatory referral to Neurosurgery   Osteoarthritis, multiple sites   Relevant Orders   DG Cervical Spine Complete   DG Lumbar Spine  Complete   Ambulatory referral to Neurosurgery   Ulnar neuropathy of right upper extremity   Relevant Medications   pregabalin (LYRICA) 25 MG capsule   Other Relevant Orders   DG Cervical Spine Complete   Ambulatory referral to Neurosurgery      Clinically concerning joint pain w/ neuropathy associated Neck and Low Back, seems mostly spinal disease consistent with likely progression of degenerative OA/DJD with radicular nerve impingement. Also concern for R ulnar nerve entrapment or other pathology, given distribution on exam, likely elbow or can be higher at neck. - Limited improvement on various therapy in past, limited options remaining  Plan Check X-ray today: Cervical / Lumbar spine - follow-up - will hopefully expedite future imaging, can forward results to [facility] Lyrica 25mg  daily then up to BID dosing, further titration instructions, review benefit risk/side effect, may reach 75 BID or 50 TID in future for now with gradual increase as advised.  Proceed with referral to [facility]surgery - will likely need further  advanced imaging of Neck Cervical Spine and Back Lumbar Spine likely MRI and may need further intervention injection vs surgical options in future, given chronic progressive nature of these complaints  Also may need to see Neurology also through [facility] can check Nerve Conduction Study R Ulnar Nerve  Strict return criteria given or emergent criteria if dramatic worsening spinal problem or spinal cord impingement, per AVS  Meds ordered this encounter  Medications  . pregabalin (LYRICA) 25 MG capsule    Sig: Take 1 capsule (25 mg total) by mouth 2 (two) times daily.    Dispense:  60 capsule    Refill:  1   Orders Placed This Encounter  Procedures  . DG Cervical Spine Complete    Standing Status:   Future    Number of Occurrences:   1    Standing Expiration Date:   10/21/2019    Order Specific Question:   Reason for Exam (SYMPTOM  OR  DIAGNOSIS REQUIRED)    Answer:   degenerative disc disease, worsening with radiculopathy L arm and ulnar neuropathy R arm, neck pain    Order Specific Question:   Is patient pregnant?    Answer:   No    Order Specific Question:   Preferred imaging location?    Answer:   [facility]    Order Specific Question:   Radiology Contrast Protocol - do NOT remove file path    Answer:   \\[email or web address].[email or web address]  . DG Lumbar Spine Complete    Standing Status:   Future    Number of Occurrences:   1    Standing Expiration Date:   10/21/2019    Order Specific Question:   Reason for Exam (SYMPTOM  OR DIAGNOSIS REQUIRED)    Answer:   chronic low back pain with sciatica bilateral, worsening    Order Specific Question:   Is patient pregnant?    Answer:   No    Order Specific Question:   Preferred imaging location?    Answer:   [facility]    Order Specific Question:   Radiology Contrast Protocol - do NOT remove file path    Answer:   \\[email or web address]s.pdf  . Ambulatory referral to Neurosurgery    Referral Priority:   Routine    Referral Type:   Surgical    Referral Reason:   Specialty Services Required    Referred to Provider:   Ivar Drape, PA-C    Requested Specialty:   Neurosurgery    Number of Visits Requested:   1  . POCT urine pregnancy     Follow up plan: Return in about 4 weeks (around 09/17/2018), or if symptoms worsen or fail to improve, for Back Pain / Neck Pain, sciatica, cervical neuropathy.  Saralyn Pilar, DO [facility] 08/20/2018, 1:27 PM

## 2018-08-21 ENCOUNTER — Other Ambulatory Visit (HOSPITAL_COMMUNITY)
Admission: RE | Admit: 2018-08-21 | Discharge: 2018-08-21 | Disposition: A | Discharge status: Home / Self Care | Payer: Medicaid Other | Source: Ambulatory Visit | Attending: Obstetrics and Gynecology | Admitting: Obstetrics and Gynecology

## 2018-08-21 ENCOUNTER — Encounter: Payer: Self-pay | Admitting: Obstetrics and Gynecology

## 2018-08-21 ENCOUNTER — Ambulatory Visit (INDEPENDENT_AMBULATORY_CARE_PROVIDER_SITE_OTHER): Payer: Medicaid Other | Admitting: Obstetrics and Gynecology

## 2018-08-21 VITALS — BP 174/113 | HR 76 | Ht 67.0 in | Wt 229.0 lb

## 2018-08-21 DIAGNOSIS — F4321 Adjustment disorder with depressed mood: Secondary | ICD-10-CM | POA: Diagnosis not present

## 2018-08-21 DIAGNOSIS — N950 Postmenopausal bleeding: Secondary | ICD-10-CM | POA: Insufficient documentation

## 2018-08-21 NOTE — Progress Notes (Signed)
Pt complains of abnormal bleeding and states she is due for a pap smear.

## 2018-08-21 NOTE — Progress Notes (Signed)
HPI:      Christy Mayer is a 52 y.o. [id number] who LMP was Patient's last menstrual period was 08/11/2014.  Subjective:   She presents today complaining of 2 weeks of postmenopausal bleeding.  She states the bleeding began near the end of last month and lasted for 2 weeks.  She states it was similar to a normal periiod.  She states that she has not had menstrual periods in more than 3 years.  She has not had any bleeding since it stopped earlier this month. Of significant note the patient has just begun receiving general medical care again.  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  She also has been noted to have hypertension and is being followed for these things.  She states that she is delinquent in her annual examination and Pap smear and will return for this.    Hx: The following portions of the patient's history were reviewed and updated as appropriate:             She  has a past medical history of Arthritis, Asthma, Back pain, COPD (chronic obstructive pulmonary disease) (HCC), Emphysema lung (HCC), Heart palpitations, Miscarriage, and Mitral valve prolapse. She does not have any pertinent problems on file. She  has a past surgical history that includes Cesarean section (11/2007). Her family history is not on file. She was adopted. She  reports that she has been smoking cigarettes. She has a 15.00 pack-year smoking history. She uses smokeless tobacco. She reports that she drinks alcohol. She reports that she does not use drugs. She has a current medication list which includes the following prescription(s): duloxetine, omeprazole, pregabalin, and trazodone. She has No Known Allergies.       Review of Systems:  Review of Systems  Constitutional: Denied constitutional symptoms, night sweats, recent illness, fatigue, fever, insomnia and weight loss.  Eyes: Denied eye symptoms, eye pain, photophobia, vision change and  visual disturbance.  Ears/Nose/Throat/Neck: Denied ear, nose, throat or neck symptoms, hearing loss, nasal discharge, sinus congestion and sore throat.  Cardiovascular: Denied cardiovascular symptoms, arrhythmia, chest pain/pressure, edema, exercise intolerance, orthopnea and palpitations.  Respiratory: Denied pulmonary symptoms, asthma, pleuritic pain, productive sputum, cough, dyspnea and wheezing.  Gastrointestinal: Denied, gastro-esophageal reflux, melena, nausea and vomiting.  Genitourinary: See HPI for additional information.  Musculoskeletal: Denied musculoskeletal symptoms, stiffness, swelling, muscle weakness and myalgia.  Dermatologic: Denied dermatology symptoms, rash and scar.  Neurologic: Denied neurology symptoms, dizziness, headache, neck pain and syncope.  Psychiatric: Denied psychiatric symptoms, anxiety and depression.  Endocrine: Denied endocrine symptoms including hot flashes and night sweats.   Meds:   Current Outpatient Medications on File Prior to Visit  Medication Sig Dispense Refill  . DULoxetine (CYMBALTA) 30 MG capsule Take 60 mg by mouth daily.  0  . omeprazole (PRILOSEC) 40 MG capsule Take 1 capsule (40 mg total) by mouth daily before breakfast. 30 capsule 3  . pregabalin (LYRICA) 25 MG capsule Take 1 capsule (25 mg total) by mouth 2 (two) times daily. 60 capsule 1  . traZODone (DESYREL) 50 MG tablet Take 50-200 mg by mouth at bedtime as needed.  0   No current facility-administered medications on file prior to visit.     Objective:     Vitals:   08/21/18 1111  BP: (!) 174/113  Pulse: 76              Physical examination   Pelvic:  Vulva: Normal appearance.  No lesions.  Vagina: No lesions or abnormalities noted.  Minimal vaginal atrophy  Support: Normal pelvic support.  Urethra No masses tenderness or scarring.  Meatus Normal size without lesions or prolapse.  Cervix: Normal appearance.  No lesions.  Anus: Normal exam.  No lesions.  Perineum:  Normal exam.  No lesions.        Bimanual   Uterus:  Top normal size.  Non-tender.  Mobile.  AV.  Adnexae: No masses.  Non-tender to palpation.  Cul-de-sac: Negative for abnormality.   Endometrial Biopsy After discussion with the patient regarding her abnormal uterine bleeding I recommended that she proceed with an endometrial biopsy for further diagnosis. The risks, benefits, alternatives, and indications for an endometrial biopsy were discussed with the patient in detail. She understood the risks including infection, bleeding, cervical laceration and uterine perforation.  Verbal consent was obtained.   PROCEDURE NOTE:  Vacurette endometrial biopsy was performed using aseptic technique with iodine preparation.  The uterus was sounded to a length of 10 cm.  Adequate sampling was obtained with minimal blood loss.  The patient tolerated the procedure well.  Disposition will be pending pathology   Assessment:    [id number] Patient Active Problem List   Diagnosis Date Noted  . Ulnar neuropathy of right upper extremity 08/20/2018  . Osteoarthritis of spine with radiculopathy, cervical region 08/20/2018  . Gastroesophageal reflux disease 08/06/2018  . Elevated hemoglobin A1c 07/30/2018  . Mixed hyperlipidemia 07/30/2018  . Osteoarthritis, multiple sites 07/19/2016  . Major depression, recurrent, chronic (HCC) 06/30/2016  . Chronic left-sided low back pain with left-sided sciatica 06/30/2016  . COPD (chronic obstructive pulmonary disease) (HCC) 06/30/2016  . Obesity (BMI 35.0-39.9 without comorbidity) 06/30/2016  . GAD (generalized anxiety disorder) 09/17/2015  . 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 Tobacco use disorder 01/05/2015  . Dyspnea 01/05/2015     1. Postmenopausal bleeding        Plan:            1.  Endometrial biopsy performed.  We will inform her of her results and decide management when they return.  2.  Patient to return for annual examination, Pap  smear, and mammogram as needed.  3.  Strongly advised to return to her primary care physician for continued follow-up of hypertension and her other ongoing medical issues. Orders No orders of the defined types were placed in this encounter.   No orders of the defined types were placed in this encounter.     F/U  No follow-ups on file. I spent 31 minutes involved in the care of this patient of which greater than 50% was spent discussing postmenopausal bleeding, menopausal history, Pap smear history, work-up and testing for postmenopausal bleeding including ultrasound and endometrial biopsy.  Possible outcomes and treatments of each.  All questions answered.  Elonda Husky, M.D. 08/21/2018 11:48 AM

## 2018-08-24 DIAGNOSIS — F4321 Adjustment disorder with depressed mood: Secondary | ICD-10-CM | POA: Diagnosis not present

## 2018-08-29 DIAGNOSIS — F4321 Adjustment disorder with depressed mood: Secondary | ICD-10-CM | POA: Diagnosis not present

## 2018-08-30 DIAGNOSIS — F4321 Adjustment disorder with depressed mood: Secondary | ICD-10-CM | POA: Diagnosis not present

## 2018-09-01 DIAGNOSIS — F4321 Adjustment disorder with depressed mood: Secondary | ICD-10-CM | POA: Diagnosis not present

## 2018-09-04 DIAGNOSIS — F4321 Adjustment disorder with depressed mood: Secondary | ICD-10-CM | POA: Diagnosis not present

## 2018-09-05 ENCOUNTER — Other Ambulatory Visit: Payer: Self-pay | Admitting: Family Medicine

## 2018-09-05 ENCOUNTER — Ambulatory Visit
Admission: RE | Admit: 2018-09-05 | Discharge: 2018-09-05 | Disposition: A | Discharge status: Home / Self Care | Payer: Medicaid Other | Source: Ambulatory Visit | Attending: Family Medicine | Admitting: Family Medicine

## 2018-09-05 DIAGNOSIS — Z1231 Encounter for screening mammogram for malignant neoplasm of breast: Secondary | ICD-10-CM | POA: Diagnosis not present

## 2018-09-05 DIAGNOSIS — Z1239 Encounter for other screening for malignant neoplasm of breast: Secondary | ICD-10-CM | POA: Insufficient documentation

## 2018-09-05 DIAGNOSIS — F4321 Adjustment disorder with depressed mood: Secondary | ICD-10-CM | POA: Diagnosis not present

## 2018-09-07 DIAGNOSIS — F4321 Adjustment disorder with depressed mood: Secondary | ICD-10-CM | POA: Diagnosis not present

## 2018-09-08 DIAGNOSIS — F4321 Adjustment disorder with depressed mood: Secondary | ICD-10-CM | POA: Diagnosis not present

## 2018-09-10 ENCOUNTER — Other Ambulatory Visit: Payer: Self-pay | Admitting: Family Medicine

## 2018-09-10 DIAGNOSIS — R928 Other abnormal and inconclusive findings on diagnostic imaging of breast: Secondary | ICD-10-CM

## 2018-09-10 DIAGNOSIS — N6489 Other specified disorders of breast: Secondary | ICD-10-CM

## 2018-09-11 DIAGNOSIS — F4321 Adjustment disorder with depressed mood: Secondary | ICD-10-CM | POA: Diagnosis not present

## 2018-09-12 DIAGNOSIS — F4321 Adjustment disorder with depressed mood: Secondary | ICD-10-CM | POA: Diagnosis not present

## 2018-09-13 DIAGNOSIS — M5441 Lumbago with sciatica, right side: Secondary | ICD-10-CM | POA: Diagnosis not present

## 2018-09-13 DIAGNOSIS — M5442 Lumbago with sciatica, left side: Secondary | ICD-10-CM | POA: Diagnosis not present

## 2018-09-13 DIAGNOSIS — M545 Low back pain: Secondary | ICD-10-CM | POA: Diagnosis not present

## 2018-09-13 DIAGNOSIS — M542 Cervicalgia: Secondary | ICD-10-CM | POA: Diagnosis not present

## 2018-09-13 DIAGNOSIS — F4321 Adjustment disorder with depressed mood: Secondary | ICD-10-CM | POA: Diagnosis not present

## 2018-09-14 ENCOUNTER — Other Ambulatory Visit: Payer: Self-pay | Admitting: Student

## 2018-09-14 DIAGNOSIS — M5442 Lumbago with sciatica, left side: Principal | ICD-10-CM

## 2018-09-14 DIAGNOSIS — M542 Cervicalgia: Secondary | ICD-10-CM | POA: Diagnosis not present

## 2018-09-14 DIAGNOSIS — G8929 Other chronic pain: Secondary | ICD-10-CM

## 2018-09-14 DIAGNOSIS — M545 Low back pain: Secondary | ICD-10-CM | POA: Diagnosis not present

## 2018-09-14 DIAGNOSIS — M5441 Lumbago with sciatica, right side: Principal | ICD-10-CM

## 2018-09-17 DIAGNOSIS — F4321 Adjustment disorder with depressed mood: Secondary | ICD-10-CM | POA: Diagnosis not present

## 2018-09-18 ENCOUNTER — Encounter: Payer: Self-pay | Admitting: Family Medicine

## 2018-09-18 ENCOUNTER — Ambulatory Visit: Payer: Medicaid Other

## 2018-09-18 ENCOUNTER — Ambulatory Visit (INDEPENDENT_AMBULATORY_CARE_PROVIDER_SITE_OTHER): Payer: Medicaid Other | Admitting: Family Medicine

## 2018-09-18 ENCOUNTER — Other Ambulatory Visit: Payer: Self-pay

## 2018-09-18 VITALS — BP 130/85 | HR 83 | Temp 98.9°F | Resp 16 | Ht 67.0 in | Wt 226.0 lb

## 2018-09-18 DIAGNOSIS — R030 Elevated blood-pressure reading, without diagnosis of hypertension: Secondary | ICD-10-CM | POA: Diagnosis not present

## 2018-09-18 DIAGNOSIS — G5621 Lesion of ulnar nerve, right upper limb: Secondary | ICD-10-CM | POA: Diagnosis not present

## 2018-09-18 DIAGNOSIS — F4321 Adjustment disorder with depressed mood: Secondary | ICD-10-CM | POA: Diagnosis not present

## 2018-09-18 DIAGNOSIS — F339 Major depressive disorder, recurrent, unspecified: Secondary | ICD-10-CM | POA: Diagnosis not present

## 2018-09-18 DIAGNOSIS — M4722 Other spondylosis with radiculopathy, cervical region: Secondary | ICD-10-CM

## 2018-09-18 MED ORDER — PREGABALIN 75 MG PO CAPS: 75.0000 mg | ORAL_CAPSULE | Freq: Two times a day (BID) | ORAL | 2 refills | Status: DC

## 2018-09-18 NOTE — Patient Instructions (Addendum)
Thank you for coming to the office today.  Go ahead and increase Lyrica 25mg  capsules - to now take 2 per dose TWICE a day for next 3 days- then increase to 75mg  capsules 1 pill TWICE a day - new rx sent to pharmacy  Keep me posted if questions or concerns.  Then follow-up as scheduled with Neurosurgery and imaging and injections.  Please schedule a Follow-up Appointment to: Return in about 3 months (around 12/07/2018), or if symptoms worsen or fail to improve, for Follow-up as scheduled.  If you have any other questions or concerns, please feel free to call the office or send a message through MyChart. You may also schedule an earlier appointment if necessary.  Additionally, you may be receiving a survey about your experience at our office within a few days to 1 week by e-mail or mail. We value your feedback.  Saralyn PilarAlexander Bobette Leyh, DO [facility]

## 2018-09-18 NOTE — Progress Notes (Signed)
Subjective:    Patient ID: Christy KiefBrenda M Mayer, female    DOB: 01/05/1966, 52 y.o.   MRN: [id number]  Christy Mayer is a 52 y.o. female presenting on 09/18/2018 for Neck Pain   HPI   Chronic Pain Back and Neck / OA/DJD Neuropathy / Sciatica - Last visit with me 08/2018, for initial visit for same problem, treated with started on Lyrica 25mg  BID and had X-rays done and referred to Neurosurgery, see prior notes for background information. - Interval update with has seen Ivar DrapeAmanda Ferri PA at Neurosurgery, [facility] on 10/11/17, then on 10/15/17 will see Pain Management and proceed with steroid ESI injections neck and low back for 1-2 months - Today patient reports Lyrica is not helping at all. Asks about dose adjustment if possible. Still describes same pain in R upper extremity neck with radiating pain and low back pain - She still has same pain and is looking forward to likely injections for relief - Denies any new injury or problem - Denies any night sweats, fevers chills, saddle anesthesia, new trauma or fall or injury  Follow-up Major Depression (recurrent) / GAD Last visit follow-up 07/2018 for same issue, she is still established with Psychiatry and therapist She is doing well on current medicines Cymbalta, Trazodone. Admits improvement in mood and anxiety update GAD PHQ  Elevated BP, without dx HTN Additionally she reports some recent readings at other doctors office and here were elevated. But no history of HTN. She is not on med for this. Asking about her BP Denies headache, CP, dyspnea, vision change, edema   [facility] screen [facility] 2/9 09/18/2018 08/20/2018 08/06/2018  Decreased Interest 1 0 3  Down, Depressed, Hopeless 1 0 3  PHQ - 2 Score 2 0 6  Altered sleeping 3 0 3  Tired, decreased energy 3 0 3  Change in appetite 3 0 3  Feeling bad or failure about yourself  1 0 3  Trouble concentrating 2 0 3  Moving slowly or fidgety/restless 1 0 1    Suicidal thoughts 0 0 3  PHQ-9 Score 15 0 25  Difficult doing work/chores Somewhat difficult Not difficult at all Very difficult   GAD 7 : Generalized Anxiety Score 09/18/2018 08/06/2018 07/19/2016 06/30/2016  Nervous, Anxious, on Edge 3 3 2 3   Control/stop worrying 1 3 2 3   Worry too much - different things 2 3 2 3   Trouble relaxing 3 3 1 3   Restless 2 3 1 3   Easily annoyed or irritable 1 3 3 3   Afraid - awful might happen 2 3 1 2   Total GAD 7 Score 14 21 12 20   Anxiety Difficulty Somewhat difficult Very difficult Not difficult at all Not difficult at all     Social History   Tobacco Use  . Smoking status: Current Every Day Smoker    Packs/day: 0.50    Years: 30.00    Pack years: 15.00    Types: Cigarettes  . Smokeless tobacco: Current User  Substance Use Topics  . Alcohol use: Yes    Alcohol/week: 0.0 standard drinks    Comment: occasionally   . Drug use: No    Review of Systems Per HPI unless specifically indicated above     Objective:    BP 130/85   Pulse 83   Temp 98.9 F (37.2 C) (Oral)   Resp 16   Ht 5\' 7"  (1.702 m)   Wt 226 lb (102.5 kg)   LMP  08/11/2014   BMI 35.40 kg/m   Wt Readings from Last 3 Encounters:  09/18/18 226 lb (102.5 kg)  08/21/18 229 lb (103.9 kg)  08/20/18 230 lb (104.3 kg)    Physical Exam  Constitutional: She is oriented to person, place, and time. She appears well-developed and well-nourished. No distress.  Well-appearing, comfortable, cooperative  HENT:  Head: Normocephalic and atraumatic.  Mouth/Throat: Oropharynx is clear and moist.  Eyes: Conjunctivae are normal. Right eye exhibits no discharge. Left eye exhibits no discharge.  Cardiovascular: Normal rate.  Pulmonary/Chest: Effort normal.  Musculoskeletal: She exhibits no edema.  Neck  Inspection: symmetrical, no obvious deformity, some slight loss of lordosis on exam, no erythema or edema Palpation: localized tenderness R paraspinal mid to lower C spine with also  reduced sensation to light touch ROM: Mildly reduced ROM including R rotation and L rotation Strength: distal strength is intact upper ext Neurovascular: reduced sensation to light touch R ulnar nerve distribution hand/wrist, otherwise intact   Neurological: She is alert and oriented to person, place, and time.  Skin: Skin is warm and dry. No rash noted. She is not diaphoretic. No erythema.  Psychiatric: She has a normal mood and affect. Her behavior is normal.  Well groomed, good eye contact, normal speech and thoughts  Nursing note and vitals reviewed.      Assessment & Plan:   Problem List Items Addressed This Visit    Elevated BP without diagnosis of hypertension Elevated BP in past, now recent reading is improved, seems to fluctuate Advised likely elevated BP is due to her pain currently  Plan Check BP outside office, write down readings, keep log if consistently >140/90 may return sooner or call and we can consider new rx for HTN possibly Otherwise follow-up in future if pain improved to monitor BP track    Major depression, recurrent, chronic (HCC)    Significant interval improvement for mood/anxiety Continues on SNRI Duloxetine, Trazodone for sleep Followed by Psychiatry      Osteoarthritis of spine with radiculopathy, cervical region   Relevant Medications   pregabalin (LYRICA) 75 MG capsule   Ulnar neuropathy of right upper extremity - Primary   Relevant Medications   pregabalin (LYRICA) 75 MG capsule  Clinically with persistent RUE and Neck / C spine pain / OA/DJD and radicular symptoms Now followed by [facility] has plan for future follow-up MRI imaging, and injection therapy scheduled within 1 month  Plan For now - advised we can adjust med - increase lyrica from 50 mg AM / 25 mg PM - up to 50mg  BID, using existing pills for next few days, then increase to Lyrica 75mg  BID - new rx sent - Continue other management per Neurosurgery/Pain  Follow-up as planned  - if worsening can return to specialist sooner      Meds ordered this encounter  Medications  . pregabalin (LYRICA) 75 MG capsule    Sig: Take 1 capsule (75 mg total) by mouth 2 (two) times daily.    Dispense:  60 capsule    Refill:  2    Follow up plan: Return in about 3 months (around 12/07/2018), or if symptoms worsen or fail to improve, for Follow-up as scheduled.  Saralyn Pilar, DO [facility] 09/18/2018, 11:26 AM

## 2018-09-18 NOTE — Assessment & Plan Note (Signed)
Significant interval improvement for mood/anxiety Continues on SNRI Duloxetine, Trazodone for sleep Followed by Psychiatry

## 2018-09-19 DIAGNOSIS — F4321 Adjustment disorder with depressed mood: Secondary | ICD-10-CM | POA: Diagnosis not present

## 2018-09-25 ENCOUNTER — Ambulatory Visit
Admission: RE | Admit: 2018-09-25 | Discharge: 2018-09-25 | Disposition: A | Discharge status: Home / Self Care | Payer: Medicaid Other | Source: Ambulatory Visit | Attending: Family Medicine | Admitting: Family Medicine

## 2018-09-25 DIAGNOSIS — N6489 Other specified disorders of breast: Secondary | ICD-10-CM

## 2018-09-25 DIAGNOSIS — R928 Other abnormal and inconclusive findings on diagnostic imaging of breast: Secondary | ICD-10-CM

## 2018-09-26 DIAGNOSIS — F4321 Adjustment disorder with depressed mood: Secondary | ICD-10-CM | POA: Diagnosis not present

## 2018-09-28 DIAGNOSIS — F4321 Adjustment disorder with depressed mood: Secondary | ICD-10-CM | POA: Diagnosis not present

## 2018-10-01 DIAGNOSIS — F4321 Adjustment disorder with depressed mood: Secondary | ICD-10-CM | POA: Diagnosis not present

## 2018-10-01 DIAGNOSIS — F341 Dysthymic disorder: Secondary | ICD-10-CM | POA: Diagnosis not present

## 2018-10-03 DIAGNOSIS — F4321 Adjustment disorder with depressed mood: Secondary | ICD-10-CM | POA: Diagnosis not present

## 2018-10-04 DIAGNOSIS — F4321 Adjustment disorder with depressed mood: Secondary | ICD-10-CM | POA: Diagnosis not present

## 2018-10-09 DIAGNOSIS — F341 Dysthymic disorder: Secondary | ICD-10-CM | POA: Diagnosis not present

## 2018-10-10 DIAGNOSIS — F4321 Adjustment disorder with depressed mood: Secondary | ICD-10-CM | POA: Diagnosis not present

## 2018-10-11 ENCOUNTER — Ambulatory Visit
Admission: RE | Admit: 2018-10-11 | Discharge: 2018-10-11 | Disposition: A | Discharge status: Home / Self Care | Payer: Medicaid Other | Source: Ambulatory Visit | Attending: Student | Admitting: Student

## 2018-10-11 DIAGNOSIS — M5441 Lumbago with sciatica, right side: Secondary | ICD-10-CM | POA: Diagnosis not present

## 2018-10-11 DIAGNOSIS — F4321 Adjustment disorder with depressed mood: Secondary | ICD-10-CM | POA: Diagnosis not present

## 2018-10-11 DIAGNOSIS — G8929 Other chronic pain: Secondary | ICD-10-CM | POA: Diagnosis not present

## 2018-10-11 DIAGNOSIS — M5442 Lumbago with sciatica, left side: Secondary | ICD-10-CM | POA: Diagnosis not present

## 2018-10-11 DIAGNOSIS — M5126 Other intervertebral disc displacement, lumbar region: Secondary | ICD-10-CM | POA: Diagnosis not present

## 2018-10-11 DIAGNOSIS — M48061 Spinal stenosis, lumbar region without neurogenic claudication: Secondary | ICD-10-CM | POA: Diagnosis not present

## 2018-10-13 DIAGNOSIS — F4321 Adjustment disorder with depressed mood: Secondary | ICD-10-CM | POA: Diagnosis not present

## 2018-10-16 DIAGNOSIS — F4321 Adjustment disorder with depressed mood: Secondary | ICD-10-CM | POA: Diagnosis not present

## 2018-10-17 DIAGNOSIS — F4321 Adjustment disorder with depressed mood: Secondary | ICD-10-CM | POA: Diagnosis not present

## 2018-10-18 DIAGNOSIS — F4321 Adjustment disorder with depressed mood: Secondary | ICD-10-CM | POA: Diagnosis not present

## 2018-10-22 DIAGNOSIS — F4321 Adjustment disorder with depressed mood: Secondary | ICD-10-CM | POA: Diagnosis not present

## 2018-10-24 DIAGNOSIS — F4321 Adjustment disorder with depressed mood: Secondary | ICD-10-CM | POA: Diagnosis not present

## 2018-10-26 ENCOUNTER — Other Ambulatory Visit: Payer: Self-pay

## 2018-10-26 ENCOUNTER — Emergency Department
Admission: EM | Admit: 2018-10-26 | Discharge: 2018-10-26 | Disposition: A | Discharge status: Home / Self Care | Payer: Medicaid Other | Attending: Emergency Medicine | Admitting: Emergency Medicine

## 2018-10-26 ENCOUNTER — Encounter: Payer: Self-pay | Admitting: Emergency Medicine

## 2018-10-26 ENCOUNTER — Emergency Department: Payer: Medicaid Other

## 2018-10-26 DIAGNOSIS — Y92009 Unspecified place in unspecified non-institutional (private) residence as the place of occurrence of the external cause: Secondary | ICD-10-CM | POA: Insufficient documentation

## 2018-10-26 DIAGNOSIS — J45909 Unspecified asthma, uncomplicated: Secondary | ICD-10-CM | POA: Diagnosis not present

## 2018-10-26 DIAGNOSIS — Y998 Other external cause status: Secondary | ICD-10-CM | POA: Diagnosis not present

## 2018-10-26 DIAGNOSIS — Y9389 Activity, other specified: Secondary | ICD-10-CM | POA: Diagnosis not present

## 2018-10-26 DIAGNOSIS — S61421A Laceration with foreign body of right hand, initial encounter: Secondary | ICD-10-CM | POA: Diagnosis not present

## 2018-10-26 DIAGNOSIS — S61521A Laceration with foreign body of right wrist, initial encounter: Secondary | ICD-10-CM | POA: Diagnosis not present

## 2018-10-26 DIAGNOSIS — S61441A Puncture wound with foreign body of right hand, initial encounter: Secondary | ICD-10-CM | POA: Diagnosis not present

## 2018-10-26 DIAGNOSIS — J449 Chronic obstructive pulmonary disease, unspecified: Secondary | ICD-10-CM | POA: Diagnosis not present

## 2018-10-26 DIAGNOSIS — S61442A Puncture wound with foreign body of left hand, initial encounter: Secondary | ICD-10-CM | POA: Diagnosis not present

## 2018-10-26 DIAGNOSIS — W540XXA Bitten by dog, initial encounter: Secondary | ICD-10-CM | POA: Diagnosis not present

## 2018-10-26 DIAGNOSIS — S61250A Open bite of right index finger without damage to nail, initial encounter: Secondary | ICD-10-CM | POA: Insufficient documentation

## 2018-10-26 DIAGNOSIS — S61451A Open bite of right hand, initial encounter: Secondary | ICD-10-CM | POA: Insufficient documentation

## 2018-10-26 DIAGNOSIS — S61411A Laceration without foreign body of right hand, initial encounter: Secondary | ICD-10-CM

## 2018-10-26 DIAGNOSIS — T148XXA Other injury of unspecified body region, initial encounter: Secondary | ICD-10-CM

## 2018-10-26 MED ORDER — LIDOCAINE HCL (PF) 1 % IJ SOLN
10.0000 mL | Freq: Once | INTRAMUSCULAR | Status: AC
  Administered 2018-10-26: 10 mL via INTRADERMAL
  Filled 2018-10-26: qty 10

## 2018-10-26 MED ORDER — PENTAFLUOROPROP-TETRAFLUOROETH EX AERO: INHALATION_SPRAY | CUTANEOUS | Status: DC | PRN

## 2018-10-26 MED ORDER — AMOXICILLIN-POT CLAVULANATE 875-125 MG PO TABS: 1.0000 | ORAL_TABLET | Freq: Two times a day (BID) | ORAL | 0 refills | Status: DC

## 2018-10-26 MED ORDER — ONDANSETRON HCL 4 MG/2ML IJ SOLN
4.0000 mg | Freq: Once | INTRAMUSCULAR | Status: AC
  Administered 2018-10-26: 4 mg via INTRAVENOUS
  Filled 2018-10-26: qty 2

## 2018-10-26 MED ORDER — MORPHINE SULFATE (PF) 4 MG/ML IV SOLN
4.0000 mg | Freq: Once | INTRAVENOUS | Status: AC
  Administered 2018-10-26: 4 mg via INTRAVENOUS
  Filled 2018-10-26: qty 1

## 2018-10-26 MED ORDER — AMOXICILLIN-POT CLAVULANATE 875-125 MG PO TABS
1.0000 | ORAL_TABLET | Freq: Once | ORAL | Status: AC
  Administered 2018-10-26: 1 via ORAL
  Filled 2018-10-26: qty 1

## 2018-10-26 MED ORDER — LIDOCAINE HCL (PF) 1 % IJ SOLN
5.0000 mL | Freq: Once | INTRAMUSCULAR | Status: AC
  Administered 2018-10-26: 5 mL via INTRADERMAL
  Filled 2018-10-26: qty 5

## 2018-10-26 MED ORDER — CEFAZOLIN SODIUM-DEXTROSE 1-4 GM/50ML-% IV SOLN
1.0000 g | Freq: Once | INTRAVENOUS | Status: AC
  Administered 2018-10-26: 1 g via INTRAVENOUS
  Filled 2018-10-26: qty 50

## 2018-10-26 MED ORDER — BACITRACIN-NEOMYCIN-POLYMYXIN 400-5-5000 EX OINT
TOPICAL_OINTMENT | Freq: Once | CUTANEOUS | Status: AC
  Administered 2018-10-26: 2 via TOPICAL
  Filled 2018-10-26: qty 2

## 2018-10-26 MED ORDER — OXYCODONE-ACETAMINOPHEN 5-325 MG PO TABS
1.0000 | ORAL_TABLET | Freq: Once | ORAL | Status: AC
  Administered 2018-10-26: 1 via ORAL
  Filled 2018-10-26: qty 1

## 2018-10-26 MED ORDER — OXYCODONE-ACETAMINOPHEN 10-325 MG PO TABS: 1.0000 | ORAL_TABLET | Freq: Four times a day (QID) | ORAL | 0 refills | Status: DC | PRN

## 2018-10-26 NOTE — Discharge Instructions (Addendum)
Do not get the sutured area wet for 24 hours. After 24 hours, shower/bathe as usual and pat the area dry. °Change the bandage 2 times per day and apply antibiotic ointment. °Leave open to air when at no risk of getting the area dirty, but cover at night before bed. °See your PCP or go to Urgent Care in 10 days for suture removal or sooner for signs or concern of infection. ° °

## 2018-10-26 NOTE — ED Provider Notes (Signed)
[facility] Emergency Department Provider Note ____________________________________________  Time seen: Approximately 8:35 PM  I have reviewed the triage vital signs and the nursing notes.   HISTORY  Chief Complaint Animal Bite and Laceration    HPI Christy Mayer is a 53 y.o. female who presents to the emergency department for evaluation and treatment of lateral hip pain after breaking up a dog fight.  She states that she leaned forward while sitting in bed and for some reason 2 of her dogs started fighting.  She attempted to pull them away from each other which caused her to fall out of bed and the dogs fell with her.  They continued fighting and she continued trying to break them up.  She now has a puncture wound to the left palm and puncture wounds and lacerations to the right hand.  Tetanus vaccination is up-to-date.  She denies any other complaints.  Past Medical History:  Diagnosis Date  . Arthritis   . Asthma   . Back pain   . COPD (chronic obstructive pulmonary disease) (HCC)   . Emphysema lung (HCC)   . Heart palpitations   . Miscarriage   . Mitral valve prolapse     Patient Active Problem List   Diagnosis Date Noted  . Elevated BP without diagnosis of hypertension 09/18/2018  . Ulnar neuropathy of right upper extremity 08/20/2018  . Osteoarthritis of spine with radiculopathy, cervical region 08/20/2018  . Gastroesophageal reflux disease 08/06/2018  . Elevated hemoglobin A1c 07/30/2018  . Mixed hyperlipidemia 07/30/2018  . Osteoarthritis, multiple sites 07/19/2016  . Major depression, recurrent, chronic (HCC) 06/30/2016  . Chronic left-sided low back pain with left-sided sciatica 06/30/2016  . COPD (chronic obstructive pulmonary disease) (HCC) 06/30/2016  . Obesity (BMI 35.0-39.9 without comorbidity) 06/30/2016  . GAD (generalized anxiety disorder) 09/17/2015  . 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 Tobacco use disorder  01/05/2015  . Dyspnea 01/05/2015    Past Surgical History:  Procedure Laterality Date  . CESAREAN SECTION  11/2007    Prior to Admission medications   Medication Sig Start Date End Date Taking? Authorizing Provider  amoxicillin-clavulanate (AUGMENTIN) 875-125 MG tablet Take 1 tablet by mouth 2 (two) times daily. 10/26/18   Koby Hartfield B, FNP  DULoxetine (CYMBALTA) 30 MG capsule Take 60 mg by mouth daily. 08/02/18   [provider]  omeprazole (PRILOSEC) 40 MG capsule Take 1 capsule (40 mg total) by mouth daily before breakfast. 08/06/18   Karamalegos, Netta Neat, DO  oxyCODONE-acetaminophen (PERCOCET) 10-325 MG tablet Take 1 tablet by mouth every 6 (six) hours as needed for pain. 10/26/18 10/26/19  Basel Defalco, Kasandra Knudsen, FNP  pregabalin (LYRICA) 75 MG capsule Take 1 capsule (75 mg total) by mouth 2 (two) times daily. 09/18/18   Karamalegos, Netta Neat, DO  traZODone (DESYREL) 50 MG tablet Take 50-200 mg by mouth at bedtime as needed. 08/02/18   [provider]    Allergies Aspirin  Family History  Adopted: Yes    Social History Social History   Tobacco Use  . Smoking status: Current Every Day Smoker    Packs/day: 0.50    Years: 30.00    Pack years: 15.00    Types: Cigarettes  . Smokeless tobacco: Current User  Substance Use Topics  . Alcohol use: Yes    Alcohol/week: 0.0 standard drinks    Comment: occasionally   . Drug use: No    Review of Systems Constitutional: Negative for fever. Cardiovascular: Negative for chest pain. Respiratory:  Negative for shortness of breath. Musculoskeletal: Positive for bilateral hand pain.  Skin: Positive for puncture wound to the left hand, puncture wound to the right hand, lacerations to the right hand and wrist Neurological: Negative for decrease in sensation  ____________________________________________   PHYSICAL EXAM:  VITAL SIGNS: ED Triage Vitals  Enc Vitals Group     BP 10/26/18 1929 132/66     Pulse  Rate 10/26/18 1929 85     Resp 10/26/18 1929 20     Temp 10/26/18 1929 98.3 F (36.8 C)     Temp Source 10/26/18 1929 Oral     SpO2 10/26/18 1929 99 %     Weight 10/26/18 1931 208 lb (94.3 kg)     Height 10/26/18 1931 5\' 7"  (1.702 m)     Head Circumference --      Peak Flow --      Pain Score 10/26/18 1930 10     Pain Loc --      Pain Edu? --      Excl. in GC? --     Constitutional: Alert and oriented. Well appearing and in no acute distress. Eyes: Conjunctivae are clear without discharge or drainage Head: Atraumatic Neck: Supple Respiratory: No cough. Respirations are even and unlabored. Musculoskeletal: Full, active range of motion of the left hand with increase in pain while closing fist.  Limited range of motion of the right hand, fingers, and wrist secondary to pain.  Patient is able to demonstrate flexion and extension of all digits although it is limited and she is unable to make a composite fist.  She is able to flex, extend, and supinate the right wrist but complains of pain throughout the motion. Neurologic: Paresthesias generalized over the right hand with dull and sharp sensation intact. Skin: 8 cm gaping laceration to the dorsal aspect of the right hand overlying the webspace and continuing toward the first metacarpal.  Right index finger just below the PIP on the medial aspect with a 1 cm laceration with exposed subcutaneous tissue.  4 cm laceration to the right wrist on the volar aspect that is well approximated and bleeding controlled.  1 cm laceration to the dorsal aspect of the right hand at the base overlying the fifth metacarpal without active bleeding.  Scattered puncture wounds along the dorsal aspect of the right hand. Psychiatric: Affect and behavior are appropriate.  ____________________________________________   LABS (all labs ordered are listed, but only abnormal results are displayed)  Labs Reviewed - No data to  display ____________________________________________  RADIOLOGY  Image of the right hand is negative for acute bony abnormality or retained foreign body per radiology. ____________________________________________   PROCEDURES  .Marland Kitchen.Laceration Repair Date/Time: 10/26/2018 11:14 PM Performed by: Chinita Pesterriplett, Daaiyah Baumert B, FNP Authorized by: Chinita Pesterriplett, Tewana Bohlen B, FNP   Consent:    Consent obtained:  Verbal   Consent given by:  Patient   Risks discussed:  Infection, pain, poor cosmetic result and poor wound healing Anesthesia (see MAR for exact dosages):    Anesthesia method:  Local infiltration   Local anesthetic:  Lidocaine 1% w/o epi Laceration details:    Location:  Hand   Length (cm):  8 Repair type:    Repair type:  Complex Pre-procedure details:    Preparation:  Patient was prepped and draped in usual sterile fashion Exploration:    Hemostasis achieved with:  Direct pressure   Wound exploration: wound explored through full range of motion and entire depth of wound probed and visualized  Wound extent: foreign bodies/material     Foreign bodies/material:  Dog hair, black/brown flecks   Contaminated: yes   Treatment:    Area cleansed with:  Betadine, Hibiclens and saline   Amount of cleaning:  Extensive   Irrigation solution:  Sterile saline   Irrigation volume:  1050   Irrigation method:  Syringe and tap   Visualized foreign bodies/material removed: yes     Debridement:  Minimal Subcutaneous repair:    Suture size:  5-0   Suture material:  Fast-absorbing gut   Suture technique:  Simple interrupted   Number of sutures:  2 Skin repair:    Repair method:  Sutures   Suture size:  4-0   Suture material:  Prolene   Suture technique:  Simple interrupted   Number of sutures:  4 Approximation:    Approximation:  Loose Post-procedure details:    Dressing:  Antibiotic ointment, non-adherent dressing and bulky dressing   Patient tolerance of procedure:  Tolerated well, no immediate  complications .Marland Kitchen.Laceration Repair Date/Time: 10/26/2018 11:19 PM Performed by: Chinita Pesterriplett, Ataya Murdy B, FNP Authorized by: Chinita Pesterriplett, Krishawn Vanderweele B, FNP   Consent:    Consent obtained:  Verbal   Consent given by:  Patient   Risks discussed:  Infection, poor cosmetic result, poor wound healing and pain Anesthesia (see MAR for exact dosages):    Anesthesia method:  Local infiltration   Local anesthetic:  Lidocaine 1% w/o epi Laceration details:    Location:  Finger   Finger location:  R index finger   Length (cm):  1 Repair type:    Repair type:  Intermediate Pre-procedure details:    Preparation:  Patient was prepped and draped in usual sterile fashion Exploration:    Hemostasis achieved with:  Direct pressure   Wound exploration: wound explored through full range of motion and entire depth of wound probed and visualized     Wound extent: foreign bodies/material     Foreign bodies/material:  Dog hair and brown/black flecks   Contaminated: yes   Treatment:    Area cleansed with:  Betadine, Hibiclens and saline   Amount of cleaning:  Extensive   Irrigation solution:  Sterile saline   Irrigation volume:  1000   Irrigation method:  Syringe Skin repair:    Repair method:  Sutures   Suture size:  4-0   Suture material:  Prolene   Suture technique:  Simple interrupted Approximation:    Approximation:  Loose Post-procedure details:    Dressing:  Antibiotic ointment, bulky dressing and non-adherent dressing   Patient tolerance of procedure:  Tolerated well, no immediate complications    ____________________________________________   INITIAL IMPRESSION / ASSESSMENT AND PLAN / ED COURSE  Dyann KiefBrenda M Divita is a 53 y.o. who presents to the emergency department for treatment and evaluation after dog bite. See above for wound care and treatment while here. It was necessary to close 2 of the wounds after copious irrigation to promote healing as they were gaping and subcutaneous tissue was exposed.  She was given IV ancef and po augmentin and will be prescribed augmentin and percocet. She is to follow up with her PCP in 3 days for a wound check. She was strongly advised to return to the ER immediately for any concern of infection if unable to see PCP. She was also advised to return to the ER for onset of numbness, inability to move the fingers. Also, she was given strict instruction to take the antibiotic as prescribed and until finished  and given warning of severe infection if she failed to do so. Wound care was discussed and given in writing on discharge paperwork. All questions were answered and patient discharged home. She was observed ambulating out of the department without assistance.   Medications  pentafluoroprop-tetrafluoroeth (GEBAUERS) aerosol (has no administration in time range)  morphine 4 MG/ML injection 4 mg (4 mg Intravenous Given 10/26/18 2053)  ondansetron (ZOFRAN) injection 4 mg (4 mg Intravenous Given 10/26/18 2053)  ceFAZolin (ANCEF) IVPB 1 g/50 mL premix (0 g Intravenous Stopped 10/26/18 2216)  lidocaine (PF) (XYLOCAINE) 1 % injection 10 mL (10 mLs Intradermal Given 10/26/18 2147)  lidocaine (PF) (XYLOCAINE) 1 % injection 5 mL (5 mLs Intradermal Given 10/26/18 2258)  neomycin-bacitracin-polymyxin (NEOSPORIN) ointment packet (2 application Topical Given 10/26/18 2259)  oxyCODONE-acetaminophen (PERCOCET/ROXICET) 5-325 MG per tablet 1 tablet (1 tablet Oral Given 10/26/18 2258)  amoxicillin-clavulanate (AUGMENTIN) 875-125 MG per tablet 1 tablet (1 tablet Oral Given 10/26/18 2259)    Pertinent labs & imaging results that were available during my care of the patient were reviewed by me and considered in my medical decision making (see chart for details).  _________________________________________   FINAL CLINICAL IMPRESSION(S) / ED DIAGNOSES  Final diagnoses:  Animal bite  Laceration of right hand without foreign body, initial encounter  Puncture wound    ED Discharge  Orders         Ordered    amoxicillin-clavulanate (AUGMENTIN) 875-125 MG tablet  2 times daily     10/26/18 2258    oxyCODONE-acetaminophen (PERCOCET) 10-325 MG tablet  Every 6 hours PRN     10/26/18 2258           If controlled substance prescribed during this visit, 12 month history viewed on the [facility] prior to issuing an initial prescription for Schedule II or III opiod.    Chinita Pester, FNP 10/26/18 2327    Arnaldo Natal, MD 10/27/18 [phone]

## 2018-10-26 NOTE — ED Triage Notes (Signed)
Pt presents to ER from home accompanied by husband, pt reports she was lying on bed with her 4 dogs reports 2 of her dogs started to fight and pt fell from bed and dogs were fighting on top of her, pt has several puncture wounds to right hand and  2 lacerations. Pt talks in complete sentences no distress noted. Pt reports tetanus shot in 2016.

## 2018-10-26 NOTE — ED Notes (Signed)
Patient attempted to break up her dogs while they were fighting and has sustained a few bites to the right hand. Bleeding is controlled at this time. Multiple puncture wounds to right hand, 2-3 on left hand. Dogs are hers and she reports they are up to date on shots.

## 2018-10-29 ENCOUNTER — Telehealth: Payer: Self-pay | Admitting: Family Medicine

## 2018-10-29 NOTE — Telephone Encounter (Signed)
Pt called requesting a refill on oxycodone 10-325 called in due to dog bite on 1/17. Pt call back # is  [phone]

## 2018-10-29 NOTE — Telephone Encounter (Signed)
Attempted to call patient back. Left voicemail for her to return my call.  She was seen and [facility] ED on 10/26/18 with antibiotic augmentin and also pain medicine Oxycodone 10/325 given #20 pills for PRN use.  -------------------------  I do not routinely offer refills for pain medicine given by hospital ED. This is a short supply only. However it is too soon to refill it regardless.  She was asked to follow-up in office to make sure does not develop worsening infection.  She should follow-up for ED Follow-up so that we can evaluate this problem. Still I may not be able to rx the pain medication, but we can discuss her treatment options at that time.  Saralyn PilarAlexander Savas Elvin, DO [facility] 10/29/2018, 3:49 PM

## 2018-10-30 DIAGNOSIS — F4321 Adjustment disorder with depressed mood: Secondary | ICD-10-CM | POA: Diagnosis not present

## 2018-10-30 NOTE — Telephone Encounter (Signed)
Left detail message. 

## 2018-11-01 DIAGNOSIS — F4321 Adjustment disorder with depressed mood: Secondary | ICD-10-CM | POA: Diagnosis not present

## 2018-11-02 DIAGNOSIS — F4321 Adjustment disorder with depressed mood: Secondary | ICD-10-CM | POA: Diagnosis not present

## 2018-11-03 DIAGNOSIS — F4321 Adjustment disorder with depressed mood: Secondary | ICD-10-CM | POA: Diagnosis not present

## 2018-11-05 ENCOUNTER — Ambulatory Visit: Payer: Self-pay | Admitting: Family Medicine

## 2018-11-06 DIAGNOSIS — F4321 Adjustment disorder with depressed mood: Secondary | ICD-10-CM | POA: Diagnosis not present

## 2018-11-07 DIAGNOSIS — F4321 Adjustment disorder with depressed mood: Secondary | ICD-10-CM | POA: Diagnosis not present

## 2018-11-09 DIAGNOSIS — F4321 Adjustment disorder with depressed mood: Secondary | ICD-10-CM | POA: Diagnosis not present

## 2018-11-15 DIAGNOSIS — F4321 Adjustment disorder with depressed mood: Secondary | ICD-10-CM | POA: Diagnosis not present

## 2018-11-17 DIAGNOSIS — F4321 Adjustment disorder with depressed mood: Secondary | ICD-10-CM | POA: Diagnosis not present

## 2018-11-23 DIAGNOSIS — F4321 Adjustment disorder with depressed mood: Secondary | ICD-10-CM | POA: Diagnosis not present

## 2018-11-24 DIAGNOSIS — F4321 Adjustment disorder with depressed mood: Secondary | ICD-10-CM | POA: Diagnosis not present

## 2018-11-27 DIAGNOSIS — F4321 Adjustment disorder with depressed mood: Secondary | ICD-10-CM | POA: Diagnosis not present

## 2018-11-29 DIAGNOSIS — F4321 Adjustment disorder with depressed mood: Secondary | ICD-10-CM | POA: Diagnosis not present

## 2018-12-04 DIAGNOSIS — F4321 Adjustment disorder with depressed mood: Secondary | ICD-10-CM | POA: Diagnosis not present

## 2018-12-07 ENCOUNTER — Ambulatory Visit: Payer: Self-pay | Admitting: Family Medicine

## 2018-12-08 DIAGNOSIS — F4321 Adjustment disorder with depressed mood: Secondary | ICD-10-CM | POA: Diagnosis not present

## 2018-12-12 ENCOUNTER — Emergency Department: Payer: Medicaid Other

## 2018-12-12 ENCOUNTER — Encounter: Payer: Self-pay | Admitting: Emergency Medicine

## 2018-12-12 ENCOUNTER — Emergency Department
Admission: EM | Admit: 2018-12-12 | Discharge: 2018-12-12 | Disposition: A | Discharge status: Home / Self Care | Payer: Medicaid Other | Attending: Emergency Medicine | Admitting: Emergency Medicine

## 2018-12-12 DIAGNOSIS — F17210 Nicotine dependence, cigarettes, uncomplicated: Secondary | ICD-10-CM | POA: Diagnosis not present

## 2018-12-12 DIAGNOSIS — R0602 Shortness of breath: Secondary | ICD-10-CM | POA: Diagnosis not present

## 2018-12-12 DIAGNOSIS — F4321 Adjustment disorder with depressed mood: Secondary | ICD-10-CM | POA: Diagnosis not present

## 2018-12-12 DIAGNOSIS — I10 Essential (primary) hypertension: Secondary | ICD-10-CM | POA: Diagnosis not present

## 2018-12-12 DIAGNOSIS — J449 Chronic obstructive pulmonary disease, unspecified: Secondary | ICD-10-CM | POA: Diagnosis not present

## 2018-12-12 DIAGNOSIS — R079 Chest pain, unspecified: Secondary | ICD-10-CM | POA: Diagnosis not present

## 2018-12-12 DIAGNOSIS — J45909 Unspecified asthma, uncomplicated: Secondary | ICD-10-CM | POA: Diagnosis not present

## 2018-12-12 DIAGNOSIS — R05 Cough: Secondary | ICD-10-CM | POA: Diagnosis not present

## 2018-12-12 DIAGNOSIS — J069 Acute upper respiratory infection, unspecified: Secondary | ICD-10-CM | POA: Diagnosis not present

## 2018-12-12 DIAGNOSIS — R069 Unspecified abnormalities of breathing: Secondary | ICD-10-CM | POA: Diagnosis not present

## 2018-12-12 DIAGNOSIS — R000 Tachycardia, unspecified: Secondary | ICD-10-CM | POA: Diagnosis not present

## 2018-12-12 DIAGNOSIS — Z79899 Other long term (current) drug therapy: Secondary | ICD-10-CM | POA: Diagnosis not present

## 2018-12-12 LAB — CBC WITH DIFFERENTIAL/PLATELET
Abs Immature Granulocytes: 0.03 10*3/uL (ref 0.00–0.07)
Basophils Absolute: 0 10*3/uL (ref 0.0–0.1)
Basophils Relative: 0 %
EOS ABS: 0.1 10*3/uL (ref 0.0–0.5)
Eosinophils Relative: 1 %
HCT: 43 % (ref 36.0–46.0)
Hemoglobin: 14.2 g/dL (ref 12.0–15.0)
IMMATURE GRANULOCYTES: 0 %
Lymphocytes Relative: 27 %
Lymphs Abs: 2.2 10*3/uL (ref 0.7–4.0)
MCH: 32.6 pg (ref 26.0–34.0)
MCHC: 33 g/dL (ref 30.0–36.0)
MCV: 98.9 fL (ref 80.0–100.0)
Monocytes Absolute: 0.6 10*3/uL (ref 0.1–1.0)
Monocytes Relative: 7 %
NEUTROS PCT: 65 %
NRBC: 0 % (ref 0.0–0.2)
Neutro Abs: 5.2 10*3/uL (ref 1.7–7.7)
Platelets: 197 10*3/uL (ref 150–400)
RBC: 4.35 MIL/uL (ref 3.87–5.11)
RDW: 12.5 % (ref 11.5–15.5)
WBC: 8.2 10*3/uL (ref 4.0–10.5)

## 2018-12-12 LAB — BASIC METABOLIC PANEL
ANION GAP: 13 (ref 5–15)
BUN: 20 mg/dL (ref 6–20)
CO2: 24 mmol/L (ref 22–32)
Calcium: 9.1 mg/dL (ref 8.9–10.3)
Chloride: 103 mmol/L (ref 98–111)
Creatinine, Ser: 0.78 mg/dL (ref 0.44–1.00)
GFR calc non Af Amer: >60 mL/min (ref >60)
GLUCOSE: 121 mg/dL — AB (ref 70–99)
Potassium: 3.5 mmol/L (ref 3.5–5.1)
Sodium: 140 mmol/L (ref 135–145)

## 2018-12-12 MED ORDER — BENZONATATE 100 MG PO CAPS: 200.0000 mg | ORAL_CAPSULE | Freq: Four times a day (QID) | ORAL | 0 refills | Status: DC | PRN

## 2018-12-12 MED ORDER — ALBUTEROL SULFATE HFA 108 (90 BASE) MCG/ACT IN AERS: 2.0000 | INHALATION_SPRAY | Freq: Four times a day (QID) | RESPIRATORY_TRACT | 0 refills | Status: DC | PRN

## 2018-12-12 MED ORDER — BENZONATATE 100 MG PO CAPS
200.0000 mg | ORAL_CAPSULE | Freq: Once | ORAL | Status: AC
  Administered 2018-12-12: 200 mg via ORAL
  Filled 2018-12-12: qty 2

## 2018-12-12 MED ORDER — IPRATROPIUM-ALBUTEROL 0.5-2.5 (3) MG/3ML IN SOLN
3.0000 mL | Freq: Once | RESPIRATORY_TRACT | Status: AC
  Administered 2018-12-12: 3 mL via RESPIRATORY_TRACT
  Filled 2018-12-12: qty 3

## 2018-12-12 MED ORDER — PREDNISONE 10 MG (21) PO TBPK: ORAL_TABLET | ORAL | 0 refills | Status: DC

## 2018-12-12 NOTE — ED Notes (Signed)
Topez not working. Pt verbalized understanding of discharge instructions

## 2018-12-12 NOTE — ED Triage Notes (Signed)
Pt in via EMS from home with flu-like sx's for 2 weeks. Productive cough, general bodyaches, 103.3 oral, 99%RA, duoneb administered in route. EMS reports pt is diminished on the right side. Pt with hx of pneumonia.

## 2018-12-12 NOTE — ED Triage Notes (Signed)
Pt reports flu-like sx's for the past 2 weeks worsening over the last 4 days. Pt reports feels like it is hard to breathe when she lays flat.

## 2018-12-12 NOTE — Discharge Instructions (Addendum)
Please seek medical attention for any high fevers, chest pain, shortness of breath, change in behavior, persistent vomiting, bloody stool or any other new or concerning symptoms.  

## 2018-12-12 NOTE — ED Provider Notes (Signed)
[facility] Emergency Department Provider Note   ____________________________________________   I have reviewed the triage vital signs and the nursing notes.   HISTORY  Chief Complaint Influenza; Fever; Cough; and Generalized Body Aches   History limited by: Not Limited   HPI Christy Mayer is a 53 y.o. female who presents to the emergency department today with complaints of upper respiratory illness.  Patient states she has been sick for the past 3 to 4 weeks.  Her symptoms have continued to be positive chest pain, cough, nausea, vomiting, shortness of breath, weakness.  Over the past four days she feels that the symptoms are getting worse.  She has felt progressively worse.  She is felt recently fatigue.  She does have a history of asthma but denies having any inhalers.  Per medical record review patient has a history of asthma, copd.   Past Medical History:  Diagnosis Date  . Arthritis   . Asthma   . Back pain   . COPD (chronic obstructive pulmonary disease) (HCC)   . Emphysema lung (HCC)   . Heart palpitations   . Miscarriage   . Mitral valve prolapse     Patient Active Problem List   Diagnosis Date Noted  . Elevated BP without diagnosis of hypertension 09/18/2018  . Ulnar neuropathy of right upper extremity 08/20/2018  . Osteoarthritis of spine with radiculopathy, cervical region 08/20/2018  . Gastroesophageal reflux disease 08/06/2018  . Elevated hemoglobin A1c 07/30/2018  . Mixed hyperlipidemia 07/30/2018  . Osteoarthritis, multiple sites 07/19/2016  . Major depression, recurrent, chronic (HCC) 06/30/2016  . Chronic left-sided low back pain with left-sided sciatica 06/30/2016  . COPD (chronic obstructive pulmonary disease) (HCC) 06/30/2016  . Obesity (BMI 35.0-39.9 without comorbidity) 06/30/2016  . GAD (generalized anxiety disorder) 09/17/2015  . 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 Tobacco use disorder 01/05/2015  .  Dyspnea 01/05/2015    Past Surgical History:  Procedure Laterality Date  . CESAREAN SECTION  11/2007    Prior to Admission medications   Medication Sig Start Date End Date Taking? Authorizing Provider  amoxicillin-clavulanate (AUGMENTIN) 875-125 MG tablet Take 1 tablet by mouth 2 (two) times daily. 10/26/18   Triplett, Cari B, FNP  DULoxetine (CYMBALTA) 30 MG capsule Take 60 mg by mouth daily. 08/02/18   [provider]  omeprazole (PRILOSEC) 40 MG capsule Take 1 capsule (40 mg total) by mouth daily before breakfast. 08/06/18   Karamalegos, Netta Neat, DO  oxyCODONE-acetaminophen (PERCOCET) 10-325 MG tablet Take 1 tablet by mouth every 6 (six) hours as needed for pain. 10/26/18 10/26/19  Triplett, Kasandra Knudsen, FNP  pregabalin (LYRICA) 75 MG capsule Take 1 capsule (75 mg total) by mouth 2 (two) times daily. 09/18/18   Karamalegos, Netta Neat, DO  traZODone (DESYREL) 50 MG tablet Take 50-200 mg by mouth at bedtime as needed. 08/02/18   [provider]    Allergies Aspirin  Family History  Adopted: Yes    Social History Social History   Tobacco Use  . Smoking status: Current Every Day Smoker    Packs/day: 0.50    Years: 30.00    Pack years: 15.00    Types: Cigarettes  . Smokeless tobacco: Current User  Substance Use Topics  . Alcohol use: Yes    Alcohol/week: 0.0 standard drinks    Comment: occasionally   . Drug use: No    Review of Systems Constitutional: Positive for fever Eyes: No visual changes. ENT: No sore throat. Cardiovascular: Positive chest pain.  Respiratory: Positive shortness of breath. Gastrointestinal: No abdominal pain. Positive for nausea, vomiting and diarrhea.  Genitourinary: Negative for dysuria. Musculoskeletal: Negative for back pain. Skin: Negative for rash. Neurological: Positive for headache.  ____________________________________________   PHYSICAL EXAM:  VITAL SIGNS: ED Triage Vitals  Enc Vitals Group     BP 12/12/18 1542  (!) 171/109     Pulse Rate 12/12/18 1542 (!) 103     Resp 12/12/18 1542 17     Temp 12/12/18 1542 100.3 F (37.9 C)     Temp Source 12/12/18 1542 Oral     SpO2 12/12/18 1542 100 %     Weight 12/12/18 1548 200 lb (90.7 kg)     Height 12/12/18 1548 5\' 7"  (1.702 m)     Head Circumference --      Peak Flow --      Pain Score 12/12/18 1548 10   Constitutional: Alert and oriented.  Eyes: Conjunctivae are normal.  ENT      Head: Normocephalic and atraumatic.      Nose: No congestion/rhinnorhea.      Mouth/Throat: Mucous membranes are moist.      Neck: No stridor. Hematological/Lymphatic/Immunilogical: No cervical lymphadenopathy. Cardiovascular: tachycardic, regular rhythm.  No murmurs, rubs, or gallops.  Respiratory: Diffuse expiratory wheezing.  Gastrointestinal: Soft and non tender. No rebound. No guarding.  Genitourinary: Deferred Musculoskeletal: Normal range of motion in all extremities. No lower extremity edema. Neurologic:  Normal speech and language. No gross focal neurologic deficits are appreciated.  Skin:  Skin is warm, dry and intact. No rash noted. Psychiatric: Mood and affect are normal. Speech and behavior are normal. Patient exhibits appropriate insight and judgment.  ____________________________________________    LABS (pertinent positives/negatives)  CBC wbc 8.2, hgb 14.2, plt 197 BMp wnl except glu 121 ____________________________________________   EKG  None  ____________________________________________    RADIOLOGY  CXR No acute abnormality  ____________________________________________   PROCEDURES  Procedures  ____________________________________________   INITIAL IMPRESSION / ASSESSMENT AND PLAN / ED COURSE  Pertinent labs & imaging results that were available during my care of the patient were reviewed by me and considered in my medical decision making (see chart for details).   Patient presented to the emergency department today  because of concern for continued cough, shortness of breath and other symptoms consistent with a URI. The patient has had these symptoms for weeks but states it is worse in the past 4 days. CXR without pneumonia. Doubt PE. Patient did feel somewhat improved after medication. Will plan on discharging with steroids, cough medication. Patient would be outside of the window for flu treatment so testing was deferred although think possible patient could have influenza. Doubt covid 19 given length of symptoms and no travel or sick contacts.   ____________________________________________   FINAL CLINICAL IMPRESSION(S) / ED DIAGNOSES  Final diagnoses:  Upper respiratory tract infection, unspecified type     Note: This dictation was prepared with Dragon dictation. Any transcriptional errors that result from this process are unintentional     Phineas Semen, MD 12/12/18 [phone]

## 2018-12-12 NOTE — ED Notes (Signed)
Patient transported to X-ray 

## 2018-12-13 DIAGNOSIS — F4321 Adjustment disorder with depressed mood: Secondary | ICD-10-CM | POA: Diagnosis not present

## 2018-12-19 DIAGNOSIS — F4321 Adjustment disorder with depressed mood: Secondary | ICD-10-CM | POA: Diagnosis not present

## 2018-12-20 ENCOUNTER — Ambulatory Visit: Payer: Self-pay | Admitting: Family Medicine

## 2018-12-20 DIAGNOSIS — F4321 Adjustment disorder with depressed mood: Secondary | ICD-10-CM | POA: Diagnosis not present

## 2018-12-26 DIAGNOSIS — F4321 Adjustment disorder with depressed mood: Secondary | ICD-10-CM | POA: Diagnosis not present

## 2018-12-31 DIAGNOSIS — F4321 Adjustment disorder with depressed mood: Secondary | ICD-10-CM | POA: Diagnosis not present

## 2019-01-07 DIAGNOSIS — F4321 Adjustment disorder with depressed mood: Secondary | ICD-10-CM | POA: Diagnosis not present

## 2019-01-15 DIAGNOSIS — F4321 Adjustment disorder with depressed mood: Secondary | ICD-10-CM | POA: Diagnosis not present

## 2019-01-18 ENCOUNTER — Emergency Department
Admission: EM | Admit: 2019-01-18 | Discharge: 2019-01-18 | Disposition: A | Discharge status: Home / Self Care | Payer: Medicaid Other | Attending: Emergency Medicine | Admitting: Emergency Medicine

## 2019-01-18 ENCOUNTER — Other Ambulatory Visit: Payer: Self-pay

## 2019-01-18 ENCOUNTER — Emergency Department: Payer: Medicaid Other

## 2019-01-18 DIAGNOSIS — R519 Headache, unspecified: Secondary | ICD-10-CM

## 2019-01-18 DIAGNOSIS — R51 Headache: Secondary | ICD-10-CM | POA: Diagnosis not present

## 2019-01-18 DIAGNOSIS — F17210 Nicotine dependence, cigarettes, uncomplicated: Secondary | ICD-10-CM | POA: Diagnosis not present

## 2019-01-18 DIAGNOSIS — J449 Chronic obstructive pulmonary disease, unspecified: Secondary | ICD-10-CM | POA: Insufficient documentation

## 2019-01-18 DIAGNOSIS — Z79899 Other long term (current) drug therapy: Secondary | ICD-10-CM | POA: Diagnosis not present

## 2019-01-18 LAB — BASIC METABOLIC PANEL
Anion gap: 12 (ref 5–15)
BUN: 17 mg/dL (ref 6–20)
CO2: 24 mmol/L (ref 22–32)
Calcium: 9.2 mg/dL (ref 8.9–10.3)
Chloride: 103 mmol/L (ref 98–111)
Creatinine, Ser: 0.76 mg/dL (ref 0.44–1.00)
GFR calc Af Amer: >60 mL/min (ref >60)
GFR calc non Af Amer: >60 mL/min (ref >60)
Glucose, Bld: 122 mg/dL — ABNORMAL HIGH (ref 70–99)
Potassium: 3.5 mmol/L (ref 3.5–5.1)
Sodium: 139 mmol/L (ref 135–145)

## 2019-01-18 LAB — CBC
HCT: 43 % (ref 36.0–46.0)
Hemoglobin: 14.5 g/dL (ref 12.0–15.0)
MCH: 32.5 pg (ref 26.0–34.0)
MCHC: 33.7 g/dL (ref 30.0–36.0)
MCV: 96.4 fL (ref 80.0–100.0)
Platelets: 179 10*3/uL (ref 150–400)
RBC: 4.46 MIL/uL (ref 3.87–5.11)
RDW: 12.3 % (ref 11.5–15.5)
WBC: 8.2 10*3/uL (ref 4.0–10.5)
nRBC: 0 % (ref 0.0–0.2)

## 2019-01-18 MED ORDER — SODIUM CHLORIDE 0.9 % IV BOLUS
1000.0000 mL | Freq: Once | INTRAVENOUS | Status: AC
  Administered 2019-01-18: 21:00:00 1000 mL via INTRAVENOUS

## 2019-01-18 MED ORDER — DIPHENHYDRAMINE HCL 50 MG/ML IJ SOLN
50.0000 mg | Freq: Once | INTRAMUSCULAR | Status: AC
  Administered 2019-01-18: 50 mg via INTRAVENOUS
  Filled 2019-01-18: qty 1

## 2019-01-18 MED ORDER — IOHEXOL 350 MG/ML SOLN
75.0000 mL | Freq: Once | INTRAVENOUS | Status: AC | PRN
  Administered 2019-01-18: 75 mL via INTRAVENOUS

## 2019-01-18 MED ORDER — MORPHINE SULFATE (PF) 4 MG/ML IV SOLN
4.0000 mg | Freq: Once | INTRAVENOUS | Status: AC
  Administered 2019-01-18: 4 mg via INTRAVENOUS
  Filled 2019-01-18: qty 1

## 2019-01-18 MED ORDER — KETOROLAC TROMETHAMINE 30 MG/ML IJ SOLN
30.0000 mg | Freq: Once | INTRAMUSCULAR | Status: AC
  Administered 2019-01-18: 30 mg via INTRAVENOUS
  Filled 2019-01-18: qty 1

## 2019-01-18 MED ORDER — METOCLOPRAMIDE HCL 5 MG/ML IJ SOLN
10.0000 mg | Freq: Once | INTRAMUSCULAR | Status: AC
  Administered 2019-01-18: 10 mg via INTRAVENOUS
  Filled 2019-01-18: qty 2

## 2019-01-18 NOTE — ED Triage Notes (Signed)
Headache that began 5 mins before she called the ambulance. Patient states bent over to tie her shoes and sudden onset of pain to back of her head, pain now to front of head and top of her head. Reports nausea. Denies vision issues.

## 2019-01-18 NOTE — ED Notes (Signed)
Patient returned from CT awaiting results and further plan of care.  

## 2019-01-18 NOTE — ED Provider Notes (Signed)
[facility] Emergency Department Provider Note  Time seen: 8:14 PM  I have reviewed the triage vital signs and the nursing notes.   HISTORY  Chief Complaint Headache   HPI Christy Mayer is a [age] y.o. female with a past medical history of asthma, arthritis, COPD, back pain, presents to the emergency department for acute onset of a headache.  According to the patient approximate 1 hour ago she was bending over to tie her shoe when she stood up she felt somewhat lightheaded and then had a progressively worsening headache.  Patient states it is 10/10 in severity.  Patient denies any history of migraines although states that headache is much worse with any type of light or loud noise.  States nausea but denies any vomiting.  Denies any weakness or numbness confusion or slurred speech.  No fever cough congestion or recent travel.   Past Medical History:  Diagnosis Date  . Arthritis   . Asthma   . Back pain   . COPD (chronic obstructive pulmonary disease) (HCC)   . Emphysema lung (HCC)   . Heart palpitations   . Miscarriage   . Mitral valve prolapse     Patient Active Problem List   Diagnosis Date Noted  . Elevated BP without diagnosis of hypertension 09/18/2018  . Ulnar neuropathy of right upper extremity 08/20/2018  . Osteoarthritis of spine with radiculopathy, cervical region 08/20/2018  . Gastroesophageal reflux disease 08/06/2018  . Elevated hemoglobin A1c 07/30/2018  . Mixed hyperlipidemia 07/30/2018  . Osteoarthritis, multiple sites 07/19/2016  . Major depression, recurrent, chronic (HCC) 06/30/2016  . Chronic left-sided low back pain with left-sided sciatica 06/30/2016  . COPD (chronic obstructive pulmonary disease) (HCC) 06/30/2016  . Obesity (BMI 35.0-39.9 without comorbidity) 06/30/2016  . GAD (generalized anxiety disorder) 09/17/2015  . 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 Tobacco use disorder 01/05/2015  . Dyspnea 01/05/2015     Past Surgical History:  Procedure Laterality Date  . CESAREAN SECTION  11/2007    Prior to Admission medications   Medication Sig Start Date End Date Taking? Authorizing Provider  buPROPion (WELLBUTRIN XL) 150 MG 24 hr tablet Take 1 tablet by mouth daily. 12/27/18  Yes [provider]  cloNIDine HCl (KAPVAY) 0.1 MG TB12 ER tablet Take 1 tablet by mouth daily. 12/27/18  Yes [provider]  DULoxetine (CYMBALTA) 60 MG capsule Take 1 capsule by mouth 2 (two) times daily. 12/27/18  Yes [provider]  traZODone (DESYREL) 50 MG tablet Take 50-200 mg by mouth at bedtime as needed. 08/02/18  Yes [provider]  albuterol (PROVENTIL HFA;VENTOLIN HFA) 108 (90 Base) MCG/ACT inhaler Inhale 2 puffs into the lungs every 6 (six) hours as needed for wheezing or shortness of breath. 12/12/18   Phineas SemenGoodman, Graydon, MD  amoxicillin-clavulanate (AUGMENTIN) 875-125 MG tablet Take 1 tablet by mouth 2 (two) times daily. Patient not taking: Reported on 01/18/2019 10/26/18   Kem Boroughsriplett, Cari B, FNP  benzonatate (TESSALON PERLES) 100 MG capsule Take 2 capsules (200 mg total) by mouth every 6 (six) hours as needed for cough. Patient not taking: Reported on 01/18/2019 12/12/18 12/12/19  Phineas SemenGoodman, Graydon, MD  omeprazole (PRILOSEC) 40 MG capsule Take 1 capsule (40 mg total) by mouth daily before breakfast. Patient not taking: Reported on 01/18/2019 08/06/18   Smitty CordsKaramalegos, Alexander J, DO  oxyCODONE-acetaminophen (PERCOCET) 10-325 MG tablet Take 1 tablet by mouth every 6 (six) hours as needed for pain. Patient not taking: Reported on 01/18/2019 10/26/18 10/26/19  Triplett,  Cari B, FNP  predniSONE (STERAPRED UNI-PAK 21 TAB) 10 MG (21) TBPK tablet Take per packaging instructions Patient not taking: Reported on 01/18/2019 12/12/18   Phineas Semen, MD  pregabalin (LYRICA) 75 MG capsule Take 1 capsule (75 mg total) by mouth 2 (two) times daily. Patient not taking: Reported on 01/18/2019 09/18/18    Smitty Cords, DO    Allergies  Allergen Reactions  . Aspirin Nausea Only    Family History  Adopted: Yes    Social History Social History   Tobacco Use  . Smoking status: Current Every Day Smoker    Packs/day: 0.50    Years: 30.00    Pack years: 15.00    Types: Cigarettes  . Smokeless tobacco: Current User  Substance Use Topics  . Alcohol use: Yes    Alcohol/week: 0.0 standard drinks    Comment: occasionally   . Drug use: No    Review of Systems Constitutional: Negative for fever. Eyes: Positive for photophobia ENT: Positive for facial pain Cardiovascular: Negative for chest pain. Respiratory: Negative for shortness of breath.  Negative for cough  gastrointestinal: Negative for abdominal pain, vomiting.  Positive for nausea. Musculoskeletal: Negative for musculoskeletal complaints Neurological: 10/10 aching headache. All other ROS negative  ____________________________________________   PHYSICAL EXAM:  VITAL SIGNS: ED Triage Vitals  Enc Vitals Group     BP 01/18/19 1955 (!) 171/100     Pulse Rate 01/18/19 1954 (!) 50     Resp 01/18/19 1954 18     Temp 01/18/19 1954 98.4 F (36.9 C)     Temp Source 01/18/19 1954 Oral     SpO2 01/18/19 1954 96 %     Weight 01/18/19 1955 200 lb (90.7 kg)     Height 01/18/19 1955 5\' 7"  (1.702 m)     Head Circumference --      Peak Flow --      Pain Score 01/18/19 1955 0     Pain Loc --      Pain Edu? --      Excl. in GC? --     Constitutional: Alert and oriented. Well appearing and in no distress. Eyes: Normal exam ENT      Head: Normocephalic and atraumatic      Mouth/Throat: Mucous membranes are moist. Cardiovascular: Normal rate, regular rhythm. No murmur Respiratory: Normal respiratory effort without tachypnea nor retractions. Breath sounds are clear  Gastrointestinal: Soft and nontender. No distention.  Musculoskeletal: Nontender with normal range of motion in all extremities. Neurologic:  Normal  speech and language. No gross focal neurologic deficits Skin:  Skin is warm, dry and intact.  Psychiatric: Mood and affect are normal.   ____________________________________________    EKG  EKG viewed and interpreted by myself shows a sinus rhythm at 50 bpm with a narrow QRS, normal axis, normal intervals, nonspecific but no concerning ST changes.  ____________________________________________    RADIOLOGY  CT scan head is negative  CT angiography of the head is negative.  ____________________________________________   INITIAL IMPRESSION / ASSESSMENT AND PLAN / ED COURSE  Pertinent labs & imaging results that were available during my care of the patient were reviewed by me and considered in my medical decision making (see chart for details).   Patient presents emergency department acute onset of headache starting approximately 1 hour prior to arrival.  Patient states he has had headaches before but never to this severity.  Differential would include headache, migraine headache, ICH, subarachnoid.  We will obtain CT imaging  of the head, check basic labs and continue to closely monitor.  Patient agreeable to plan of care.  CT scan of the head as well as CT angiography of the head is negative.  Patient appears much improved, states her headache is down to a 7/10 from a 10/10.  Patient was sleeping comfortably upon my reevaluation.  We will treat with additional pain medication and continue to closely monitor.  Patient agreeable plan of care.  Christy Mayer was evaluated in Emergency Department on 01/18/2019 for the symptoms described in the history of present illness. She was evaluated in the context of the global COVID-19 pandemic, which necessitated consideration that the patient might be at risk for infection with the SARS-CoV-2 virus that causes COVID-19. Institutional protocols and algorithms that pertain to the evaluation of patients at risk for COVID-19 are in a state of rapid  change based on information released by regulatory bodies including the CDC and federal and state organizations. These policies and algorithms were followed during the patient's care in the ED.  ____________________________________________   FINAL CLINICAL IMPRESSION(S) / ED DIAGNOSES  Headache   Minna Antis, MD 01/18/19 2225

## 2019-01-18 NOTE — ED Notes (Signed)
Pt talking to husband on the phone at this time. NAD noted.

## 2019-01-18 NOTE — ED Notes (Signed)
Patient of unit to ct head.

## 2019-01-21 DIAGNOSIS — F4321 Adjustment disorder with depressed mood: Secondary | ICD-10-CM | POA: Diagnosis not present

## 2019-02-06 DIAGNOSIS — F4321 Adjustment disorder with depressed mood: Secondary | ICD-10-CM | POA: Diagnosis not present

## 2019-02-14 DIAGNOSIS — F4321 Adjustment disorder with depressed mood: Secondary | ICD-10-CM | POA: Diagnosis not present

## 2019-02-20 DIAGNOSIS — F4321 Adjustment disorder with depressed mood: Secondary | ICD-10-CM | POA: Diagnosis not present

## 2019-02-26 DIAGNOSIS — F4321 Adjustment disorder with depressed mood: Secondary | ICD-10-CM | POA: Diagnosis not present

## 2019-03-07 ENCOUNTER — Other Ambulatory Visit: Payer: Self-pay | Admitting: Family Medicine

## 2019-03-07 ENCOUNTER — Ambulatory Visit (INDEPENDENT_AMBULATORY_CARE_PROVIDER_SITE_OTHER): Payer: Medicaid Other | Admitting: Family Medicine

## 2019-03-07 ENCOUNTER — Other Ambulatory Visit: Payer: Self-pay

## 2019-03-07 ENCOUNTER — Encounter: Payer: Self-pay | Admitting: Family Medicine

## 2019-03-07 DIAGNOSIS — R112 Nausea with vomiting, unspecified: Secondary | ICD-10-CM | POA: Diagnosis not present

## 2019-03-07 DIAGNOSIS — R030 Elevated blood-pressure reading, without diagnosis of hypertension: Secondary | ICD-10-CM

## 2019-03-07 DIAGNOSIS — J432 Centrilobular emphysema: Secondary | ICD-10-CM

## 2019-03-07 DIAGNOSIS — R0982 Postnasal drip: Secondary | ICD-10-CM | POA: Diagnosis not present

## 2019-03-07 DIAGNOSIS — R197 Diarrhea, unspecified: Secondary | ICD-10-CM | POA: Diagnosis not present

## 2019-03-07 DIAGNOSIS — F4321 Adjustment disorder with depressed mood: Secondary | ICD-10-CM | POA: Diagnosis not present

## 2019-03-07 MED ORDER — ALBUTEROL SULFATE HFA 108 (90 BASE) MCG/ACT IN AERS: 2.0000 | INHALATION_SPRAY | Freq: Four times a day (QID) | RESPIRATORY_TRACT | 0 refills | Status: DC | PRN

## 2019-03-07 MED ORDER — LISINOPRIL 5 MG PO TABS: 5.0000 mg | ORAL_TABLET | Freq: Every day | ORAL | 1 refills | Status: DC

## 2019-03-07 MED ORDER — ONDANSETRON 4 MG PO TBDP: 4.0000 mg | ORAL_TABLET | Freq: Three times a day (TID) | ORAL | 0 refills | Status: DC | PRN

## 2019-03-07 MED ORDER — FLUTICASONE PROPIONATE 50 MCG/ACT NA SUSP: 2.0000 | Freq: Every day | NASAL | 3 refills | Status: DC

## 2019-03-07 NOTE — Progress Notes (Signed)
Virtual Visit via Telephone The purpose of this virtual visit is to provide medical care while limiting exposure to the novel coronavirus (COVID19) for both patient and office staff.  Consent was obtained for phone visit:  Yes.   Answered questions that patient had about telehealth interaction:  Yes.   I discussed the limitations, risks, security and privacy concerns of performing an evaluation and management service by telephone. I also discussed with the patient that there may be a patient responsible charge related to this service. The patient expressed understanding and agreed to proceed.  Patient Location: Home Provider Location: [facility])  ---------------------------------------------------------------------- Chief Complaint  Patient presents with  . Cough    onset 01/20  . Diarrhea    onset 3 days  . Emesis    onset 5 days only in afternoon    S: Reviewed CMA documentation. I have called patient and gathered additional HPI as follows:  Diarrhea / Nausea Vomiting Reports that symptoms started 5 days ago with nausea and vomiting usually in afternoon only, seems fairly predictable, not linked to meals usually, has a few episodes then it usually improves. - Admits 2-3 times watery diarrhea most days now, not linked to meals Admits chronic reflux GERD, not having any flare up lately Admits some abdominal pain when having vomiting, only not having symptoms otherwise Admits some soreness in muscles now from some vomiting episodes Admits 3-4 days ago woke up in a pool of sweat  Centrilobular Emphysema / Coughing, chronic Reports cough since January 2020, describes a persistent mild cough. Non productive, not worsening. Has history of COPD with emphysema, thinks this may contribute, needs refill on inhaler, not taking maintenance therapy.  Patient is currently at home, occasionally goes out to store for supplies Denies any high risk travel to areas of  current concern for COVID19. Denies any known or suspected exposure to person with or possibly with COVID19.  Denies any fevers, chills, sweats, body ache, cough, shortness of breath, sinus pain or pressure, headache, abdominal pain, diarrhea  Past Medical History:  Diagnosis Date  . Arthritis   . Asthma   . Back pain   . COPD (chronic obstructive pulmonary disease) (HCC)   . Emphysema lung (HCC)   . Heart palpitations   . Miscarriage   . Mitral valve prolapse    Social History   Tobacco Use  . Smoking status: Current Every Day Smoker    Packs/day: 0.50    Years: 30.00    Pack years: 15.00    Types: Cigarettes  . Smokeless tobacco: Current User  Substance Use Topics  . Alcohol use: Yes    Alcohol/week: 0.0 standard drinks    Comment: occasionally   . Drug use: No    Current Outpatient Medications:  .  albuterol (VENTOLIN HFA) 108 (90 Base) MCG/ACT inhaler, Inhale 2 puffs into the lungs every 6 (six) hours as needed for wheezing or shortness of breath., Disp: 1 Inhaler, Rfl: 0 .  buPROPion (WELLBUTRIN XL) 150 MG 24 hr tablet, Take 1 tablet by mouth daily., Disp: , Rfl:  .  cloNIDine HCl (KAPVAY) 0.1 MG TB12 ER tablet, Take 1 tablet by mouth daily., Disp: , Rfl:  .  DULoxetine (CYMBALTA) 60 MG capsule, Take 1 capsule by mouth 2 (two) times daily., Disp: , Rfl:  .  omeprazole (PRILOSEC) 40 MG capsule, Take 1 capsule (40 mg total) by mouth daily before breakfast., Disp: 30 capsule, Rfl: 3 .  traZODone (DESYREL) 50 MG tablet,  Take 50-200 mg by mouth at bedtime as needed., Disp: , Rfl: 0 .  fluticasone (FLONASE) 50 MCG/ACT nasal spray, Place 2 sprays into both nostrils daily. Use for 4-6 weeks then stop and use seasonally or as needed., Disp: 16 g, Rfl: 3 .  lisinopril (ZESTRIL) 5 MG tablet, Take 1 tablet (5 mg total) by mouth daily., Disp: 90 tablet, Rfl: 1 .  ondansetron (ZOFRAN ODT) 4 MG disintegrating tablet, Take 1 tablet (4 mg total) by mouth every 8 (eight) hours as needed  for nausea or vomiting., Disp: 30 tablet, Rfl: 0 .  pregabalin (LYRICA) 75 MG capsule, Take 1 capsule (75 mg total) by mouth 2 (two) times daily. (Patient not taking: Reported on 01/18/2019), Disp: 60 capsule, Rfl: 2  Depression screen [facility] 2/9 03/07/2019 09/18/2018 08/20/2018  Decreased Interest 0 1 0  Down, Depressed, Hopeless 0 1 0  PHQ - 2 Score 0 2 0  Altered sleeping - 3 0  Tired, decreased energy - 3 0  Change in appetite - 3 0  Feeling bad or failure about yourself  - 1 0  Trouble concentrating - 2 0  Moving slowly or fidgety/restless - 1 0  Suicidal thoughts - 0 0  PHQ-9 Score - 15 0  Difficult doing work/chores - Somewhat difficult Not difficult at all    GAD 7 : Generalized Anxiety Score 09/18/2018 08/06/2018 07/19/2016 06/30/2016  Nervous, Anxious, on Edge 3 3 2 3   Control/stop worrying 1 3 2 3   Worry too much - different things 2 3 2 3   Trouble relaxing 3 3 1 3   Restless 2 3 1 3   Easily annoyed or irritable 1 3 3 3   Afraid - awful might happen 2 3 1 2   Total GAD 7 Score 14 21 12 20   Anxiety Difficulty Somewhat difficult Very difficult Not difficult at all Not difficult at all    -------------------------------------------------------------------------- O: No physical exam performed due to remote telephone encounter.   Recent Results (from the past 2160 hour(s))  CBC with Differential     Status: None   Collection Time: 12/12/18  5:50 PM  Result Value Ref Range   WBC 8.2 4.0 - 10.5 K/uL   RBC 4.35 3.87 - 5.11 MIL/uL   Hemoglobin 14.2 12.0 - 15.0 g/dL   HCT 81.1 03.1 - 59.4 %   MCV 98.9 80.0 - 100.0 fL   MCH 32.6 26.0 - 34.0 pg   MCHC 33.0 30.0 - 36.0 g/dL   RDW 58.5 92.9 - 24.4 %   Platelets 197 150 - 400 K/uL   nRBC 0.0 0.0 - 0.2 %   Neutrophils Relative % 65 %   Neutro Abs 5.2 1.7 - 7.7 K/uL   Lymphocytes Relative 27 %   Lymphs Abs 2.2 0.7 - 4.0 K/uL   Monocytes Relative 7 %   Monocytes Absolute 0.6 0.1 - 1.0 K/uL   Eosinophils Relative 1 %    Eosinophils Absolute 0.1 0.0 - 0.5 K/uL   Basophils Relative 0 %   Basophils Absolute 0.0 0.0 - 0.1 K/uL   Immature Granulocytes 0 %   Abs Immature Granulocytes 0.03 0.00 - 0.07 K/uL    Comment: Performed at [facility], [address] [place], [place] [address]  Basic metabolic panel     Status: Abnormal   Collection Time: 12/12/18  5:50 PM  Result Value Ref Range   Sodium 140 135 - 145 mmol/L   Potassium 3.5 3.5 - 5.1 mmol/L   Chloride 103  98 - 111 mmol/L   CO2 24 22 - 32 mmol/L   Glucose, Bld 121 (H) 70 - 99 mg/dL   BUN 20 6 - 20 mg/dL   Creatinine, Ser 4.03 0.44 - 1.00 mg/dL   Calcium 9.1 8.9 - 47.4 mg/dL   GFR calc non Af Amer >60 >60 mL/min   GFR calc Af Amer >60 >60 mL/min   Anion gap 13 5 - 15    Comment: Performed at [facility], [address] [place], [place] [address]  CBC     Status: None   Collection Time: 01/18/19  8:19 PM  Result Value Ref Range   WBC 8.2 4.0 - 10.5 K/uL   RBC 4.46 3.87 - 5.11 MIL/uL   Hemoglobin 14.5 12.0 - 15.0 g/dL   HCT 38.7 56.4 - 33.2 %   MCV 96.4 80.0 - 100.0 fL   MCH 32.5 26.0 - 34.0 pg   MCHC 33.7 30.0 - 36.0 g/dL   RDW 95.1 88.4 - 16.6 %   Platelets 179 150 - 400 K/uL   nRBC 0.0 0.0 - 0.2 %    Comment: Performed at [facility], [address] [place], [place] [address]  Basic metabolic panel     Status: Abnormal   Collection Time: 01/18/19  8:19 PM  Result Value Ref Range   Sodium 139 135 - 145 mmol/L   Potassium 3.5 3.5 - 5.1 mmol/L   Chloride 103 98 - 111 mmol/L   CO2 24 22 - 32 mmol/L   Glucose, Bld 122 (H) 70 - 99 mg/dL   BUN 17 6 - 20 mg/dL   Creatinine, Ser 6.01 0.44 - 1.00 mg/dL   Calcium 9.2 8.9 - 09.3 mg/dL   GFR calc non Af Amer >60 >60 mL/min   GFR calc Af Amer >60 >60 mL/min   Anion gap 12 5 - 15    Comment: Performed at [facility], [address] [place], [place] [address]     -------------------------------------------------------------------------- A&P:  Problem List Items Addressed This Visit    Centrilobular emphysema (HCC)   Relevant Medications   albuterol (VENTOLIN HFA) 108 (90 Base) MCG/ACT inhaler   fluticasone (FLONASE) 50 MCG/ACT nasal spray    Other Visit Diagnoses    Intractable vomiting with nausea, unspecified vomiting type    -  Primary   Relevant Medications   ondansetron (ZOFRAN ODT) 4 MG disintegrating tablet   Diarrhea, unspecified type       Post-nasal drainage       Relevant Medications   fluticasone (FLONASE) 50 MCG/ACT nasal spray     Clinically with constellation of symptoms, not clear etiology - could be viral gastroenteritis for diarrhea/nausea vomiting, doesn't seem to be provoked by meals or other factors - possibly post nasal drainage or silent reflux triggering - no other focal GI cause of her symptoms based on history', does have GERD but seems less likely cause right now  Not consistent with COVID19 but cannot rule out, advised recommend use precautions regarding contact with others  For emphysema will order albuterol inhaler PRN for cough May warrant future daily maintenance therapy if need in future if worsening and needs better baseline control  Follow-up advice given if not improving    Meds ordered this encounter  Medications  . ondansetron (ZOFRAN ODT) 4 MG disintegrating tablet    Sig: Take 1 tablet (4 mg total) by mouth every 8 (eight) hours as needed for nausea or vomiting.  Dispense:  30 tablet    Refill:  0  . albuterol (VENTOLIN HFA) 108 (90 Base) MCG/ACT inhaler    Sig: Inhale 2 puffs into the lungs every 6 (six) hours as needed for wheezing or shortness of breath.    Dispense:  1 Inhaler    Refill:  0  . fluticasone (FLONASE) 50 MCG/ACT nasal spray    Sig: Place 2 sprays into both nostrils daily. Use for 4-6 weeks then stop and use seasonally or as needed.    Dispense:  16 g    Refill:  3     Follow-up: - Return in 1 week as needed if not improving  Patient verbalizes understanding with the above medical recommendations including the limitation of remote medical advice.  Specific follow-up and call-back criteria were given for patient to follow-up or seek medical care more urgently if needed.   - Time spent in direct consultation with patient on phone: 9 minutes   Saralyn PilarAlexander Williams Dietrick, DO [facility] 03/07/2019, 10:01 AM

## 2019-03-07 NOTE — Patient Instructions (Addendum)
Possible viral gastroenteritis or other viral source Treat nausea with zofran as needed Improve regular diet and hydration For cough, re order Albuterol PRN Start Flonase for sinuses as possible cause of trigger cough and nausea  Follow-up if not improving, can consider repeat chest x-ray and possibly antibiotics if indicated  Please schedule a Follow-up Appointment to: Return in about 1 week (around 03/14/2019), or if symptoms worsen or fail to improve, for nausea cough.  If you have any other questions or concerns, please feel free to call the office or send a message through MyChart. You may also schedule an earlier appointment if necessary.  Additionally, you may be receiving a survey about your experience at our office within a few days to 1 week by e-mail or mail. We value your feedback.  Saralyn Pilar, DO [facility], [place]

## 2019-03-19 DIAGNOSIS — F4321 Adjustment disorder with depressed mood: Secondary | ICD-10-CM | POA: Diagnosis not present

## 2019-03-28 DIAGNOSIS — F4321 Adjustment disorder with depressed mood: Secondary | ICD-10-CM | POA: Diagnosis not present

## 2019-04-04 DIAGNOSIS — F4321 Adjustment disorder with depressed mood: Secondary | ICD-10-CM | POA: Diagnosis not present

## 2019-04-12 DIAGNOSIS — F4321 Adjustment disorder with depressed mood: Secondary | ICD-10-CM | POA: Diagnosis not present

## 2019-04-20 DIAGNOSIS — F4321 Adjustment disorder with depressed mood: Secondary | ICD-10-CM | POA: Diagnosis not present

## 2019-04-23 DIAGNOSIS — F4321 Adjustment disorder with depressed mood: Secondary | ICD-10-CM | POA: Diagnosis not present

## 2019-04-26 ENCOUNTER — Encounter: Payer: Self-pay | Admitting: Family Medicine

## 2019-04-26 ENCOUNTER — Other Ambulatory Visit: Payer: Self-pay

## 2019-04-26 ENCOUNTER — Ambulatory Visit (INDEPENDENT_AMBULATORY_CARE_PROVIDER_SITE_OTHER): Payer: Medicaid Other | Admitting: Family Medicine

## 2019-04-26 DIAGNOSIS — J432 Centrilobular emphysema: Secondary | ICD-10-CM | POA: Diagnosis not present

## 2019-04-26 DIAGNOSIS — J441 Chronic obstructive pulmonary disease with (acute) exacerbation: Secondary | ICD-10-CM

## 2019-04-26 MED ORDER — LEVOFLOXACIN 500 MG PO TABS: 500.0000 mg | ORAL_TABLET | Freq: Every day | ORAL | 0 refills | Status: DC

## 2019-04-26 MED ORDER — PREDNISONE 50 MG PO TABS: 50.0000 mg | ORAL_TABLET | Freq: Every day | ORAL | 0 refills | Status: DC

## 2019-04-26 MED ORDER — BUDESONIDE-FORMOTEROL FUMARATE 160-4.5 MCG/ACT IN AERO: 2.0000 | INHALATION_SPRAY | Freq: Two times a day (BID) | RESPIRATORY_TRACT | 3 refills | Status: DC

## 2019-04-26 MED ORDER — ALBUTEROL SULFATE HFA 108 (90 BASE) MCG/ACT IN AERS: 2.0000 | INHALATION_SPRAY | Freq: Four times a day (QID) | RESPIRATORY_TRACT | 3 refills | Status: DC | PRN

## 2019-04-26 NOTE — Patient Instructions (Addendum)
Likely COPD exacerbation flare up  Start taking Levaquin antibiotic 500mg  daily x 7 days  - Start Prednisone 50mg  daily for next 5 days - this will open up lungs allow you to breath better and treat that wheezing or bronchospasm - Use Albuterol inhaler 2 puffs every 4-6 hours around the clock for next 2-3 days, max up to 5 days then use as needed   START SYMBICORT INHALER 2 puffs TWICE a day  - Use nasal saline (Simply Saline or Ocean Spray) to flush nasal congestion multiple times a day, may help cough - Drink plenty of fluids to improve congestion  If your symptoms seem to worsen instead of improve over next several days, including significant fever / chills, worsening shortness of breath, worsening wheezing, or nausea / vomiting and can't take medicines - return sooner or go to hospital Emergency Department for more immediate treatment.  [facility] COVID19 Testing Information  CALL OR NOTIFY OFFICE IF YOU NEED A COVID19 ORDER ONCE PLACED  All you need to do is arrive at a testing site. No appointment needed.  Hours (Open 8 a.m. - 3:45 p.m.) LAST TEST completed at 3:30pm  [place]: [facility] at [facility], [address], [place], [place]: [place], [place], [place], [place] (entrance off [employer] [address])  [place]: [place]. [address], [place], [place] (across from [facility] Emergency Department)  Test result may take 2-7 days to result. You will be notified by MyChart or by Phone.  Phone: [phone] [facility] contact, can inquire about status of test result)   If symptoms do not resolve or significantly improve OR if WORSENING - fever / cough - or worsening shortness of breath - then should contact us and seek advice on next steps in treatment at home vs where/when to seek care at Urgent Care or Hospital ED for further intervention    Please schedule a Follow-up Appointment to: Return in about  1 week (around 05/03/2019), or if symptoms worsen or fail to improve, for COPD flare.  If you have any other questions or concerns, please feel free to call the office or send a message through [place]. You may also schedule an earlier appointment if necessary.  Additionally, you may be receiving a survey about your experience at our office within a few days to 1 week by e-mail or mail. We value your feedback.  Nobie Putnam, DO [place]

## 2019-04-26 NOTE — Progress Notes (Signed)
Virtual Visit via Telephone The purpose of this virtual visit is to provide medical care while limiting exposure to the novel coronavirus (COVID19) for both patient and office staff.  Consent was obtained for phone visit:  Yes.   Answered questions that patient had about telehealth interaction:  Yes.   I discussed the limitations, risks, security and privacy concerns of performing an evaluation and management service by telephone. I also discussed with the patient that there may be a patient responsible charge related to this service. The patient expressed understanding and agreed to proceed.  Patient Location: Home Provider Location: [facility])   ---------------------------------------------------------------------- Chief Complaint  Patient presents with  . Fever    100.-101 temperature.Coughing, SOB, x 6 mths  . COPD    S: Reviewed CMA documentation. I have called patient and gathered additional HPI as follows:  Centrilobular Emphysema / Coughing, chronic Reports cough since January 2020, describes a persistent mild cough, episodic low grade fever, last discussion in past she was not interested in maintenance therapy, ordered albuterol PRN - Interval she has used albuterol PRN with good results but using too often - Now has flare up again with shortness of breath, low grade temp, productive coughing spells - Interested in other inhaler, her husband takes Symbicort. She is on Medicaid - No recent antibiotics  Denies any high risk travel to areas of current concern for COVID19. Denies any known or suspected exposure to person with or possibly with COVID19.  Denies any chills, sweats, body ache, sinus pain or pressure, headache, abdominal pain, diarrhea  -------------------------------------------------------------------------- O: No physical exam performed due to remote telephone  encounter.  -------------------------------------------------------------------------- A&P:   Consistent with recurrent mild moderate acute exacerbation of COPD with worsening productive cough. Similar to prior exacerbations. Mild fever, no known exposure COVID19 but at risk High risk with COPD for complication  Offered [id number] test done through [facility] test site, but she declined, can notify us if need test we can place order, gave her info  Plan: 1. Treat AECOPD - Start Prednisone 50mg  x 5 day steroid burst, Start taking Levaquin antibiotic 500mg  daily x 7 days 2. Use albuterol q 4 hr regularly x 2-3 days. RE order 3. ADD maintenance therapy 1st trial - Symbicort 2 puffs BID - on Medicaid PDL 4. Follow-up within 1 week if not improving, otherwise strict return criteria to go to ED   Meds ordered this encounter  Medications  . budesonide-formoterol (SYMBICORT) 160-4.5 MCG/ACT inhaler    Sig: Inhale 2 puffs into the lungs 2 (two) times daily.    Dispense:  1 Inhaler    Refill:  3  . albuterol (VENTOLIN HFA) 108 (90 Base) MCG/ACT inhaler    Sig: Inhale 2 puffs into the lungs every 6 (six) hours as needed for wheezing or shortness of breath.    Dispense:  8 g    Refill:  3  . predniSONE (DELTASONE) 50 MG tablet    Sig: Take 1 tablet (50 mg total) by mouth daily with breakfast.    Dispense:  5 tablet    Refill:  0  . levofloxacin (LEVAQUIN) 500 MG tablet    Sig: Take 1 tablet (500 mg total) by mouth daily. For 7 days    Dispense:  7 tablet    Refill:  0   OPTIONAL RECOMMENDED self quarantine for patient safety for PREVENTION ONLY. It is not required based on current clinical symptoms. If they were to develop fever or worsening shortness  of breath, then emphasis on REQUIRED quarantine for up to 7-14 days that could be resolved if fever free >3 days AND if symptoms improving after 7 days.   If symptoms do not resolve or significantly improve OR if WORSENING - fever / cough - or  worsening shortness of breath - then should contact us and seek advice on next steps in treatment at home vs where/when to seek care at Urgent Care or Hospital ED for further intervention and possible testing if indicated.  Patient verbalizes understanding with the above medical recommendations including the limitation of remote medical advice.  Specific follow-up / call-back criteria were given for patient to follow-up or seek medical care more urgently if needed.   - Time spent in direct consultation with patient on phone: 15 minutes   Saralyn PilarAlexander Tiersa Dayley, DO [facility] 04/26/2019, 10:09 AM

## 2019-05-03 DIAGNOSIS — F4321 Adjustment disorder with depressed mood: Secondary | ICD-10-CM | POA: Diagnosis not present

## 2019-05-09 DIAGNOSIS — F4321 Adjustment disorder with depressed mood: Secondary | ICD-10-CM | POA: Diagnosis not present

## 2019-05-18 DIAGNOSIS — F4321 Adjustment disorder with depressed mood: Secondary | ICD-10-CM | POA: Diagnosis not present

## 2019-05-22 DIAGNOSIS — F4321 Adjustment disorder with depressed mood: Secondary | ICD-10-CM | POA: Diagnosis not present

## 2019-05-29 DIAGNOSIS — F4321 Adjustment disorder with depressed mood: Secondary | ICD-10-CM | POA: Diagnosis not present

## 2019-06-03 DIAGNOSIS — F4321 Adjustment disorder with depressed mood: Secondary | ICD-10-CM | POA: Diagnosis not present

## 2019-06-10 DIAGNOSIS — F4321 Adjustment disorder with depressed mood: Secondary | ICD-10-CM | POA: Diagnosis not present

## 2019-06-18 DIAGNOSIS — F4321 Adjustment disorder with depressed mood: Secondary | ICD-10-CM | POA: Diagnosis not present

## 2019-06-23 DIAGNOSIS — F4321 Adjustment disorder with depressed mood: Secondary | ICD-10-CM | POA: Diagnosis not present

## 2019-07-04 DIAGNOSIS — F4321 Adjustment disorder with depressed mood: Secondary | ICD-10-CM | POA: Diagnosis not present

## 2019-07-11 DIAGNOSIS — F4321 Adjustment disorder with depressed mood: Secondary | ICD-10-CM | POA: Diagnosis not present

## 2019-07-15 DIAGNOSIS — F4321 Adjustment disorder with depressed mood: Secondary | ICD-10-CM | POA: Diagnosis not present

## 2019-07-26 DIAGNOSIS — F4321 Adjustment disorder with depressed mood: Secondary | ICD-10-CM | POA: Diagnosis not present

## 2019-07-30 DIAGNOSIS — F4321 Adjustment disorder with depressed mood: Secondary | ICD-10-CM | POA: Diagnosis not present

## 2019-08-06 DIAGNOSIS — F4321 Adjustment disorder with depressed mood: Secondary | ICD-10-CM | POA: Diagnosis not present

## 2019-08-15 DIAGNOSIS — F4321 Adjustment disorder with depressed mood: Secondary | ICD-10-CM | POA: Diagnosis not present

## 2019-08-21 DIAGNOSIS — F4321 Adjustment disorder with depressed mood: Secondary | ICD-10-CM | POA: Diagnosis not present

## 2019-08-28 DIAGNOSIS — F4321 Adjustment disorder with depressed mood: Secondary | ICD-10-CM | POA: Diagnosis not present

## 2019-09-03 DIAGNOSIS — F4321 Adjustment disorder with depressed mood: Secondary | ICD-10-CM | POA: Diagnosis not present

## 2019-09-11 DIAGNOSIS — F4321 Adjustment disorder with depressed mood: Secondary | ICD-10-CM | POA: Diagnosis not present

## 2019-09-12 DIAGNOSIS — F4321 Adjustment disorder with depressed mood: Secondary | ICD-10-CM | POA: Diagnosis not present

## 2019-09-18 DIAGNOSIS — F4321 Adjustment disorder with depressed mood: Secondary | ICD-10-CM | POA: Diagnosis not present

## 2019-09-21 DIAGNOSIS — F4321 Adjustment disorder with depressed mood: Secondary | ICD-10-CM | POA: Diagnosis not present

## 2019-09-25 DIAGNOSIS — F4321 Adjustment disorder with depressed mood: Secondary | ICD-10-CM | POA: Diagnosis not present

## 2019-09-30 DIAGNOSIS — F4321 Adjustment disorder with depressed mood: Secondary | ICD-10-CM | POA: Diagnosis not present

## 2019-10-07 DIAGNOSIS — F4321 Adjustment disorder with depressed mood: Secondary | ICD-10-CM | POA: Diagnosis not present

## 2019-10-17 DIAGNOSIS — F4321 Adjustment disorder with depressed mood: Secondary | ICD-10-CM | POA: Diagnosis not present

## 2019-10-21 DIAGNOSIS — F4321 Adjustment disorder with depressed mood: Secondary | ICD-10-CM | POA: Diagnosis not present

## 2019-10-31 DIAGNOSIS — F4321 Adjustment disorder with depressed mood: Secondary | ICD-10-CM | POA: Diagnosis not present

## 2019-11-04 DIAGNOSIS — F4321 Adjustment disorder with depressed mood: Secondary | ICD-10-CM | POA: Diagnosis not present

## 2019-11-11 DIAGNOSIS — F4321 Adjustment disorder with depressed mood: Secondary | ICD-10-CM | POA: Diagnosis not present

## 2019-11-15 ENCOUNTER — Other Ambulatory Visit: Payer: Self-pay | Admitting: Family Medicine

## 2019-11-15 DIAGNOSIS — R030 Elevated blood-pressure reading, without diagnosis of hypertension: Secondary | ICD-10-CM

## 2019-11-20 DIAGNOSIS — F4321 Adjustment disorder with depressed mood: Secondary | ICD-10-CM | POA: Diagnosis not present

## 2019-11-27 DIAGNOSIS — F4321 Adjustment disorder with depressed mood: Secondary | ICD-10-CM | POA: Diagnosis not present

## 2019-12-06 DIAGNOSIS — F4321 Adjustment disorder with depressed mood: Secondary | ICD-10-CM | POA: Diagnosis not present

## 2019-12-12 DIAGNOSIS — F4321 Adjustment disorder with depressed mood: Secondary | ICD-10-CM | POA: Diagnosis not present

## 2019-12-17 DIAGNOSIS — F4321 Adjustment disorder with depressed mood: Secondary | ICD-10-CM | POA: Diagnosis not present

## 2019-12-25 DIAGNOSIS — F4321 Adjustment disorder with depressed mood: Secondary | ICD-10-CM | POA: Diagnosis not present

## 2019-12-28 DIAGNOSIS — F4321 Adjustment disorder with depressed mood: Secondary | ICD-10-CM | POA: Diagnosis not present

## 2019-12-31 DIAGNOSIS — F4321 Adjustment disorder with depressed mood: Secondary | ICD-10-CM | POA: Diagnosis not present

## 2020-01-11 DIAGNOSIS — F4321 Adjustment disorder with depressed mood: Secondary | ICD-10-CM | POA: Diagnosis not present

## 2020-01-13 DIAGNOSIS — F4321 Adjustment disorder with depressed mood: Secondary | ICD-10-CM | POA: Diagnosis not present

## 2020-01-21 DIAGNOSIS — F4321 Adjustment disorder with depressed mood: Secondary | ICD-10-CM | POA: Diagnosis not present

## 2020-01-30 DIAGNOSIS — F4321 Adjustment disorder with depressed mood: Secondary | ICD-10-CM | POA: Diagnosis not present

## 2020-02-05 DIAGNOSIS — F4321 Adjustment disorder with depressed mood: Secondary | ICD-10-CM | POA: Diagnosis not present

## 2020-02-12 DIAGNOSIS — F4321 Adjustment disorder with depressed mood: Secondary | ICD-10-CM | POA: Diagnosis not present

## 2020-02-18 DIAGNOSIS — F4321 Adjustment disorder with depressed mood: Secondary | ICD-10-CM | POA: Diagnosis not present

## 2020-02-26 DIAGNOSIS — F4321 Adjustment disorder with depressed mood: Secondary | ICD-10-CM | POA: Diagnosis not present

## 2020-03-05 DIAGNOSIS — F4321 Adjustment disorder with depressed mood: Secondary | ICD-10-CM | POA: Diagnosis not present

## 2020-03-05 DIAGNOSIS — F4312 Post-traumatic stress disorder, chronic: Secondary | ICD-10-CM | POA: Diagnosis not present

## 2020-03-11 DIAGNOSIS — F4321 Adjustment disorder with depressed mood: Secondary | ICD-10-CM | POA: Diagnosis not present

## 2020-03-17 DIAGNOSIS — F4321 Adjustment disorder with depressed mood: Secondary | ICD-10-CM | POA: Diagnosis not present

## 2020-03-24 DIAGNOSIS — F4321 Adjustment disorder with depressed mood: Secondary | ICD-10-CM | POA: Diagnosis not present

## 2020-03-25 DIAGNOSIS — F4321 Adjustment disorder with depressed mood: Secondary | ICD-10-CM | POA: Diagnosis not present

## 2020-05-20 ENCOUNTER — Other Ambulatory Visit: Payer: Self-pay | Admitting: Family Medicine

## 2020-05-20 DIAGNOSIS — J432 Centrilobular emphysema: Secondary | ICD-10-CM

## 2020-05-20 NOTE — Telephone Encounter (Signed)
Requested Prescriptions  Pending Prescriptions Disp Refills   SYMBICORT 160-4.5 MCG/ACT inhaler [Pharmacy Med Name: SYMBICORT 160-4.5 MCG INHALER] 1 each 0    Sig: TAKE 2 PUFFS BY MOUTH TWICE A DAY     Pulmonology:  Combination Products Failed - 05/20/2020  2:00 AM      Failed - Valid encounter within last 12 months    Recent Outpatient Visits          1 year ago COPD with acute exacerbation [facility])   [facility], Netta Neat, DO   1 year ago Intractable vomiting with nausea, unspecified vomiting type   [facility], Netta Neat, DO   1 year ago Ulnar neuropathy of right upper extremity   [facility] Smitty Cords, DO   1 year ago Chronic left-sided low back pain with left-sided sciatica   [facility] Smitty Cords, DO   1 year ago Annual physical exam   [facility] Smitty Cords, DO             Courtesy Refill provided with reminder for patient to schedule an office visit.

## 2020-07-18 ENCOUNTER — Other Ambulatory Visit: Payer: Self-pay | Admitting: Family Medicine

## 2020-07-18 DIAGNOSIS — R030 Elevated blood-pressure reading, without diagnosis of hypertension: Secondary | ICD-10-CM

## 2020-07-18 NOTE — Telephone Encounter (Signed)
Requested medication (s) are due for refill today: yes  Requested medication (s) are on the active medication list: yes  Last refill:  11/15/19  Future visit scheduled: no  Notes to clinic:  Called and LM on VM to call office to make appt   Requested Prescriptions  Pending Prescriptions Disp Refills   lisinopril (ZESTRIL) 5 MG tablet [Pharmacy Med Name: LISINOPRIL 5 MG TABLET] 90 tablet 1    Sig: TAKE 1 TABLET BY MOUTH EVERY DAY      Cardiovascular:  ACE Inhibitors Failed - 07/18/2020 12:45 AM      Failed - Cr in normal range and within 180 days    Creat  Date Value Ref Range Status  07/30/2018 0.89 0.50 - 1.05 mg/dL Final    Comment:    For patients >48 years of age, the reference limit for Creatinine is approximately 13% higher for people identified as African-American. .    Creatinine, Ser  Date Value Ref Range Status  01/18/2019 0.76 0.44 - 1.00 mg/dL Final          Failed - K in normal range and within 180 days    Potassium  Date Value Ref Range Status  01/18/2019 3.5 3.5 - 5.1 mmol/L Final  01/07/2015 3.5 mmol/L Final    Comment:    3.5-5.1 NOTE: New Reference Range  12/16/14           Failed - Last BP in normal range    BP Readings from Last 1 Encounters:  01/18/19 (!) 150/95          Failed - Valid encounter within last 6 months    Recent Outpatient Visits           1 year ago COPD with acute exacerbation (HCC)   [facility] Smitty Cords, DO   1 year ago Intractable vomiting with nausea, unspecified vomiting type   [facility] Althea Charon, Netta Neat, DO   1 year ago Ulnar neuropathy of right upper extremity   [facility] Smitty Cords, DO   1 year ago Chronic left-sided low back pain with left-sided sciatica   [facility] Smitty Cords, DO   1 year ago Annual physical exam   [facility] Smitty Cords, DO               Passed - Patient is not pregnant

## 2020-11-20 ENCOUNTER — Other Ambulatory Visit: Payer: Self-pay | Admitting: Family Medicine

## 2020-11-20 DIAGNOSIS — R030 Elevated blood-pressure reading, without diagnosis of hypertension: Secondary | ICD-10-CM

## 2020-11-23 ENCOUNTER — Ambulatory Visit: Payer: Self-pay | Admitting: *Deleted

## 2020-11-23 ENCOUNTER — Emergency Department: Payer: Medicaid Other

## 2020-11-23 ENCOUNTER — Other Ambulatory Visit: Payer: Self-pay

## 2020-11-23 ENCOUNTER — Encounter: Payer: Self-pay | Admitting: Emergency Medicine

## 2020-11-23 ENCOUNTER — Emergency Department
Admission: EM | Admit: 2020-11-23 | Discharge: 2020-11-23 | Disposition: A | Discharge status: Home / Self Care | Payer: Medicaid Other | Attending: Emergency Medicine | Admitting: Emergency Medicine

## 2020-11-23 DIAGNOSIS — J430 Unilateral pulmonary emphysema [MacLeod's syndrome]: Secondary | ICD-10-CM | POA: Diagnosis not present

## 2020-11-23 DIAGNOSIS — R069 Unspecified abnormalities of breathing: Secondary | ICD-10-CM | POA: Diagnosis not present

## 2020-11-23 DIAGNOSIS — J45909 Unspecified asthma, uncomplicated: Secondary | ICD-10-CM | POA: Insufficient documentation

## 2020-11-23 DIAGNOSIS — I517 Cardiomegaly: Secondary | ICD-10-CM | POA: Diagnosis not present

## 2020-11-23 DIAGNOSIS — Z716 Tobacco abuse counseling: Secondary | ICD-10-CM

## 2020-11-23 DIAGNOSIS — Z8616 Personal history of COVID-19: Secondary | ICD-10-CM | POA: Insufficient documentation

## 2020-11-23 DIAGNOSIS — Z20822 Contact with and (suspected) exposure to covid-19: Secondary | ICD-10-CM | POA: Insufficient documentation

## 2020-11-23 DIAGNOSIS — F17210 Nicotine dependence, cigarettes, uncomplicated: Secondary | ICD-10-CM | POA: Insufficient documentation

## 2020-11-23 DIAGNOSIS — R231 Pallor: Secondary | ICD-10-CM | POA: Diagnosis not present

## 2020-11-23 DIAGNOSIS — J439 Emphysema, unspecified: Secondary | ICD-10-CM | POA: Diagnosis not present

## 2020-11-23 DIAGNOSIS — I7090 Unspecified atherosclerosis: Secondary | ICD-10-CM | POA: Diagnosis not present

## 2020-11-23 DIAGNOSIS — R059 Cough, unspecified: Secondary | ICD-10-CM | POA: Diagnosis not present

## 2020-11-23 DIAGNOSIS — R0602 Shortness of breath: Secondary | ICD-10-CM | POA: Diagnosis not present

## 2020-11-23 DIAGNOSIS — K449 Diaphragmatic hernia without obstruction or gangrene: Secondary | ICD-10-CM | POA: Diagnosis not present

## 2020-11-23 DIAGNOSIS — U071 COVID-19: Secondary | ICD-10-CM | POA: Diagnosis not present

## 2020-11-23 DIAGNOSIS — R0689 Other abnormalities of breathing: Secondary | ICD-10-CM | POA: Diagnosis not present

## 2020-11-23 LAB — CBC WITH DIFFERENTIAL/PLATELET
Abs Immature Granulocytes: 0.06 K/uL (ref 0.00–0.07)
Basophils Absolute: 0 K/uL (ref 0.0–0.1)
Basophils Relative: 1 %
Eosinophils Absolute: 0.2 K/uL (ref 0.0–0.5)
Eosinophils Relative: 2 %
HCT: 43.1 % (ref 36.0–46.0)
Hemoglobin: 14.8 g/dL (ref 12.0–15.0)
Immature Granulocytes: 1 %
Lymphocytes Relative: 26 %
Lymphs Abs: 2.3 K/uL (ref 0.7–4.0)
MCH: 32.5 pg (ref 26.0–34.0)
MCHC: 34.3 g/dL (ref 30.0–36.0)
MCV: 94.5 fL (ref 80.0–100.0)
Monocytes Absolute: 0.6 K/uL (ref 0.1–1.0)
Monocytes Relative: 6 %
Neutro Abs: 5.7 K/uL (ref 1.7–7.7)
Neutrophils Relative %: 64 %
Platelets: 251 K/uL (ref 150–400)
RBC: 4.56 MIL/uL (ref 3.87–5.11)
RDW: 12.1 % (ref 11.5–15.5)
WBC: 8.8 K/uL (ref 4.0–10.5)
nRBC: 0 % (ref 0.0–0.2)

## 2020-11-23 LAB — COMPREHENSIVE METABOLIC PANEL
ALT: 31 U/L (ref 0–44)
AST: 22 U/L (ref 15–41)
Albumin: 4.4 g/dL (ref 3.5–5.0)
Alkaline Phosphatase: 83 U/L (ref 38–126)
Anion gap: 10 (ref 5–15)
BUN: 10 mg/dL (ref 6–20)
CO2: 25 mmol/L (ref 22–32)
Calcium: 10.6 mg/dL — ABNORMAL HIGH (ref 8.9–10.3)
Chloride: 102 mmol/L (ref 98–111)
Creatinine, Ser: 0.82 mg/dL (ref 0.44–1.00)
GFR, Estimated: >60 mL/min (ref >60)
Glucose, Bld: 124 mg/dL — ABNORMAL HIGH (ref 70–99)
Potassium: 4.3 mmol/L (ref 3.5–5.1)
Sodium: 137 mmol/L (ref 135–145)
Total Bilirubin: 0.8 mg/dL (ref 0.3–1.2)
Total Protein: 8.2 g/dL — ABNORMAL HIGH (ref 6.5–8.1)

## 2020-11-23 LAB — RESP PANEL BY RT-PCR (FLU A&B, COVID) ARPGX2
Influenza A by PCR: NEGATIVE
Influenza B by PCR: NEGATIVE
SARS Coronavirus 2 by RT PCR: NEGATIVE

## 2020-11-23 LAB — TROPONIN I (HIGH SENSITIVITY): Troponin I (High Sensitivity): 4 ng/L

## 2020-11-23 LAB — PROCALCITONIN: Procalcitonin: <0.1 ng/mL

## 2020-11-23 LAB — BRAIN NATRIURETIC PEPTIDE: B Natriuretic Peptide: 37 pg/mL (ref 0.0–100.0)

## 2020-11-23 MED ORDER — IOHEXOL 350 MG/ML SOLN
75.0000 mL | Freq: Once | INTRAVENOUS | Status: AC | PRN
  Administered 2020-11-23: 75 mL via INTRAVENOUS
  Filled 2020-11-23: qty 75

## 2020-11-23 NOTE — ED Triage Notes (Signed)
First Nurse Note:  Arrives via [facility] for C?O SOB.  Per rerpot, C?O SOB x 1 week with possible COVID exposure.  + non productive cough and congestions.  RA sats 93%, placed on 2l/ [place] sats 97%.  VS wnl.

## 2020-11-23 NOTE — Telephone Encounter (Signed)
Pt called with complaints of shortness of breath since he end of December; she lost her sense of taste and smell in January; she thought she had COVID but was not tested; her shortness of breath worsened over the past 2-3 days it has gotten unbearable; she has shortness of breath at rest and with exertion; recommendations made per nurse triage protocol; the pt declined offer to call EMS; the pt says she will call them; she sees Dr Althea Charon, [facility]; will route to office for notification.  Reason for Disposition . SEVERE difficulty breathing (e.g., struggling for each breath, speaks in single words)  Answer Assessment - Initial Assessment Questions 1. RESPIRATORY STATUS: "Describe your breathing?" (e.g., wheezing, shortness of breath, unable to speak, severe coughing)      "unbearable" hortness of breath 2. ONSET: "When did this breathing problem begin?"    Dec 2021; worsened 11/20/20 3. PATTERN "Does the difficult breathing come and go, or has it been constant since it started?"    Worsening; now constant 4. SEVERITY: "How bad is your breathing?" (e.g., mild, moderate, severe)    - MILD: No SOB at rest, mild SOB with walking, speaks normally in sentences, can lay down, no retractions, pulse < 100.    - MODERATE: SOB at rest, SOB with minimal exertion and prefers to sit, cannot lie down flat, speaks in phrases, mild retractions, audible wheezing, pulse 100-120.    - SEVERE: Very SOB at rest, speaks in single words, struggling to breathe, sitting hunched forward, retractions, pulse > 120     severe 5. RECURRENT SYMPTOM: "Have you had difficulty breathing before?" If Yes, ask: "When was the last time?" and "What happened that time?"      Yes COPD, pneumonia 6. CARDIAC HISTORY: "Do you have any history of heart disease?" (e.g., heart attack, angina, bypass surgery, angioplasty)       7. LUNG HISTORY: "Do you have any history of lung disease?"  (e.g., pulmonary embolus, asthma,  emphysema)   Yes COPD 8. CAUSE: "What do you think is causing the breathing problem?"      ?COVID Jan 2021 9. OTHER SYMPTOMS: "Do you have any other symptoms? (e.g., dizziness, runny nose, cough, chest pain, fever)     Non-productive cough 10. PREGNANCY: "Is there any chance you are pregnant?" "When was your last menstrual period?"        11. TRAVEL: "Have you traveled out of the country in the last month?" (e.g., travel history, exposures)  Protocols used: BREATHING DIFFICULTY-A-AH

## 2020-11-23 NOTE — ED Provider Notes (Signed)
[facility] Emergency Department Provider Note  ____________________________________________   Event Date/Time   First MD Initiated Contact with Patient 11/23/20 1309     (approximate)  I have reviewed the triage vital signs and the nursing notes.   HISTORY  Chief Complaint Shortness of Breath   HPI Christy Mayer is a 55 y.o. female with a past medical history of arthritis, asthma, COPD, ongoing tobacco abuse, mitral prolapse and diagnosis of COVID-19 in mid January after she experienced loss of taste and smellof coffee baseline who presents for assessment approximately 1 week of worsening shortness of breath associate with cough and congestion.  She denies any chest pain, abdominal pain, headache, earache, sore throat, vomiting, diarrhea, dysuria, rash or extremity pain.  No recent falls or injuries.  Her shortness of breath is aggravated by minimal exertion.  No other clear alleviating or aggravating factors.         Past Medical History:  Diagnosis Date  . Arthritis   . Asthma   . Back pain   . COPD (chronic obstructive pulmonary disease) (HCC)   . Emphysema lung (HCC)   . Heart palpitations   . Miscarriage   . Mitral valve prolapse     Patient Active Problem List   Diagnosis Date Noted  . Elevated BP without diagnosis of hypertension 09/18/2018  . Ulnar neuropathy of right upper extremity 08/20/2018  . Osteoarthritis of spine with radiculopathy, cervical region 08/20/2018  . Gastroesophageal reflux disease 08/06/2018  . Elevated hemoglobin A1c 07/30/2018  . Mixed hyperlipidemia 07/30/2018  . Osteoarthritis, multiple sites 07/19/2016  . Major depression, recurrent, chronic (HCC) 06/30/2016  . Chronic left-sided low back pain with left-sided sciatica 06/30/2016  . Centrilobular emphysema (HCC) 06/30/2016  . Obesity (BMI 35.0-39.9 without comorbidity) 06/30/2016  . GAD (generalized anxiety disorder) 09/17/2015  . 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 Tobacco use disorder 01/05/2015  . Dyspnea 01/05/2015    Past Surgical History:  Procedure Laterality Date  . CESAREAN SECTION  11/2007    Prior to Admission medications   Medication Sig Start Date End Date Taking? Authorizing Provider  albuterol (VENTOLIN HFA) 108 (90 Base) MCG/ACT inhaler Inhale 2 puffs into the lungs every 6 (six) hours as needed for wheezing or shortness of breath. 04/26/19  Yes Karamalegos, Netta Neat, DO  buPROPion (WELLBUTRIN XL) 150 MG 24 hr tablet Take 1 tablet by mouth daily. 12/27/18  Yes [provider]  cloNIDine HCl (KAPVAY) 0.1 MG TB12 ER tablet Take 1 tablet by mouth daily. 12/27/18  Yes [provider]  DULoxetine (CYMBALTA) 60 MG capsule Take 2 capsules by mouth daily.  12/27/18  Yes [provider]  lamoTRIgine (LAMICTAL) 25 MG tablet Take 25 mg by mouth daily. 10/20/20  Yes [provider]  lisinopril (ZESTRIL) 5 MG tablet TAKE 1 TABLET BY MOUTH EVERY DAY 11/15/19  Yes Karamalegos, Netta Neat, DO  pregabalin (LYRICA) 75 MG capsule Take 1 capsule (75 mg total) by mouth 2 (two) times daily. 09/18/18  Yes Karamalegos, Netta Neat, DO  SYMBICORT 160-4.5 MCG/ACT inhaler TAKE 2 PUFFS BY MOUTH TWICE A DAY 05/20/20  Yes Karamalegos, Netta Neat, DO  traZODone (DESYREL) 50 MG tablet Take 300 mg by mouth at bedtime.  08/02/18  Yes [provider]  fluticasone (FLONASE) 50 MCG/ACT nasal spray Place 2 sprays into both nostrils daily. Use for 4-6 weeks then stop and use seasonally or as needed. Patient not taking: No sig reported 03/07/19   Smitty Cords,  DO    Allergies Aspirin  Family History  Adopted: Yes    Social History Social History   Tobacco Use  . Smoking status: Current Every Day Smoker    Packs/day: 0.50    Years: 30.00    Pack years: 15.00    Types: Cigarettes  . Smokeless tobacco: Never Used  Vaping Use  . Vaping Use: Never used  Substance Use Topics  .  Alcohol use: Yes    Alcohol/week: 0.0 standard drinks    Comment: occasionally   . Drug use: No    Review of Systems  Review of Systems  Constitutional: Positive for malaise/fatigue. Negative for chills and fever.  HENT: Negative for sore throat.   Eyes: Negative for pain.  Respiratory: Positive for sputum production and shortness of breath. Negative for cough and stridor.   Cardiovascular: Negative for chest pain.  Gastrointestinal: Negative for vomiting.  Genitourinary: Negative for dysuria.  Musculoskeletal: Negative for myalgias.  Skin: Negative for rash.  Neurological: Negative for seizures, loss of consciousness and headaches.  Psychiatric/Behavioral: Negative for suicidal ideas.  All other systems reviewed and are negative.     ____________________________________________   PHYSICAL EXAM:  VITAL SIGNS: ED Triage Vitals  Enc Vitals Group     BP 11/23/20 1124 (!) 159/93     Pulse Rate 11/23/20 1124 81     Resp 11/23/20 1124 16     Temp 11/23/20 1124 98.9 F (37.2 C)     Temp Source 11/23/20 1124 Oral     SpO2 11/23/20 1124 92 %     Weight 11/23/20 1125 205 lb (93 kg)     Height 11/23/20 1125 5\' 7"  (1.702 m)     Head Circumference --      Peak Flow --      Pain Score 11/23/20 1124 0     Pain Loc --      Pain Edu? --      Excl. in GC? --    Vitals:   11/23/20 1338 11/23/20 1400  BP:  131/86  Pulse: 72 70  Resp: 20 15  Temp:    SpO2: 98% 94%   Physical Exam Vitals and nursing note reviewed.  Constitutional:      General: She is not in acute distress.    Appearance: She is well-developed and well-nourished. She is obese.  HENT:     Head: Normocephalic and atraumatic.     Right Ear: External ear normal.     Left Ear: External ear normal.     Nose: Nose normal.     Mouth/Throat:     Mouth: Mucous membranes are moist.  Eyes:     Conjunctiva/sclera: Conjunctivae normal.  Cardiovascular:     Rate and Rhythm: Normal rate and regular rhythm.     Heart  sounds: No murmur heard.   Pulmonary:     Effort: Pulmonary effort is normal. No respiratory distress.     Breath sounds: Decreased breath sounds present.  Abdominal:     Palpations: Abdomen is soft.     Tenderness: There is no abdominal tenderness.  Musculoskeletal:        General: No edema.     Cervical back: Neck supple.  Skin:    General: Skin is warm and dry.     Capillary Refill: Capillary refill takes less than 2 seconds.  Neurological:     Mental Status: She is alert and oriented to person, place, and time.  Psychiatric:  Mood and Affect: Mood and affect and mood normal.      ____________________________________________   LABS (all labs ordered are listed, but only abnormal results are displayed)  Labs Reviewed  COMPREHENSIVE METABOLIC PANEL - Abnormal; Notable for the following components:      Result Value   Glucose, Bld 124 (*)    Calcium 10.6 (*)    Total Protein 8.2 (*)    All other components within normal limits  RESP PANEL BY RT-PCR (FLU A&B, COVID) ARPGX2  CBC WITH DIFFERENTIAL/PLATELET  BRAIN NATRIURETIC PEPTIDE  PROCALCITONIN  TROPONIN I (HIGH SENSITIVITY)  TROPONIN I (HIGH SENSITIVITY)   ____________________________________________  EKG  Sinus rhythm with a ventricular of 68, normal axis, unremarkable's, no clear evidence of acute ischemia or significant delay arrhythmia. ____________________________________________  RADIOLOGY  ED MD interpretation: No evidence of PE, pulmonary edema, large effusion, pneumothorax or other clear acute intrathoracic process.  There are some findings in the esophagus suggestive of esophagitis as well as evidence of chronic scarring in the lungs and aortic atherosclerosis.   Official radiology report(s): CT Angio Chest PE W and/or Wo Contrast  Result Date: 11/23/2020 CLINICAL DATA:  Shortness of breath with nonproductive cough since December 2021. EXAM: CT ANGIOGRAPHY CHEST WITH CONTRAST TECHNIQUE:  Multidetector CT imaging of the chest was performed using the standard protocol during bolus administration of intravenous contrast. Multiplanar CT image reconstructions and MIPs were obtained to evaluate the vascular anatomy. CONTRAST:  67mL OMNIPAQUE IOHEXOL 350 MG/ML SOLN COMPARISON:  Chest radiograph 12/12/2018 and CT chest from 08/25/2015 FINDINGS: Cardiovascular: No filling defect is identified in the pulmonary arterial tree to suggest pulmonary embolus. Mild atherosclerotic calcification of the aortic arch. Borderline cardiomegaly. Mediastinum/Nodes: Right hilar node 0.6 cm in short axis on image 52 series 7, within normal limits. Small type 1 hiatal hernia. Mild distal esophageal wall thickening is suspected, esophagitis of be the most common cause for this appearance. Lungs/Pleura: Chronic medial scarring in the right middle lobe. Mild lingular and left lower lobe scarring is also observed. Upper Abdomen: Unremarkable Musculoskeletal: Mild thoracic spondylosis. Review of the MIP images confirms the above findings. IMPRESSION: 1. No filling defect is identified in the pulmonary arterial tree to suggest pulmonary embolus. 2. Small type 1 hiatal hernia. Mild distal esophageal wall thickening is suspected, esophagitis of be the most common cause for this appearance. 3. Borderline cardiomegaly. 4. Chronic scarring in the right middle lobe and lingula. 5. Aortic atherosclerosis. Aortic Atherosclerosis (ICD10-I70.0). Electronically Signed   By: Gaylyn Rong M.D.   On: 11/23/2020 14:56    ____________________________________________   PROCEDURES  Procedure(s) performed (including Critical Care):  .1-3 Lead EKG Interpretation Performed by: Gilles Chiquito, MD Authorized by: Gilles Chiquito, MD     Interpretation: normal     ECG rate assessment: normal     Rhythm: sinus rhythm     Ectopy: none     Conduction: normal       ____________________________________________   INITIAL  IMPRESSION / ASSESSMENT AND PLAN / ED COURSE        Patient presents with above-stated exam for assessment of worsening shortness of breath over the last couple of weeks in the setting of self diagnosis of COVID-19 based on loss of taste and smell couple of weeks ago.  On arrival patient is afebrile hemodynamically stable.  Primary differential includes but is not limited to ACS, arrhythmia, PE, pneumonia, heart failure, symptomatic anemia metabolic derangements, COPD/asthma exacerbation, and chronic damage from COVID.  CTA shows no  evidence of PE, pneumonia, edema, large effusion or any other clear acute intrathoracic process of explain patient's worsening shortness of breath.  In addition her BNP is double digits and consistent with acute heart failure.  CBC shows no leukocytosis or acute anemia.  In addition patient is nonfebrile and have low suspicion for acute infectious process.  CMP shows no significant electrolyte or metabolic derangements.  Very low suspicion for ACS or myocarditis given nonelevated troponin and reassuring EKG both obtained greater than 3 hours after symptom onset.  Covid is negative today although certainly possible patient had Covid infection several weeks ago based on description of symptoms.  No tachypnea wheezing or hypoxia to suggest acute COPD or asthma exacerbation.  Given stable vitals with reassuring exam and work-up I believe patient safe for discharge with plan for continued outpatient PCP evaluation.  Counseled patient on importance of tobacco cessation.  Discharged stable condition.  Strict return precautions advised and discussed.  ____________________________________________   FINAL CLINICAL IMPRESSION(S) / ED DIAGNOSES  Final diagnoses:  SOB (shortness of breath)  Encounter for tobacco use cessation counseling  Atherosclerosis  Pulmonary emphysema, unspecified emphysema type (HCC)    Medications  iohexol (OMNIPAQUE) 350 MG/ML injection 75 mL (75  mLs Intravenous Contrast Given 11/23/20 1437)     ED Discharge Orders    None       Note:  This document was prepared using Dragon voice recognition software and may include unintentional dictation errors.   Gilles Chiquito, MD 11/23/20 [phone]

## 2020-11-23 NOTE — ED Triage Notes (Signed)
Patient states she has not felt well since the end of December.  AAOx3.  Skin warm and dry. NAD

## 2020-12-04 ENCOUNTER — Other Ambulatory Visit: Payer: Self-pay | Admitting: Family Medicine

## 2020-12-04 DIAGNOSIS — J432 Centrilobular emphysema: Secondary | ICD-10-CM

## 2020-12-04 NOTE — Telephone Encounter (Signed)
   Notes to clinic: Patient is overdue for appt  Vm has been left for patient  Script has expired  Requested Prescriptions  Pending Prescriptions Disp Refills   SYMBICORT 160-4.5 MCG/ACT inhaler [Pharmacy Med Name: SYMBICORT 160-4.5 MCG INHALER] 10.2 each     Sig: TAKE 2 PUFFS BY MOUTH TWICE A DAY      Pulmonology:  Combination Products Failed - 12/04/2020 10:41 AM      Failed - Valid encounter within last 12 months    Recent Outpatient Visits           1 year ago COPD with acute exacerbation [facility])   [facility], Netta Neat, DO   1 year ago Intractable vomiting with nausea, unspecified vomiting type   [facility] [place], Netta Neat, DO   2 years ago Ulnar neuropathy of right upper extremity   [facility] [place], Netta Neat, DO   2 years ago Chronic left-sided low back pain with left-sided sciatica   [facility] Althea Charon, Netta Neat, DO   2 years ago Annual physical exam   [facility] [place], Netta Neat, DO                  albuterol (VENTOLIN HFA) 108 (90 Base) MCG/ACT inhaler [Pharmacy Med Name: ALBUTEROL HFA (PROAIR) INHALER] 8.5 each 3    Sig: TAKE 2 PUFFS BY MOUTH EVERY 6 HOURS AS NEEDED FOR WHEEZE OR SHORTNESS OF BREATH      Pulmonology:  Beta Agonists Failed - 12/04/2020 10:41 AM      Failed - One inhaler should last at least one month. If the patient is requesting refills earlier, contact the patient to check for uncontrolled symptoms.      Failed - Valid encounter within last 12 months    Recent Outpatient Visits           1 year ago COPD with acute exacerbation [facility])   [facility], Netta Neat, DO   1 year ago Intractable vomiting with nausea, unspecified vomiting type   [facility] [place], Netta Neat, DO   2 years ago Ulnar neuropathy of right upper extremity   [facility]  [place], Netta Neat, DO   2 years ago Chronic left-sided low back pain with left-sided sciatica   [facility] Smitty Cords, DO   2 years ago Annual physical exam   [facility], Netta Neat, DO

## 2021-01-12 ENCOUNTER — Other Ambulatory Visit: Payer: Self-pay | Admitting: Family Medicine

## 2021-01-12 DIAGNOSIS — J432 Centrilobular emphysema: Secondary | ICD-10-CM

## 2021-01-12 NOTE — Telephone Encounter (Signed)
Requested medications are due for refill today yes  Requested medications are on the active medication list yes  Last refill 2/25  Last visit 04/2019  Future visit scheduled no  Notes to clinic Failed protocol due to no valid visit within 12  months, no upcoming visit scheduled.

## 2021-01-15 ENCOUNTER — Other Ambulatory Visit: Payer: Self-pay | Admitting: Family Medicine

## 2021-01-15 DIAGNOSIS — J432 Centrilobular emphysema: Secondary | ICD-10-CM

## 2021-01-15 NOTE — Telephone Encounter (Signed)
Requested medication (s) are due for refill today: yes  Requested medication (s) are on the active medication list: yes  Last refill:  12/04/20 #10.5 each 0 refills  Future visit scheduled: no  Notes to clinic:  do you want courtesy refill? Called patient to schedule appt. No answer, left message to call clinic to schedule app.     Requested Prescriptions  Pending Prescriptions Disp Refills   SYMBICORT 160-4.5 MCG/ACT inhaler [Pharmacy Med Name: SYMBICORT 160-4.5 MCG INHALER] 10.2 each 0    Sig: TAKE 2 PUFFS BY MOUTH TWICE A DAY      Pulmonology:  Combination Products Failed - 01/15/2021  6:05 PM      Failed - Valid encounter within last 12 months    Recent Outpatient Visits           1 year ago COPD with acute exacerbation [facility])   [facility], Netta Neat, DO   1 year ago Intractable vomiting with nausea, unspecified vomiting type   [facility], Netta Neat, DO   2 years ago Ulnar neuropathy of right upper extremity   [facility] [place], Netta Neat, DO   2 years ago Chronic left-sided low back pain with left-sided sciatica   [facility] Smitty Cords, DO   2 years ago Annual physical exam   [facility] [place], Netta Neat, DO

## 2021-02-23 ENCOUNTER — Other Ambulatory Visit: Payer: Self-pay

## 2021-02-23 ENCOUNTER — Encounter: Payer: Self-pay | Admitting: Family Medicine

## 2021-02-23 ENCOUNTER — Ambulatory Visit (INDEPENDENT_AMBULATORY_CARE_PROVIDER_SITE_OTHER): Payer: Medicaid Other | Admitting: Family Medicine

## 2021-02-23 VITALS — Ht 67.0 in | Wt 225.0 lb

## 2021-02-23 DIAGNOSIS — G43009 Migraine without aura, not intractable, without status migrainosus: Secondary | ICD-10-CM | POA: Diagnosis not present

## 2021-02-23 DIAGNOSIS — G43909 Migraine, unspecified, not intractable, without status migrainosus: Secondary | ICD-10-CM | POA: Insufficient documentation

## 2021-02-23 DIAGNOSIS — J0110 Acute frontal sinusitis, unspecified: Secondary | ICD-10-CM

## 2021-02-23 MED ORDER — FLUTICASONE PROPIONATE 50 MCG/ACT NA SUSP: 2.0000 | Freq: Every day | NASAL | 3 refills | Status: DC

## 2021-02-23 MED ORDER — AMOXICILLIN-POT CLAVULANATE 875-125 MG PO TABS: 1.0000 | ORAL_TABLET | Freq: Two times a day (BID) | ORAL | 0 refills | Status: DC

## 2021-02-23 MED ORDER — SUMATRIPTAN SUCCINATE 50 MG PO TABS: 50.0000 mg | ORAL_TABLET | Freq: Once | ORAL | 2 refills | Status: DC | PRN

## 2021-02-23 MED ORDER — PREDNISONE 20 MG PO TABS: ORAL_TABLET | ORAL | 0 refills | Status: DC

## 2021-02-23 NOTE — Progress Notes (Signed)
Virtual Visit via Telephone The purpose of this virtual visit is to provide medical care while limiting exposure to the novel coronavirus (COVID19) for both patient and office staff.  Consent was obtained for phone visit:  Yes.   Answered questions that patient had about telehealth interaction:  Yes.   I discussed the limitations, risks, security and privacy concerns of performing an evaluation and management service by telephone. I also discussed with the patient that there may be a patient responsible charge related to this service. The patient expressed understanding and agreed to proceed.  Patient Location: Home Provider Location: [facility] (Office)  Participants in virtual visit: - Patient: Christy Mayer - CMA: Burnell Blanks, CMA - Provider: Dr Althea Charon  ---------------------------------------------------------------------- Chief Complaint  Patient presents with  . Headache  . Cough  . Fever    S: Reviewed CMA documentation. I have called patient and gathered additional HPI as follows:  Sinusitis  Migraine Headache Reports that symptoms started about 10 days ago with bad headache and light sensitivity and nausea felt like migraines. She takes Ibuprofen, Tylenol PRN. Not taking excedrin. Normally doesn't have many migraine headaches, and this one is more severe than usual. Normally last for 1 day or less and resolve w/ meds.  Not on migraine prevention medication or not on imitrex or other med similar - Admits some sinus drainage in evening, also with productive thicker cough foul smelling purulent. Admits low grade temp  Works from home when to be cleared to resume work. Needs note.  Denies any known or suspected exposure to person with or possibly with COVID19.  Denies any chills, sweats, body ache, shortness of breath, abdominal pain, diarrhea  Past Medical History:  Diagnosis Date  . Arthritis   . Asthma   . Back pain   . COPD (chronic  obstructive pulmonary disease) (HCC)   . Emphysema lung (HCC)   . Heart palpitations   . Miscarriage   . Mitral valve prolapse    Social History   Tobacco Use  . Smoking status: Current Every Day Smoker    Packs/day: 0.50    Years: 30.00    Pack years: 15.00    Types: Cigarettes  . Smokeless tobacco: Never Used  Vaping Use  . Vaping Use: Never used  Substance Use Topics  . Alcohol use: Yes    Alcohol/week: 0.0 standard drinks    Comment: occasionally   . Drug use: No    Current Outpatient Medications:  .  amoxicillin-clavulanate (AUGMENTIN) 875-125 MG tablet, Take 1 tablet by mouth 2 (two) times daily., Disp: 20 tablet, Rfl: 0 .  fluticasone (FLONASE) 50 MCG/ACT nasal spray, Place 2 sprays into both nostrils daily. Use for 4-6 weeks then stop and use seasonally or as needed., Disp: 16 g, Rfl: 3 .  lamoTRIgine (LAMICTAL) 25 MG tablet, Take 25 mg by mouth daily., Disp: , Rfl:  .  predniSONE (DELTASONE) 20 MG tablet, Take daily with food. Start with 60mg  (3 pills) x 2 days, then reduce to 40mg  (2 pills) x 2 days, then 20mg  (1 pill) x 3 days, Disp: 13 tablet, Rfl: 0 .  SUMAtriptan (IMITREX) 50 MG tablet, Take 1 tablet (50 mg total) by mouth once as needed for up to 1 dose for migraine. May repeat one dose in 2 hours if headache persists, for max dose 24 hours, Disp: 12 tablet, Rfl: 2 .  albuterol (VENTOLIN HFA) 108 (90 Base) MCG/ACT inhaler, TAKE 2 PUFFS BY MOUTH EVERY 6  HOURS AS NEEDED FOR WHEEZE OR SHORTNESS OF BREATH, Disp: 8.5 each, Rfl: 0 .  budesonide-formoterol (SYMBICORT) 160-4.5 MCG/ACT inhaler, Inhale 2 puffs into the lungs daily. Need future visit for refills, Disp: 10.2 each, Rfl: 0 .  buPROPion (WELLBUTRIN XL) 150 MG 24 hr tablet, Take 1 tablet by mouth daily., Disp: , Rfl:  .  cloNIDine HCl (KAPVAY) 0.1 MG TB12 ER tablet, Take 1 tablet by mouth daily., Disp: , Rfl:  .  DULoxetine (CYMBALTA) 60 MG capsule, Take 2 capsules by mouth daily. , Disp: , Rfl:  .  lisinopril  (ZESTRIL) 5 MG tablet, TAKE 1 TABLET BY MOUTH EVERY DAY, Disp: 90 tablet, Rfl: 1 .  pregabalin (LYRICA) 75 MG capsule, Take 1 capsule (75 mg total) by mouth 2 (two) times daily., Disp: 60 capsule, Rfl: 2 .  traZODone (DESYREL) 50 MG tablet, Take 300 mg by mouth at bedtime. , Disp: , Rfl: 0  Depression screen [facility] 2/9 04/26/2019 03/07/2019 09/18/2018  Decreased Interest 1 0 1  Down, Depressed, Hopeless 0 0 1  PHQ - 2 Score 1 0 2  Altered sleeping 0 - 3  Tired, decreased energy 1 - 3  Change in appetite 0 - 3  Feeling bad or failure about yourself  0 - 1  Trouble concentrating 0 - 2  Moving slowly or fidgety/restless 0 - 1  Suicidal thoughts 0 - 0  PHQ-9 Score 2 - 15  Difficult doing work/chores Somewhat difficult - Somewhat difficult    GAD 7 : Generalized Anxiety Score 09/18/2018 08/06/2018 07/19/2016 06/30/2016  Nervous, Anxious, on Edge 3 3 2 3   Control/stop worrying 1 3 2 3   Worry too much - different things 2 3 2 3   Trouble relaxing 3 3 1 3   Restless 2 3 1 3   Easily annoyed or irritable 1 3 3 3   Afraid - awful might happen 2 3 1 2   Total GAD 7 Score 14 21 12 20   Anxiety Difficulty Somewhat difficult Very difficult Not difficult at all Not difficult at all    -------------------------------------------------------------------------- O: No physical exam performed due to remote telephone encounter.  Lab results reviewed.  No results found for this or any previous visit (from the past 2160 hour(s)).  -------------------------------------------------------------------------- A&P:  Problem List Items Addressed This Visit    Migraine headache   Relevant Medications   SUMAtriptan (IMITREX) 50 MG tablet    Other Visit Diagnoses    Acute non-recurrent frontal sinusitis    -  Primary   Relevant Medications   amoxicillin-clavulanate (AUGMENTIN) 875-125 MG tablet   fluticasone (FLONASE) 50 MCG/ACT nasal spray   predniSONE (DELTASONE) 20 MG tablet     Consistent with acute  frontal rhinosinusitis, likely initially viral URI vs allergic rhinitis component with worsening concern for bacterial infection.  Duration >10 days  Plan: 1. Start Augmentin 875-125mg  PO BID x 10 days 2. Start nasal steroid Flonase 2 sprays in each nostril daily for 4-6 weeks, may repeat course seasonally or as needed 3. Add Prednisone taper for sinus / headache / also with COPD and coughing  Return criteria reviewed  -----  Acute migraine, consistent with history, normally has rare infrequent migraine Now acute episode may be related to sinusitis - Inadequately treated for migraine HA at home  Plan: 1. Start abortive therapy with Sumatriptan 50mg  tabs - take 1 PRN (#12, 0 refill due to quantity limit), severe HA, may repeat dose within 2 hr if persistent, no more in 24 hours, in future can  titrate dose to 50-100mg  if needed. Counseling on potential side effect / intolerance with chest discomfort acutely after taking sumatriptan 2. May also take NSAID / Excedrine/ Tylenol PRN 3. Avoid triggers including foods, caffeine. Important to rest. Return criteria given for acute migraine, when to go to office vs ED   Meds ordered this encounter  Medications  . SUMAtriptan (IMITREX) 50 MG tablet    Sig: Take 1 tablet (50 mg total) by mouth once as needed for up to 1 dose for migraine. May repeat one dose in 2 hours if headache persists, for max dose 24 hours    Dispense:  12 tablet    Refill:  2  . amoxicillin-clavulanate (AUGMENTIN) 875-125 MG tablet    Sig: Take 1 tablet by mouth 2 (two) times daily.    Dispense:  20 tablet    Refill:  0  . fluticasone (FLONASE) 50 MCG/ACT nasal spray    Sig: Place 2 sprays into both nostrils daily. Use for 4-6 weeks then stop and use seasonally or as needed.    Dispense:  16 g    Refill:  3  . predniSONE (DELTASONE) 20 MG tablet    Sig: Take daily with food. Start with 60mg  (3 pills) x 2 days, then reduce to 40mg  (2 pills) x 2 days, then 20mg  (1 pill)  x 3 days    Dispense:  13 tablet    Refill:  0    Follow-up: - Return in 1 week if unresolved  Patient verbalizes understanding with the above medical recommendations including the limitation of remote medical advice.  Specific follow-up and call-back criteria were given for patient to follow-up or seek medical care more urgently if needed.   - Time spent in direct consultation with patient on phone: 12 minutes   , DO [facility] 02/23/2021, 11:15 AM

## 2021-02-23 NOTE — Patient Instructions (Addendum)
   Please schedule a Follow-up Appointment to: Return if symptoms worsen or fail to improve.  If you have any other questions or concerns, please feel free to call the office or send a message through MyChart. You may also schedule an earlier appointment if necessary.  Additionally, you may be receiving a survey about your experience at our office within a few days to 1 week by e-mail or mail. We value your feedback.  Omkar Stratmann, DO [facility], [facility] 

## 2021-02-24 DIAGNOSIS — R112 Nausea with vomiting, unspecified: Secondary | ICD-10-CM

## 2021-02-24 MED ORDER — ONDANSETRON 4 MG PO TBDP: 4.0000 mg | ORAL_TABLET | Freq: Three times a day (TID) | ORAL | 0 refills | Status: DC | PRN

## 2021-02-24 NOTE — Addendum Note (Signed)
Addended by: Smitty Cords on: 02/24/2021 12:19 PM   Modules accepted: Orders

## 2022-01-26 ENCOUNTER — Other Ambulatory Visit: Payer: Self-pay

## 2022-01-26 ENCOUNTER — Emergency Department: Payer: Medicaid Other

## 2022-01-26 ENCOUNTER — Inpatient Hospital Stay
Admission: EM | Admit: 2022-01-26 | Discharge: 2022-02-08 | DRG: 193 | Disposition: A | Discharge status: Home / Self Care | Payer: Medicaid Other | Attending: Internal Medicine | Admitting: Internal Medicine

## 2022-01-26 ENCOUNTER — Encounter: Payer: Self-pay | Admitting: Emergency Medicine

## 2022-01-26 DIAGNOSIS — E1169 Type 2 diabetes mellitus with other specified complication: Secondary | ICD-10-CM

## 2022-01-26 DIAGNOSIS — G43909 Migraine, unspecified, not intractable, without status migrainosus: Secondary | ICD-10-CM | POA: Diagnosis present

## 2022-01-26 DIAGNOSIS — I10 Essential (primary) hypertension: Secondary | ICD-10-CM

## 2022-01-26 DIAGNOSIS — Z6838 Body mass index (BMI) 38.0-38.9, adult: Secondary | ICD-10-CM | POA: Diagnosis not present

## 2022-01-26 DIAGNOSIS — G5621 Lesion of ulnar nerve, right upper limb: Secondary | ICD-10-CM

## 2022-01-26 DIAGNOSIS — J90 Pleural effusion, not elsewhere classified: Secondary | ICD-10-CM | POA: Diagnosis not present

## 2022-01-26 DIAGNOSIS — Z7951 Long term (current) use of inhaled steroids: Secondary | ICD-10-CM

## 2022-01-26 DIAGNOSIS — J432 Centrilobular emphysema: Secondary | ICD-10-CM

## 2022-01-26 DIAGNOSIS — E871 Hypo-osmolality and hyponatremia: Secondary | ICD-10-CM | POA: Diagnosis not present

## 2022-01-26 DIAGNOSIS — F17200 Nicotine dependence, unspecified, uncomplicated: Secondary | ICD-10-CM | POA: Diagnosis not present

## 2022-01-26 DIAGNOSIS — F17210 Nicotine dependence, cigarettes, uncomplicated: Secondary | ICD-10-CM | POA: Diagnosis present

## 2022-01-26 DIAGNOSIS — J189 Pneumonia, unspecified organism: Secondary | ICD-10-CM | POA: Diagnosis not present

## 2022-01-26 DIAGNOSIS — E785 Hyperlipidemia, unspecified: Secondary | ICD-10-CM | POA: Diagnosis present

## 2022-01-26 DIAGNOSIS — I341 Nonrheumatic mitral (valve) prolapse: Secondary | ICD-10-CM | POA: Diagnosis present

## 2022-01-26 DIAGNOSIS — F419 Anxiety disorder, unspecified: Secondary | ICD-10-CM | POA: Diagnosis present

## 2022-01-26 DIAGNOSIS — G43009 Migraine without aura, not intractable, without status migrainosus: Secondary | ICD-10-CM | POA: Diagnosis not present

## 2022-01-26 DIAGNOSIS — K5900 Constipation, unspecified: Secondary | ICD-10-CM | POA: Diagnosis not present

## 2022-01-26 DIAGNOSIS — R059 Cough, unspecified: Secondary | ICD-10-CM | POA: Diagnosis not present

## 2022-01-26 DIAGNOSIS — F418 Other specified anxiety disorders: Secondary | ICD-10-CM

## 2022-01-26 DIAGNOSIS — M4722 Other spondylosis with radiculopathy, cervical region: Secondary | ICD-10-CM

## 2022-01-26 DIAGNOSIS — J9601 Acute respiratory failure with hypoxia: Secondary | ICD-10-CM

## 2022-01-26 DIAGNOSIS — J441 Chronic obstructive pulmonary disease with (acute) exacerbation: Secondary | ICD-10-CM | POA: Diagnosis not present

## 2022-01-26 DIAGNOSIS — E1165 Type 2 diabetes mellitus with hyperglycemia: Secondary | ICD-10-CM | POA: Diagnosis not present

## 2022-01-26 DIAGNOSIS — Z20822 Contact with and (suspected) exposure to covid-19: Secondary | ICD-10-CM | POA: Diagnosis present

## 2022-01-26 DIAGNOSIS — J984 Other disorders of lung: Secondary | ICD-10-CM | POA: Diagnosis not present

## 2022-01-26 DIAGNOSIS — R0602 Shortness of breath: Secondary | ICD-10-CM | POA: Diagnosis not present

## 2022-01-26 DIAGNOSIS — J439 Emphysema, unspecified: Secondary | ICD-10-CM | POA: Diagnosis present

## 2022-01-26 DIAGNOSIS — J9621 Acute and chronic respiratory failure with hypoxia: Secondary | ICD-10-CM | POA: Diagnosis not present

## 2022-01-26 DIAGNOSIS — J9690 Respiratory failure, unspecified, unspecified whether with hypoxia or hypercapnia: Secondary | ICD-10-CM | POA: Diagnosis not present

## 2022-01-26 DIAGNOSIS — J9811 Atelectasis: Secondary | ICD-10-CM | POA: Diagnosis not present

## 2022-01-26 DIAGNOSIS — K449 Diaphragmatic hernia without obstruction or gangrene: Secondary | ICD-10-CM | POA: Diagnosis not present

## 2022-01-26 DIAGNOSIS — R0689 Other abnormalities of breathing: Secondary | ICD-10-CM | POA: Diagnosis not present

## 2022-01-26 DIAGNOSIS — Z79899 Other long term (current) drug therapy: Secondary | ICD-10-CM

## 2022-01-26 DIAGNOSIS — E669 Obesity, unspecified: Secondary | ICD-10-CM

## 2022-01-26 DIAGNOSIS — R0902 Hypoxemia: Secondary | ICD-10-CM | POA: Diagnosis not present

## 2022-01-26 DIAGNOSIS — R069 Unspecified abnormalities of breathing: Secondary | ICD-10-CM | POA: Diagnosis not present

## 2022-01-26 DIAGNOSIS — Z794 Long term (current) use of insulin: Secondary | ICD-10-CM | POA: Diagnosis not present

## 2022-01-26 LAB — BASIC METABOLIC PANEL
Anion gap: 12 (ref 5–15)
BUN: 10 mg/dL (ref 6–20)
CO2: 23 mmol/L (ref 22–32)
Calcium: 9.3 mg/dL (ref 8.9–10.3)
Chloride: 102 mmol/L (ref 98–111)
Creatinine, Ser: 0.67 mg/dL (ref 0.44–1.00)
GFR, Estimated: >60 mL/min (ref >60)
Glucose, Bld: 181 mg/dL — ABNORMAL HIGH (ref 70–99)
Potassium: 3.7 mmol/L (ref 3.5–5.1)
Sodium: 137 mmol/L (ref 135–145)

## 2022-01-26 LAB — CBC
HCT: 45.2 % (ref 36.0–46.0)
Hemoglobin: 15.5 g/dL — ABNORMAL HIGH (ref 12.0–15.0)
MCH: 31.8 pg (ref 26.0–34.0)
MCHC: 34.3 g/dL (ref 30.0–36.0)
MCV: 92.8 fL (ref 80.0–100.0)
Platelets: 262 10*3/uL (ref 150–400)
RBC: 4.87 MIL/uL (ref 3.87–5.11)
RDW: 11.8 % (ref 11.5–15.5)
WBC: 7.7 10*3/uL (ref 4.0–10.5)
nRBC: 0 % (ref 0.0–0.2)

## 2022-01-26 LAB — TROPONIN I (HIGH SENSITIVITY): Troponin I (High Sensitivity): 8 ng/L (ref <18)

## 2022-01-26 LAB — RESP PANEL BY RT-PCR (FLU A&B, COVID) ARPGX2
Influenza A by PCR: NEGATIVE
Influenza B by PCR: NEGATIVE
SARS Coronavirus 2 by RT PCR: NEGATIVE

## 2022-01-26 MED ORDER — IPRATROPIUM-ALBUTEROL 0.5-2.5 (3) MG/3ML IN SOLN
3.0000 mL | Freq: Once | RESPIRATORY_TRACT | Status: AC
  Administered 2022-01-26: 3 mL via RESPIRATORY_TRACT
  Filled 2022-01-26: qty 3

## 2022-01-26 MED ORDER — DM-GUAIFENESIN ER 30-600 MG PO TB12
1.0000 | ORAL_TABLET | Freq: Two times a day (BID) | ORAL | Status: DC | PRN
Start: 2022-01-26 — End: 2022-02-08
  Administered 2022-01-27 – 2022-02-05 (×3): 1 via ORAL
  Filled 2022-01-26 (×3): qty 1

## 2022-01-26 MED ORDER — PREGABALIN 75 MG PO CAPS
75.0000 mg | ORAL_CAPSULE | Freq: Two times a day (BID) | ORAL | Status: DC
  Administered 2022-01-26 – 2022-02-08 (×26): 75 mg via ORAL
  Filled 2022-01-26 (×26): qty 1

## 2022-01-26 MED ORDER — BUPROPION HCL ER (XL) 150 MG PO TB24
150.0000 mg | ORAL_TABLET | Freq: Every day | ORAL | Status: DC
  Administered 2022-01-27 – 2022-02-08 (×13): 150 mg via ORAL
  Filled 2022-01-26 (×13): qty 1

## 2022-01-26 MED ORDER — ENOXAPARIN SODIUM 40 MG/0.4ML IJ SOSY
40.0000 mg | PREFILLED_SYRINGE | INTRAMUSCULAR | Status: DC
  Administered 2022-01-26 – 2022-02-07 (×13): 40 mg via SUBCUTANEOUS
  Filled 2022-01-26 (×13): qty 0.4

## 2022-01-26 MED ORDER — ONDANSETRON HCL 4 MG/2ML IJ SOLN
4.0000 mg | Freq: Three times a day (TID) | INTRAMUSCULAR | Status: DC | PRN
Start: 2022-01-26 — End: 2022-02-08

## 2022-01-26 MED ORDER — SODIUM CHLORIDE 0.9 % IV SOLN
500.0000 mg | INTRAVENOUS | Status: DC
  Administered 2022-01-26 – 2022-01-27 (×2): 500 mg via INTRAVENOUS
  Filled 2022-01-26 (×3): qty 5

## 2022-01-26 MED ORDER — IPRATROPIUM-ALBUTEROL 0.5-2.5 (3) MG/3ML IN SOLN
3.0000 mL | RESPIRATORY_TRACT | Status: DC
  Administered 2022-01-26 – 2022-01-28 (×10): 3 mL via RESPIRATORY_TRACT
  Filled 2022-01-26 (×10): qty 3

## 2022-01-26 MED ORDER — HYDRALAZINE HCL 20 MG/ML IJ SOLN: 5.0000 mg | INTRAMUSCULAR | Status: DC | PRN

## 2022-01-26 MED ORDER — SODIUM CHLORIDE 0.9 % IV SOLN
2.0000 g | INTRAVENOUS | Status: AC
  Administered 2022-01-26 – 2022-01-30 (×5): 2 g via INTRAVENOUS
  Filled 2022-01-26 (×4): qty 20
  Filled 2022-01-26: qty 2
  Filled 2022-01-26: qty 20

## 2022-01-26 MED ORDER — ALBUTEROL SULFATE (2.5 MG/3ML) 0.083% IN NEBU: 2.5000 mg | INHALATION_SOLUTION | RESPIRATORY_TRACT | Status: DC | PRN

## 2022-01-26 MED ORDER — CLONIDINE HCL ER 0.1 MG PO TB12
0.1000 mg | ORAL_TABLET | Freq: Every day | ORAL | Status: DC
Start: 2022-01-27 — End: 2022-02-03
  Administered 2022-01-27 – 2022-02-02 (×7): 0.1 mg via ORAL
  Filled 2022-01-26 (×8): qty 1

## 2022-01-26 MED ORDER — LISINOPRIL 10 MG PO TABS
5.0000 mg | ORAL_TABLET | Freq: Every day | ORAL | Status: DC
  Administered 2022-01-27 – 2022-02-03 (×8): 5 mg via ORAL
  Filled 2022-01-26 (×8): qty 1

## 2022-01-26 MED ORDER — NICOTINE 14 MG/24HR TD PT24
14.0000 mg | MEDICATED_PATCH | Freq: Every day | TRANSDERMAL | Status: DC
  Administered 2022-01-26 – 2022-02-08 (×14): 14 mg via TRANSDERMAL
  Filled 2022-01-26 (×14): qty 1

## 2022-01-26 MED ORDER — TRAZODONE HCL 100 MG PO TABS
300.0000 mg | ORAL_TABLET | Freq: Every day | ORAL | Status: DC
  Administered 2022-01-27 – 2022-02-07 (×12): 300 mg via ORAL
  Filled 2022-01-26 (×12): qty 3

## 2022-01-26 MED ORDER — MOMETASONE FURO-FORMOTEROL FUM 200-5 MCG/ACT IN AERO
2.0000 | INHALATION_SPRAY | Freq: Two times a day (BID) | RESPIRATORY_TRACT | Status: DC
  Administered 2022-01-26: 2 via RESPIRATORY_TRACT
  Filled 2022-01-26: qty 8.8

## 2022-01-26 MED ORDER — LAMOTRIGINE 25 MG PO TABS
25.0000 mg | ORAL_TABLET | Freq: Every day | ORAL | Status: DC
  Administered 2022-01-27 – 2022-02-08 (×13): 25 mg via ORAL
  Filled 2022-01-26 (×13): qty 1

## 2022-01-26 MED ORDER — ACETAMINOPHEN 325 MG PO TABS
650.0000 mg | ORAL_TABLET | Freq: Four times a day (QID) | ORAL | Status: DC | PRN
  Administered 2022-01-26 – 2022-01-27 (×2): 650 mg via ORAL
  Filled 2022-01-26 (×3): qty 2

## 2022-01-26 MED ORDER — DULOXETINE HCL 30 MG PO CPEP
120.0000 mg | ORAL_CAPSULE | Freq: Every day | ORAL | Status: DC
  Administered 2022-01-27 – 2022-02-08 (×13): 120 mg via ORAL
  Filled 2022-01-26 (×13): qty 4

## 2022-01-26 MED ORDER — METHYLPREDNISOLONE SODIUM SUCC 125 MG IJ SOLR
60.0000 mg | Freq: Two times a day (BID) | INTRAMUSCULAR | Status: DC
  Administered 2022-01-27 – 2022-01-29 (×5): 60 mg via INTRAVENOUS
  Filled 2022-01-26 (×5): qty 2

## 2022-01-26 MED ORDER — HYDROCOD POLI-CHLORPHE POLI ER 10-8 MG/5ML PO SUER
5.0000 mL | Freq: Two times a day (BID) | ORAL | Status: DC
  Administered 2022-01-26 – 2022-01-31 (×10): 5 mL via ORAL
  Filled 2022-01-26 (×10): qty 5

## 2022-01-26 MED ORDER — METHYLPREDNISOLONE SODIUM SUCC 125 MG IJ SOLR
125.0000 mg | INTRAMUSCULAR | Status: AC
  Administered 2022-01-26: 125 mg via INTRAVENOUS
  Filled 2022-01-26: qty 2

## 2022-01-26 MED ORDER — ACETAMINOPHEN 500 MG PO TABS
1000.0000 mg | ORAL_TABLET | Freq: Once | ORAL | Status: AC
  Administered 2022-01-26: 1000 mg via ORAL
  Filled 2022-01-26: qty 2

## 2022-01-26 MED ORDER — SUMATRIPTAN SUCCINATE 50 MG PO TABS
50.0000 mg | ORAL_TABLET | Freq: Once | ORAL | Status: DC | PRN
  Administered 2022-01-28 – 2022-02-07 (×6): 50 mg via ORAL
  Filled 2022-01-26 (×7): qty 1

## 2022-01-26 NOTE — Assessment & Plan Note (Addendum)
As needed sumatriptan ?Continue home Lamictal ?

## 2022-01-26 NOTE — Assessment & Plan Note (Deleted)
Due to CAP and COPD exacerbation.  Patient does not meet criteria for sepsis. ? ?- Will admit to med-surg bed as inpt ?- IV Rocephin and azithromycin ?- Incentive spirometry ?- Solu-Medrol 60 mg twice daily ?- Mucinex for cough  ?- Bronchodilators ?- Urine legionella and S. pneumococcal antigen ?- Follow up blood culture x2, sputum culture ? ?

## 2022-01-26 NOTE — H&P (Signed)
?History and Physical  ? ? ?NECOLA TROLL [id number] DOB: August 03, 1966 DOA: 01/26/2022 ? ?Referring MD/NP/PA:  ? ?PCP: Olin Hauser, DO  ? ?Patient coming from:  The patient is coming from home.  At baseline, pt is independent for most of ADL.       ? ?Chief Complaint: Shortness of breath ? ?HPI: Christy Mayer is a 56 y.o. female with medical history significant of COPD, asthma, not on oxygen normally, hypertension, hyperlipidemia, depression with anxiety, mitral valve prolapse, tobacco abuse, migraine headaches, obesity, who presents with shortness breath. ? ?Patient states that she has shortness of breath for more than 4 days, which has been progressively worsening.  Patient has dry cough, wheezing, no fever or chills.  Patient states that she does not have chest pain at rest, but coughing induces some sharp front chest pain.  No nausea, vomiting, diarrhea or abdominal pain.  No symptoms of UTI. ? ?Patient was found to have oxygen desaturation to 88% on room air, which improved to 94% on 3 L oxygen.  Patient has severe dry cough, with acute respiratory distress, can barely speak in full sentence. ? ? ?Data Reviewed and ED Course: pt was found to have WBC 7.7, troponin level 8, negative COVID PCR, GFR> 60, temperature 99.3, blood pressure 130/72, heart rate 92, RR 19.  Chest x-ray showed right lower lobe infiltration.  Patient is admitted to telemetry bed as inpatient ? ? ?EKG:  Not done in ED, will get one.   ? ? ?Review of Systems:  ? ?General: no fevers, chills, no body weight gain, has fatigue ?HEENT: no blurry vision, hearing changes or sore throat ?Respiratory: has dyspnea, coughing, wheezing ?CV: has chest pain, no palpitations ?GI: no nausea, vomiting, abdominal pain, diarrhea, constipation ?GU: no dysuria, burning on urination, increased urinary frequency, hematuria  ?Ext: no leg edema ?Neuro: no unilateral weakness, numbness, or tingling, no vision change or hearing loss ?Skin: no rash,  no skin tear. ?MSK: No muscle spasm, no deformity, no limitation of range of movement in spin ?Heme: No easy bruising.  ?Travel history: No recent long distant travel. ? ? ?Allergy:  ?Allergies  ?Allergen Reactions  ? Aspirin Nausea Only  ? ? ?Past Medical History:  ?Diagnosis Date  ? Arthritis   ? Asthma   ? Back pain   ? COPD (chronic obstructive pulmonary disease) ([place])   ? Emphysema lung ([place])   ? Heart palpitations   ? Miscarriage   ? Mitral valve prolapse   ? ? ?Past Surgical History:  ?Procedure Laterality Date  ? CESAREAN SECTION  11/2007  ? ? ?Social History:  reports that she has been smoking cigarettes. She has a 15.00 pack-year smoking history. She has never used smokeless tobacco. She reports current alcohol use. She reports that she does not use drugs. ? ?Family History:  ?Family History  ?Adopted: Yes  ?  ? ?Prior to Admission medications   ?Medication Sig Start Date End Date Taking? Authorizing Provider  ?albuterol (VENTOLIN HFA) 108 (90 Base) MCG/ACT inhaler TAKE 2 PUFFS BY MOUTH EVERY 6 HOURS AS NEEDED FOR WHEEZE OR SHORTNESS OF BREATH 12/04/20   Olin Hauser, DO  ?amoxicillin-clavulanate (AUGMENTIN) 875-125 MG tablet Take 1 tablet by mouth 2 (two) times daily. 02/23/21   Karamalegos, Devonne Doughty, DO  ?budesonide-formoterol (SYMBICORT) 160-4.5 MCG/ACT inhaler Inhale 2 puffs into the lungs daily. Need future visit for refills 01/18/21   Olin Hauser, DO  ?buPROPion (WELLBUTRIN XL) 150 MG 24 hr tablet  Take 1 tablet by mouth daily. 12/27/18   [provider]  ?cloNIDine HCl (KAPVAY) 0.1 MG TB12 ER tablet Take 1 tablet by mouth daily. 12/27/18   [provider]  ?DULoxetine (CYMBALTA) 60 MG capsule Take 2 capsules by mouth daily.  12/27/18   [provider]  ?fluticasone (FLONASE) 50 MCG/ACT nasal spray Place 2 sprays into both nostrils daily. Use for 4-6 weeks then stop and use seasonally or as needed. 02/23/21   Karamalegos, Devonne Doughty, DO  ?lamoTRIgine  (LAMICTAL) 25 MG tablet Take 25 mg by mouth daily. 10/20/20   [provider]  ?lisinopril (ZESTRIL) 5 MG tablet TAKE 1 TABLET BY MOUTH EVERY DAY 11/15/19   Parks Ranger, Devonne Doughty, DO  ?ondansetron (ZOFRAN ODT) 4 MG disintegrating tablet Take 1 tablet (4 mg total) by mouth every 8 (eight) hours as needed for nausea or vomiting. 02/24/21   Parks Ranger, Devonne Doughty, DO  ?predniSONE (DELTASONE) 20 MG tablet Take daily with food. Start with 60mg  (3 pills) x 2 days, then reduce to 40mg  (2 pills) x 2 days, then 20mg  (1 pill) x 3 days 02/23/21   Olin Hauser, DO  ?pregabalin (LYRICA) 75 MG capsule Take 1 capsule (75 mg total) by mouth 2 (two) times daily. 09/18/18   Karamalegos, Devonne Doughty, DO  ?SUMAtriptan (IMITREX) 50 MG tablet Take 1 tablet (50 mg total) by mouth once as needed for up to 1 dose for migraine. May repeat one dose in 2 hours if headache persists, for max dose 24 hours 02/23/21   Karamalegos, Devonne Doughty, DO  ?traZODone (DESYREL) 50 MG tablet Take 300 mg by mouth at bedtime.  08/02/18   [provider]  ? ? ?Physical Exam: ?Vitals:  ? 01/26/22 1413 01/26/22 1430 01/26/22 1500 01/26/22 1711  ?BP:  112/64 130/72 133/76  ?Pulse:  86 91 95  ?Resp:  19 17 20   ?Temp: 99.3 ?F (37.4 ?C)   98.2 ?F (36.8 ?C)  ?TempSrc: Oral   Oral  ?SpO2:  93% 91% 90%  ?Weight:      ?Height:      ? ?General: in respiratory acute distress ?HEENT: ?      Eyes: PERRL, EOMI, no scleral icterus. ?      ENT: No discharge from the ears and nose, no pharynx injection, no tonsillar enlargement.  ?      Neck: No JVD, no bruit, no mass felt. ?Heme: No neck lymph node enlargement. ?Cardiac: S1/S2, RRR, No gallops or rubs. ?Respiratory: Has diffused rhonchi bilaterally, has wheezing bilaterally ?GI: Soft, nondistended, nontender, no rebound pain, no organomegaly, BS present. ?GU: No hematuria ?Ext: No pitting leg edema bilaterally. 1+DP/PT pulse bilaterally. ?Musculoskeletal: No joint deformities, No joint redness or  warmth, no limitation of ROM in spin. ?Skin: No rashes.  ?Neuro: Alert, oriented X3, cranial nerves II-XII grossly intact, moves all extremities normally.  ?Psych: Patient is not psychotic, no suicidal or hemocidal ideation. ? ?Labs on Admission: I have personally reviewed following labs and imaging studies ? ?CBC: ?Recent Labs  ?Lab 01/26/22 ?1324  ?WBC 7.7  ?HGB 15.5*  ?HCT 45.2  ?MCV 92.8  ?PLT 262  ? ?Basic Metabolic Panel: ?Recent Labs  ?Lab 01/26/22 ?1324  ?NA 137  ?K 3.7  ?CL 102  ?CO2 23  ?GLUCOSE 181*  ?BUN 10  ?CREATININE 0.67  ?CALCIUM 9.3  ? ?GFR: ?Estimated Creatinine Clearance: 96.4 mL/min (by C-G formula based on SCr of 0.67 mg/dL). ?Liver Function Tests: ?No results for input(s): AST, ALT, ALKPHOS,  BILITOT, PROT, ALBUMIN in the last 168 hours. ?No results for input(s): LIPASE, AMYLASE in the last 168 hours. ?No results for input(s): AMMONIA in the last 168 hours. ?Coagulation Profile: ?No results for input(s): INR, PROTIME in the last 168 hours. ?Cardiac Enzymes: ?No results for input(s): CKTOTAL, CKMB, CKMBINDEX, TROPONINI in the last 168 hours. ?BNP (last 3 results) ?No results for input(s): PROBNP in the last 8760 hours. ?HbA1C: ?No results for input(s): HGBA1C in the last 72 hours. ?CBG: ?No results for input(s): GLUCAP in the last 168 hours. ?Lipid Profile: ?No results for input(s): CHOL, HDL, LDLCALC, TRIG, CHOLHDL, LDLDIRECT in the last 72 hours. ?Thyroid Function Tests: ?No results for input(s): TSH, T4TOTAL, FREET4, T3FREE, THYROIDAB in the last 72 hours. ?Anemia Panel: ?No results for input(s): VITAMINB12, FOLATE, FERRITIN, TIBC, IRON, RETICCTPCT in the last 72 hours. ?Urine analysis: ?   ?Component Value Date/Time  ? [place] YELLOW 08/17/2013 1550  ? APPEARANCEUR CLOUDY (A) 08/17/2013 1550  ? LABSPEC 1.026 08/17/2013 1550  ? PHURINE 5.5 08/17/2013 1550  ? GLUCOSEU NEGATIVE 08/17/2013 1550  ? [place] NEGATIVE 08/17/2013 1550  ? [place] NEGATIVE 08/17/2013 1550  ? [place] NEGATIVE  08/17/2013 1550  ? PROTEINUR NEGATIVE 08/17/2013 1550  ? UROBILINOGEN 0.2 08/17/2013 1550  ? NITRITE NEGATIVE 08/17/2013 1550  ? LEUKOCYTESUR TRACE (A) 08/17/2013 1550  ? ?Sepsis Labs: ?[email or web address] (procalcitoni

## 2022-01-26 NOTE — Assessment & Plan Note (Addendum)
BMI 38.72 ? ? ?

## 2022-01-26 NOTE — ED Notes (Signed)
Pt to ED via [facility] from home for shortness of breath x 4-5 days. EMS reports that upon Fire's arrival patients SpO2 was 91% RA, was placed on 2 liters of O2 via [place]. Pts last SpO2 was 94% on 2 liters via [place]. Pt does have hx/o emphysema but is not currently being treated for it. Pt was given 1 duoneb,125 mg solumedrol, and 4 mg zofran in route. Pt has 20 G IV in the left hand. Pt is currently in NAD.  ?

## 2022-01-26 NOTE — ED Notes (Signed)
Admission MD at bedside.  

## 2022-01-26 NOTE — Assessment & Plan Note (Addendum)
With blood pressure on the lower side hold lisinopril ?

## 2022-01-26 NOTE — ED Provider Notes (Signed)
Procedures ? ?  ? ?----------------------------------------- ?3:21 PM on 01/26/2022 ?----------------------------------------- ? ?Patient reassessed.  Still hypoxic on room air with saturation of 87%.  Requiring 3 L nasal cannula.  Still has expiratory wheezing, will give a dose of Solu-Medrol IV in addition to IV Rocephin and azithromycin.  We will need to admit for pneumonia and acute respiratory failure. ? ?Other vital signs are unremarkable and patient is not septic. ?  ?Sharman Cheek, MD ?01/27/22 1655 ? ?

## 2022-01-26 NOTE — Assessment & Plan Note (Addendum)
-   Continue home medications 

## 2022-01-26 NOTE — ED Provider Notes (Signed)
? ?[facility] ?Provider Note ? ? ? Event Date/Time  ? First MD Initiated Contact with Patient 01/26/22 1349   ?  (approximate) ? ? ?History  ? ?Shortness of Breath and Cough ? ? ?HPI ? ?Christy Mayer is a 56 y.o. female who is a smoker and has history of COPD.  Presents emergency department via EMS for shortness of breath.  Patient states she has had a cough and some shortness of breath since Thursday of last week, 4 to 5 days ago.  States she has a nonproductive cough that sounds very wet.  EMS gave her a DuoNeb and 125 mg of Solu-Medrol in route.  Patient does not wear oxygen at home and was placed on 3 L via EMS.  States that she was at 88% on 3 L during triage.  Patient does use inhalers, she has Symbicort and albuterol.  States she does not use them unless she is sick.  She denies chest pain. ? ?  ? ? ?Physical Exam  ? ?Triage Vital Signs: ?ED Triage Vitals  ?Enc Vitals Group  ?   BP 01/26/22 1357 135/68  ?   Pulse Rate 01/26/22 1337 92  ?   Resp 01/26/22 1337 19  ?   Temp --   ?   Temp src --   ?   SpO2 01/26/22 1337 91 %  ?   Weight 01/26/22 1322 225 lb 1.4 oz (102.1 kg)  ?   Height 01/26/22 1322 5\' 7"  (1.702 m)  ?   Head Circumference --   ?   Peak Flow --   ?   Pain Score 01/26/22 1322 8  ?   Pain Loc --   ?   Pain Edu? --   ?   Excl. in [place]? --   ? ? ?Most recent vital signs: ?Vitals:  ? 01/26/22 1430 01/26/22 1500  ?BP: 112/64 130/72  ?Pulse: 86 91  ?Resp: 19 17  ?Temp:    ?SpO2: 93% 91%  ? ? ? ?General: Awake, no distress.   ?CV:  Good peripheral perfusion. regular rate and  rhythm ?Resp:  Normal effort. Lungs with diffuse wheezing, cough is wet ?Abd:  No distention.   ?Other:    ? ? ?ED Results / Procedures / Treatments  ? ?Labs ?(all labs ordered are listed, but only abnormal results are displayed) ?Labs Reviewed  ?BASIC METABOLIC PANEL - Abnormal; Notable for the following components:  ?    Result Value  ? Glucose, Bld 181 (*)   ? All other components within normal limits  ?CBC  - Abnormal; Notable for the following components:  ? Hemoglobin 15.5 (*)   ? All other components within normal limits  ?RESP PANEL BY RT-PCR (FLU A&B, COVID) [id number]  ?CULTURE, BLOOD (ROUTINE X 2)  ?CULTURE, BLOOD (ROUTINE X 2)  ?EXPECTORATED SPUTUM ASSESSMENT W GRAM STAIN, RFLX TO RESP C  ?HIV ANTIBODY (ROUTINE TESTING W REFLEX)  ?STREP PNEUMONIAE URINARY ANTIGEN  ?LEGIONELLA PNEUMOPHILA SEROGP 1 UR AG  ?TROPONIN I (HIGH SENSITIVITY)  ? ? ? ?EKG ? ? ? ? ?RADIOLOGY ?Chest x-ray ? ? ? ?PROCEDURES: ? ? ?Marland KitchenCritical Care E&M ?Performed by: Versie Starks, PA-C ? ?Critical care provider statement:  ?  Critical care time (minutes):  45 ?  Critical care time was exclusive of:  Separately billable procedures and treating other patients ?  Critical care was necessary to treat or prevent imminent or life-threatening deterioration of the following conditions:  Respiratory  failure ?  Critical care was time spent personally by me on the following activities:  Development of treatment plan with patient or surrogate, evaluation of patient's response to treatment, examination of patient, obtaining history from patient or surrogate, ordering and performing treatments and interventions, ordering and review of laboratory studies, ordering and review of radiographic studies, pulse oximetry, re-evaluation of patient's condition and review of old charts ?After initial E/M assessment, critical care services were subsequently performed that were exclusive of separately billable procedures or treatment.  ? ? ? ?MEDICATIONS ORDERED IN ED: ?Medications  ?nicotine (NICODERM CQ - dosed in mg/24 hours) patch 14 mg (14 mg Transdermal Patch Applied 01/26/22 1412)  ?cefTRIAXone (ROCEPHIN) 2 g in sodium chloride 0.9 % 100 mL IVPB (has no administration in time range)  ?azithromycin (ZITHROMAX) 500 mg in sodium chloride 0.9 % 250 mL IVPB (500 mg Intravenous New Bag/Given 01/26/22 1534)  ?ipratropium-albuterol (DUONEB) 0.5-2.5 (3) MG/3ML nebulizer  solution 3 mL (has no administration in time range)  ?albuterol (PROVENTIL) (2.5 MG/3ML) 0.083% nebulizer solution 2.5 mg (has no administration in time range)  ?dextromethorphan-guaiFENesin (MUCINEX DM) 30-600 MG per 12 hr tablet 1 tablet (has no administration in time range)  ?methylPREDNISolone sodium succinate (SOLU-MEDROL) 125 mg/2 mL injection 60 mg (has no administration in time range)  ?ondansetron (ZOFRAN) injection 4 mg (has no administration in time range)  ?acetaminophen (TYLENOL) tablet 650 mg (has no administration in time range)  ?hydrALAZINE (APRESOLINE) injection 5 mg (has no administration in time range)  ?enoxaparin (LOVENOX) injection 40 mg (has no administration in time range)  ?ipratropium-albuterol (DUONEB) 0.5-2.5 (3) MG/3ML nebulizer solution 3 mL (3 mLs Nebulization Given 01/26/22 1412)  ?ipratropium-albuterol (DUONEB) 0.5-2.5 (3) MG/3ML nebulizer solution 3 mL (3 mLs Nebulization Given 01/26/22 1412)  ?methylPREDNISolone sodium succinate (SOLU-MEDROL) 125 mg/2 mL injection 125 mg (125 mg Intravenous Given 01/26/22 1523)  ?acetaminophen (TYLENOL) tablet 1,000 mg (1,000 mg Oral Given 01/26/22 1523)  ? ? ? ?IMPRESSION / MDM / ASSESSMENT AND PLAN / ED COURSE  ?I reviewed the triage vital signs and the nursing notes. ?             ?               ? ?Differential diagnosis includes, but is not limited to, acute respiratory distress, covid, CAP, hypoxia, PE, MI ? ?Chest x-ray was independently reviewed by me shows a right middle lobe abnormality.  Read as possible collapse or consolidation by radiology. ? ?On reexamination of the patient she continues to be hypoxic.  She was given 2 DuoNebs.  Placed on O2 in the ED. ? ?This is a new O2 requirement.  Patient will need to be admitted. ? ?Labs are reassuring, CBC basic metabolic panel and troponin are all normal, respiratory panel is negative ? ?Care transferred to Dr. Joni Fears. ? ? ? ? ?  ? ? ?FINAL CLINICAL IMPRESSION(S) / ED DIAGNOSES  ? ?Final  diagnoses:  ?Acute respiratory failure with hypoxia ([place])  ?COPD exacerbation ([place])  ? ? ? ?Rx / DC Orders  ? ?ED Discharge Orders   ? ? None  ? ?  ? ? ? ?Note:  This document was prepared using Dragon voice recognition software and may include unintentional dictation errors. ? ?  ?Versie Starks, PA-C ?01/26/22 1638 ? ?  ?Lucrezia Starch, MD ?01/26/22 1802 ? ?

## 2022-01-26 NOTE — ED Triage Notes (Signed)
Pt comes into the ED via [facility] from home c/o cough and SHOB. Pt has a h/o emphysema and normally does not require any O2.  88% on 3L upon triage assessment.  Pt given duoneb and solumedrol in route with EMS.  Pt has strong cough that is non-productive.  PT states symptoms have been ongoing x 4-5 days.  ?

## 2022-01-26 NOTE — ED Notes (Signed)
Patient transported to X-ray 

## 2022-01-26 NOTE — Assessment & Plan Note (Addendum)
Restarted antibiotics with Zosyn and doxycycline starting on 02/02/2022 for 5 days (today will be the last day). ?

## 2022-01-26 NOTE — Assessment & Plan Note (Addendum)
Continue Solu-Medrol 40 mg daily today.  Continue nebulizer treatments.  Continue incentive spirometry.  The patient is starting to move better air.  Continue moving around and ambulating as much as possible. ?

## 2022-01-27 ENCOUNTER — Encounter: Payer: Self-pay | Admitting: Family Medicine

## 2022-01-27 DIAGNOSIS — J9621 Acute and chronic respiratory failure with hypoxia: Secondary | ICD-10-CM | POA: Diagnosis not present

## 2022-01-27 LAB — CBC
HCT: 42.5 % (ref 36.0–46.0)
Hemoglobin: 14.4 g/dL (ref 12.0–15.0)
MCH: 32 pg (ref 26.0–34.0)
MCHC: 33.9 g/dL (ref 30.0–36.0)
MCV: 94.4 fL (ref 80.0–100.0)
Platelets: 236 10*3/uL (ref 150–400)
RBC: 4.5 MIL/uL (ref 3.87–5.11)
RDW: 11.8 % (ref 11.5–15.5)
WBC: 8.9 10*3/uL (ref 4.0–10.5)
nRBC: 0 % (ref 0.0–0.2)

## 2022-01-27 LAB — HIV ANTIBODY (ROUTINE TESTING W REFLEX): HIV Screen 4th Generation wRfx: NONREACTIVE

## 2022-01-27 MED ORDER — ARFORMOTEROL TARTRATE 15 MCG/2ML IN NEBU
15.0000 ug | INHALATION_SOLUTION | Freq: Two times a day (BID) | RESPIRATORY_TRACT | Status: DC
  Administered 2022-01-27 – 2022-02-08 (×25): 15 ug via RESPIRATORY_TRACT
  Filled 2022-01-27 (×31): qty 2

## 2022-01-27 MED ORDER — BUDESONIDE 0.25 MG/2ML IN SUSP
0.2500 mg | Freq: Two times a day (BID) | RESPIRATORY_TRACT | Status: DC
  Administered 2022-01-27 – 2022-02-08 (×25): 0.25 mg via RESPIRATORY_TRACT
  Filled 2022-01-27 (×25): qty 2

## 2022-01-27 MED ORDER — NICOTINE POLACRILEX 2 MG MT GUM
2.0000 mg | CHEWING_GUM | OROMUCOSAL | Status: DC | PRN
  Administered 2022-01-27 – 2022-01-28 (×3): 2 mg via ORAL
  Filled 2022-01-27 (×5): qty 1

## 2022-01-27 MED ORDER — ALPRAZOLAM 0.25 MG PO TABS
0.2500 mg | ORAL_TABLET | Freq: Two times a day (BID) | ORAL | Status: DC | PRN
  Administered 2022-01-27 – 2022-02-07 (×21): 0.25 mg via ORAL
  Filled 2022-01-27 (×21): qty 1

## 2022-01-27 MED ORDER — BENZONATATE 100 MG PO CAPS
200.0000 mg | ORAL_CAPSULE | Freq: Three times a day (TID) | ORAL | Status: DC
  Administered 2022-01-27 – 2022-02-08 (×36): 200 mg via ORAL
  Filled 2022-01-27 (×37): qty 2

## 2022-01-27 MED ORDER — HYDROCOD POLI-CHLORPHE POLI ER 10-8 MG/5ML PO SUER
5.0000 mL | Freq: Two times a day (BID) | ORAL | Status: DC | PRN
  Administered 2022-01-30 – 2022-02-06 (×11): 5 mL via ORAL
  Filled 2022-01-27 (×13): qty 5

## 2022-01-27 NOTE — TOC Initial Note (Signed)
Transition of Care (TOC) - Initial/Assessment Note  ? ? ?Patient Details  ?Name: Christy Mayer ?MRN: [id number] ?Date of Birth: December 23, 1965 ? ?Transition of Care (TOC) CM/SW Contact:    ?Chapman Fitch, RN ?Phone Number: ?01/27/2022, 2:47 PM ? ?Clinical Narrative:                 ? ? ?  ?Transition of Care (TOC) Screening Note ? ? ?Patient Details  ?Name: Christy Mayer ?Date of Birth: 11-03-1965 ? ? ?Transition of Care (TOC) CM/SW Contact:    ?Chapman Fitch, RN ?Phone Number: ?01/27/2022, 2:58 PM ? ? ? ?Transition of Care Department [facility]) has reviewed patient and no TOC needs have been identified at this time. We will continue to monitor patient advancement through interdisciplinary progression rounds. If new patient transition needs arise, please place a TOC consult. ? ? ?Patient currently on acute O2.  Please consult TOC if home O2 required ?  ? ? ?Patient Goals and CMS Choice ?  ?  ?  ? ?Expected Discharge Plan and Services ?  ?  ?  ?  ?  ?                ?  ?  ?  ?  ?  ?  ?  ?  ?  ?  ? ?Prior Living Arrangements/Services ?  ?  ?  ?       ?  ?  ?  ?  ? ?Activities of Daily Living ?  ?  ? ?Permission Sought/Granted ?  ?  ?   ?   ?   ?   ? ?Emotional Assessment ?  ?  ?  ?  ?  ?  ? ?Admission diagnosis:  COPD exacerbation (HCC) [J44.1] ?Acute respiratory failure with hypoxia (HCC) [J96.01] ?Acute on chronic respiratory failure with hypoxia (HCC) [J96.21] ?Patient Active Problem List  ? Diagnosis Date Noted  ? Acute on chronic respiratory failure with hypoxia (HCC) 01/26/2022  ? Obesity (BMI 30-39.9) 01/26/2022  ? CAP (community acquired pneumonia) 01/26/2022  ? COPD exacerbation (HCC) 01/26/2022  ? Depression with anxiety 01/26/2022  ? HTN (hypertension) 01/26/2022  ? Migraine headache 02/23/2021  ? Elevated BP without diagnosis of hypertension 09/18/2018  ? 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 Osteoarthritis of spine with radiculopathy, cervical region 08/20/2018  ? Gastroesophageal reflux  disease 08/06/2018  ? Elevated hemoglobin A1c 07/30/2018  ? Mixed hyperlipidemia 07/30/2018  ? Osteoarthritis, multiple sites 07/19/2016  ? Major depression, recurrent, chronic (HCC) 06/30/2016  ? Chronic left-sided low back pain with left-sided sciatica 06/30/2016  ? Centrilobular emphysema (HCC) 06/30/2016  ? Obesity (BMI 35.0-39.9 without comorbidity) 06/30/2016  ? GAD (generalized anxiety disorder) 09/17/2015  ? 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 Tobacco use disorder 01/05/2015  ? Dyspnea 01/05/2015  ? ?PCP:  Smitty Cords, DO ?Pharmacy:   ?[id number] - [place], [place] - [address] ?[address] ?[place] [address] ?Phone: [phone] Fax: [phone] ? ? ? ? ?Social Determinants of Health (SDOH) Interventions ?  ? ?Readmission Risk Interventions ?   ? View : No data to display.  ?  ?  ?  ? ? ? ?

## 2022-01-27 NOTE — Progress Notes (Signed)
?PROGRESS NOTE ? ? ? ?Christy Mayer  QHU:[id number] DOB: 1966-07-19 DOA: 01/26/2022 ?PCP: Christy Cords, DO  ? ? ?Brief Narrative:  ? 56 y.o. female with medical history significant of COPD, asthma, not on oxygen normally, hypertension, hyperlipidemia, depression with anxiety, mitral valve prolapse, tobacco abuse, migraine headaches, obesity, who presents with shortness breath. ?  ?Patient states that she has shortness of breath for more than 4 days, which has been progressively worsening.  Patient has dry cough, wheezing, no fever or chills.  Patient states that she does not have chest pain at rest, but coughing induces some sharp front chest pain.  No nausea, vomiting, diarrhea or abdominal pain.  No symptoms of UTI. ?  ?Patient was found to have oxygen desaturation to 88% on room air, which improved to 94% on 3 L oxygen.  Patient has severe dry cough, with acute respiratory distress ? ?Respiratory status is improving.  Patient remains on 3 L.  Work of breathing is improved.  Still with significant dry cough ? ? ?Assessment & Plan: ?  ?Principal Problem: ?  Acute on chronic respiratory failure with hypoxia (HCC) ?Active Problems: ?  CAP (community acquired pneumonia) ?  COPD exacerbation (HCC) ?  Tobacco use disorder ?  Migraine headache ?  Obesity (BMI 30-39.9) ?  Depression with anxiety ?  HTN (hypertension) ? ?* Acute on chronic respiratory failure with hypoxia (HCC) ?Due to CAP and COPD exacerbation.  Patient does not meet criteria for sepsis. ?Plan: ?Solu-Medrol 60 mg IV twice daily ?DuoNebs every 4 hours ?Twice daily Brovana ?Twice daily Pulmicort ?IV Rocephin azithromycin ?Incentive spirometry ?Supplemental oxygen, wean as tolerated ?Follow infectious panel ?Monitor vitals and fever curve ?  ?  ?COPD exacerbation (HCC) ?See above ?  ?CAP (community acquired pneumonia) ?See above ?  ?HTN (hypertension) ?- IV hydralazine as needed ?-Continue home clonidine, lisinopril ?  ?Depression with  anxiety ?- Continue home medications ?-As needed Xanax added ?  ?Obesity (BMI 30-39.9) ?BMI 35.25, body weight 102kg. BMI >35 + 1 comorbidity of hypertension --> pt has morbid obesity.  ?-Diet and exercise.   ?-Encouraged to lose weight. ?  ?  ?  ?Migraine headache ?- As needed sumatriptan ?-Continue home Lamictal ?  ?Tobacco use disorder ?Tobacco abuse: ?-Did counseling about the importance of quitting drinking ?-Smoking cessation advised ?-CIWA protocol ? ? ?DVT prophylaxis: SQ Lovenox ?Code Status: Full ?Family Communication: None today ?Disposition Plan: Status is: Inpatient ?Remains inpatient appropriate because: Acute hypoxic respiratory failure secondary to COPD/CAP. ? ? ?Level of care: Med-Surg ? ?Consultants:  ?None ? ?Procedures:  ?None ? ?Antimicrobials: ?Ceftriaxone ?Azithromycin ? ? ?Subjective: ?Patient seen and examined.  Work of breathing is improved from prior.  Still with significant cough and shortness of breath ? ?Objective: ?Vitals:  ? 01/27/22 0824 01/27/22 0857 01/27/22 1038 01/27/22 1043  ?BP:  (!) 163/80    ?Pulse:  (!) 101    ?Resp:  18    ?Temp:  97.9 ?F (36.6 ?C)    ?TempSrc:  Oral    ?SpO2: 93% 92% 92% 92%  ?Weight:      ?Height:      ? ? ?Intake/Output Summary (Last 24 hours) at 01/27/2022 1050 ?Last data filed at 01/27/2022 0500 ?Gross per 24 hour  ?Intake --  ?Output 1100 ml  ?Net -1100 ml  ? ?Filed Weights  ? 01/26/22 1322  ?Weight: 102.1 kg  ? ? ?Examination: ? ?General exam: Appears anxious ?Respiratory system: Diffuse rhonchi bilaterally.  End expiratory wheeze.  Normal work of breathing.  3 L ?Cardiovascular system: S1-S2, RRR, no murmurs, no pedal edema ?Gastrointestinal system: Obese, soft, NT/ND, normal bowel sounds ?Central nervous system: Alert and oriented. No focal neurological deficits. ?Extremities: Symmetric 5 x 5 power. ?Skin: No rashes, lesions or ulcers ?Psychiatry: Judgement and insight appear normal. Mood & affect appropriate.  ? ? ? ?Data Reviewed: I have personally  reviewed following labs and imaging studies ? ?CBC: ?Recent Labs  ?Lab 01/26/22 ?1324 01/27/22 ?0342  ?WBC 7.7 8.9  ?HGB 15.5* 14.4  ?HCT 45.2 42.5  ?MCV 92.8 94.4  ?PLT 262 236  ? ?Basic Metabolic Panel: ?Recent Labs  ?Lab 01/26/22 ?1324  ?NA 137  ?K 3.7  ?CL 102  ?CO2 23  ?GLUCOSE 181*  ?BUN 10  ?CREATININE 0.67  ?CALCIUM 9.3  ? ?GFR: ?Estimated Creatinine Clearance: 96.4 mL/min (by C-G formula based on SCr of 0.67 mg/dL). ?Liver Function Tests: ?No results for input(s): AST, ALT, ALKPHOS, BILITOT, PROT, ALBUMIN in the last 168 hours. ?No results for input(s): LIPASE, AMYLASE in the last 168 hours. ?No results for input(s): AMMONIA in the last 168 hours. ?Coagulation Profile: ?No results for input(s): INR, PROTIME in the last 168 hours. ?Cardiac Enzymes: ?No results for input(s): CKTOTAL, CKMB, CKMBINDEX, TROPONINI in the last 168 hours. ?BNP (last 3 results) ?No results for input(s): PROBNP in the last 8760 hours. ?HbA1C: ?No results for input(s): HGBA1C in the last 72 hours. ?CBG: ?No results for input(s): GLUCAP in the last 168 hours. ?Lipid Profile: ?No results for input(s): CHOL, HDL, LDLCALC, TRIG, CHOLHDL, LDLDIRECT in the last 72 hours. ?Thyroid Function Tests: ?No results for input(s): TSH, T4TOTAL, FREET4, T3FREE, THYROIDAB in the last 72 hours. ?Anemia Panel: ?No results for input(s): VITAMINB12, FOLATE, FERRITIN, TIBC, IRON, RETICCTPCT in the last 72 hours. ?Sepsis Labs: ?No results for input(s): PROCALCITON, LATICACIDVEN in the last 168 hours. ? ?Recent Results (from the past 240 hour(s))  ?Resp Panel by RT-PCR (Flu A&B, Covid) Nasopharyngeal Swab     Status: None  ? Collection Time: 01/26/22  2:00 PM  ? Specimen: Nasopharyngeal Swab; Nasopharyngeal(NP) swabs in vial transport medium  ?Result Value Ref Range Status  ? SARS Coronavirus 2 by RT PCR NEGATIVE NEGATIVE Final  ?  Comment: (NOTE) ?SARS-CoV-2 target nucleic acids are NOT DETECTED. ? ?The SARS-CoV-2 RNA is generally detectable in upper  respiratory ?specimens during the acute phase of infection. The lowest ?concentration of SARS-CoV-2 viral copies this assay can detect is ?138 copies/mL. A negative result does not preclude SARS-Cov-2 ?infection and should not be used as the sole basis for treatment or ?other patient management decisions. A negative result may occur with  ?improper specimen collection/handling, submission of specimen other ?than nasopharyngeal swab, presence of viral mutation(s) within the ?areas targeted by this assay, and inadequate number of viral ?copies(<138 copies/mL). A negative result must be combined with ?clinical observations, patient history, and epidemiological ?information. The expected result is Negative. ? ?Fact Sheet for Patients:  ?BloggerCourse.comhttps://www.fda.gov/media/152166/download ? ?Fact Sheet for Healthcare Providers:  ?SeriousBroker.ithttps://www.fda.gov/media/152162/download ? ?This test is no t yet approved or cleared by the [place] FDA and  ?has been authorized for detection and/or diagnosis of SARS-CoV-2 by ?FDA under an Emergency Use Authorization (EUA). This EUA will remain  ?in effect (meaning this test can be used) for the duration of the ?COVID-19 declaration under Section 564(b)(1) of the Act, 21 ?U.S.C.section 360bbb-3(b)(1), unless the authorization is terminated  ?or revoked sooner.  ? ? ?  ? Influenza A by PCR  NEGATIVE NEGATIVE Final  ? Influenza B by PCR NEGATIVE NEGATIVE Final  ?  Comment: (NOTE) ?The Xpert Xpress SARS-CoV-2/FLU/RSV plus assay is intended as an aid ?in the diagnosis of influenza from Nasopharyngeal swab specimens and ?should not be used as a sole basis for treatment. Nasal washings and ?aspirates are unacceptable for Xpert Xpress SARS-CoV-2/FLU/RSV ?testing. ? ?Fact Sheet for Patients: ?BloggerCourse.com ? ?Fact Sheet for Healthcare Providers: ?SeriousBroker.it ? ?This test is not yet approved or cleared by the [place] FDA and ?has been  authorized for detection and/or diagnosis of SARS-CoV-2 by ?FDA under an Emergency Use Authorization (EUA). This EUA will remain ?in effect (meaning this test can be used) for the duration of the ?COVID-19 declaration und

## 2022-01-28 DIAGNOSIS — J9621 Acute and chronic respiratory failure with hypoxia: Secondary | ICD-10-CM | POA: Diagnosis not present

## 2022-01-28 MED ORDER — AZITHROMYCIN 250 MG PO TABS
500.0000 mg | ORAL_TABLET | Freq: Every day | ORAL | Status: AC
  Administered 2022-01-28 – 2022-01-30 (×3): 500 mg via ORAL
  Filled 2022-01-28 (×3): qty 2

## 2022-01-28 MED ORDER — MENTHOL 3 MG MT LOZG
1.0000 | LOZENGE | OROMUCOSAL | Status: DC | PRN
  Administered 2022-01-28: 3 mg via ORAL
  Filled 2022-01-28 (×2): qty 9

## 2022-01-28 MED ORDER — ADULT MULTIVITAMIN W/MINERALS CH
1.0000 | ORAL_TABLET | Freq: Every day | ORAL | Status: DC
  Administered 2022-01-29 – 2022-02-08 (×11): 1 via ORAL
  Filled 2022-01-28 (×11): qty 1

## 2022-01-28 MED ORDER — IPRATROPIUM-ALBUTEROL 0.5-2.5 (3) MG/3ML IN SOLN
3.0000 mL | Freq: Four times a day (QID) | RESPIRATORY_TRACT | Status: DC
  Administered 2022-01-28 – 2022-01-29 (×6): 3 mL via RESPIRATORY_TRACT
  Filled 2022-01-28 (×6): qty 3

## 2022-01-28 MED ORDER — ENSURE MAX PROTEIN PO LIQD
11.0000 [oz_av] | Freq: Two times a day (BID) | ORAL | Status: DC
  Administered 2022-01-28 – 2022-02-08 (×22): 11 [oz_av] via ORAL
  Filled 2022-01-28: qty 330

## 2022-01-28 NOTE — Progress Notes (Signed)
Initial Nutrition Assessment ? ?DOCUMENTATION CODES:  ? ?Obesity unspecified ? ?INTERVENTION:  ? ?Ensure Max protein supplement BID, each supplement provides 150kcal and 30g of protein. ? ?MVI po daily  ? ?Liberalize diet  ? ?NUTRITION DIAGNOSIS:  ? ?Increased nutrient needs related to catabolic illness (COPD) as evidenced by estimated needs. ? ?GOAL:  ? ?Patient will meet greater than or equal to 90% of their needs ? ?MONITOR:  ? ?PO intake, Supplement acceptance, Labs, Weight trends, Skin, I & O's ? ?REASON FOR ASSESSMENT:  ? ?Consult ?Assessment of nutrition requirement/status ? ?ASSESSMENT:  ? ?56 y/o female with h/o COPD, MDD, anxiety, HTN and HLD who is admitted with CAP and COPD exacerbation. ? ?Met with pt in room today. Pt reports good appetite and oral intake at baseline but reports decreased oral intake for 3-4 days pta. Pt reports that her appetite and oral intake is improving in hospital but reports that she is not eating back to normal yet. Pt is documented to be eating 100% of her meals. RD discussed with pt the importance of adequate nutrition needed to preserve lean muscle. Pt is willing to drink chocolate Ensure Max in hospital. RD will add supplements and MVI to help pt meet her estimated needs. RD will also liberalize pt's diet. Per chart, pt appears weight stable at baseline.   ? ?Medications reviewed and include: azithromycin, lovenox, solu-medrol, nicotine, ceftriaxone  ? ?Labs reviewed:  ? ?NUTRITION - FOCUSED PHYSICAL EXAM: ? ?Flowsheet Row Most Recent Value  ?Orbital Region No depletion  ?Upper Arm Region No depletion  ?Thoracic and Lumbar Region No depletion  ?Buccal Region No depletion  ?Temple Region No depletion  ?Clavicle Bone Region No depletion  ?Clavicle and Acromion Bone Region No depletion  ?Scapular Bone Region No depletion  ?Dorsal Hand No depletion  ?Patellar Region No depletion  ?Anterior Thigh Region No depletion  ?Posterior Calf Region No depletion  ?Edema (RD Assessment)  None  ?Hair Reviewed  ?Eyes Reviewed  ?Mouth Reviewed  ?Skin Reviewed  ?Nails Reviewed  ? ?Diet Order:   ?Diet Order   ? ?       ?  Diet Heart Room service appropriate? Yes; Fluid consistency: Thin  Diet effective now       ?  ? ?  ?  ? ?  ? ?EDUCATION NEEDS:  ? ?Education needs have been addressed ? ?Skin:  Skin Assessment: Reviewed RN Assessment ? ?Last BM:  4/18- per RN documentation ? ?Height:  ? ?Ht Readings from Last 1 Encounters:  ?01/26/22 '5\' 7"'  (1.702 m)  ? ? ?Weight:  ? ?Wt Readings from Last 1 Encounters:  ?01/26/22 102.1 kg  ? ? ?Ideal Body Weight:  61.36 kg ? ?BMI:  Body mass index is 35.25 kg/m?. ? ?Estimated Nutritional Needs:  ? ?Kcal:  2000-2300kcal/day ? ?Protein:  100-115g/day ? ?Fluid:  1.9-2.2L/day ? ?Koleen Distance MS, RD, LDN ?Please refer to AMION for RD and/or RD on-call/weekend/after hours pager ? ?

## 2022-01-28 NOTE — Progress Notes (Signed)
PHARMACIST - PHYSICIAN COMMUNICATION ?DR:   Priscella Mann ?CONCERNING: Antibiotic IV to Oral Route Change Policy ? ?RECOMMENDATION: ?This patient is receiving Azithromycin by the intravenous route.  Based on criteria approved by the Pharmacy and Therapeutics Committee, the antibiotic(s) is/are being converted to the equivalent oral dose form(s). ? ? ?DESCRIPTION: ?These criteria include: ?Patient being treated for a respiratory tract infection, urinary tract infection, cellulitis or clostridium difficile associated diarrhea if on metronidazole ?The patient is not neutropenic and does not exhibit a GI malabsorption state ?The patient is eating (either orally or via tube) and/or has been taking other orally administered medications for a least 24 hours ?The patient is improving clinically and has a Tmax < 100.5 ? ?If you have questions about this conversion, please contact the Pharmacy Department  ?[]   [phone] )  [facility] ?[]   [phone] )  [facility]  ?[]   [phone] )  [facility] ?[]   [phone] )  [facility]  ? ?Luismanuel Corman Rodriguez-Guzman PharmD, BCPS ?01/28/2022 2:34 PM ? ?

## 2022-01-28 NOTE — Progress Notes (Signed)
SATURATION QUALIFICATIONS: (This note is used to comply with regulatory documentation for home oxygen) ? ?Patient Saturations on Room Air at Rest 80% ? ?Patient Saturations on 4 Liters while Ambulating = 86% ? ?Patient Saturations on 6 Liters of oxygen while Ambulating = 90% ? ?Please briefly explain why patient needs home oxygen: Patient desats to 80% on room air. She required 6 liters of oxygen to maintain sats at 90% when ambulating. She required 4 liters of oxygen to maintain sats of 93% when at rest ?

## 2022-01-28 NOTE — Progress Notes (Signed)
?PROGRESS NOTE ? ? ? ?Christy Mayer  OXB:[id number] DOB: 03/16/1966 DOA: 01/26/2022 ?PCP: Smitty Cords, DO  ? ? ?Brief Narrative:  ? 56 y.o. female with medical history significant of COPD, asthma, not on oxygen normally, hypertension, hyperlipidemia, depression with anxiety, mitral valve prolapse, tobacco abuse, migraine headaches, obesity, who presents with shortness breath. ?  ?Patient states that she has shortness of breath for more than 4 days, which has been progressively worsening.  Patient has dry cough, wheezing, no fever or chills.  Patient states that she does not have chest pain at rest, but coughing induces some sharp front chest pain.  No nausea, vomiting, diarrhea or abdominal pain.  No symptoms of UTI. ?  ?Patient was found to have oxygen desaturation to 88% on room air, which improved to 94% on 4 L oxygen.  Patient has severe dry cough, with acute respiratory distress ? ? ?Assessment & Plan: ?  ?Principal Problem: ?  Acute on chronic respiratory failure with hypoxia (HCC) ?Active Problems: ?  CAP (community acquired pneumonia) ?  COPD exacerbation (HCC) ?  Tobacco use disorder ?  Migraine headache ?  Obesity (BMI 30-39.9) ?  Depression with anxiety ?  HTN (hypertension) ? ?* Acute on chronic respiratory failure with hypoxia (HCC) ?Due to CAP and COPD exacerbation.  Patient does not meet criteria for sepsis. ?Remains on 4 L ?Plan: ?Solu-Medrol 60 mg IV twice daily ?DuoNebs every 4 hours ?Twice daily Brovana ?Twice daily Pulmicort ?IV Rocephin azithromycin ?Incentive spirometry ?Supplemental oxygen, wean as tolerated ?Follow infectious panel, no growth to date ?Monitor vitals and fever curve ?  ?  ?COPD exacerbation (HCC) ?See above ?  ?CAP (community acquired pneumonia) ?See above ?  ?HTN (hypertension) ?- IV hydralazine as needed ?-Continue home clonidine, lisinopril ?  ?Depression with anxiety ?- Continue home medications ?-As needed Xanax added ?  ?Obesity (BMI 30-39.9) ?BMI 35.25,  body weight 102kg. BMI >35 + 1 comorbidity of hypertension --> pt has morbid obesity.  ?-Diet and exercise.   ?-Encouraged to lose weight. ?-RD consultation ?  ?  ?  ?Migraine headache ?- As needed sumatriptan ?-Continue home Lamictal ?  ?Tobacco abuse: ?-Did counseling about the importance of quitting drinking ?-Smoking cessation advised ? ? ?DVT prophylaxis: SQ Lovenox ?Code Status: Full ?Family Communication: None today ?Disposition Plan: Status is: Inpatient ?Remains inpatient appropriate because: Acute hypoxic respiratory failure secondary to COPD/CAP. ? ? ?Level of care: Med-Surg ? ?Consultants:  ?None ? ?Procedures:  ?None ? ?Antimicrobials: ?Ceftriaxone ?Azithromycin ? ? ?Subjective: ?Patient seen and examined.  Work of breathing is improved from prior.  Still with significant cough and shortness of breath ? ?Objective: ?Vitals:  ? 01/28/22 1045 01/28/22 1047 01/28/22 1050 01/28/22 1055  ?BP:      ?Pulse:      ?Resp:      ?Temp:      ?TempSrc:      ?SpO2: 92% (!) 84% 90% 93%  ?Weight:      ?Height:      ? ? ?Intake/Output Summary (Last 24 hours) at 01/28/2022 1221 ?Last data filed at 01/28/2022 1054 ?Gross per 24 hour  ?Intake 1830.69 ml  ?Output 1500 ml  ?Net 330.69 ml  ? ?Filed Weights  ? 01/26/22 1322  ?Weight: 102.1 kg  ? ? ?Examination: ? ?General exam: NAD ?Respiratory system: Diffuse rhonchi bilaterally.  End expiratory wheeze.  Normal work of breathing.  4 L ?Cardiovascular system: S1-S2, RRR, no murmurs, no pedal edema ?Gastrointestinal system: Obese, soft, NT/ND,  normal bowel sounds ?Central nervous system: Alert and oriented. No focal neurological deficits. ?Extremities: Symmetric 5 x 5 power. ?Skin: No rashes, lesions or ulcers ?Psychiatry: Judgement and insight appear normal. Mood & affect appropriate.  ? ? ? ?Data Reviewed: I have personally reviewed following labs and imaging studies ? ?CBC: ?Recent Labs  ?Lab 01/26/22 ?1324 01/27/22 ?0342  ?WBC 7.7 8.9  ?HGB 15.5* 14.4  ?HCT 45.2 42.5  ?MCV  92.8 94.4  ?PLT 262 236  ? ?Basic Metabolic Panel: ?Recent Labs  ?Lab 01/26/22 ?1324  ?NA 137  ?K 3.7  ?CL 102  ?CO2 23  ?GLUCOSE 181*  ?BUN 10  ?CREATININE 0.67  ?CALCIUM 9.3  ? ?GFR: ?Estimated Creatinine Clearance: 96.4 mL/min (by C-G formula based on SCr of 0.67 mg/dL). ?Liver Function Tests: ?No results for input(s): AST, ALT, ALKPHOS, BILITOT, PROT, ALBUMIN in the last 168 hours. ?No results for input(s): LIPASE, AMYLASE in the last 168 hours. ?No results for input(s): AMMONIA in the last 168 hours. ?Coagulation Profile: ?No results for input(s): INR, PROTIME in the last 168 hours. ?Cardiac Enzymes: ?No results for input(s): CKTOTAL, CKMB, CKMBINDEX, TROPONINI in the last 168 hours. ?BNP (last 3 results) ?No results for input(s): PROBNP in the last 8760 hours. ?HbA1C: ?No results for input(s): HGBA1C in the last 72 hours. ?CBG: ?No results for input(s): GLUCAP in the last 168 hours. ?Lipid Profile: ?No results for input(s): CHOL, HDL, LDLCALC, TRIG, CHOLHDL, LDLDIRECT in the last 72 hours. ?Thyroid Function Tests: ?No results for input(s): TSH, T4TOTAL, FREET4, T3FREE, THYROIDAB in the last 72 hours. ?Anemia Panel: ?No results for input(s): VITAMINB12, FOLATE, FERRITIN, TIBC, IRON, RETICCTPCT in the last 72 hours. ?Sepsis Labs: ?No results for input(s): PROCALCITON, LATICACIDVEN in the last 168 hours. ? ?Recent Results (from the past 240 hour(s))  ?Resp Panel by RT-PCR (Flu A&B, Covid) Nasopharyngeal Swab     Status: None  ? Collection Time: 01/26/22  2:00 PM  ? Specimen: Nasopharyngeal Swab; Nasopharyngeal(NP) swabs in vial transport medium  ?Result Value Ref Range Status  ? SARS Coronavirus 2 by RT PCR NEGATIVE NEGATIVE Final  ?  Comment: (NOTE) ?SARS-CoV-2 target nucleic acids are NOT DETECTED. ? ?The SARS-CoV-2 RNA is generally detectable in upper respiratory ?specimens during the acute phase of infection. The lowest ?concentration of SARS-CoV-2 viral copies this assay can detect is ?138 copies/mL. A  negative result does not preclude SARS-Cov-2 ?infection and should not be used as the sole basis for treatment or ?other patient management decisions. A negative result may occur with  ?improper specimen collection/handling, submission of specimen other ?than nasopharyngeal swab, presence of viral mutation(s) within the ?areas targeted by this assay, and inadequate number of viral ?copies(<138 copies/mL). A negative result must be combined with ?clinical observations, patient history, and epidemiological ?information. The expected result is Negative. ? ?Fact Sheet for Patients:  ?BloggerCourse.com ? ?Fact Sheet for Healthcare Providers:  ?SeriousBroker.it ? ?This test is no t yet approved or cleared by the [place] FDA and  ?has been authorized for detection and/or diagnosis of SARS-CoV-2 by ?FDA under an Emergency Use Authorization (EUA). This EUA will remain  ?in effect (meaning this test can be used) for the duration of the ?COVID-19 declaration under Section 564(b)(1) of the Act, 21 ?U.S.C.section 360bbb-3(b)(1), unless the authorization is terminated  ?or revoked sooner.  ? ? ?  ? Influenza A by PCR NEGATIVE NEGATIVE Final  ? Influenza B by PCR NEGATIVE NEGATIVE Final  ?  Comment: (NOTE) ?The Xpert Xpress SARS-CoV-2/FLU/RSV  plus assay is intended as an aid ?in the diagnosis of influenza from Nasopharyngeal swab specimens and ?should not be used as a sole basis for treatment. Nasal washings and ?aspirates are unacceptable for Xpert Xpress SARS-CoV-2/FLU/RSV ?testing. ? ?Fact Sheet for Patients: ?BloggerCourse.comhttps://www.fda.gov/media/152166/download ? ?Fact Sheet for Healthcare Providers: ?SeriousBroker.ithttps://www.fda.gov/media/152162/download ? ?This test is not yet approved or cleared by the [place] FDA and ?has been authorized for detection and/or diagnosis of SARS-CoV-2 by ?FDA under an Emergency Use Authorization (EUA). This EUA will remain ?in effect (meaning this test can  be used) for the duration of the ?COVID-19 declaration under Section 564(b)(1) of the Act, 21 U.S.C. ?section 360bbb-3(b)(1), unless the authorization is terminated or ?revoked. ? ?[facility] at [facility]

## 2022-01-29 ENCOUNTER — Inpatient Hospital Stay: Payer: Medicaid Other

## 2022-01-29 DIAGNOSIS — J9621 Acute and chronic respiratory failure with hypoxia: Secondary | ICD-10-CM | POA: Diagnosis not present

## 2022-01-29 LAB — BASIC METABOLIC PANEL
Anion gap: 12 (ref 5–15)
BUN: 21 mg/dL — ABNORMAL HIGH (ref 6–20)
CO2: 21 mmol/L — ABNORMAL LOW (ref 22–32)
Calcium: 9 mg/dL (ref 8.9–10.3)
Chloride: 97 mmol/L — ABNORMAL LOW (ref 98–111)
Creatinine, Ser: 0.97 mg/dL (ref 0.44–1.00)
GFR, Estimated: >60 mL/min (ref >60)
Glucose, Bld: 471 mg/dL — ABNORMAL HIGH (ref 70–99)
Potassium: 5.1 mmol/L (ref 3.5–5.1)
Sodium: 130 mmol/L — ABNORMAL LOW (ref 135–145)

## 2022-01-29 LAB — CBC
HCT: 43.8 % (ref 36.0–46.0)
Hemoglobin: 14.4 g/dL (ref 12.0–15.0)
MCH: 31.9 pg (ref 26.0–34.0)
MCHC: 32.9 g/dL (ref 30.0–36.0)
MCV: 97.1 fL (ref 80.0–100.0)
Platelets: 156 10*3/uL (ref 150–400)
RBC: 4.51 MIL/uL (ref 3.87–5.11)
RDW: 11.8 % (ref 11.5–15.5)
WBC: 11.6 10*3/uL — ABNORMAL HIGH (ref 4.0–10.5)
nRBC: 0 % (ref 0.0–0.2)

## 2022-01-29 MED ORDER — GUAIFENESIN ER 600 MG PO TB12
600.0000 mg | ORAL_TABLET | Freq: Two times a day (BID) | ORAL | Status: DC
  Administered 2022-01-29 – 2022-02-08 (×21): 600 mg via ORAL
  Filled 2022-01-29 (×21): qty 1

## 2022-01-29 MED ORDER — IOHEXOL 350 MG/ML SOLN
75.0000 mL | Freq: Once | INTRAVENOUS | Status: AC | PRN
  Administered 2022-01-29: 75 mL via INTRAVENOUS

## 2022-01-29 MED ORDER — METHYLPREDNISOLONE SODIUM SUCC 40 MG IJ SOLR
40.0000 mg | Freq: Two times a day (BID) | INTRAMUSCULAR | Status: DC
  Administered 2022-01-29 – 2022-01-31 (×4): 40 mg via INTRAVENOUS
  Filled 2022-01-29 (×4): qty 1

## 2022-01-29 NOTE — Progress Notes (Signed)
?PROGRESS NOTE ? ? ? ?Christy Mayer  [id number] DOB: 1965-11-05 DOA: 01/26/2022 ?PCP: Olin Hauser, DO  ? ? ?Brief Narrative:  ? 56 y.o. female with medical history significant of COPD, asthma, not on oxygen normally, hypertension, hyperlipidemia, depression with anxiety, mitral valve prolapse, tobacco abuse, migraine headaches, obesity, who presents with shortness breath. ?  ?Patient states that she has shortness of breath for more than 4 days, which has been progressively worsening.  Patient has dry cough, wheezing, no fever or chills.  Patient states that she does not have chest pain at rest, but coughing induces some sharp front chest pain.  No nausea, vomiting, diarrhea or abdominal pain.  No symptoms of UTI. ?  ?Patient was found to have oxygen desaturation to 88% on room air, which improved to 94% on 4 L oxygen.  Patient has severe dry cough, with acute respiratory distress ? ? ?Assessment & Plan: ?  ?Principal Problem: ?  Acute on chronic respiratory failure with hypoxia ([place]) ?Active Problems: ?  CAP (community acquired pneumonia) ?  COPD exacerbation ([place]) ?  Tobacco use disorder ?  Migraine headache ?  Obesity (BMI 30-39.9) ?  Depression with anxiety ?  HTN (hypertension) ? ?Acute on chronic respiratory failure with hypoxia (HCC) ?Due to CAP and COPD exacerbation.  Patient does not meet criteria for sepsis. ?Remains on 4 L ?Plan: ?Decrease steroids Solu-Medrol 40 mg IV twice daily ?Continue to quickly taper steroids ?DuoNebs every 4 hours ?Twice daily Brovana ?Twice daily Pulmicort ?IV Rocephin azithromycin ?Incentive spirometry ?Supplemental oxygen, wean as tolerated ?Follow infectious panel, no growth to date ?Monitor vitals and fever curve ?  ?  ?COPD exacerbation ([place]) ?See above ?  ?CAP (community acquired pneumonia) ?See above ?  ?HTN (hypertension) ?Adequate control over interval ?- IV hydralazine as needed ?-Continue home clonidine, lisinopril ?  ?Depression with anxiety ?-  Continue home medications ?-As needed Xanax added ?  ?Obesity (BMI 30-39.9) ?BMI 35.25, body weight 102kg. BMI >35 + 1 comorbidity of hypertension --> pt has morbid obesity.  ?-Diet and exercise.   ?-Encouraged to lose weight. ?-RD consultation ?  ?  ?  ?Migraine headache ?- As needed sumatriptan ?-Continue home Lamictal ?  ?Tobacco abuse: ?-Did counseling about the importance of quitting drinking ?-Smoking cessation advised ? ? ?DVT prophylaxis: SQ Lovenox ?Code Status: Full ?Family Communication: None today.  Offered to call but patient declined ?Disposition Plan: Status is: Inpatient ?Remains inpatient appropriate because: Acute hypoxic respiratory failure secondary to COPD/CAP. ? ? ?Level of care: Med-Surg ? ?Consultants:  ?None ? ?Procedures:  ?None ? ?Antimicrobials: ?Ceftriaxone ?Azithromycin ? ? ?Subjective: ?Patient seen and examined.  Work of breathing is improved from prior.  Still with significant cough and shortness of breath.  Significant desaturation with minimal exertion ? ?Objective: ?Vitals:  ? 01/28/22 2026 01/29/22 0414 01/29/22 0723 01/29/22 0800  ?BP: 136/69 128/66  (!) 119/51  ?Pulse: 86 69 75 79  ?Resp: 20  18 18   ?Temp: 98.8 ?F (37.1 ?C) 97.9 ?F (36.6 ?C)  98.3 ?F (36.8 ?C)  ?TempSrc: Oral   Oral  ?SpO2: 92% 93% (!) 89% 91%  ?Weight:      ?Height:      ? ? ?Intake/Output Summary (Last 24 hours) at 01/29/2022 1123 ?Last data filed at 01/29/2022 1021 ?Gross per 24 hour  ?Intake 720 ml  ?Output --  ?Net 720 ml  ? ?Filed Weights  ? 01/26/22 1322  ?Weight: 102.1 kg  ? ? ?Examination: ? ?General exam:  No acute distress ?Respiratory system: Air entry improved.  Bilateral crackles.  Normal work of breathing.  3.5 L ?Cardiovascular system: S1-S2, RRR, no murmurs, no pedal edema ?Gastrointestinal system: Obese, soft, NT/ND, normal bowel sounds ?Central nervous system: Alert and oriented. No focal neurological deficits. ?Extremities: Symmetric 5 x 5 power. ?Skin: No rashes, lesions or ulcers ?Psychiatry:  Judgement and insight appear normal. Mood & affect appropriate.  ? ? ? ?Data Reviewed: I have personally reviewed following labs and imaging studies ? ?CBC: ?Recent Labs  ?Lab 01/26/22 ?1324 01/27/22 ?0342 01/29/22 ?0932  ?WBC 7.7 8.9 11.6*  ?HGB 15.5* 14.4 14.4  ?HCT 45.2 42.5 43.8  ?MCV 92.8 94.4 97.1  ?PLT 262 236 156  ? ?Basic Metabolic Panel: ?Recent Labs  ?Lab 01/26/22 ?1324 01/29/22 ?0932  ?NA 137 130*  ?K 3.7 5.1  ?CL 102 97*  ?CO2 23 21*  ?GLUCOSE 181* 471*  ?BUN 10 21*  ?CREATININE 0.67 0.97  ?CALCIUM 9.3 9.0  ? ?GFR: ?Estimated Creatinine Clearance: 79.5 mL/min (by C-G formula based on SCr of 0.97 mg/dL). ?Liver Function Tests: ?No results for input(s): AST, ALT, ALKPHOS, BILITOT, PROT, ALBUMIN in the last 168 hours. ?No results for input(s): LIPASE, AMYLASE in the last 168 hours. ?No results for input(s): AMMONIA in the last 168 hours. ?Coagulation Profile: ?No results for input(s): INR, PROTIME in the last 168 hours. ?Cardiac Enzymes: ?No results for input(s): CKTOTAL, CKMB, CKMBINDEX, TROPONINI in the last 168 hours. ?BNP (last 3 results) ?No results for input(s): PROBNP in the last 8760 hours. ?HbA1C: ?No results for input(s): HGBA1C in the last 72 hours. ?CBG: ?No results for input(s): GLUCAP in the last 168 hours. ?Lipid Profile: ?No results for input(s): CHOL, HDL, LDLCALC, TRIG, CHOLHDL, LDLDIRECT in the last 72 hours. ?Thyroid Function Tests: ?No results for input(s): TSH, T4TOTAL, FREET4, T3FREE, THYROIDAB in the last 72 hours. ?Anemia Panel: ?No results for input(s): VITAMINB12, FOLATE, FERRITIN, TIBC, IRON, RETICCTPCT in the last 72 hours. ?Sepsis Labs: ?No results for input(s): PROCALCITON, LATICACIDVEN in the last 168 hours. ? ?Recent Results (from the past 240 hour(s))  ?Resp Panel by RT-PCR (Flu A&B, Covid) Nasopharyngeal Swab     Status: None  ? Collection Time: 01/26/22  2:00 PM  ? Specimen: Nasopharyngeal Swab; Nasopharyngeal(NP) swabs in vial transport medium  ?Result Value Ref Range  Status  ? SARS Coronavirus 2 by RT PCR NEGATIVE NEGATIVE Final  ?  Comment: (NOTE) ?SARS-CoV-2 target nucleic acids are NOT DETECTED. ? ?The SARS-CoV-2 RNA is generally detectable in upper respiratory ?specimens during the acute phase of infection. The lowest ?concentration of SARS-CoV-2 viral copies this assay can detect is ?138 copies/mL. A negative result does not preclude SARS-Cov-2 ?infection and should not be used as the sole basis for treatment or ?other patient management decisions. A negative result may occur with  ?improper specimen collection/handling, submission of specimen other ?than nasopharyngeal swab, presence of viral mutation(s) within the ?areas targeted by this assay, and inadequate number of viral ?copies(<138 copies/mL). A negative result must be combined with ?clinical observations, patient history, and epidemiological ?information. The expected result is Negative. ? ?Fact Sheet for Patients:  ?EntrepreneurPulse.com.au ? ?Fact Sheet for Healthcare Providers:  ?IncredibleEmployment.be ? ?This test is no t yet approved or cleared by the [place] FDA and  ?has been authorized for detection and/or diagnosis of SARS-CoV-2 by ?FDA under an Emergency Use Authorization (EUA). This EUA will remain  ?in effect (meaning this test can be used) for the duration of the ?COVID-19  declaration under Section 564(b)(1) of the Act, 21 ?U.S.C.section 360bbb-3(b)(1), unless the authorization is terminated  ?or revoked sooner.  ? ? ?  ? Influenza A by PCR NEGATIVE NEGATIVE Final  ? Influenza B by PCR NEGATIVE NEGATIVE Final  ?  Comment: (NOTE) ?The Xpert Xpress SARS-CoV-2/FLU/RSV plus assay is intended as an aid ?in the diagnosis of influenza from Nasopharyngeal swab specimens and ?should not be used as a sole basis for treatment. Nasal washings and ?aspirates are unacceptable for Xpert Xpress SARS-CoV-2/FLU/RSV ?testing. ? ?Fact Sheet for  Patients: ?EntrepreneurPulse.com.au ? ?Fact Sheet for Healthcare Providers: ?IncredibleEmployment.be ? ?This test is not yet approved or cleared by the [place] and ?has been authorized for [employer]

## 2022-01-30 ENCOUNTER — Inpatient Hospital Stay: Payer: Medicaid Other

## 2022-01-30 DIAGNOSIS — J9621 Acute and chronic respiratory failure with hypoxia: Secondary | ICD-10-CM | POA: Diagnosis not present

## 2022-01-30 MED ORDER — IPRATROPIUM-ALBUTEROL 0.5-2.5 (3) MG/3ML IN SOLN
3.0000 mL | Freq: Four times a day (QID) | RESPIRATORY_TRACT | Status: DC
  Administered 2022-01-30 – 2022-02-08 (×37): 3 mL via RESPIRATORY_TRACT
  Filled 2022-01-30 (×35): qty 3

## 2022-01-30 MED ORDER — IPRATROPIUM-ALBUTEROL 0.5-2.5 (3) MG/3ML IN SOLN: 3.0000 mL | Freq: Three times a day (TID) | RESPIRATORY_TRACT | Status: DC

## 2022-01-30 MED ORDER — BISACODYL 10 MG RE SUPP: 10.0000 mg | Freq: Every day | RECTAL | Status: DC | PRN

## 2022-01-30 MED ORDER — POLYETHYLENE GLYCOL 3350 17 G PO PACK
17.0000 g | PACK | Freq: Every day | ORAL | Status: DC
  Administered 2022-01-30 – 2022-02-05 (×6): 17 g via ORAL
  Filled 2022-01-30 (×8): qty 1

## 2022-01-30 MED ORDER — SENNOSIDES-DOCUSATE SODIUM 8.6-50 MG PO TABS
1.0000 | ORAL_TABLET | Freq: Two times a day (BID) | ORAL | Status: DC
  Administered 2022-01-30 – 2022-02-05 (×11): 1 via ORAL
  Filled 2022-01-30 (×16): qty 1

## 2022-01-30 NOTE — Progress Notes (Signed)
?PROGRESS NOTE ? ? ? ?Christy Mayer  ZOX:[id number] DOB: [birth year] DOA: 01/26/2022 ?PCP: Smitty Cords, DO  ? ? ?Brief Narrative:  ? 56 y.o. female with medical history significant of COPD, asthma, not on oxygen normally, hypertension, hyperlipidemia, depression with anxiety, mitral valve prolapse, tobacco abuse, migraine headaches, obesity, who presents with shortness breath. ?  ?Patient states that she has shortness of breath for more than 4 days, which has been progressively worsening.  Patient has dry cough, wheezing, no fever or chills.  Patient states that she does not have chest pain at rest, but coughing induces some sharp front chest pain.  No nausea, vomiting, diarrhea or abdominal pain.  No symptoms of UTI. ?  ?Patient was found to have oxygen desaturation to 88% on room air, which improved to 94% on 4 L oxygen.  Patient has severe dry cough, with acute respiratory distress ? ? ?Assessment & Plan: ?  ?Principal Problem: ?  Acute on chronic respiratory failure with hypoxia (HCC) ?Active Problems: ?  CAP (community acquired pneumonia) ?  COPD exacerbation (HCC) ?  Tobacco use disorder ?  Migraine headache ?  Obesity (BMI 30-39.9) ?  Depression with anxiety ?  HTN (hypertension) ? ?Acute on chronic respiratory failure with hypoxia (HCC) ?Due to CAP and COPD exacerbation.  Patient does not meet criteria for sepsis. ?Now on 5 L as of 4/23 ?CT angio negative for PE ?Plan: ?\Continue Solu-Medrol 40 mg IV twice daily for today ?Added 3 times daily MetaNeb ?Appreciate RT assistance ?DuoNebs every 4 hours ?Twice daily Brovana ?Twice daily Pulmicort ?IV Rocephin azithromycin ?Incentive spirometry ?Supplemental oxygen, wean as tolerated ?Follow infectious panel, no growth to date ?Monitor vitals and fever curve ?  ?  ?COPD exacerbation (HCC) ?See above ?  ?CAP (community acquired pneumonia) ?See above ?  ?HTN (hypertension) ?Adequate control over interval ?- IV hydralazine as needed ?-Continue home  clonidine, lisinopril ?-Ensure pain and anxiety control ?  ?Depression with anxiety ?- Continue home medications ?-As needed Xanax added ?  ?Obesity (BMI 30-39.9) ?BMI 35.25, body weight 102kg. BMI >35 + 1 comorbidity of hypertension --> pt has morbid obesity.  ?-Diet and exercise.   ?-Encouraged to lose weight. ?-RD consultation ?  ?  ?  ?Migraine headache ?- As needed sumatriptan ?-Continue home Lamictal ?  ?Tobacco abuse: ?-Did counseling about the importance of quitting drinking ?-Smoking cessation advised ? ? ?DVT prophylaxis: SQ Lovenox ?Code Status: Full ?Family Communication: None today.  Offered to call but patient declined ?Disposition Plan: Status is: Inpatient ?Remains inpatient appropriate because: Acute hypoxic respiratory failure secondary to COPD/CAP. ? ? ?Level of care: Med-Surg ? ?Consultants:  ?None ? ?Procedures:  ?None ? ?Antimicrobials: ?Ceftriaxone ?Azithromycin ? ? ?Subjective: ?Patient seen and examined.  Clinically appears improved.  Still on 5 L.  Unable to wean ? ?Objective: ?Vitals:  ? 01/29/22 2110 01/30/22 0519 01/30/22 0739 01/30/22 0800  ?BP: 139/82 (!) 143/85  121/80  ?Pulse: 78 71 74 78  ?Resp: 16 20 18 20   ?Temp: 98.5 ?F (36.9 ?C) 98.6 ?F (37 ?C)  97.7 ?F (36.5 ?C)  ?TempSrc: Oral Oral    ?SpO2: 93% 92% 90% 90%  ?Weight:      ?Height:      ? ? ?Intake/Output Summary (Last 24 hours) at 01/30/2022 1048 ?Last data filed at 01/30/2022 1001 ?Gross per 24 hour  ?Intake 840 ml  ?Output --  ?Net 840 ml  ? ?Filed Weights  ? 01/26/22 1322  ?Weight: 102.1 kg  ? ? ?  Examination: ? ?General exam: NAD ?Respiratory system: Bibasilar crackles.  Normal work of breathing.  5 L ?Cardiovascular system: S1-S2, RRR, no murmurs, no pedal edema ?Gastrointestinal system: Obese, soft, NT/ND, normal bowel sounds ?Central nervous system: Alert and oriented. No focal neurological deficits. ?Extremities: Symmetric 5 x 5 power. ?Skin: No rashes, lesions or ulcers ?Psychiatry: Judgement and insight appear normal.  Mood & affect appropriate.  ? ? ? ?Data Reviewed: I have personally reviewed following labs and imaging studies ? ?CBC: ?Recent Labs  ?Lab 01/26/22 ?1324 01/27/22 ?0342 01/29/22 ?0932  ?WBC 7.7 8.9 11.6*  ?HGB 15.5* 14.4 14.4  ?HCT 45.2 42.5 43.8  ?MCV 92.8 94.4 97.1  ?PLT 262 236 156  ? ?Basic Metabolic Panel: ?Recent Labs  ?Lab 01/26/22 ?1324 01/29/22 ?0932  ?NA 137 130*  ?K 3.7 5.1  ?CL 102 97*  ?CO2 23 21*  ?GLUCOSE 181* 471*  ?BUN 10 21*  ?CREATININE 0.67 0.97  ?CALCIUM 9.3 9.0  ? ?GFR: ?Estimated Creatinine Clearance: 79.5 mL/min (by C-G formula based on SCr of 0.97 mg/dL). ?Liver Function Tests: ?No results for input(s): AST, ALT, ALKPHOS, BILITOT, PROT, ALBUMIN in the last 168 hours. ?No results for input(s): LIPASE, AMYLASE in the last 168 hours. ?No results for input(s): AMMONIA in the last 168 hours. ?Coagulation Profile: ?No results for input(s): INR, PROTIME in the last 168 hours. ?Cardiac Enzymes: ?No results for input(s): CKTOTAL, CKMB, CKMBINDEX, TROPONINI in the last 168 hours. ?BNP (last 3 results) ?No results for input(s): PROBNP in the last 8760 hours. ?HbA1C: ?No results for input(s): HGBA1C in the last 72 hours. ?CBG: ?No results for input(s): GLUCAP in the last 168 hours. ?Lipid Profile: ?No results for input(s): CHOL, HDL, LDLCALC, TRIG, CHOLHDL, LDLDIRECT in the last 72 hours. ?Thyroid Function Tests: ?No results for input(s): TSH, T4TOTAL, FREET4, T3FREE, THYROIDAB in the last 72 hours. ?Anemia Panel: ?No results for input(s): VITAMINB12, FOLATE, FERRITIN, TIBC, IRON, RETICCTPCT in the last 72 hours. ?Sepsis Labs: ?No results for input(s): PROCALCITON, LATICACIDVEN in the last 168 hours. ? ?Recent Results (from the past 240 hour(s))  ?Resp Panel by RT-PCR (Flu A&B, Covid) Nasopharyngeal Swab     Status: None  ? Collection Time: 01/26/22  2:00 PM  ? Specimen: Nasopharyngeal Swab; Nasopharyngeal(NP) swabs in vial transport medium  ?Result Value Ref Range Status  ? SARS Coronavirus 2 by RT PCR  NEGATIVE NEGATIVE Final  ?  Comment: (NOTE) ?SARS-CoV-2 target nucleic acids are NOT DETECTED. ? ?The SARS-CoV-2 RNA is generally detectable in upper respiratory ?specimens during the acute phase of infection. The lowest ?concentration of SARS-CoV-2 viral copies this assay can detect is ?138 copies/mL. A negative result does not preclude SARS-Cov-2 ?infection and should not be used as the sole basis for treatment or ?other patient management decisions. A negative result may occur with  ?improper specimen collection/handling, submission of specimen other ?than nasopharyngeal swab, presence of viral mutation(s) within the ?areas targeted by this assay, and inadequate number of viral ?copies(<138 copies/mL). A negative result must be combined with ?clinical observations, patient history, and epidemiological ?information. The expected result is Negative. ? ?Fact Sheet for Patients:  ?BloggerCourse.com ? ?Fact Sheet for Healthcare Providers:  ?SeriousBroker.it ? ?This test is no t yet approved or cleared by the [place] FDA and  ?has been authorized for detection and/or diagnosis of SARS-CoV-2 by ?FDA under an Emergency Use Authorization (EUA). This EUA will remain  ?in effect (meaning this test can be used) for the duration of the ?COVID-19 declaration under  Section 564(b)(1) of the Act, 21 ?U.S.C.section 360bbb-3(b)(1), unless the authorization is terminated  ?or revoked sooner.  ? ? ?  ? Influenza A by PCR NEGATIVE NEGATIVE Final  ? Influenza B by PCR NEGATIVE NEGATIVE Final  ?  Comment: (NOTE) ?The Xpert Xpress SARS-CoV-2/FLU/RSV plus assay is intended as an aid ?in the diagnosis of influenza from Nasopharyngeal swab specimens and ?should not be used as a sole basis for treatment. Nasal washings and ?aspirates are unacceptable for Xpert Xpress SARS-CoV-2/FLU/RSV ?testing. ? ?Fact Sheet for Patients: ?BloggerCourse.comhttps://www.fda.gov/media/152166/download ? ?Fact Sheet for  Healthcare Providers: ?SeriousBroker.ithttps://www.fda.gov/media/152162/download ? ?This test is not yet approved or cleared by the [place] FDA and ?has been authorized for detection and/or diagnosis of SARS-CoV-2 by ?F

## 2022-01-30 NOTE — TOC Progression Note (Signed)
Transition of Care (TOC) - Progression Note  ? ? ?Patient Details  ?Name: Christy Mayer ?MRN: [id number] ?Date of Birth: [birth year] ? ?Transition of Care (TOC) CM/SW Contact  ?Annie Roseboom, LCSW ?Phone Number: [phone] ?01/30/2022, 10:29 AM ? ?Clinical Narrative:    ? ?Attending update, patient remains on 5L O2, desaturation with minimal exertion.  Anticipated patient will need O2 for home when ready for discharge, not sure about liters yet. TOC will continue to follow. ? ? ?  ?  ? ?Expected Discharge Plan and Services ?  ?  ?  ?  ?  ?                ?  ?  ?  ?  ?  ?  ?  ?  ?  ?  ? ? ?Social Determinants of Health (SDOH) Interventions ?  ? ?Readmission Risk Interventions ?   ? View : No data to display.  ?  ?  ?  ? ? ?

## 2022-01-31 DIAGNOSIS — J9621 Acute and chronic respiratory failure with hypoxia: Secondary | ICD-10-CM | POA: Diagnosis not present

## 2022-01-31 LAB — CULTURE, BLOOD (ROUTINE X 2)
Culture: NO GROWTH
Culture: NO GROWTH
Special Requests: ADEQUATE
Special Requests: ADEQUATE

## 2022-01-31 MED ORDER — PREDNISONE 20 MG PO TABS
40.0000 mg | ORAL_TABLET | Freq: Every day | ORAL | Status: DC
  Administered 2022-01-31: 40 mg via ORAL
  Filled 2022-01-31 (×2): qty 2

## 2022-01-31 MED ORDER — ORAL CARE MOUTH RINSE
15.0000 mL | Freq: Two times a day (BID) | OROMUCOSAL | Status: DC
  Administered 2022-02-01 – 2022-02-07 (×11): 15 mL via OROMUCOSAL

## 2022-01-31 NOTE — Progress Notes (Signed)
?PROGRESS NOTE ? ? ? ?Christy KiefBrenda M Mayer  WUJ:[id number] DOB: 08/17/1966 DOA: 01/26/2022 ?PCP: Smitty CordsKaramalegos, Alexander J, DO  ? ? ?Brief Narrative:  ? 56 y.o. female with medical history significant of COPD, asthma, not on oxygen normally, hypertension, hyperlipidemia, depression with anxiety, mitral valve prolapse, tobacco abuse, migraine headaches, obesity, who presents with shortness breath. ?  ?Patient states that she has shortness of breath for more than 4 days, which has been progressively worsening.  Patient has dry cough, wheezing, no fever or chills.  Patient states that she does not have chest pain at rest, but coughing induces some sharp front chest pain.  No nausea, vomiting, diarrhea or abdominal pain.  No symptoms of UTI. ?  ?Patient was found to have oxygen desaturation to 88% on room air, which improved to 94% on 4 L oxygen.  Patient has severe dry cough, with acute respiratory distress ? ? ?Assessment & Plan: ?  ?Principal Problem: ?  Acute on chronic respiratory failure with hypoxia (HCC) ?Active Problems: ?  CAP (community acquired pneumonia) ?  COPD exacerbation (HCC) ?  Tobacco use disorder ?  Migraine headache ?  Obesity (BMI 30-39.9) ?  Depression with anxiety ?  HTN (hypertension) ? ?Acute on chronic respiratory failure with hypoxia (HCC) ?Due to CAP and COPD exacerbation.  Patient does not meet criteria for sepsis. ?Now on 5 L as of 4/23 ?CT angio negative for PE ?Clinically appears improved ?Plan: ?DC IV steroids ?Start p.o. prednisone 40 mg a day x5 days ?Added 3 times daily MetaNeb ?Appreciate RT assistance ?DuoNebs every 4 hours ?Twice daily Brovana ?Twice daily Pulmicort ?IV Rocephin azithromycin ?Incentive spirometry ?Supplemental oxygen, wean as tolerated ?Follow infectious panel, no growth to date ?Monitor vitals and fever curve ?Patient will need to be on 2 to 3 L prior to discharge ?  ?  ?COPD exacerbation (HCC) ?See above ?  ?CAP (community acquired pneumonia) ?See above ?  ?HTN  (hypertension) ?Adequate control over interval ?- IV hydralazine as needed ?-Continue home clonidine, lisinopril ?-Ensure pain and anxiety control ?  ?Depression with anxiety ?- Continue home medications ?-As needed Xanax added ?  ?Obesity (BMI 30-39.9) ?BMI 35.25, body weight 102kg. BMI >35 + 1 comorbidity of hypertension --> pt has morbid obesity.  ?-Diet and exercise.   ?-Encouraged to lose weight. ?-RD consultation ?  ?Migraine headache ?- As needed sumatriptan ?-Continue home Lamictal ?  ?Tobacco abuse: ?-Did counseling about the importance of quitting drinking ?-Smoking cessation advised ?-NicoDerm patch offered ? ? ?DVT prophylaxis: SQ Lovenox ?Code Status: Full ?Family Communication: None today.  Offered to call but patient declined ?Disposition Plan: Status is: Inpatient ?Remains inpatient appropriate because: Acute hypoxic respiratory failure secondary to COPD/CAP.  Tentative plan discharge 4/25 if able to wean off 5 L ? ? ?Level of care: Med-Surg ? ?Consultants:  ?None ? ?Procedures:  ?None ? ?Antimicrobials: ?Ceftriaxone ?Azithromycin ? ? ?Subjective: ?Patient seen and examined.  Clinically appears improved.  Still on 5 L.  Unable to wean ? ?Objective: ?Vitals:  ? 01/31/22 0419 01/31/22 0734 01/31/22 0801 01/31/22 0813  ?BP: 128/75 (!) 159/96    ?Pulse: 65 69    ?Resp: 18     ?Temp: 97.8 ?F (36.6 ?C) 98.2 ?F (36.8 ?C)    ?TempSrc: Oral     ?SpO2: 93% 93% 93% 93%  ?Weight:      ?Height:      ? ? ?Intake/Output Summary (Last 24 hours) at 01/31/2022 1021 ?Last data filed at 01/31/2022 1018 ?Michaell CowingGross  per 24 hour  ?Intake 840 ml  ?Output --  ?Net 840 ml  ? ?Filed Weights  ? 01/26/22 1322  ?Weight: 102.1 kg  ? ? ?Examination: ? ?General exam: No acute distress ?Respiratory system: Mild bibasilar crackles.  Improved air entry.  Normal work of breathing.  5 L ?Cardiovascular system: S1-S2, RRR, no murmurs, no pedal edema ?Gastrointestinal system: Obese, soft, NT/ND, normal bowel sounds ?Central nervous system: Alert  and oriented. No focal neurological deficits. ?Extremities: Symmetric 5 x 5 power. ?Skin: No rashes, lesions or ulcers ?Psychiatry: Judgement and insight appear normal. Mood & affect appropriate.  ? ? ? ?Data Reviewed: I have personally reviewed following labs and imaging studies ? ?CBC: ?Recent Labs  ?Lab 01/26/22 ?1324 01/27/22 ?0342 01/29/22 ?0932  ?WBC 7.7 8.9 11.6*  ?HGB 15.5* 14.4 14.4  ?HCT 45.2 42.5 43.8  ?MCV 92.8 94.4 97.1  ?PLT 262 236 156  ? ?Basic Metabolic Panel: ?Recent Labs  ?Lab 01/26/22 ?1324 01/29/22 ?0932  ?NA 137 130*  ?K 3.7 5.1  ?CL 102 97*  ?CO2 23 21*  ?GLUCOSE 181* 471*  ?BUN 10 21*  ?CREATININE 0.67 0.97  ?CALCIUM 9.3 9.0  ? ?GFR: ?Estimated Creatinine Clearance: 79.5 mL/min (by C-G formula based on SCr of 0.97 mg/dL). ?Liver Function Tests: ?No results for input(s): AST, ALT, ALKPHOS, BILITOT, PROT, ALBUMIN in the last 168 hours. ?No results for input(s): LIPASE, AMYLASE in the last 168 hours. ?No results for input(s): AMMONIA in the last 168 hours. ?Coagulation Profile: ?No results for input(s): INR, PROTIME in the last 168 hours. ?Cardiac Enzymes: ?No results for input(s): CKTOTAL, CKMB, CKMBINDEX, TROPONINI in the last 168 hours. ?BNP (last 3 results) ?No results for input(s): PROBNP in the last 8760 hours. ?HbA1C: ?No results for input(s): HGBA1C in the last 72 hours. ?CBG: ?No results for input(s): GLUCAP in the last 168 hours. ?Lipid Profile: ?No results for input(s): CHOL, HDL, LDLCALC, TRIG, CHOLHDL, LDLDIRECT in the last 72 hours. ?Thyroid Function Tests: ?No results for input(s): TSH, T4TOTAL, FREET4, T3FREE, THYROIDAB in the last 72 hours. ?Anemia Panel: ?No results for input(s): VITAMINB12, FOLATE, FERRITIN, TIBC, IRON, RETICCTPCT in the last 72 hours. ?Sepsis Labs: ?No results for input(s): PROCALCITON, LATICACIDVEN in the last 168 hours. ? ?Recent Results (from the past 240 hour(s))  ?Resp Panel by RT-PCR (Flu A&B, Covid) Nasopharyngeal Swab     Status: None  ? Collection  Time: 01/26/22  2:00 PM  ? Specimen: Nasopharyngeal Swab; Nasopharyngeal(NP) swabs in vial transport medium  ?Result Value Ref Range Status  ? SARS Coronavirus 2 by RT PCR NEGATIVE NEGATIVE Final  ?  Comment: (NOTE) ?SARS-CoV-2 target nucleic acids are NOT DETECTED. ? ?The SARS-CoV-2 RNA is generally detectable in upper respiratory ?specimens during the acute phase of infection. The lowest ?concentration of SARS-CoV-2 viral copies this assay can detect is ?138 copies/mL. A negative result does not preclude SARS-Cov-2 ?infection and should not be used as the sole basis for treatment or ?other patient management decisions. A negative result may occur with  ?improper specimen collection/handling, submission of specimen other ?than nasopharyngeal swab, presence of viral mutation(s) within the ?areas targeted by this assay, and inadequate number of viral ?copies(<138 copies/mL). A negative result must be combined with ?clinical observations, patient history, and epidemiological ?information. The expected result is Negative. ? ?Fact Sheet for Patients:  ?BloggerCourse.com ? ?Fact Sheet for Healthcare Providers:  ?SeriousBroker.it ? ?This test is no t yet approved or cleared by the [place] FDA and  ?has been  authorized for detection and/or diagnosis of SARS-CoV-2 by ?FDA under an Emergency Use Authorization (EUA). This EUA will remain  ?in effect (meaning this test can be used) for the duration of the ?COVID-19 declaration under Section 564(b)(1) of the Act, 21 ?U.S.C.section 360bbb-3(b)(1), unless the authorization is terminated  ?or revoked sooner.  ? ? ?  ? Influenza A by PCR NEGATIVE NEGATIVE Final  ? Influenza B by PCR NEGATIVE NEGATIVE Final  ?  Comment: (NOTE) ?The Xpert Xpress SARS-CoV-2/FLU/RSV plus assay is intended as an aid ?in the diagnosis of influenza from Nasopharyngeal swab specimens and ?should not be used as a sole basis for treatment. Nasal washings  and ?aspirates are unacceptable for Xpert Xpress SARS-CoV-2/FLU/RSV ?testing. ? ?Fact Sheet for Patients: ?BloggerCourse.com ? ?Fact Sheet for Healthcare Providers: ?https://www.fda.g

## 2022-02-01 ENCOUNTER — Inpatient Hospital Stay: Payer: Medicaid Other

## 2022-02-01 DIAGNOSIS — J9621 Acute and chronic respiratory failure with hypoxia: Secondary | ICD-10-CM | POA: Diagnosis not present

## 2022-02-01 LAB — HEMOGLOBIN A1C
Hgb A1c MFr Bld: 8.3 % — ABNORMAL HIGH (ref 4.8–5.6)
Mean Plasma Glucose: 191.51 mg/dL

## 2022-02-01 MED ORDER — LIDOCAINE 5 % EX PTCH
1.0000 | MEDICATED_PATCH | CUTANEOUS | Status: DC
  Administered 2022-02-02 – 2022-02-03 (×3): 1 via TRANSDERMAL
  Filled 2022-02-01 (×3): qty 1

## 2022-02-01 MED ORDER — LORATADINE 10 MG PO TABS
10.0000 mg | ORAL_TABLET | Freq: Every day | ORAL | Status: DC
  Administered 2022-02-01 – 2022-02-08 (×8): 10 mg via ORAL
  Filled 2022-02-01 (×8): qty 1

## 2022-02-01 MED ORDER — METHYLPREDNISOLONE SODIUM SUCC 40 MG IJ SOLR: 40.0000 mg | Freq: Two times a day (BID) | INTRAMUSCULAR | Status: DC

## 2022-02-01 MED ORDER — METHYLPREDNISOLONE SODIUM SUCC 125 MG IJ SOLR
80.0000 mg | INTRAMUSCULAR | Status: DC
  Administered 2022-02-01 – 2022-02-06 (×6): 80 mg via INTRAVENOUS
  Filled 2022-02-01 (×6): qty 2

## 2022-02-01 NOTE — Progress Notes (Signed)
PHARMACIST - PHYSICIAN COMMUNICATION ?  ?CONCERNING: Methylprednisolone IV  ?  ?Current order: Methylprednisolone IV 40mg  every 12 hours  ?  ?  ?DESCRIPTION: ?Per [place] Protocol:  ? ?IV methylprednisolone will be converted to either a q12h or q24h frequency with the same total daily dose (TDD). ? ?Ordered Dose: 1 to 125 mg TDD; convert to: TDD q24h.  ?Ordered Dose: 126 to 250 mg TDD; convert to: TDD div q12h.  ?Ordered Dose: >250 mg TDD; DAW. ? ?Order has been adjusted to: Methylprednisolone IV 80mg  every 24 hours  ? ? ? , PharmD, BCPS ?Clinical Pharmacist  ?02/01/2022 9:10 AM ? ?

## 2022-02-01 NOTE — Progress Notes (Signed)
Down to Radiology via w/c with orderly.Windy Carina, RN 02/01/2022 ? ?

## 2022-02-01 NOTE — Progress Notes (Signed)
?PROGRESS NOTE ? ? ? ?Christy Mayer  [id number] DOB: 05-13-1966 DOA: 01/26/2022 ?PCP: Olin Hauser, DO  ? ? ?Brief Narrative:  ? 56 y.o. female with medical history significant of COPD, asthma, not on oxygen normally, hypertension, hyperlipidemia, depression with anxiety, mitral valve prolapse, tobacco abuse, migraine headaches, obesity, who presents with shortness breath. ?  ?Patient states that she has shortness of breath for more than 4 days, which has been progressively worsening.  Patient has dry cough, wheezing, no fever or chills.  Patient states that she does not have chest pain at rest, but coughing induces some sharp front chest pain.  No nausea, vomiting, diarrhea or abdominal pain.  No symptoms of UTI. ?  ?Patient was found to have oxygen desaturation to 88% on room air, which improved to 94% on 4 L oxygen.  Patient has severe dry cough, with acute respiratory distress ? ?4/25: Patient's oxygen saturation has improved and she is now on 2 L however breath sounds have deteriorated over the last 24 hours.  Significant wheezing noted with dyspnea on minimal exertion and increasing oxygen demands. ? ? ?Assessment & Plan: ?  ?Principal Problem: ?  Acute on chronic respiratory failure with hypoxia ([place]) ?Active Problems: ?  CAP (community acquired pneumonia) ?  COPD exacerbation ([place]) ?  Tobacco use disorder ?  Migraine headache ?  Obesity (BMI 30-39.9) ?  Depression with anxiety ?  HTN (hypertension) ? ?Acute on chronic respiratory failure with hypoxia (HCC) ?Due to CAP and COPD exacerbation.  Patient does not meet criteria for sepsis. ?Was initially on 6 L, has been weaned to 2 as of 4/25 ?CT angio negative for PE ?Although oxygen demand has improved patient's breath sounds have worsened ?Plan: ?DC p.o. prednisone ?Restart Solu-Medrol 40 mg IV every 12 hours ?DC MetaNeb ?DuoNebs every 4 hours ?Twice daily Brovana ?Twice daily Pulmicort ?Completed azithromycin x3 days, Rocephin x5 days.  Will  discontinue antibiotics ?Incentive spirometry ?Supplemental oxygen, wean as tolerated ?Follow infectious panel, no growth to date ?Monitor vitals and fever curve ?Possible discharge in 24 to 48 hours ?  ?  ?COPD exacerbation ([place]) ?See above ?  ?CAP (community acquired pneumonia) ?See above ?  ?HTN (hypertension) ?Adequate control over interval ?- IV hydralazine as needed ?-Continue home clonidine, lisinopril ?-Ensure pain and anxiety control ?  ?Depression with anxiety ?- Continue home medications ?-As needed Xanax added ?  ?Obesity (BMI 30-39.9) ?BMI 35.25, body weight 102kg. BMI >35 + 1 comorbidity of hypertension --> pt has morbid obesity.  ?-Diet and exercise.   ?-Encouraged to lose weight. ?-RD consultation ?  ?Migraine headache ?- As needed sumatriptan ?-Continue home Lamictal ?  ?Tobacco abuse: ?-Did counseling about the importance of quitting drinking ?-Smoking cessation advised ?-NicoDerm patch offered ? ? ?DVT prophylaxis: SQ Lovenox ?Code Status: Full ?Family Communication: None today.  Offered to call but patient declined ?Disposition Plan: Status is: Inpatient ?Remains inpatient appropriate because: Acute hypoxic respiratory failure secondary to COPD/CAP.  Worsening of breath sounds.  Anticipate 48 hours prior to medical readiness for discharge ? ? ?Level of care: Med-Surg ? ?Consultants:  ?None ? ?Procedures:  ?None ? ?Antimicrobials: ? ? ?Subjective: ?Patient seen and examined.  Clinically appears improved.  On 2 L.  Still with significant wheezing ? ?Objective: ?Vitals:  ? 02/01/22 0436 02/01/22 0600 02/01/22 0747 02/01/22 0800  ?BP: 121/81  126/79   ?Pulse: 89  73   ?Resp: 20  18   ?Temp: 98.7 ?F (37.1 ?C)  98 ?F (  36.7 ?C)   ?TempSrc: Oral     ?SpO2: (!) 86% 90% 93% 91%  ?Weight:      ?Height:      ? ? ?Intake/Output Summary (Last 24 hours) at 02/01/2022 1315 ?Last data filed at 02/01/2022 1016 ?Gross per 24 hour  ?Intake 1120 ml  ?Output --  ?Net 1120 ml  ? ?Filed Weights  ? 01/26/22 1322  ?Weight:  102.1 kg  ? ? ?Examination: ? ?General exam: NAD ?Respiratory system: Improved air entry.  End expiratory wheeze.  Normal work of breathing.  2 L ?Cardiovascular system: S1-S2, RRR, no murmurs, no pedal edema ?Gastrointestinal system: Obese, soft, NT/ND, normal bowel sounds ?Central nervous system: Alert and oriented. No focal neurological deficits. ?Extremities: Symmetric 5 x 5 power. ?Skin: No rashes, lesions or ulcers ?Psychiatry: Judgement and insight appear normal. Mood & affect appropriate.  ? ? ? ?Data Reviewed: I have personally reviewed following labs and imaging studies ? ?CBC: ?Recent Labs  ?Lab 01/26/22 ?1324 01/27/22 ?0342 01/29/22 ?0932  ?WBC 7.7 8.9 11.6*  ?HGB 15.5* 14.4 14.4  ?HCT 45.2 42.5 43.8  ?MCV 92.8 94.4 97.1  ?PLT 262 236 156  ? ?Basic Metabolic Panel: ?Recent Labs  ?Lab 01/26/22 ?1324 01/29/22 ?0932  ?NA 137 130*  ?K 3.7 5.1  ?CL 102 97*  ?CO2 23 21*  ?GLUCOSE 181* 471*  ?BUN 10 21*  ?CREATININE 0.67 0.97  ?CALCIUM 9.3 9.0  ? ?GFR: ?Estimated Creatinine Clearance: 79.5 mL/min (by C-G formula based on SCr of 0.97 mg/dL). ?Liver Function Tests: ?No results for input(s): AST, ALT, ALKPHOS, BILITOT, PROT, ALBUMIN in the last 168 hours. ?No results for input(s): LIPASE, AMYLASE in the last 168 hours. ?No results for input(s): AMMONIA in the last 168 hours. ?Coagulation Profile: ?No results for input(s): INR, PROTIME in the last 168 hours. ?Cardiac Enzymes: ?No results for input(s): CKTOTAL, CKMB, CKMBINDEX, TROPONINI in the last 168 hours. ?BNP (last 3 results) ?No results for input(s): PROBNP in the last 8760 hours. ?HbA1C: ?Recent Labs  ?  02/01/22 ?0328  ?HGBA1C 8.3*  ? ?CBG: ?No results for input(s): GLUCAP in the last 168 hours. ?Lipid Profile: ?No results for input(s): CHOL, HDL, LDLCALC, TRIG, CHOLHDL, LDLDIRECT in the last 72 hours. ?Thyroid Function Tests: ?No results for input(s): TSH, T4TOTAL, FREET4, T3FREE, THYROIDAB in the last 72 hours. ?Anemia Panel: ?No results for input(s):  VITAMINB12, FOLATE, FERRITIN, TIBC, IRON, RETICCTPCT in the last 72 hours. ?Sepsis Labs: ?No results for input(s): PROCALCITON, LATICACIDVEN in the last 168 hours. ? ?Recent Results (from the past 240 hour(s))  ?Resp Panel by RT-PCR (Flu A&B, Covid) Nasopharyngeal Swab     Status: None  ? Collection Time: 01/26/22  2:00 PM  ? Specimen: Nasopharyngeal Swab; Nasopharyngeal(NP) swabs in vial transport medium  ?Result Value Ref Range Status  ? SARS Coronavirus 2 by RT PCR NEGATIVE NEGATIVE Final  ?  Comment: (NOTE) ?SARS-CoV-2 target nucleic acids are NOT DETECTED. ? ?The SARS-CoV-2 RNA is generally detectable in upper respiratory ?specimens during the acute phase of infection. The lowest ?concentration of SARS-CoV-2 viral copies this assay can detect is ?138 copies/mL. A negative result does not preclude SARS-Cov-2 ?infection and should not be used as the sole basis for treatment or ?other patient management decisions. A negative result may occur with  ?improper specimen collection/handling, submission of specimen other ?than nasopharyngeal swab, presence of viral mutation(s) within the ?areas targeted by this assay, and inadequate number of viral ?copies(<138 copies/mL). A negative result must be combined with ?  clinical observations, patient history, and epidemiological ?information. The expected result is Negative. ? ?Fact Sheet for Patients:  ?EntrepreneurPulse.com.au ? ?Fact Sheet for Healthcare Providers:  ?IncredibleEmployment.be ? ?This test is no t yet approved or cleared by the [place] FDA and  ?has been authorized for detection and/or diagnosis of SARS-CoV-2 by ?FDA under an Emergency Use Authorization (EUA). This EUA will remain  ?in effect (meaning this test can be used) for the duration of the ?COVID-19 declaration under Section 564(b)(1) of the Act, 21 ?U.S.C.section 360bbb-3(b)(1), unless the authorization is terminated  ?or revoked sooner.  ? ? ?  ? Influenza A  by PCR NEGATIVE NEGATIVE Final  ? Influenza B by PCR NEGATIVE NEGATIVE Final  ?  Comment: (NOTE) ?The Xpert Xpress SARS-CoV-2/FLU/RSV plus assay is intended as an aid ?in the diagnosis of influenza from N

## 2022-02-01 NOTE — Progress Notes (Signed)
Mobility Specialist - Progress Note ? ? 02/01/22 1100  ?Mobility  ?Activity Ambulated independently in hallway  ?Level of Assistance Modified independent, requires aide device or extra time  ?Assistive Device None  ?Distance Ambulated (ft) 160 ft  ?Activity Response Tolerated well  ?$Mobility charge 1 Mobility  ? ? ? ?Pre-mobility: 83 HR, 85% SpO2 ?During mobility: 107 HR, 90% SpO2 ?Post-mobility: 98 HR, 89-90% SpO2 ? ? ?Pt lying in bed upon arrival, utilizing 2L. Pt voiced fatigue and SOB---O2 satting at 85% and was bumped up to 4L to achieve sats > 88%. Pt ambulated hallway modI, some dizziness while upright but does resolve. Upon return to room, pt required O2 increase to 5L to maintain sats of 89-90% with extensive recovery time. Pt left in bed with needs in reach. RN notified.  ? ? ?Filiberto Pinks ?Mobility Specialist ?02/01/22, 12:15 PM ? ?  ? ?

## 2022-02-02 DIAGNOSIS — E871 Hypo-osmolality and hyponatremia: Secondary | ICD-10-CM

## 2022-02-02 DIAGNOSIS — J9601 Acute respiratory failure with hypoxia: Secondary | ICD-10-CM | POA: Diagnosis not present

## 2022-02-02 DIAGNOSIS — Z794 Long term (current) use of insulin: Secondary | ICD-10-CM

## 2022-02-02 DIAGNOSIS — J441 Chronic obstructive pulmonary disease with (acute) exacerbation: Secondary | ICD-10-CM | POA: Diagnosis not present

## 2022-02-02 DIAGNOSIS — E1169 Type 2 diabetes mellitus with other specified complication: Secondary | ICD-10-CM

## 2022-02-02 DIAGNOSIS — E1165 Type 2 diabetes mellitus with hyperglycemia: Secondary | ICD-10-CM

## 2022-02-02 DIAGNOSIS — J189 Pneumonia, unspecified organism: Secondary | ICD-10-CM | POA: Diagnosis not present

## 2022-02-02 LAB — GLUCOSE, CAPILLARY
Glucose-Capillary: 580 mg/dL (ref 70–99)
Glucose-Capillary: 475 mg/dL — ABNORMAL HIGH (ref 70–99)
Glucose-Capillary: 544 mg/dL (ref 70–99)
Glucose-Capillary: 430 mg/dL — ABNORMAL HIGH (ref 70–99)
Glucose-Capillary: 263 mg/dL — ABNORMAL HIGH (ref 70–99)

## 2022-02-02 LAB — MRSA NEXT GEN BY PCR, NASAL: MRSA by PCR Next Gen: NOT DETECTED

## 2022-02-02 MED ORDER — PIPERACILLIN-TAZOBACTAM 3.375 G IVPB
3.3750 g | Freq: Three times a day (TID) | INTRAVENOUS | Status: DC
  Administered 2022-02-02 – 2022-02-08 (×18): 3.375 g via INTRAVENOUS
  Filled 2022-02-02 (×18): qty 50

## 2022-02-02 MED ORDER — INSULIN GLARGINE-YFGN 100 UNIT/ML ~~LOC~~ SOLN
15.0000 [IU] | Freq: Every day | SUBCUTANEOUS | Status: DC
  Administered 2022-02-02: 15 [IU] via SUBCUTANEOUS
  Filled 2022-02-02 (×2): qty 0.15

## 2022-02-02 MED ORDER — INSULIN ASPART 100 UNIT/ML IJ SOLN: 3.0000 [IU] | Freq: Three times a day (TID) | INTRAMUSCULAR | Status: DC

## 2022-02-02 MED ORDER — INSULIN ASPART 100 UNIT/ML IJ SOLN
5.0000 [IU] | Freq: Three times a day (TID) | INTRAMUSCULAR | Status: DC
Start: 2022-02-02 — End: 2022-02-08
  Administered 2022-02-02 – 2022-02-08 (×18): 5 [IU] via SUBCUTANEOUS
  Filled 2022-02-02 (×18): qty 1

## 2022-02-02 MED ORDER — SODIUM CHLORIDE 0.9 % IV BOLUS
500.0000 mL | Freq: Once | INTRAVENOUS | Status: AC
  Administered 2022-02-02: 500 mL via INTRAVENOUS

## 2022-02-02 MED ORDER — INSULIN ASPART 100 UNIT/ML IJ SOLN
0.0000 [IU] | Freq: Three times a day (TID) | INTRAMUSCULAR | Status: DC
  Administered 2022-02-02 (×2): 9 [IU] via SUBCUTANEOUS
  Administered 2022-02-03: 7 [IU] via SUBCUTANEOUS
  Administered 2022-02-03: 9 [IU] via SUBCUTANEOUS
  Filled 2022-02-02 (×4): qty 1

## 2022-02-02 MED ORDER — DOXYCYCLINE HYCLATE 100 MG PO TABS
100.0000 mg | ORAL_TABLET | Freq: Two times a day (BID) | ORAL | Status: DC
  Administered 2022-02-02 – 2022-02-07 (×12): 100 mg via ORAL
  Filled 2022-02-02 (×12): qty 1

## 2022-02-02 MED ORDER — INSULIN ASPART 100 UNIT/ML IJ SOLN
16.0000 [IU] | Freq: Once | INTRAMUSCULAR | Status: AC
  Administered 2022-02-02: 16 [IU] via SUBCUTANEOUS
  Filled 2022-02-02: qty 1

## 2022-02-02 MED ORDER — INSULIN ASPART 100 UNIT/ML IJ SOLN
0.0000 [IU] | Freq: Every day | INTRAMUSCULAR | Status: DC
  Administered 2022-02-02: 3 [IU] via SUBCUTANEOUS
  Administered 2022-02-03: 4 [IU] via SUBCUTANEOUS
  Administered 2022-02-04: 2 [IU] via SUBCUTANEOUS
  Administered 2022-02-05: 3 [IU] via SUBCUTANEOUS
  Administered 2022-02-06: 2 [IU] via SUBCUTANEOUS
  Administered 2022-02-07: 3 [IU] via SUBCUTANEOUS
  Filled 2022-02-02 (×6): qty 1

## 2022-02-02 MED ORDER — SODIUM CHLORIDE 0.9 % IV SOLN: INTRAVENOUS | Status: DC | PRN

## 2022-02-02 NOTE — Progress Notes (Addendum)
?Progress Note ? ? ?Patient: Christy Mayer GYK:[id number] DOB: 1966/04/26 DOA: 01/26/2022     7 ?DOS: the patient was seen and examined on 02/02/2022 ? ? ?Assessment and Plan: ?* Acute respiratory failure with hypoxia (HCC) ?On 02/01/2022 pulse ox of 86% on 5 L.  Patient was advanced to high flow nasal cannula.  This morning on 10 L high flow nasal cannula. ? ?COPD exacerbation (HCC) ?Continue Solu-Medrol 80 mg daily and nebulizer treatments.  Continue incentive spirometry.  Patient with poor air entry. ? ?CAP (community acquired pneumonia) ?Restarted antibiotics with Zosyn and doxycycline at this point. ? ?Uncontrolled type 2 diabetes mellitus with hyperglycemia, with long-term current use of insulin (HCC) ?With being on steroids sugars are elevated.  Hemoglobin A1c elevated 8.3 which goes along with an average sugar above 180.  Start Semglee insulin 15 units daily.  Short acting insulin prior to meals plus sliding scale. ? ?Hyponatremia ?Last sodium 130.  Recheck BMP in the morning. ? ?HTN (hypertension) ?Continue home clonidine, lisinopril ? ?Depression with anxiety ? Continue home medications ? ?Obesity (BMI 30-39.9) ?BMI 35.25 ? ? ? ?Migraine headache ?- As needed sumatriptan ?-Continue home Lamictal ? ?Tobacco use disorder ?Tobacco abuse: ?-Did counseling about the importance of quitting drinking ?-CIWA protocol ? ? ? ? ? ?  ? ?Subjective: Patient not feeling well.  Complains of shortness of breath and cough which is dry.  She was advanced to high flow nasal cannula yesterday.  This morning on 10 L high flow nasal cannula. ? ?Physical Exam: ?Vitals:  ? 02/02/22 0113 02/02/22 0442 02/02/22 0709 02/02/22 0839  ?BP:  114/74  106/65  ?Pulse:  66  74  ?Resp:  20  18  ?Temp:  98 ?F (36.7 ?C)  97.6 ?F (36.4 ?C)  ?TempSrc:  Oral  Oral  ?SpO2: 95% 93% 90% 92%  ?Weight:      ?Height:      ? ?Physical Exam ?HENT:  ?   Head: Normocephalic.  ?   Mouth/Throat:  ?   Pharynx: No oropharyngeal exudate.  ?Eyes:  ?   General:  Lids are normal.  ?   Conjunctiva/sclera: Conjunctivae normal.  ?Cardiovascular:  ?   Rate and Rhythm: Normal rate and regular rhythm.  ?   Heart sounds: Normal heart sounds, S1 normal and S2 normal.  ?Pulmonary:  ?   Breath sounds: Examination of the right-upper field reveals decreased breath sounds. Examination of the left-upper field reveals decreased breath sounds. Examination of the right-middle field reveals decreased breath sounds. Examination of the left-middle field reveals decreased breath sounds. Examination of the right-lower field reveals decreased breath sounds and wheezing. Examination of the left-lower field reveals decreased breath sounds and wheezing. Decreased breath sounds and wheezing present. No rhonchi or rales.  ?Abdominal:  ?   Palpations: Abdomen is soft.  ?   Tenderness: There is no abdominal tenderness.  ?Musculoskeletal:  ?   Right lower leg: No swelling.  ?   Left lower leg: No swelling.  ?Skin: ?   General: Skin is warm.  ?   Findings: No rash.  ?Neurological:  ?   Mental Status: She is alert and oriented to person, place, and time.  ?  ?Data Reviewed: ?Last sodium 130.  Last white count 11.6 ? ?Family Communication: Declined ? ?Disposition: ?Status is: Inpatient ?Remains inpatient appropriate because: Patient is on 10 L high flow nasal cannula with worsening respiratory status. ? Planned Discharge Destination: Home ? ?Author: ?Alford Highland, MD ?02/02/2022 1:20 PM ? ?  For on call review [email or web address].  ?

## 2022-02-02 NOTE — Assessment & Plan Note (Addendum)
With being on steroids sugars are elevated.  Hemoglobin A1c elevated 8.3 which goes along with an average sugar above 180.  Continue Semglee insulin 20 units daily today. Short acting insulin prior to meals plus increase to high-dose sliding scale ?

## 2022-02-02 NOTE — Consult Note (Signed)
Pharmacy Antibiotic Note ? ?[name] is a 56 y.o. female admitted on 01/26/2022 with  shortness of breath, CAP vx COPDE . Patient received Azithromycin and Rocephin for 5 days, but repeat chest x-ray showing small bilateral effusions for which the attending is worried could be pneumonia. Pharmacy has been consulted for Zosyn dosing. ? ?Plan: ?Zosyn 3.375g IV q8h (4 hour infusion). ? ?Height: 5\' 7"  (170.2 cm) ?Weight: 102.1 kg (225 lb 1.4 oz) ?IBW/kg (Calculated) : 61.6 ? ?Temp (24hrs), Avg:98.3 ?F (36.8 ?C), Min:97.6 ?F (36.4 ?C), Max:98.7 ?F (37.1 ?C) ? ?Recent Labs  ?Lab 01/26/22 ?1324 01/27/22 ?0342 01/29/22 ?0932  ?WBC 7.7 8.9 11.6*  ?CREATININE 0.67  --  0.97  ?  ?Estimated Creatinine Clearance: 79.5 mL/min (by C-G formula based on SCr of 0.97 mg/dL).   ? ?Allergies  ?Allergen Reactions  ? Aspirin Nausea Only  ? ? ?Antimicrobials this admission: ?Azithro/Rocephin 4/19 >> 4/23 ?Doxy/Rocephin 4/26 >>  ? ?Dose adjustments this admission: N/A ? ?Microbiology results: ?4/19 BCx: NGF ?4/26 Respiratory panel: sent  ?4/26 MRSA PCR: negative ? ?Thank you for allowing pharmacy to be a part of this patient?s care. ? ?5/26 ?02/02/2022 11:56 AM ? ?

## 2022-02-02 NOTE — Progress Notes (Signed)
MD notified for BG430.  Instructions given to give 9 units plus meal 5 units for total 14 unit ofinsulin.  Encouraged patient to continue drinking water and refrain from juice or soda.  ?

## 2022-02-02 NOTE — Assessment & Plan Note (Addendum)
On 02/01/2022 pulse ox of 86% on 5 L.  Patient was advanced to high flow nasal cannula.  The patient was as high as 15 L high flow nasal cannula.  This morning was on 5 L nasal cannula with good saturations and down down to 3 L.  I asked nursing staff to check a pulse ox on room air and with ambulation. ?

## 2022-02-02 NOTE — Assessment & Plan Note (Addendum)
Last sodium normal range at 139. ?

## 2022-02-02 NOTE — Progress Notes (Signed)
Inpatient Diabetes Program Recommendations ? ?AACE/ADA: New Consensus Statement on Inpatient Glycemic Control ? ?Target Ranges:  Prepandial:   less than 140 mg/dL ?     Peak postprandial:   less than 180 mg/dL (1-2 hours) ?     Critically ill patients:  140 - 180 mg/dL  ? ? Latest Reference Range & Units 02/02/22 11:25 02/02/22 12:03  ?Glucose-Capillary 70 - 99 mg/dL 863 (HH) 817 (H)  ? ? Latest Reference Range & Units 01/26/22 13:24  ?Glucose 70 - 99 mg/dL 711 (H)  ? ? Latest Reference Range & Units 07/30/18 11:06 02/01/22 03:28  ?Hemoglobin A1C 4.8 - 5.6 % 5.6 8.3 (H)  ? ?Review of Glycemic Control ? ?Diabetes history: No ?Outpatient Diabetes medications: NA ?Current orders for Inpatient glycemic control: Semglee 15 units daily, Novolog 0-9 units TID with meals, Novolog 0-5 units QHS, Novolog 5 units TID with meals; Solumedrol 80 mg Q24H ? ?Inpatient Diabetes Program Recommendations:   ? ?HbgA1C: A1C 8.3% on 02/01/22 indicating an average glucose of 192 mg/dl over the past 2-3 months. If patient will be diagnosed with DM please inform patient and bedside nursing and consult diabetes coordinator. ? ?NOTE: Per chart, patient admitted on 01/26/22 and initial lab glucose 181 mg/dl on 6/57/90. Patient has received steroids since admitted 6 days ago. Current A1C 8.3% on 02/01/22. Noted an A1C on 07/30/2018 of 5.6%. Per office note by PCP on 08/06/2018, elevated A1C was noted and communicated to patient and was encouraged improved lifestyle - low carb, low sugar diet, reduce portion size, continue improving regular exercise. No repeat A1C in chart since then until 02/01/22. If patient will be newly dx with DM, please inform patient and bedside nursing and also consult diabetes coordinator.  ? ?Thanks, ?Orlando Penner, RN, MSN, CDE ?Diabetes Coordinator ?Inpatient Diabetes Program ?[phone] (Team Pager from 8am to 5pm) ? ? ?

## 2022-02-03 DIAGNOSIS — J9601 Acute respiratory failure with hypoxia: Secondary | ICD-10-CM | POA: Diagnosis not present

## 2022-02-03 DIAGNOSIS — J189 Pneumonia, unspecified organism: Secondary | ICD-10-CM | POA: Diagnosis not present

## 2022-02-03 DIAGNOSIS — E871 Hypo-osmolality and hyponatremia: Secondary | ICD-10-CM

## 2022-02-03 DIAGNOSIS — E1165 Type 2 diabetes mellitus with hyperglycemia: Secondary | ICD-10-CM | POA: Diagnosis not present

## 2022-02-03 DIAGNOSIS — J441 Chronic obstructive pulmonary disease with (acute) exacerbation: Secondary | ICD-10-CM | POA: Diagnosis not present

## 2022-02-03 LAB — RESPIRATORY PANEL BY PCR

## 2022-02-03 LAB — BASIC METABOLIC PANEL
Anion gap: 7 (ref 5–15)
BUN: 27 mg/dL — ABNORMAL HIGH (ref 6–20)
CO2: 32 mmol/L (ref 22–32)
Calcium: 9.3 mg/dL (ref 8.9–10.3)
Chloride: 94 mmol/L — ABNORMAL LOW (ref 98–111)
Creatinine, Ser: 0.91 mg/dL (ref 0.44–1.00)
GFR, Estimated: >60 mL/min (ref >60)
Glucose, Bld: 290 mg/dL — ABNORMAL HIGH (ref 70–99)
Potassium: 4.4 mmol/L (ref 3.5–5.1)
Sodium: 133 mmol/L — ABNORMAL LOW (ref 135–145)

## 2022-02-03 LAB — CBC
HCT: 43.4 % (ref 36.0–46.0)
Hemoglobin: 14.7 g/dL (ref 12.0–15.0)
MCH: 31.9 pg (ref 26.0–34.0)
MCHC: 33.9 g/dL (ref 30.0–36.0)
MCV: 94.1 fL (ref 80.0–100.0)
Platelets: 213 10*3/uL (ref 150–400)
RBC: 4.61 MIL/uL (ref 3.87–5.11)
RDW: 11.7 % (ref 11.5–15.5)
WBC: 11.3 10*3/uL — ABNORMAL HIGH (ref 4.0–10.5)
nRBC: 0 % (ref 0.0–0.2)

## 2022-02-03 LAB — GLUCOSE, CAPILLARY
Glucose-Capillary: 396 mg/dL — ABNORMAL HIGH (ref 70–99)
Glucose-Capillary: 327 mg/dL — ABNORMAL HIGH (ref 70–99)
Glucose-Capillary: 438 mg/dL — ABNORMAL HIGH (ref 70–99)
Glucose-Capillary: 452 mg/dL — ABNORMAL HIGH (ref 70–99)
Glucose-Capillary: 336 mg/dL — ABNORMAL HIGH (ref 70–99)

## 2022-02-03 MED ORDER — FLUTICASONE PROPIONATE 50 MCG/ACT NA SUSP
2.0000 | Freq: Every day | NASAL | Status: DC
  Administered 2022-02-03 – 2022-02-04 (×2): 2 via NASAL
  Filled 2022-02-03: qty 16

## 2022-02-03 MED ORDER — INSULIN GLARGINE-YFGN 100 UNIT/ML ~~LOC~~ SOLN
20.0000 [IU] | Freq: Every day | SUBCUTANEOUS | Status: DC
  Administered 2022-02-03: 20 [IU] via SUBCUTANEOUS
  Filled 2022-02-03 (×2): qty 0.2

## 2022-02-03 MED ORDER — INSULIN ASPART 100 UNIT/ML IJ SOLN
0.0000 [IU] | Freq: Three times a day (TID) | INTRAMUSCULAR | Status: DC
  Administered 2022-02-03: 20 [IU] via SUBCUTANEOUS
  Administered 2022-02-04: 11 [IU] via SUBCUTANEOUS
  Administered 2022-02-04: 7 [IU] via SUBCUTANEOUS
  Administered 2022-02-04: 15 [IU] via SUBCUTANEOUS
  Administered 2022-02-05 (×2): 7 [IU] via SUBCUTANEOUS
  Administered 2022-02-05: 11 [IU] via SUBCUTANEOUS
  Administered 2022-02-06: 15 [IU] via SUBCUTANEOUS
  Administered 2022-02-06: 7 [IU] via SUBCUTANEOUS
  Administered 2022-02-06: 15 [IU] via SUBCUTANEOUS
  Administered 2022-02-07: 4 [IU] via SUBCUTANEOUS
  Administered 2022-02-07 (×2): 15 [IU] via SUBCUTANEOUS
  Administered 2022-02-08: 7 [IU] via SUBCUTANEOUS
  Administered 2022-02-08: 15 [IU] via SUBCUTANEOUS
  Filled 2022-02-03 (×16): qty 1

## 2022-02-03 NOTE — Plan of Care (Signed)
Nutrition Education Note  ? ?RD consulted for nutrition education regarding diabetes.  ? ?Lab Results  ?Component Value Date  ? HGBA1C 8.3 (H) 02/01/2022  ? ? ?RD provided "Nutrition and Type II Diabetes" handout from the Academy of [employer] and Dietetics. Discussed different food groups and their effects on blood sugar, emphasizing carbohydrate-containing foods. Provided list of carbohydrates and recommended serving sizes of common foods. ? ?Discussed importance of controlled and consistent carbohydrate intake throughout the day. Provided examples of ways to balance meals/snacks and encouraged intake of high-fiber, whole grain complex carbohydrates. Teach back method used. ? ?Expect good compliance. ? ?Body mass index is 35.25 kg/m?Marland Kitchen Pt meets criteria for obesity based on current BMI. ? ?RD following this pt ? ?Koleen Distance MS, RD, LDN ?Please refer to [facility] for RD and/or RD on-call/weekend/after hours pager ? ? ? ?

## 2022-02-03 NOTE — Progress Notes (Addendum)
Nutrition Follow Up Note  ? ?DOCUMENTATION CODES:  ? ?Obesity unspecified ? ?INTERVENTION:  ? ?Ensure Max protein supplement BID, each supplement provides 150kcal and 30g of protein. ? ?MVI po daily  ? ?Diabetic diet education  ? ?NUTRITION DIAGNOSIS:  ? ?Increased nutrient needs related to catabolic illness (COPD) as evidenced by estimated needs. ? ?GOAL:  ? ?Patient will meet greater than or equal to 90% of their needs ?-met  ? ?MONITOR:  ? ?PO intake, Supplement acceptance, Labs, Weight trends, Skin, I & O's ? ?ASSESSMENT:  ? ?56 y/o female with h/o COPD, MDD, anxiety, HTN and HLD who is admitted with CAP and COPD exacerbation. ? ?Met with pt in room today. Pt reports continued good appetite and oral intake in hospital; pt eating 100% of meals and is drinking Ensure supplements. No new weight since admit; will order weekly weights. No BM since 4/20; bowel regimen initiated. RD provided pt with diabetic diet education today.  ? ?Medications reviewed and include: doxycycline, lovenox, insulin, solu-medrol, MVI, nicotine, miralax, senokot, zosyn  ? ?Labs reviewed: Na 133(L), K 4.4 wnl, BUN 27(H) ?Wbc- 11.3(H) ?Cbgs- 327, 396 x 24 hrs ?AIC 8.3(H)- 4/25 ? ?Diet Order:   ? ?Diet Order   ? ?       ?  Diet heart healthy/carb modified Room service appropriate? Yes; Fluid consistency: Thin  Diet effective now       ?  ? ?  ?  ? ?  ? ?EDUCATION NEEDS:  ? ?Education needs have been addressed ? ?Skin:  Skin Assessment: Reviewed RN Assessment ? ?Last BM:  4/20 ? ?Height:  ? ?Ht Readings from Last 1 Encounters:  ?01/26/22 '5\' 7"'  (1.702 m)  ? ? ?Weight:  ? ?Wt Readings from Last 1 Encounters:  ?01/26/22 102.1 kg  ? ? ?Ideal Body Weight:  61.36 kg ? ?BMI:  Body mass index is 35.25 kg/m?. ? ?Estimated Nutritional Needs:  ? ?Kcal:  2000-2300kcal/day ? ?Protein:  100-115g/day ? ?Fluid:  1.9-2.2L/day ? ?Koleen Distance MS, RD, LDN ?Please refer to [facility] for RD and/or RD on-call/weekend/after hours pager ? ?

## 2022-02-03 NOTE — Progress Notes (Signed)
Inpatient Diabetes Program Recommendations ? ?AACE/ADA: New Consensus Statement on Inpatient Glycemic Control (2015) ? ?Target Ranges:  Prepandial:   less than 140 mg/dL ?     Peak postprandial:   less than 180 mg/dL (1-2 hours) ?     Critically ill patients:  140 - 180 mg/dL  ? ?Lab Results  ?Component Value Date  ? GLUCAP 327 (H) 02/03/2022  ? HGBA1C 8.3 (H) 02/01/2022  ? ? ?Review of Glycemic Control ? Latest Reference Range & Units 02/03/22 09:12 02/03/22 11:57  ?Glucose-Capillary 70 - 99 mg/dL 332 (H) 951 (H)  ?(H): Data is abnormally high ? ?Current orders for Inpatient glycemic control: Semglee 20 units QD, Novolog 0-9 units TID and 0-5 units QHS, Novolog 5 units TID with meals, Ssolumedrol 80 mg Q24H ? ?Inpatient Diabetes Program Recommendations:   ? ?Noted Semglee increased today to 20 units.  Please also consider, Novolog 0-20 units TID while receiving steroids.   ? ?Will continue to follow while inpatient. ? ?Thank you, ?Dulce Sellar, MSN, RN ?Diabetes Coordinator ?Inpatient Diabetes Program ?[phone] (team pager from 8a-5p) ? ? ? ? ? ?

## 2022-02-03 NOTE — Progress Notes (Signed)
?Progress Note ? ? ?Patient: Christy Mayer [id number] DOB: [birth year] DOA: 01/26/2022     8 ?DOS: the patient was seen and examined on 02/03/2022 ?  ? ? ?Assessment and Plan: ?* Acute respiratory failure with hypoxia (HCC) ?On 02/01/2022 pulse ox of 86% on 5 L.  Patient was advanced to high flow nasal cannula.  The patient was as high as 15 L high flow nasal cannula.  Yesterday was on 10 L high flow nasal cannula.  This morning on 8 L high flow nasal cannula. ? ?COPD exacerbation ([place]) ?Continue Solu-Medrol 80 mg daily and nebulizer treatments.  Continue incentive spirometry.  The patient still with poor air entry but slightly better air entry than yesterday. ? ?CAP (community acquired pneumonia) ?Restarted antibiotics with Zosyn and doxycycline starting on 02/02/2022. ? ?Uncontrolled type 2 diabetes mellitus with hyperglycemia, with long-term current use of insulin ([place]) ?With being on steroids sugars are elevated.  Hemoglobin A1c elevated 8.3 which goes along with an average sugar above 180.  Increase Semglee insulin 20 units daily.  Short acting insulin prior to meals plus increase to high-dose sliding scale ? ?Hyponatremia ?Last sodium 133. ? ?HTN (hypertension) ?Continue home lisinopril ? ?Depression with anxiety ? Continue home medications ? ?Obesity (BMI 30-39.9) ?BMI 35.25 ? ? ? ?Migraine headache ?- As needed sumatriptan ?-Continue home Lamictal ? ?Tobacco use disorder ? ? ? ? ? ? ?  ? ?Subjective: Patient not feeling any better today.  Still with some shortness of breath and cough and chest pain when taking a deep breath.  Initially admitted 8 days ago with acute respiratory failure, COPD exacerbation and pneumonia ? ?Physical Exam: ?Vitals:  ? 02/02/22 2056 02/03/22 0235 02/03/22 0439 02/03/22 [id number]  ?BP: 118/72  109/68 106/63  ?Pulse: 71  67 75  ?Resp: 18  20 19   ?Temp: 98 ?F (36.7 ?C)  97.7 ?F (36.5 ?C) 98.8 ?F (37.1 ?C)  ?TempSrc: Oral  Oral Oral  ?SpO2: 94% 93% 93% 94%  ?Weight:      ?Height:       ? ?Physical Exam ?HENT:  ?   Head: Normocephalic.  ?   Mouth/Throat:  ?   Pharynx: No oropharyngeal exudate.  ?Eyes:  ?   General: Lids are normal.  ?   Conjunctiva/sclera: Conjunctivae normal.  ?Cardiovascular:  ?   Rate and Rhythm: Normal rate and regular rhythm.  ?   Heart sounds: Normal heart sounds, S1 normal and S2 normal.  ?Pulmonary:  ?   Breath sounds: Examination of the right-middle field reveals decreased breath sounds. Examination of the left-middle field reveals decreased breath sounds. Examination of the right-lower field reveals decreased breath sounds and rhonchi. Examination of the left-lower field reveals decreased breath sounds and rhonchi. Decreased breath sounds and rhonchi present. No wheezing or rales.  ?Abdominal:  ?   Palpations: Abdomen is soft.  ?   Tenderness: There is no abdominal tenderness.  ?Musculoskeletal:  ?   Right lower leg: No swelling.  ?   Left lower leg: No swelling.  ?Skin: ?   General: Skin is warm.  ?   Findings: No rash.  ?Neurological:  ?   Mental Status: She is alert and oriented to person, place, and time.  ?  ?Data Reviewed: ?Sodium 133, sugars still elevated at 396 on 327 ? ?Disposition: ?Status is: Inpatient ?Remains inpatient appropriate because: Patient is still on high flow nasal cannula 8 L today.  Will need to move better air prior to disposition ? ?Planned  Discharge Destination: Home ? ?Author: ?Loletha Grayer, MD ?02/03/2022 2:52 PM ? ?For on call review [email or web address].  ?

## 2022-02-03 NOTE — Plan of Care (Signed)
?  Problem: Activity: Goal: Ability to tolerate increased activity will improve Outcome: Progressing Goal: Will verbalize the importance of balancing activity with adequate rest periods Outcome: Progressing   Problem: Respiratory: Goal: Ability to maintain a clear airway will improve Outcome: Progressing Goal: Levels of oxygenation will improve Outcome: Progressing Goal: Ability to maintain adequate ventilation will improve Outcome: Progressing   Problem: Activity: Goal: Ability to tolerate increased activity will improve Outcome: Progressing   

## 2022-02-04 DIAGNOSIS — E1165 Type 2 diabetes mellitus with hyperglycemia: Secondary | ICD-10-CM | POA: Diagnosis not present

## 2022-02-04 DIAGNOSIS — J441 Chronic obstructive pulmonary disease with (acute) exacerbation: Secondary | ICD-10-CM | POA: Diagnosis not present

## 2022-02-04 DIAGNOSIS — J9601 Acute respiratory failure with hypoxia: Secondary | ICD-10-CM | POA: Diagnosis not present

## 2022-02-04 DIAGNOSIS — J189 Pneumonia, unspecified organism: Secondary | ICD-10-CM | POA: Diagnosis not present

## 2022-02-04 LAB — GLUCOSE, CAPILLARY
Glucose-Capillary: 217 mg/dL — ABNORMAL HIGH (ref 70–99)
Glucose-Capillary: 277 mg/dL — ABNORMAL HIGH (ref 70–99)
Glucose-Capillary: 325 mg/dL — ABNORMAL HIGH (ref 70–99)
Glucose-Capillary: 202 mg/dL — ABNORMAL HIGH (ref 70–99)

## 2022-02-04 MED ORDER — FLUTICASONE PROPIONATE 50 MCG/ACT NA SUSP
2.0000 | Freq: Two times a day (BID) | NASAL | Status: DC
  Administered 2022-02-04 – 2022-02-08 (×8): 2 via NASAL
  Filled 2022-02-04: qty 16

## 2022-02-04 MED ORDER — LIVING WELL WITH DIABETES BOOK
Freq: Once | Status: AC
  Filled 2022-02-04: qty 1

## 2022-02-04 MED ORDER — INSULIN GLARGINE-YFGN 100 UNIT/ML ~~LOC~~ SOLN
25.0000 [IU] | Freq: Every day | SUBCUTANEOUS | Status: DC
  Administered 2022-02-04 – 2022-02-06 (×3): 25 [IU] via SUBCUTANEOUS
  Filled 2022-02-04 (×3): qty 0.25

## 2022-02-04 NOTE — Progress Notes (Addendum)
Inpatient Diabetes Program Recommendations ? ?AACE/ADA: New Consensus Statement on Inpatient Glycemic Control (2015) ? ?Target Ranges:  Prepandial:   less than 140 mg/dL ?     Peak postprandial:   less than 180 mg/dL (1-2 hours) ?     Critically ill patients:  140 - 180 mg/dL  ? ? Latest Reference Range & Units 02/04/22 08:34 02/04/22 11:59  ?Glucose-Capillary 70 - 99 mg/dL 546 (H) 270 (H)  ?(H): Data is abnormally high ? Latest Reference Range & Units 02/01/22 03:28  ?Hemoglobin A1C 4.8 - 5.6 % 8.3 (H)  ?(H): Data is abnormally high ? ? ?Admit with ?Acute respiratory failure with hypoxia  ?COPD exacerbation ?CAP (community acquired pneumonia) ?New Diagnosis Diabetes ? ? ?Current Orders: Semglee 25 units Daily ?     Novolog Resistant Correction Scale/ SSI (0-20 units) TID AC + HS ?     Novolog 5 units TID with meals ? ? ? ?Spoke with pt about new diagnosis.  Discussed A1C results with her and explained what an A1C is, basic pathophysiology of DM Type 2, basic home care, basic diabetes diet nutrition principles, importance of checking CBGs and maintaining good CBG control to prevent long-term and short-term complications.  Reviewed signs and symptoms of hyperglycemia and hypoglycemia and how to treat hypoglycemia at home.  Also reviewed blood sugar goals and A1c goals for home.   ? ?RNs to provide ongoing basic DM education at bedside with this patient.  Have ordered educational booklet--RD consulted for DM diet education for this patient. ? ?Also reviewed Insulin pen use at home.  Pt told me the MD mentioned he may send her home on insulin (not ready for d/c yet).  Pt told me she has given her friend insulin injections before and has some familiarity with it.  Educated patient on insulin pen use at home.  Reviewed all steps of insulin pen including attachment of needle, 2-unit air shot, dialing up dose, giving injection, rotation of injection sites, removing needle, disposal of sharps, storage of unused insulin,  disposal of insulin etc.  Patient able to provide successful return demonstration.  Reviewed troubleshooting with insulin pen.   ? ?Pt expressed interest in possibly using Freestyle Libre CGM for home.  Has medicaid coverage.  We tried to download the [employer] app to her Android phone but were unable to find the correct app--I looked up the Freestyle compatability chart and pt does not have a smart phone that will work with the app.  We can set pt up with a reader and a sensor prior to d/c if MD allows.  Briefly explained to pt how to apply the Freestyle Libre CGM and how the reader works.  Permission given to this DM RN but Dr. Renae Gloss to place CGM on pt prior to d/c (possibly Monday).   ? ? ? ?--Will follow patient during hospitalization-- ? ?Ambrose Finland RN, MSN, CDE ?Diabetes Coordinator ?Inpatient Glycemic Control Team ?Team Pager: [phone] (8a-5p) ? ? ? ? ? ? ?

## 2022-02-04 NOTE — Plan of Care (Signed)

## 2022-02-04 NOTE — Progress Notes (Signed)
?Progress Note ? ? ?Patient: Christy Mayer [id number] DOB: [birth year] DOA: 01/26/2022     9 ?DOS: the patient was seen and examined on 02/04/2022 ?  ? ?Assessment and Plan: ?* Acute respiratory failure with hypoxia (HCC) ?On 02/01/2022 pulse ox of 86% on 5 L.  Patient was advanced to high flow nasal cannula.  The patient was as high as 15 L high flow nasal cannula.  Yesterday was on 10 L high flow nasal cannula.  This morning back on 10 L high flow nasal cannula.  Slightly better air entry today than the other day when I saw her. ? ?COPD exacerbation (HCC) ?Continue Solu-Medrol 80 mg daily and nebulizer treatments.  Continue incentive spirometry.  The patient still with poor air entry.  Slight improvement with air entry today as compared to the other day. ? ?CAP (community acquired pneumonia) ?Restarted antibiotics with Zosyn and doxycycline starting on 02/02/2022. ? ?Uncontrolled type 2 diabetes mellitus with hyperglycemia, with long-term current use of insulin (HCC) ?With being on steroids sugars are elevated.  Hemoglobin A1c elevated 8.3 which goes along with an average sugar above 180.  Increase Semglee insulin 25 units daily.  Short acting insulin prior to meals plus increase to high-dose sliding scale ? ?Hyponatremia ?Last sodium 133. ? ?HTN (hypertension) ?With blood pressure on the lower side hold lisinopril ? ?Depression with anxiety ? Continue home medications ? ?Obesity (BMI 30-39.9) ?BMI 38.72 ? ? ? ?Migraine headache ?- As needed sumatriptan ?-Continue home Lamictal ? ?Tobacco use disorder ? ? ? ? ? ? ?  ? ?Subjective: Patient still not feeling any better.  Still with difficulty breathing.  Still with some cough. ? ?Physical Exam: ?Vitals:  ? 02/04/22 0815 02/04/22 0833 02/04/22 0905 02/04/22 1415  ?BP:   92/64   ?Pulse:   81   ?Resp:      ?Temp:   99.1 ?F (37.3 ?C)   ?TempSrc:   Oral   ?SpO2: 90% 90% 90% 92%  ?Weight:      ?Height:      ? ?Physical Exam ?HENT:  ?   Head: Normocephalic.  ?    Mouth/Throat:  ?   Pharynx: No oropharyngeal exudate.  ?Eyes:  ?   General: Lids are normal.  ?   Conjunctiva/sclera: Conjunctivae normal.  ?Cardiovascular:  ?   Rate and Rhythm: Normal rate and regular rhythm.  ?   Heart sounds: Normal heart sounds, S1 normal and S2 normal.  ?Pulmonary:  ?   Breath sounds: Examination of the right-middle field reveals decreased breath sounds. Examination of the left-middle field reveals decreased breath sounds. Examination of the right-lower field reveals decreased breath sounds and rhonchi. Examination of the left-lower field reveals decreased breath sounds and rhonchi. Decreased breath sounds and rhonchi present. No wheezing or rales.  ?Abdominal:  ?   Palpations: Abdomen is soft.  ?   Tenderness: There is no abdominal tenderness.  ?Musculoskeletal:  ?   Right lower leg: No swelling.  ?   Left lower leg: No swelling.  ?Skin: ?   General: Skin is warm.  ?   Findings: No rash.  ?Neurological:  ?   Mental Status: She is alert and oriented to person, place, and time.  ?  ?Data Reviewed: ?Pulse ox 92% on 10 L high flow nasal cannula ? ?Disposition: ?Status is: Inpatient ?Remains inpatient appropriate because: Still on 10 L high flow nasal cannula. ? ?Planned Discharge Destination: Home ? ?Author: ?Alford Highland, MD ?02/04/2022 3:18 PM ? ?For  on call review [email or web address].  ?

## 2022-02-05 ENCOUNTER — Inpatient Hospital Stay: Payer: Medicaid Other

## 2022-02-05 LAB — CBC WITH DIFFERENTIAL/PLATELET
Abs Immature Granulocytes: 0.12 K/uL — ABNORMAL HIGH (ref 0.00–0.07)
Basophils Absolute: 0 K/uL (ref 0.0–0.1)
Basophils Relative: 0 %
Eosinophils Absolute: 0.1 K/uL (ref 0.0–0.5)
Eosinophils Relative: 1 %
HCT: 43 % (ref 36.0–46.0)
Hemoglobin: 14.5 g/dL (ref 12.0–15.0)
Immature Granulocytes: 1 %
Lymphocytes Relative: 22 %
Lymphs Abs: 2.3 K/uL (ref 0.7–4.0)
MCH: 31.9 pg (ref 26.0–34.0)
MCHC: 33.7 g/dL (ref 30.0–36.0)
MCV: 94.5 fL (ref 80.0–100.0)
Monocytes Absolute: 0.6 K/uL (ref 0.1–1.0)
Monocytes Relative: 6 %
Neutro Abs: 7.5 K/uL (ref 1.7–7.7)
Neutrophils Relative %: 70 %
Platelets: 181 K/uL (ref 150–400)
RBC: 4.55 MIL/uL (ref 3.87–5.11)
RDW: 11.8 % (ref 11.5–15.5)
WBC: 10.6 K/uL — ABNORMAL HIGH (ref 4.0–10.5)
nRBC: 0 % (ref 0.0–0.2)

## 2022-02-05 LAB — BASIC METABOLIC PANEL WITH GFR
Anion gap: 9 (ref 5–15)
BUN: 21 mg/dL — ABNORMAL HIGH (ref 6–20)
CO2: 32 mmol/L (ref 22–32)
Calcium: 9.2 mg/dL (ref 8.9–10.3)
Chloride: 98 mmol/L (ref 98–111)
Creatinine, Ser: 0.82 mg/dL (ref 0.44–1.00)
GFR, Estimated: >60 mL/min (ref >60)
Glucose, Bld: 176 mg/dL — ABNORMAL HIGH (ref 70–99)
Potassium: 4 mmol/L (ref 3.5–5.1)
Sodium: 139 mmol/L (ref 135–145)

## 2022-02-05 LAB — GLUCOSE, CAPILLARY
Glucose-Capillary: 221 mg/dL — ABNORMAL HIGH (ref 70–99)
Glucose-Capillary: 239 mg/dL — ABNORMAL HIGH (ref 70–99)
Glucose-Capillary: 297 mg/dL — ABNORMAL HIGH (ref 70–99)
Glucose-Capillary: 266 mg/dL — ABNORMAL HIGH (ref 70–99)

## 2022-02-05 LAB — SEDIMENTATION RATE: Sed Rate: 23 mm/h (ref 0–30)

## 2022-02-05 LAB — C-REACTIVE PROTEIN: CRP: 4.6 mg/dL — ABNORMAL HIGH (ref <1.0)

## 2022-02-05 NOTE — Plan of Care (Signed)

## 2022-02-05 NOTE — Progress Notes (Signed)
?Progress Note ? ? ?Patient: Christy Mayer [id number] DOB: [birth year] DOA: 01/26/2022     10 ?DOS: the patient was seen and examined on 02/05/2022 ?  ? ? ?Assessment and Plan: ?* Acute respiratory failure with hypoxia (HCC) ?On 02/01/2022 pulse ox of 86% on 5 L.  Patient was advanced to high flow nasal cannula.  The patient was as high as 15 L high flow nasal cannula.  This morning was on 8 L of high flow nasal cannula with good saturations.  I dialed her down to 6 L.  Continue to titrate down oxygen as much as possible. ? ?COPD exacerbation ([place]) ?Continue Solu-Medrol 80 mg daily and nebulizer treatments.  Continue incentive spirometry.  The patient is starting to feel better.  Continue moving around and ambulating as much as possible. ? ?CAP (community acquired pneumonia) ?Restarted antibiotics with Zosyn and doxycycline starting on 02/02/2022 for 5 days. ? ?Uncontrolled type 2 diabetes mellitus with hyperglycemia, with long-term current use of insulin ([place]) ?With being on steroids sugars are elevated.  Hemoglobin A1c elevated 8.3 which goes along with an average sugar above 180.  Continue Semglee insulin 25 units daily.  Short acting insulin prior to meals plus increase to high-dose sliding scale ? ?Hyponatremia ?Last sodium normal range at 139. ? ?HTN (hypertension) ?With blood pressure on the lower side hold lisinopril ? ?Depression with anxiety ? Continue home medications ? ?Obesity (BMI 30-39.9) ?BMI 38.72 ? ? ? ?Migraine headache ?As needed sumatriptan ?Continue home Lamictal ? ?Tobacco use disorder ? ? ? ? ? ? ?  ? ?Subjective: Patient feeling little bit better.  Still with some cough and shortness of breath but feels a little bit better than yesterday.  Admitted with pneumonia and COPD exacerbation ? ?Physical Exam: ?Vitals:  ? 02/05/22 0233 02/05/22 0358 02/05/22 0744 02/05/22 0848  ?BP:  101/61  (!) 92/55  ?Pulse: 74 72  76  ?Resp: 16 16  16   ?Temp:  98.6 ?F (37 ?C)  98.6 ?F (37 ?C)  ?TempSrc:  Oral   Oral  ?SpO2: 97% 95% 93% 99%  ?Weight:      ?Height:      ? ?Physical Exam ?HENT:  ?   Head: Normocephalic.  ?   Mouth/Throat:  ?   Pharynx: No oropharyngeal exudate.  ?Eyes:  ?   General: Lids are normal.  ?   Conjunctiva/sclera: Conjunctivae normal.  ?Cardiovascular:  ?   Rate and Rhythm: Normal rate and regular rhythm.  ?   Heart sounds: Normal heart sounds, S1 normal and S2 normal.  ?Pulmonary:  ?   Breath sounds: Examination of the right-middle field reveals decreased breath sounds. Examination of the left-middle field reveals decreased breath sounds. Examination of the right-lower field reveals decreased breath sounds. Examination of the left-lower field reveals decreased breath sounds. Decreased breath sounds present. No wheezing, rhonchi or rales.  ?Abdominal:  ?   Palpations: Abdomen is soft.  ?   Tenderness: There is no abdominal tenderness.  ?Musculoskeletal:  ?   Right lower leg: No swelling.  ?   Left lower leg: No swelling.  ?Skin: ?   General: Skin is warm.  ?   Findings: No rash.  ?Neurological:  ?   Mental Status: She is alert and oriented to person, place, and time.  ?  ?Data Reviewed: ?Repeat chest x-ray still shows bilateral pneumonia, white blood cell count 10.6, sodium 139, BUN 21 and creatinine 0.82 ? ?Disposition: ?Status is: Inpatient ?Remains inpatient appropriate because: Still  on high flow nasal cannula.  I dialed down to 6 L this morning.  Hopefully can dial down further this afternoon ? ?Planned Discharge Destination: Home ? ? ?Author: ?Loletha Grayer, MD ?02/05/2022 1:38 PM ? ?For on call review [email or web address].  ?

## 2022-02-06 DIAGNOSIS — G43009 Migraine without aura, not intractable, without status migrainosus: Secondary | ICD-10-CM

## 2022-02-06 LAB — GLUCOSE, CAPILLARY
Glucose-Capillary: 214 mg/dL — ABNORMAL HIGH (ref 70–99)
Glucose-Capillary: 334 mg/dL — ABNORMAL HIGH (ref 70–99)
Glucose-Capillary: 305 mg/dL — ABNORMAL HIGH (ref 70–99)
Glucose-Capillary: 244 mg/dL — ABNORMAL HIGH (ref 70–99)

## 2022-02-06 MED ORDER — INSULIN GLARGINE-YFGN 100 UNIT/ML ~~LOC~~ SOLN
20.0000 [IU] | Freq: Every day | SUBCUTANEOUS | Status: DC
  Administered 2022-02-07 – 2022-02-08 (×2): 20 [IU] via SUBCUTANEOUS
  Filled 2022-02-06 (×2): qty 0.2

## 2022-02-06 MED ORDER — METHYLPREDNISOLONE SODIUM SUCC 40 MG IJ SOLR
40.0000 mg | INTRAMUSCULAR | Status: DC
  Administered 2022-02-07 – 2022-02-08 (×2): 40 mg via INTRAVENOUS
  Filled 2022-02-06 (×2): qty 1

## 2022-02-06 NOTE — Progress Notes (Signed)
?Progress Note ? ? ?Patient: Christy Mayer [id number]:[id number] DOB: [birth year] DOA: 01/26/2022     11 ?DOS: the patient was seen and examined on 02/06/2022 ?  ? ? ?Assessment and Plan: ?* Acute respiratory failure with hypoxia (HCC) ?On 02/01/2022 pulse ox of 86% on 5 L.  Patient was advanced to high flow nasal cannula.  The patient was as high as 15 L high flow nasal cannula.  This morning was on 8 L of high flow nasal cannula with good saturations.  I dialed her down to 6 L.  Continue to titrate down oxygen as much as possible. ? ?COPD exacerbation (HCC) ?Continue Solu-Medrol 80 mg daily and nebulizer treatments.  Continue incentive spirometry.  The patient is starting to feel better.  Continue moving around and ambulating as much as possible. ? ?CAP (community acquired pneumonia) ?Restarted antibiotics with Zosyn and doxycycline starting on 02/02/2022 for 5 days. ? ?Uncontrolled type 2 diabetes mellitus with hyperglycemia, with long-term current use of insulin (HCC) ?With being on steroids sugars are elevated.  Hemoglobin A1c elevated 8.3 which goes along with an average sugar above 180.  Continue Semglee insulin 25 units daily.  Short acting insulin prior to meals plus increase to high-dose sliding scale ? ?Hyponatremia ?Last sodium normal range at 139. ? ?HTN (hypertension) ?With blood pressure on the lower side hold lisinopril ? ?Depression with anxiety ? Continue home medications ? ?Obesity (BMI 30-39.9) ?BMI 38.72 ? ? ? ?Migraine headache ?As needed sumatriptan ?Continue home Lamictal ? ?Tobacco use disorder ? ? ? ? ? ? ?  ? ?Subjective: Patient this morning feeling a little short of breath or cough.  She is moving better air today than she has been.  Switched from high flow nasal cannula to regular nasal cannula. ? ?Physical Exam: ?Vitals:  ? 02/06/22 0744 02/06/22 0754 02/06/22 0830 02/06/22 1338  ?BP: 109/63     ?Pulse: 66     ?Resp: 18     ?Temp: 97.9 ?F (36.6 ?C)     ?TempSrc:      ?SpO2: 96% 95% 92% 91%   ?Weight:      ?Height:      ? ?Physical Exam ?HENT:  ?   Head: Normocephalic.  ?   Mouth/Throat:  ?   Pharynx: No oropharyngeal exudate.  ?Eyes:  ?   General: Lids are normal.  ?   Conjunctiva/sclera: Conjunctivae normal.  ?Cardiovascular:  ?   Rate and Rhythm: Normal rate and regular rhythm.  ?   Heart sounds: Normal heart sounds, S1 normal and S2 normal.  ?Pulmonary:  ?   Breath sounds: Examination of the right-middle field reveals decreased breath sounds. Examination of the left-middle field reveals decreased breath sounds. Examination of the right-lower field reveals decreased breath sounds. Examination of the left-lower field reveals decreased breath sounds. Decreased breath sounds present. No wheezing, rhonchi or rales.  ?Abdominal:  ?   Palpations: Abdomen is soft.  ?   Tenderness: There is no abdominal tenderness.  ?Musculoskeletal:  ?   Right lower leg: No swelling.  ?   Left lower leg: No swelling.  ?Skin: ?   General: Skin is warm.  ?   Findings: No rash.  ?Neurological:  ?   Mental Status: She is alert and oriented to person, place, and time.  ?  ?Data Reviewed: ?Labs for sugars 297, 266, 214 and 334 ? ? ? ?Disposition: ?Status is: Inpatient ?Remains inpatient appropriate because: Patient on 5 L nasal cannula just switched out from  high flow nasal cannula ? ?Planned Discharge Destination: Home ? ?Author: ?Alford Highland, MD ?02/06/2022 2:32 PM ? ?For on call review [email or web address].  ?

## 2022-02-06 NOTE — Plan of Care (Signed)
Patient AAOx4, no pain. SOB with exertion. Currently on 5L O2 nasal canula. Plan to wean oxygen. Bed is in lowest position, call light within reach. Will continue to monitor. ?

## 2022-02-07 LAB — GLUCOSE, CAPILLARY
Glucose-Capillary: 164 mg/dL — ABNORMAL HIGH (ref 70–99)
Glucose-Capillary: 334 mg/dL — ABNORMAL HIGH (ref 70–99)
Glucose-Capillary: 303 mg/dL — ABNORMAL HIGH (ref 70–99)
Glucose-Capillary: 262 mg/dL — ABNORMAL HIGH (ref 70–99)

## 2022-02-07 LAB — ANCA PROFILE
Anti-MPO Antibodies: <0.2 units (ref 0.0–0.9)
Anti-PR3 Antibodies: <0.2 units (ref 0.0–0.9)
Atypical P-ANCA titer: <1:20 {titer}
C-ANCA: <1:20 {titer}
P-ANCA: <1:20 {titer}

## 2022-02-07 LAB — LEGIONELLA PNEUMOPHILA SEROGP 1 UR AG: L. pneumophila Serogp 1 Ur Ag: NEGATIVE

## 2022-02-07 LAB — ANA W/REFLEX IF POSITIVE: Anti Nuclear Antibody (ANA): NEGATIVE

## 2022-02-07 MED ORDER — SUMATRIPTAN SUCCINATE 50 MG PO TABS
50.0000 mg | ORAL_TABLET | Freq: Once | ORAL | Status: AC
  Administered 2022-02-07: 50 mg via ORAL

## 2022-02-07 MED ORDER — MAGNESIUM SULFATE 2 GM/50ML IV SOLN
2.0000 g | Freq: Once | INTRAVENOUS | Status: AC
  Administered 2022-02-07: 2 g via INTRAVENOUS
  Filled 2022-02-07: qty 50

## 2022-02-07 MED ORDER — SUMATRIPTAN SUCCINATE 50 MG PO TABS: 50.0000 mg | ORAL_TABLET | Freq: Every day | ORAL | Status: DC | PRN

## 2022-02-07 NOTE — Progress Notes (Signed)
?Progress Note ? ? ?Patient: Christy Mayer [id number] DOB: [birth year] DOA: 01/26/2022     12 ?DOS: the patient was seen and examined on 02/07/2022 ? ? ?Assessment and Plan: ?* Acute respiratory failure with hypoxia (HCC) ?On 02/01/2022 pulse ox of 86% on 5 L.  Patient was advanced to high flow nasal cannula.  The patient was as high as 15 L high flow nasal cannula.  This morning was on 5 L nasal cannula with good saturations and down down to 3 L.  I asked nursing staff to check a pulse ox on room air and with ambulation. ? ?COPD exacerbation ([employer]) ?Continue Solu-Medrol 40 mg daily today.  Continue nebulizer treatments.  Continue incentive spirometry.  The patient is starting to move better air.  Continue moving around and ambulating as much as possible. ? ?CAP (community acquired pneumonia) ?Restarted antibiotics with Zosyn and doxycycline starting on 02/02/2022 for 5 days (today will be the last day). ? ?Uncontrolled type 2 diabetes mellitus with hyperglycemia, with long-term current use of insulin ([place]) ?With being on steroids sugars are elevated.  Hemoglobin A1c elevated 8.3 which goes along with an average sugar above 180.  Continue Semglee insulin 20 units daily today. Short acting insulin prior to meals plus increase to high-dose sliding scale ? ?Hyponatremia ?Last sodium normal range at 139. ? ?HTN (hypertension) ?With blood pressure on the lower side hold lisinopril ? ?Depression with anxiety ? Continue home medications ? ?Obesity (BMI 30-39.9) ?BMI 38.72 ? ? ? ?Migraine headache ?As needed sumatriptan ?Continue home Lamictal ? ?Tobacco use disorder ? ? ? ? ? ? ?  ? ?Subjective: Patient still has some shortness of breath on a rough night with some coughing.  Was dialed down to 3 L today.  Patient has some headache today.  Admitted with COPD exacerbation and pneumonia ? ?Physical Exam: ?Vitals:  ? 02/07/22 0401 02/07/22 0745 02/07/22 0745 02/07/22 1348  ?BP: 107/68  101/67   ?Pulse: 73  74   ?Resp: 18  18    ?Temp: 98.6 ?F (37 ?C) 98.2 ?F (36.8 ?C)    ?TempSrc: Oral Oral    ?SpO2: 94%  94% 92%  ?Weight:      ?Height:      ? ?Physical Exam ?HENT:  ?   Head: Normocephalic.  ?   Mouth/Throat:  ?   Pharynx: No oropharyngeal exudate.  ?Eyes:  ?   General: Lids are normal.  ?   Conjunctiva/sclera: Conjunctivae normal.  ?Cardiovascular:  ?   Rate and Rhythm: Normal rate and regular rhythm.  ?   Heart sounds: Normal heart sounds, S1 normal and S2 normal.  ?Pulmonary:  ?   Breath sounds: Examination of the right-middle field reveals decreased breath sounds. Examination of the left-middle field reveals decreased breath sounds. Examination of the right-lower field reveals decreased breath sounds. Examination of the left-lower field reveals decreased breath sounds. Decreased breath sounds present. No wheezing, rhonchi or rales.  ?Abdominal:  ?   Palpations: Abdomen is soft.  ?   Tenderness: There is no abdominal tenderness.  ?Musculoskeletal:  ?   Right lower leg: No swelling.  ?   Left lower leg: No swelling.  ?Skin: ?   General: Skin is warm.  ?   Findings: No rash.  ?Neurological:  ?   Mental Status: She is alert and oriented to person, place, and time.  ?  ?Data Reviewed: ?Last for sugars 305, 244, 164 and 334 ? ?Disposition: ?Status is: Inpatient ?Remains inpatient appropriate  because: Just dialed down to 3 L today make sure she is stable.  We will try to get qualifying status for home oxygen or see if we can get off the oxygen. ? ?Planned Discharge Destination: Home ? ?Author: ?Loletha Grayer, MD ?02/07/2022 2:24 PM ? ?For on call review [email or web address].  ?

## 2022-02-08 LAB — GLUCOSE, CAPILLARY
Glucose-Capillary: 209 mg/dL — ABNORMAL HIGH (ref 70–99)
Glucose-Capillary: 329 mg/dL — ABNORMAL HIGH (ref 70–99)

## 2022-02-08 MED ORDER — BLOOD GLUCOSE MONITOR KIT: PACK | 0 refills | Status: DC

## 2022-02-08 MED ORDER — ALCOHOL SWABS PADS: 1.0000 | MEDICATED_PAD | Freq: Three times a day (TID) | 0 refills | Status: AC

## 2022-02-08 MED ORDER — ALBUTEROL SULFATE HFA 108 (90 BASE) MCG/ACT IN AERS: 2.0000 | INHALATION_SPRAY | Freq: Four times a day (QID) | RESPIRATORY_TRACT | 0 refills | Status: DC | PRN

## 2022-02-08 MED ORDER — BUDESONIDE-FORMOTEROL FUMARATE 160-4.5 MCG/ACT IN AERO: 2.0000 | INHALATION_SPRAY | Freq: Every day | RESPIRATORY_TRACT | 0 refills | Status: DC

## 2022-02-08 MED ORDER — NICOTINE 14 MG/24HR TD PT24: MEDICATED_PATCH | TRANSDERMAL | 0 refills | Status: DC

## 2022-02-08 MED ORDER — INSULIN DETEMIR 100 UNIT/ML FLEXPEN: PEN_INJECTOR | SUBCUTANEOUS | 0 refills | Status: DC

## 2022-02-08 MED ORDER — LORATADINE 10 MG PO TABS: 10.0000 mg | ORAL_TABLET | Freq: Every day | ORAL | 0 refills | Status: DC

## 2022-02-08 MED ORDER — POLYETHYLENE GLYCOL 3350 17 G PO PACK
17.0000 g | PACK | Freq: Every day | ORAL | 0 refills | Status: DC | PRN
Start: 2022-02-08 — End: 2023-09-27

## 2022-02-08 MED ORDER — INSULIN ASPART (W/NIACINAMIDE) 100 UNIT/ML ~~LOC~~ SOPN: PEN_INJECTOR | SUBCUTANEOUS | 0 refills | Status: DC

## 2022-02-08 MED ORDER — BUTALBITAL-APAP-CAFFEINE 50-325-40 MG PO TABS
1.0000 | ORAL_TABLET | Freq: Once | ORAL | Status: AC
  Administered 2022-02-08: 1 via ORAL
  Filled 2022-02-08: qty 1

## 2022-02-08 MED ORDER — PREDNISONE 10 MG PO TABS: ORAL_TABLET | ORAL | 0 refills | Status: DC

## 2022-02-08 MED ORDER — PREGABALIN 75 MG PO CAPS: 75.0000 mg | ORAL_CAPSULE | Freq: Two times a day (BID) | ORAL | 0 refills | Status: DC

## 2022-02-08 MED ORDER — INSULIN PEN NEEDLE 33G X 5 MM MISC: 1.0000 | Freq: Three times a day (TID) | 0 refills | Status: DC

## 2022-02-08 NOTE — Plan of Care (Signed)
Pt AAOx4, moderate intermittent headaches, VS stable. Pt on RA. Walking O2 performed this morning and she maintained above 90%. AVS and education provided. IV removed with no complications. Pt transported to discharge lounge where family will transport her home.  ?

## 2022-02-08 NOTE — TOC Transition Note (Signed)
Transition of Care (TOC) - CM/SW Discharge Note ? ? ?Patient Details  ?Name: Christy Mayer ?MRN: [id number] ?Date of Birth: 06-13-1966 ? ?Transition of Care (TOC) CM/SW Contact:  ?Beverly Sessions, RN ?Phone Number: ?02/08/2022, 11:49 AM ? ? ?Clinical Narrative:    ? ? ? ? Notified by MD that patient was weaned to RA with exertion and will not require O2 at discharge. MD to order glucometer at discharge, and patient to obtain at hr pharmacy using her Medicaid  ?  ? ? ?Patient Goals and CMS Choice ?  ?  ?  ? ?Discharge Placement ?  ?           ?  ?  ?  ?  ? ?Discharge Plan and Services ?  ?  ?           ?  ?  ?  ?  ?  ?  ?  ?  ?  ?  ? ?Social Determinants of Health (SDOH) Interventions ?  ? ? ?Readmission Risk Interventions ?   ? View : No data to display.  ?  ?  ?  ? ? ? ? ? ?

## 2022-02-08 NOTE — Discharge Instructions (Signed)
If the lidoderm patch helped you while in the hospital then you can use salonpas 4% over the counter ?

## 2022-02-08 NOTE — Progress Notes (Signed)
Mobility Specialist - Progress Note ? ? 02/08/22 1100  ?Mobility  ?Activity Ambulated independently in hallway  ?Level of Assistance Independent  ?Assistive Device None  ?Distance Ambulated (ft) 320 ft  ?Activity Response Tolerated well  ?$Mobility charge 1 Mobility  ? ? ? ?Pre-mobility: 83 HR, 91% SpO2 ?During mobility: 97 HR, 90-93% SpO2 ?Post-mobility: 90 HR, 93% SpO2 ? ? ?Pt lying in bed upon arrival, utilizing RA. Mild dizziness upon standing. Pt ambulated 2 laps around nurses station independently. Mild SOB but sats maintaining >/= 90% with good pleth. Pt left in bed with needs in reach. RN notified. ? ? ?Christy Mayer ?Mobility Specialist ?02/08/22, 11:41 AM ? ?

## 2022-02-08 NOTE — Progress Notes (Addendum)
Inpatient Diabetes Program Recommendations ? ?AACE/ADA: New Consensus Statement on Inpatient Glycemic Control (2015) ? ?Target Ranges:  Prepandial:   less than 140 mg/dL ?     [facility] postprandial:   less than 180 mg/dL (1-2 hours) ?     Critically ill patients:  140 - 180 mg/dL  ? ? Latest Reference Range & Units 02/07/22 07:42 02/07/22 12:25 02/07/22 16:39 02/07/22 20:05 02/08/22 08:26  ?Glucose-Capillary 70 - 99 mg/dL 858 (H) ? ?Novolog 9 units ? ?Semglee 20 units ? ?Solumedrol 40 mg 334 (H) ? ?Novolog 20 units 303 (H) ? ?Novolog 20 units 262 (H) ? ?Novolog 3 units 209 (H) ? ?Novolog 12 units  ? ?Admit with ?Acute respiratory failure with hypoxia  ?COPD exacerbation ?CAP (community acquired pneumonia) ?New Diagnosis Diabetes ? ? ?Current Orders: Semglee 20 units Daily ?     Novolog Resistant Correction Scale/ SSI (0-20 units) TID AC + HS ?     Novolog 5 units TID with meals ? ? ?Postprandials increase after meal intake and Solumedrol 40 mg Daily dose ? ?-   Consider increasing Novolog meal coverage to 10 units tid if eating >50% of meals. ? ?Pt expressed interest in possibly using Freestyle Libre CGM for home.  Diabetes Coordinator to place sensor on pt prior to d/c (possibly Wednesday).   ? ?--Will follow patient during hospitalization-- ? ?Christena Deem RN, MSN, BC-ADM ?Inpatient Diabetes Coordinator ?Team Pager [phone] (8a-5p) ?

## 2022-02-08 NOTE — Discharge Summary (Signed)
?Physician Discharge Summary ?  ?Patient: Christy Mayer MRN: [id number] DOB: [birth year]  ?Admit date:     01/26/2022  ?Discharge date: 02/08/22  ?Discharge Physician: Loletha Grayer  ? ?PCP: Olin Hauser, DO  ? ?Recommendations at discharge:  ? ?Follow-up PCP 5 days ? ?Discharge Diagnoses: ?Principal Problem: ?  Acute respiratory failure with hypoxia ([place]) ?Active Problems: ?  COPD exacerbation ([place]) ?  CAP (community acquired pneumonia) ?  Uncontrolled type 2 diabetes mellitus with hyperglycemia, with long-term current use of insulin ([place]) ?  Tobacco use disorder ?  Migraine headache ?  Obesity (BMI 30-39.9) ?  Depression with anxiety ?  HTN (hypertension) ?  Hyponatremia ? ? ?Hospital Course: ?Patient was admitted on 01/26/2022 with shortness of breath and pneumonia, COPD exacerbation.  Was given a quick course of Rocephin and Zithromax.  The patient was switched over to prednisone and then condition worsened where she required to go on high flow nasal cannula up to 15 L.  She was placed back on IV Solu-Medrol.  I gave another course of antibiotic with Zosyn and doxycycline.  The patient was finally able to come down off of oxygen completely and hold her sats with ambulation on 02/08/2022.  The patient was discharged home on a prednisone taper.  The patient completed a second course of antibiotics here in the hospital. ? ?Assessment and Plan: ?* Acute respiratory failure with hypoxia ([place]) ?On 02/01/2022 pulse ox of 86% on 5 L.  Patient was advanced to high flow nasal cannula.  The patient was as high as 15 L high flow nasal cannula.  This morning was on room air.  She was able to hold her saturations with ambulation.  The patient was discharged home without oxygen. ? ?COPD exacerbation ([place]) ?Continue Solu-Medrol 40 mg today.  The patient will get a long prednisone taper since the patient has been on steroids the entire hospital course.  Refilled her Symbicort and albuterol.  Advised no smoking. ? ?CAP  (community acquired pneumonia) ?Completed 5 days of second course of antibiotic with Zosyn and doxycycline ? ?Uncontrolled type 2 diabetes mellitus with hyperglycemia, with long-term current use of insulin ([place]) ?I had our pharmacist confirmed with her pharmacy on which insulins they will be able to give.  Our pharmacist changed the long-acting insulin to Semglee insulin 20 units daily and NovoLog insulin prior to meals. ? ?Hyponatremia ?Last sodium normal range at 139. ? ?HTN (hypertension) ?With blood pressure on the lower side hold lisinopril ? ?Depression with anxiety ?Continue home medications ? ?Obesity (BMI 30-39.9) ?BMI 38.72 ? ?Migraine headache ?As needed sumatriptan ?Continue home Lamictal ? ?Tobacco use disorder ?Advised not to go back to smoking ? ? ? ? ? ?  ? ? ?Consultants: None ?Procedures performed: None ?Disposition: Home ?Diet recommendation:  ?Cardiac and Carb modified diet ?DISCHARGE MEDICATION: ?Allergies as of 02/08/2022   ? ?   Reactions  ? Aspirin Nausea Only  ? ?  ? ?  ?Medication List  ?  ? ?STOP taking these medications   ? ?amoxicillin-clavulanate 875-125 MG tablet ?Commonly known as: AUGMENTIN ?  ?cloNIDine HCl 0.1 MG Tb12 ER tablet ?Commonly known as: KAPVAY ?  ?lisinopril 5 MG tablet ?Commonly known as: ZESTRIL ?  ? ?  ? ?TAKE these medications   ? ?albuterol 108 (90 Base) MCG/ACT inhaler ?Commonly known as: VENTOLIN HFA ?Inhale 2 puffs into the lungs every 6 (six) hours as needed for wheezing or shortness of breath. ?What changed: See the new instructions. ?  ?  Alcohol Swabs Pads ?1 Dose by Does not apply route 3 (three) times daily before meals. ?  ?blood glucose meter kit and supplies Kit ?Dispense based on patient and insurance preference. Use up to four times daily as directed. Check sugars prior to meals and any time you feel funny. ?  ?budesonide-formoterol 160-4.5 MCG/ACT inhaler ?Commonly known as: Symbicort ?Inhale 2 puffs into the lungs daily. Need future visit for refills ?   ?buPROPion 150 MG 24 hr tablet ?Commonly known as: WELLBUTRIN XL ?Take 1 tablet by mouth daily. ?  ?DULoxetine 60 MG capsule ?Commonly known as: CYMBALTA ?Take 2 capsules by mouth daily. ?  ?fluticasone 50 MCG/ACT nasal spray ?Commonly known as: FLONASE ?Place 2 sprays into both nostrils daily. Use for 4-6 weeks then stop and use seasonally or as needed. ?  ?insulin aspart 100 UNIT/ML FlexTouch Pen ?Commonly known as: FIASP ?6 units prior to meals (okay to switch to any short acting insulin that is covered) ?  ?insulin detemir 100 UNIT/ML FlexPen ?Commonly known as: LEVEMIR ?20 units subcutaneous injection (okay to switch to any long acting insulin that is covered) ?  ?Insulin Pen Needle 33G X 5 MM Misc ?1 Dose by Does not apply route 3 (three) times daily before meals. ?  ?lamoTRIgine 25 MG tablet ?Commonly known as: LAMICTAL ?Take 25 mg by mouth daily. ?  ?loratadine 10 MG tablet ?Commonly known as: CLARITIN ?Take 1 tablet (10 mg total) by mouth daily. ?Start taking on: Feb 09, 2022 ?  ?nicotine 14 mg/24hr patch ?Commonly known as: NICODERM CQ - dosed in mg/24 hours ?One patch chest wall daily (substitute generic) ?  ?ondansetron 4 MG disintegrating tablet ?Commonly known as: Zofran ODT ?Take 1 tablet (4 mg total) by mouth every 8 (eight) hours as needed for nausea or vomiting. ?  ?polyethylene glycol 17 g packet ?Commonly known as: MIRALAX / GLYCOLAX ?Take 17 g by mouth daily as needed for moderate constipation. ?  ?predniSONE 10 MG tablet ?Commonly known as: DELTASONE ?4 tabs po day1; 3 tabs po day2,3; 2 tabs po day4,5; 1 tab po day6,7; 1/2 tab po day 8,9,10,11 ?What changed:  ?medication strength ?additional instructions ?  ?pregabalin 75 MG capsule ?Commonly known as: Lyrica ?Take 1 capsule (75 mg total) by mouth 2 (two) times daily. ?  ?SUMAtriptan 50 MG tablet ?Commonly known as: IMITREX ?Take 1 tablet (50 mg total) by mouth once as needed for up to 1 dose for migraine. May repeat one dose in 2 hours if  headache persists, for max dose 24 hours ?  ?traZODone 50 MG tablet ?Commonly known as: DESYREL ?Take 300 mg by mouth at bedtime. ?  ? ?  ? ? Follow-up Information   ? ? Olin Hauser, DO Follow up in 5 day(s).   ?Specialty: Family Medicine ?Contact information: ?[address] [place] [address] ?[phone] ? ? ?  ?  ? ?  ?  ? ?  ? ?Discharge Exam: ?Filed Weights  ? 01/26/22 1322 02/03/22 1507  ?Weight: 102.1 kg 112.1 kg  ? ?Physical Exam ?HENT:  ?   Head: Normocephalic.  ?   Mouth/Throat:  ?   Pharynx: No oropharyngeal exudate.  ?Eyes:  ?   General: Lids are normal.  ?   Conjunctiva/sclera: Conjunctivae normal.  ?Cardiovascular:  ?   Rate and Rhythm: Normal rate and regular rhythm.  ?   Heart sounds: Normal heart sounds, S1 normal and S2 normal.  ?Pulmonary:  ?   Breath sounds:  Examination of the right-lower field reveals decreased breath sounds. Examination of the left-lower field reveals decreased breath sounds. Decreased breath sounds present. No wheezing, rhonchi or rales.  ?Abdominal:  ?   Palpations: Abdomen is soft.  ?   Tenderness: There is no abdominal tenderness.  ?Musculoskeletal:  ?   Right lower leg: No swelling.  ?   Left lower leg: No swelling.  ?Skin: ?   General: Skin is warm.  ?   Findings: No rash.  ?Neurological:  ?   Mental Status: She is alert and oriented to person, place, and time.  ?  ? ?Condition at discharge: stable ? ?The results of significant diagnostics from this hospitalization (including imaging, microbiology, ancillary and laboratory) are listed below for reference.  ? ?Imaging Studies: ?DG Chest 2 View ? ?Result Date: 02/05/2022 ?CLINICAL DATA:  Shortness of breath.  Hypoxia.  History of COPD. EXAM: CHEST - 2 VIEW COMPARISON:  Radiograph 02/01/2022, CT 01/29/2022 FINDINGS: Stable lung volumes. Small bilateral pleural effusions and associated airspace disease, without significant interval change. No new consolidation. Background interstitial coarsening is stable.  Unchanged heart size and mediastinal contours. No pneumothorax. IMPRESSION: No significant change from prior radiograph. Small bilateral pleural effusions and associated basilar airspace disease. Electronically S

## 2022-02-09 ENCOUNTER — Telehealth: Payer: Self-pay

## 2022-02-09 NOTE — Telephone Encounter (Signed)
LVM to return call.

## 2022-02-09 NOTE — Telephone Encounter (Signed)
Transition Care Management Unsuccessful Follow-up Telephone Call ? ?Date of discharge and from where:  02/08/2022 from [facility] ? ?Attempts:  1st Attempt ? ?Reason for unsuccessful TCM follow-up call:  Left voice message ? ? ? ?

## 2022-02-10 ENCOUNTER — Other Ambulatory Visit: Payer: Self-pay | Admitting: Family Medicine

## 2022-02-10 ENCOUNTER — Telehealth: Payer: Self-pay

## 2022-02-10 DIAGNOSIS — E1165 Type 2 diabetes mellitus with hyperglycemia: Secondary | ICD-10-CM

## 2022-02-10 MED ORDER — ACCU-CHEK GUIDE VI STRP: ORAL_STRIP | 3 refills | Status: DC

## 2022-02-10 MED ORDER — ACCU-CHEK GUIDE W/DEVICE KIT: PACK | 0 refills | Status: DC

## 2022-02-10 MED ORDER — ACCU-CHEK SOFTCLIX LANCETS MISC: 3 refills | Status: DC

## 2022-02-10 MED ORDER — ACCU-CHEK GUIDE CONTROL VI LIQD: 0 refills | Status: AC

## 2022-02-10 NOTE — Telephone Encounter (Signed)
Ordered Accu-chek Guide glucometer and supplies ? ?Saralyn Pilar, DO ?[facility] ?[facility] ?02/10/2022, 2:52 PM ? ?

## 2022-02-10 NOTE — Telephone Encounter (Signed)
Transition Care Management Unsuccessful Follow-up Telephone Call ? ?Date of discharge and from where:  02/08/2022-[facility] ? ?Attempts:  2nd Attempt ? ?Reason for unsuccessful TCM follow-up call:  Left voice message ? ?  ?

## 2022-02-10 NOTE — Telephone Encounter (Signed)
PT was prescribed a glucometer machine for her newly diagnosed DM2. It is not covered under Medicaid- can you call one in that IS covered by Medicaid if you know of one? [facility] on [address] In [place]. Thanks! ?

## 2022-02-10 NOTE — Addendum Note (Signed)
Addended by: Smitty Cords on: 02/10/2022 02:52 PM ? ? Modules accepted: Orders ? ?

## 2022-02-11 NOTE — Telephone Encounter (Signed)
Transition Care Management Unsuccessful Follow-up Telephone Call ? ?Date of discharge and from where:  02/08/2022-[facility] ? ?Attempts:  3rd Attempt ? ?Reason for unsuccessful TCM follow-up call:  Left voice message ? ?  ?

## 2022-02-15 ENCOUNTER — Ambulatory Visit: Payer: Medicaid Other | Admitting: Family Medicine

## 2022-02-15 ENCOUNTER — Encounter: Payer: Self-pay | Admitting: Family Medicine

## 2022-02-15 VITALS — BP 138/92 | HR 94 | Ht 67.0 in | Wt 247.6 lb

## 2022-02-15 DIAGNOSIS — J432 Centrilobular emphysema: Secondary | ICD-10-CM | POA: Diagnosis not present

## 2022-02-15 DIAGNOSIS — E1165 Type 2 diabetes mellitus with hyperglycemia: Secondary | ICD-10-CM

## 2022-02-15 DIAGNOSIS — J441 Chronic obstructive pulmonary disease with (acute) exacerbation: Secondary | ICD-10-CM

## 2022-02-15 DIAGNOSIS — F339 Major depressive disorder, recurrent, unspecified: Secondary | ICD-10-CM

## 2022-02-15 DIAGNOSIS — J189 Pneumonia, unspecified organism: Secondary | ICD-10-CM | POA: Diagnosis not present

## 2022-02-15 DIAGNOSIS — Z794 Long term (current) use of insulin: Secondary | ICD-10-CM

## 2022-02-15 DIAGNOSIS — F411 Generalized anxiety disorder: Secondary | ICD-10-CM

## 2022-02-15 MED ORDER — BUPROPION HCL ER (XL) 150 MG PO TB24: 150.0000 mg | ORAL_TABLET | Freq: Every day | ORAL | 1 refills | Status: DC

## 2022-02-15 MED ORDER — ALPRAZOLAM 0.25 MG PO TABS: 0.2500 mg | ORAL_TABLET | Freq: Two times a day (BID) | ORAL | 2 refills | Status: DC

## 2022-02-15 MED ORDER — METFORMIN HCL 500 MG PO TABS: 1000.0000 mg | ORAL_TABLET | Freq: Two times a day (BID) | ORAL | 1 refills | Status: DC

## 2022-02-15 NOTE — Patient Instructions (Addendum)
Thank you for coming to the office today. ? ?1st week start with adding Metformin 500mg  daily with meal, pick dinner or breakfast. Then each week add 1 extra dose. Final dose is 2 pills twice a day (breakfast, dinner), caution with some GI side effects loose stool etc, can space out the dosing slower if need. ? ?Continue Insulin Levemir 20u daily for now. ? ?Mealtime insulin look for sugar before meal of >170 than use your mealtime insulin, can do anywhere from 4 to 6 units. ? ?Eventually goal to skip mealtime, and eventually dial back levemir. ? ?Future we can add other medications that are covered. ? ?Ordered Wellbutrin and Xanax today. Please check back in with [facility] in the future. ? ? ?Please schedule a Follow-up Appointment to: Return in about 3 months (around 05/18/2022) for 3 month DM A1c, COPD f/u mood/anxiety psych. ? ?If you have any other questions or concerns, please feel free to call the office or send a message through [place]. You may also schedule an earlier appointment if necessary. ? ?Additionally, you may be receiving a survey about your experience at our office within a few days to 1 week by e-mail or mail. We value your feedback. ? ?Nobie Putnam, DO ?[place] ?

## 2022-02-15 NOTE — Progress Notes (Signed)
? ?Subjective:  ? ? Patient ID: Christy Mayer, female    DOB: 07/18/1966, 56 y.o.   MRN: [id number] ? ?Christy Mayer is a 56 y.o. female presenting on 02/15/2022 for Hospitalization Follow-up ? ? ?HPI ? ?HOSPITAL FOLLOW-UP VISIT ? ?Hospital/Location: [facility] ?Date of Admission: 01/26/22 ?Date of Discharge: 02/08/22 ?Transitions of care telephone call: Unable to reach patient 3 attempts, last call [place], date of call was 02/09/22 ? ?Reason for Admission: Acute Respiratory Failure COPD Exacerbation ? ?- Hospital H&P and Discharge Summary have been reviewed ?- Patient presents today 7 days after recent hospitalization. Brief summary of recent course, patient had symptoms of dyspnea, poor appetite, not eating, not sleeping went to hospital, hospitalized with hypoxia respiratory failure, found to have pneumonia on imaging, placed on high flow O2 15L and COPD steroid treatment. ? ?Prior to discharge home, she was able to improve with 90% O2 Sat after weaned down. [facility] oxygen treatment up to 15L, discharged to home without oxygen. She was having difficulty clearing congestion out of chest. ? ?She describes difficulty with throat narrowing or shortness of breath with activity but improves w/ inhaler. ? ?Hyperglycemia, new diagnosis since discharge with CBG up to >500 at [facility], A1c 8.3, no prior history was 5.6 A1c % in past. Discharged on Insulin regimen. ? ?AM before medications she had CBG 229, and seems to avg CBG 200-240 range ? ?She has about 5-7 days left on prednisone taper. ? ?Still trying to quit smoking, now she is working on quitting and avoiding behavioral patterns. Using NRT patches and gum. And she is motivated to quit. ? ?- Today reports overall has done well after discharge. Symptoms of breathing has improved. ? ?Admits the anxiety felt much better on the Wellbutrin and low dose Xanax 0.30m twice a day she felt calm ?- She is followed by [facility], admits previous meds were making  her feel dull ?- She admits insomnia, currently taking ? ?Trazodone 1050mx 3 = 30039mightly ? ?- New medications on discharge: Levemir pen 20 units daily AM, insulin aspart mealtime 6 units TID WC if CBG >200 ? ?I have reviewed the discharge medication list, and have reconciled the current and discharge medications today. ? ? ?Current Outpatient Medications:  ?  ACCU-CHEK GUIDE test strip, Use to check blood sugar up to 2 times daily, Disp: 200 each, Rfl: 3 ?  Accu-Chek Softclix Lancets lancets, Check sugar up to 2 times daily, Disp: 200 each, Rfl: 3 ?  albuterol (VENTOLIN HFA) 108 (90 Base) MCG/ACT inhaler, Inhale 2 puffs into the lungs every 6 (six) hours as needed for wheezing or shortness of breath., Disp: 18 g, Rfl: 0 ?  Alcohol Swabs PADS, 1 Dose by Does not apply route 3 (three) times daily before meals., Disp: 200 each, Rfl: 0 ?  ALPRAZolam (XANAX) 0.25 MG tablet, Take 1 tablet (0.25 mg total) by mouth 2 (two) times daily., Disp: 60 tablet, Rfl: 2 ?  Blood Glucose Calibration (ACCU-CHEK GUIDE CONTROL) LIQD, Use for glucometer, Disp: 1 each, Rfl: 0 ?  Blood Glucose Monitoring Suppl (ACCU-CHEK GUIDE) w/Device KIT, Use to check blood sugar up to 2 x daily, Disp: 1 kit, Rfl: 0 ?  budesonide-formoterol (SYMBICORT) 160-4.5 MCG/ACT inhaler, Inhale 2 puffs into the lungs daily. Need future visit for refills, Disp: 10.2 g, Rfl: 0 ?  fluticasone (FLONASE) 50 MCG/ACT nasal spray, Place 2 sprays into both nostrils daily. Use for 4-6 weeks then stop and use seasonally or  as needed., Disp: 16 g, Rfl: 3 ?  insulin aspart (FIASP) 100 UNIT/ML FlexTouch Pen, 6 units prior to meals (okay to switch to any short acting insulin that is covered), Disp: 15 mL, Rfl: 0 ?  insulin detemir (LEVEMIR) 100 UNIT/ML FlexPen, 20 units subcutaneous injection (okay to switch to any long acting insulin that is covered), Disp: 15 mL, Rfl: 0 ?  Insulin Pen Needle 33G X 5 MM MISC, 1 Dose by Does not apply route 3 (three) times daily before  meals., Disp: 200 each, Rfl: 0 ?  loratadine (CLARITIN) 10 MG tablet, Take 1 tablet (10 mg total) by mouth daily., Disp: 30 tablet, Rfl: 0 ?  metFORMIN (GLUCOPHAGE) 500 MG tablet, Take 2 tablets (1,000 mg total) by mouth 2 (two) times daily with a meal., Disp: 360 tablet, Rfl: 1 ?  nicotine (NICODERM CQ - DOSED IN MG/24 HOURS) 14 mg/24hr patch, One patch chest wall daily (substitute generic), Disp: 28 patch, Rfl: 0 ?  ondansetron (ZOFRAN ODT) 4 MG disintegrating tablet, Take 1 tablet (4 mg total) by mouth every 8 (eight) hours as needed for nausea or vomiting., Disp: 30 tablet, Rfl: 0 ?  polyethylene glycol (MIRALAX / GLYCOLAX) 17 g packet, Take 17 g by mouth daily as needed for moderate constipation., Disp: 14 each, Rfl: 0 ?  predniSONE (DELTASONE) 10 MG tablet, 4 tabs po day1; 3 tabs po day2,3; 2 tabs po day4,5; 1 tab po day6,7; 1/2 tab po day 8,9,10,11, Disp: 18 tablet, Rfl: 0 ?  pregabalin (LYRICA) 75 MG capsule, Take 1 capsule (75 mg total) by mouth 2 (two) times daily., Disp: 60 capsule, Rfl: 0 ?  SUMAtriptan (IMITREX) 50 MG tablet, Take 1 tablet (50 mg total) by mouth once as needed for up to 1 dose for migraine. May repeat one dose in 2 hours if headache persists, for max dose 24 hours, Disp: 12 tablet, Rfl: 2 ?  traZODone (DESYREL) 50 MG tablet, Take 300 mg by mouth at bedtime. , Disp: , Rfl: 0 ?  buPROPion (WELLBUTRIN XL) 150 MG 24 hr tablet, Take 1 tablet (150 mg total) by mouth daily., Disp: 90 tablet, Rfl: 1 ? ?------------------------------------------------------------------------- ?Social History  ? ?Tobacco Use  ? Smoking status: Every Day  ?  Packs/day: 0.50  ?  Years: 30.00  ?  Pack years: 15.00  ?  Types: Cigarettes  ? Smokeless tobacco: Never  ?Vaping Use  ? Vaping Use: Never used  ?Substance Use Topics  ? Alcohol use: Yes  ?  Alcohol/week: 0.0 standard drinks  ?  Comment: occasionally   ? Drug use: No  ? ? ?Review of Systems ?Per HPI unless specifically indicated above ? ?   ?Objective:  ?  ?BP  (!) 138/92   Pulse 94   Ht '5\' 7"'  (1.702 m)   Wt 247 lb 9.6 oz (112.3 kg)   LMP 08/11/2014   SpO2 99%   BMI 38.78 kg/m?   ?Wt Readings from Last 3 Encounters:  ?02/15/22 247 lb 9.6 oz (112.3 kg)  ?02/03/22 247 lb 3.2 oz (112.1 kg)  ?02/23/21 225 lb (102.1 kg)  ?  ?Physical Exam ?Vitals and nursing note reviewed.  ?Constitutional:   ?   General: She is not in acute distress. ?   Appearance: She is well-developed. She is not diaphoretic.  ?   Comments: Well-appearing, comfortable, cooperative  ?HENT:  ?   Head: Normocephalic and atraumatic.  ?Eyes:  ?   General:     ?  Right eye: No discharge.     ?   Left eye: No discharge.  ?   Conjunctiva/sclera: Conjunctivae normal.  ?Neck:  ?   Thyroid: No thyromegaly.  ?Cardiovascular:  ?   Rate and Rhythm: Normal rate and regular rhythm.  ?   Heart sounds: Normal heart sounds. No murmur heard. ?Pulmonary:  ?   Effort: Pulmonary effort is normal. No respiratory distress.  ?   Breath sounds: Normal breath sounds. No wheezing or rales.  ?Musculoskeletal:     ?   General: Normal range of motion.  ?   Cervical back: Normal range of motion and neck supple.  ?Lymphadenopathy:  ?   Cervical: No cervical adenopathy.  ?Skin: ?   General: Skin is warm and dry.  ?   Findings: No erythema or rash.  ?Neurological:  ?   Mental Status: She is alert and oriented to person, place, and time.  ?Psychiatric:     ?   Behavior: Behavior normal.  ?   Comments: Well groomed, good eye contact, normal speech and thoughts  ? ? ? ?Results for orders placed or performed during the hospital encounter of 01/26/22  ?Resp Panel by RT-PCR (Flu A&B, Covid) Nasopharyngeal Swab  ? Specimen: Nasopharyngeal Swab; Nasopharyngeal(NP) swabs in vial transport medium  ?Result Value Ref Range  ? SARS Coronavirus 2 by RT PCR NEGATIVE NEGATIVE  ? Influenza A by PCR NEGATIVE NEGATIVE  ? Influenza B by PCR NEGATIVE NEGATIVE  ?Culture, blood (Routine X 2) w Reflex to ID Panel  ? Specimen: Right Antecubital; Blood  ?Result  Value Ref Range  ? Specimen Description RIGHT ANTECUBITAL   ? Special Requests    ?  BOTTLES DRAWN AEROBIC AND ANAEROBIC Blood Culture adequate volume  ? Culture    ?  NO GROWTH 5 DAYS ?Performed at [facility]

## 2022-02-21 ENCOUNTER — Telehealth: Payer: Medicaid Other | Admitting: Physician Assistant

## 2022-02-21 DIAGNOSIS — M546 Pain in thoracic spine: Secondary | ICD-10-CM

## 2022-02-21 DIAGNOSIS — R3989 Other symptoms and signs involving the genitourinary system: Secondary | ICD-10-CM

## 2022-02-21 DIAGNOSIS — R509 Fever, unspecified: Secondary | ICD-10-CM

## 2022-02-21 NOTE — Progress Notes (Signed)
Based on what you shared with me, I feel your condition warrants further evaluation and I recommend that you be seen in a face to face visit. ? ?With having a fever, pain, and dark urine, you should be evaluated ASAP in person to have a urine specimen collected and to have your kidney function (checked by blood work) evaluated. We need to determine if this is just a UTI or is this an acute kidney injury from dehydration (from recent GI bug).  ?  ?NOTE: There will be NO CHARGE for this eVisit ?  ?If you are having a true medical emergency please call 911.   ?  ? For an urgent face to face visit, [place] has six urgent care centers for your convenience:  ?  ? [facility] at [facility] ?Get Driving Directions ?[phone] ?[phone] ?[place], [place] [address] ?  ? [facility]) ?Get Driving Directions ?[phone] ?[address] ?[place], [place] [address] ? ?[facility]) ?Get Driving Directions ?[phone] ?[address] ?[place],  [place]  [address] ? ?[facility] ?Get Driving Directions ?[phone] ?[address] ?[place], [place] [address] ?  ?[facility] [place] ?Get Driving Directions  ?[phone] ?[address] ?[address] ?[place], [place] [address] ?  ?[facility] ?Get Driving Directions ?[phone] ?[address] ?[place], [place] [address] ? ?Your MyChart E-visit questionnaire answers were reviewed by a board certified advanced clinical practitioner to complete your personal care plan based on your specific symptoms.  Thank you for using e-Visits. ?  ?I provided 5 minutes of non face-to-face time during this encounter for chart review and documentation.  ? ?

## 2022-02-28 ENCOUNTER — Encounter: Payer: Self-pay | Admitting: Family Medicine

## 2022-02-28 DIAGNOSIS — F411 Generalized anxiety disorder: Secondary | ICD-10-CM

## 2022-02-28 MED ORDER — ALPRAZOLAM 0.25 MG PO TABS: 0.2500 mg | ORAL_TABLET | Freq: Two times a day (BID) | ORAL | 2 refills | Status: DC

## 2022-03-07 ENCOUNTER — Encounter: Payer: Self-pay | Admitting: Family Medicine

## 2022-03-07 DIAGNOSIS — J432 Centrilobular emphysema: Secondary | ICD-10-CM

## 2022-03-07 DIAGNOSIS — G43009 Migraine without aura, not intractable, without status migrainosus: Secondary | ICD-10-CM

## 2022-03-07 DIAGNOSIS — F339 Major depressive disorder, recurrent, unspecified: Secondary | ICD-10-CM

## 2022-03-08 MED ORDER — TRAZODONE HCL 50 MG PO TABS: 300.0000 mg | ORAL_TABLET | Freq: Every day | ORAL | 1 refills | Status: DC

## 2022-03-08 MED ORDER — SUMATRIPTAN SUCCINATE 50 MG PO TABS: 50.0000 mg | ORAL_TABLET | Freq: Once | ORAL | 3 refills | Status: DC | PRN

## 2022-03-10 MED ORDER — BUDESONIDE-FORMOTEROL FUMARATE 160-4.5 MCG/ACT IN AERO: 2.0000 | INHALATION_SPRAY | Freq: Every day | RESPIRATORY_TRACT | 5 refills | Status: DC

## 2022-03-10 MED ORDER — VENTOLIN HFA 108 (90 BASE) MCG/ACT IN AERS: 1.0000 | INHALATION_SPRAY | Freq: Four times a day (QID) | RESPIRATORY_TRACT | 5 refills | Status: DC | PRN

## 2022-03-10 MED ORDER — LORATADINE 10 MG PO TABS: 10.0000 mg | ORAL_TABLET | Freq: Every day | ORAL | 3 refills | Status: DC

## 2022-03-10 MED ORDER — ALBUTEROL SULFATE HFA 108 (90 BASE) MCG/ACT IN AERS: 2.0000 | INHALATION_SPRAY | Freq: Four times a day (QID) | RESPIRATORY_TRACT | 5 refills | Status: DC | PRN

## 2022-03-10 NOTE — Addendum Note (Signed)
Addended by: Smitty Cords on: 03/10/2022 02:33 PM   Modules accepted: Orders

## 2022-03-10 NOTE — Addendum Note (Signed)
Addended by: Olin Hauser on: 03/10/2022 04:16 PM   Modules accepted: Orders

## 2022-03-11 ENCOUNTER — Encounter: Payer: Self-pay | Admitting: Family Medicine

## 2022-03-11 ENCOUNTER — Telehealth (INDEPENDENT_AMBULATORY_CARE_PROVIDER_SITE_OTHER): Payer: Medicaid Other | Admitting: Family Medicine

## 2022-03-11 VITALS — Wt 247.0 lb

## 2022-03-11 DIAGNOSIS — Z794 Long term (current) use of insulin: Secondary | ICD-10-CM

## 2022-03-11 DIAGNOSIS — M4722 Other spondylosis with radiculopathy, cervical region: Secondary | ICD-10-CM | POA: Diagnosis not present

## 2022-03-11 DIAGNOSIS — E1165 Type 2 diabetes mellitus with hyperglycemia: Secondary | ICD-10-CM | POA: Diagnosis not present

## 2022-03-11 DIAGNOSIS — G43909 Migraine, unspecified, not intractable, without status migrainosus: Secondary | ICD-10-CM | POA: Diagnosis not present

## 2022-03-11 MED ORDER — TRULICITY 0.75 MG/0.5ML ~~LOC~~ SOAJ: 0.7500 mg | SUBCUTANEOUS | 2 refills | Status: DC

## 2022-03-11 MED ORDER — UBRELVY 50 MG PO TABS: ORAL_TABLET | ORAL | 5 refills | Status: DC

## 2022-03-11 MED ORDER — GABAPENTIN 300 MG PO CAPS: 300.0000 mg | ORAL_CAPSULE | Freq: Three times a day (TID) | ORAL | 2 refills | Status: DC

## 2022-03-11 NOTE — Progress Notes (Addendum)
Subjective:    Patient ID: Christy Mayer, female    DOB: 1965/11/30, 56 y.o.   MRN: [id number]  Christy Mayer is a 56 y.o. female presenting on 03/11/2022 for Headache, Back Pain, and Nausea  Virtual / Telehealth Encounter - Video Visit via MyChart The purpose of this virtual visit is to provide medical care while limiting exposure to the novel coronavirus (COVID19) for both patient and office staff.  Consent was obtained for remote visit:  Yes.   Answered questions that patient had about telehealth interaction:  Yes.   I discussed the limitations, risks, security and privacy concerns of performing an evaluation and management service by video/telephone. I also discussed with the patient that there may be a patient responsible charge related to this service. The patient expressed understanding and agreed to proceed.  Patient Location: Home Provider Location: [facility] (Office)  Participants in virtual visit: - Patient: Christy Mayer - CMA: Orinda Kenner, CMA - Provider: Dr Parks Ranger   HPI  Migraine episodic L side of head Blurry vision, double Started 5-7 days after metformin triggering migraine Thinks that the metformin caused migraine Imitrex not controlling failed In past failed Topamax as well  Diabetes, type 2, uncontrolled Prior A1c >8 Goal < 170, she is running 260+ Reduced starches diet On daily insulin and mealtime and metformin, having side effect on metformin  Back Pain Pregabalin ran out today, asking about switch to Gabapentin. Used to take 300 1-2 TID     02/15/2022    1:21 PM 04/26/2019    9:56 AM 03/07/2019    9:45 AM  Depression screen PHQ 2/9  Decreased Interest 0 1 0  Down, Depressed, Hopeless 1 0 0  PHQ - 2 Score 1 1 0  Altered sleeping 1 0   Tired, decreased energy 3 1   Change in appetite 0 0   Feeling bad or failure about yourself  0 0   Trouble concentrating 0 0   Moving slowly or fidgety/restless 0 0   Suicidal  thoughts 0 0   PHQ-9 Score 5 2   Difficult doing work/chores Not difficult at all Somewhat difficult     Social History   Tobacco Use   Smoking status: Every Day    Packs/day: 0.50    Years: 30.00    Pack years: 15.00    Types: Cigarettes   Smokeless tobacco: Never  Vaping Use   Vaping Use: Never used  Substance Use Topics   Alcohol use: Yes    Alcohol/week: 0.0 standard drinks    Comment: occasionally    Drug use: No    Review of Systems Per HPI unless specifically indicated above     Objective:    Wt 247 lb (112 kg)   LMP 08/11/2014   BMI 38.69 kg/m   Wt Readings from Last 3 Encounters:  03/11/22 247 lb (112 kg)  02/15/22 247 lb 9.6 oz (112.3 kg)  02/03/22 247 lb 3.2 oz (112.1 kg)    Physical Exam  Note examination was completely remotely via video observation objective data only  Gen - well-appearing, no acute distress or apparent pain, comfortable HEENT - eyes appear clear without discharge or redness Heart/Lungs - cannot examine virtually - observed no evidence of coughing or labored breathing. Abd - cannot examine virtually  Skin - face visible today- no rash Neuro - awake, alert, oriented Psych - not anxious appearing  Results for orders placed or performed during the hospital encounter  of 01/26/22  Resp Panel by RT-PCR (Flu A&B, Covid) Nasopharyngeal Swab   Specimen: Nasopharyngeal Swab; Nasopharyngeal(NP) swabs in vial transport medium  Result Value Ref Range   SARS Coronavirus 2 by RT PCR NEGATIVE NEGATIVE   Influenza A by PCR NEGATIVE NEGATIVE   Influenza B by PCR NEGATIVE NEGATIVE  Culture, blood (Routine X 2) w Reflex to ID Panel   Specimen: Right Antecubital; Blood  Result Value Ref Range   Specimen Description RIGHT ANTECUBITAL    Special Requests      BOTTLES DRAWN AEROBIC AND ANAEROBIC Blood Culture adequate volume   Culture      NO GROWTH 5 DAYS Performed at [facility], [address] [place], [place] [address]     Report Status 01/31/2022 FINAL   Culture, blood (Routine X 2) w Reflex to ID Panel   Specimen: BLOOD  Result Value Ref Range   Specimen Description BLOOD RIGHT ARM    Special Requests      BOTTLES DRAWN AEROBIC AND ANAEROBIC Blood Culture adequate volume   Culture      NO GROWTH 5 DAYS Performed at [facility], [address] [place], [place] [address]    Report Status 01/31/2022 FINAL   Respiratory (~20 pathogens) panel by PCR   Specimen: Nasopharyngeal Swab; Respiratory  Result Value Ref Range   Adenovirus NOT DETECTED NOT DETECTED   Coronavirus 229E NOT DETECTED NOT DETECTED   Coronavirus HKU1 NOT DETECTED NOT DETECTED   Coronavirus NL63 NOT DETECTED NOT DETECTED   Coronavirus OC43 NOT DETECTED NOT DETECTED   Metapneumovirus NOT DETECTED NOT DETECTED   Rhinovirus / Enterovirus NOT DETECTED NOT DETECTED   Influenza A NOT DETECTED NOT DETECTED   Influenza B NOT DETECTED NOT DETECTED   Parainfluenza Virus 1 NOT DETECTED NOT DETECTED   Parainfluenza Virus 2 NOT DETECTED NOT DETECTED   Parainfluenza Virus 3 NOT DETECTED NOT DETECTED   Parainfluenza Virus 4 NOT DETECTED NOT DETECTED   Respiratory Syncytial Virus NOT DETECTED NOT DETECTED   Bordetella pertussis NOT DETECTED NOT DETECTED   Bordetella Parapertussis NOT DETECTED NOT DETECTED   Chlamydophila pneumoniae NOT DETECTED NOT DETECTED   Mycoplasma pneumoniae NOT DETECTED NOT DETECTED  MRSA Next Gen by PCR, Nasal   Specimen: Nasal Mucosa; Nasal Swab  Result Value Ref Range   MRSA by PCR Next Gen NOT DETECTED NOT DETECTED  Basic metabolic panel  Result Value Ref Range   Sodium 137 135 - 145 mmol/L   Potassium 3.7 3.5 - 5.1 mmol/L   Chloride 102 98 - 111 mmol/L   CO2 23 22 - 32 mmol/L   Glucose, Bld 181 (H) 70 - 99 mg/dL   BUN 10 6 - 20 mg/dL   Creatinine, Ser 0.67 0.44 - 1.00 mg/dL   Calcium 9.3 8.9 - 10.3 mg/dL   GFR, Estimated >60 >60 mL/min   Anion gap 12 5 - 15  CBC  Result Value Ref Range   WBC 7.7  4.0 - 10.5 K/uL   RBC 4.87 3.87 - 5.11 MIL/uL   Hemoglobin 15.5 (H) 12.0 - 15.0 g/dL   HCT 45.2 36.0 - 46.0 %   MCV 92.8 80.0 - 100.0 fL   MCH 31.8 26.0 - 34.0 pg   MCHC 34.3 30.0 - 36.0 g/dL   RDW 11.8 11.5 - 15.5 %   Platelets 262 150 - 400 K/uL   nRBC 0.0 0.0 - 0.2 %  CBC  Result Value Ref Range   WBC 8.9 4.0 -  10.5 K/uL   RBC 4.50 3.87 - 5.11 MIL/uL   Hemoglobin 14.4 12.0 - 15.0 g/dL   HCT 42.5 36.0 - 46.0 %   MCV 94.4 80.0 - 100.0 fL   MCH 32.0 26.0 - 34.0 pg   MCHC 33.9 30.0 - 36.0 g/dL   RDW 11.8 11.5 - 15.5 %   Platelets 236 150 - 400 K/uL   nRBC 0.0 0.0 - 0.2 %  HIV Antibody (routine testing w rflx)  Result Value Ref Range   HIV Screen 4th Generation wRfx Non Reactive Non Reactive  CBC  Result Value Ref Range   WBC 11.6 (H) 4.0 - 10.5 K/uL   RBC 4.51 3.87 - 5.11 MIL/uL   Hemoglobin 14.4 12.0 - 15.0 g/dL   HCT 43.8 36.0 - 46.0 %   MCV 97.1 80.0 - 100.0 fL   MCH 31.9 26.0 - 34.0 pg   MCHC 32.9 30.0 - 36.0 g/dL   RDW 11.8 11.5 - 15.5 %   Platelets 156 150 - 400 K/uL   nRBC 0.0 0.0 - 0.2 %  Basic metabolic panel  Result Value Ref Range   Sodium 130 (L) 135 - 145 mmol/L   Potassium 5.1 3.5 - 5.1 mmol/L   Chloride 97 (L) 98 - 111 mmol/L   CO2 21 (L) 22 - 32 mmol/L   Glucose, Bld 471 (H) 70 - 99 mg/dL   BUN 21 (H) 6 - 20 mg/dL   Creatinine, Ser 0.97 0.44 - 1.00 mg/dL   Calcium 9.0 8.9 - 10.3 mg/dL   GFR, Estimated >60 >60 mL/min   Anion gap 12 5 - 15  Hemoglobin A1c  Result Value Ref Range   Hgb A1c MFr Bld 8.3 (H) 4.8 - 5.6 %   Mean Plasma Glucose 191.51 mg/dL  Glucose, capillary  Result Value Ref Range   Glucose-Capillary 580 (HH) 70 - 99 mg/dL   Comment 1 Document in Chart   Glucose, capillary  Result Value Ref Range   Glucose-Capillary 475 (H) 70 - 99 mg/dL  Glucose, capillary  Result Value Ref Range   Glucose-Capillary 544 (HH) 70 - 99 mg/dL   Comment 1 Call MD NNP PA CNM   Glucose, capillary  Result Value Ref Range   Glucose-Capillary 430 (H)  70 - 99 mg/dL   Comment 1 Notify RN   Basic metabolic panel  Result Value Ref Range   Sodium 133 (L) 135 - 145 mmol/L   Potassium 4.4 3.5 - 5.1 mmol/L   Chloride 94 (L) 98 - 111 mmol/L   CO2 32 22 - 32 mmol/L   Glucose, Bld 290 (H) 70 - 99 mg/dL   BUN 27 (H) 6 - 20 mg/dL   Creatinine, Ser 0.91 0.44 - 1.00 mg/dL   Calcium 9.3 8.9 - 10.3 mg/dL   GFR, Estimated >60 >60 mL/min   Anion gap 7 5 - 15  CBC  Result Value Ref Range   WBC 11.3 (H) 4.0 - 10.5 K/uL   RBC 4.61 3.87 - 5.11 MIL/uL   Hemoglobin 14.7 12.0 - 15.0 g/dL   HCT 43.4 36.0 - 46.0 %   MCV 94.1 80.0 - 100.0 fL   MCH 31.9 26.0 - 34.0 pg   MCHC 33.9 30.0 - 36.0 g/dL   RDW 11.7 11.5 - 15.5 %   Platelets 213 150 - 400 K/uL   nRBC 0.0 0.0 - 0.2 %  Glucose, capillary  Result Value Ref Range   Glucose-Capillary 263 (H) 70 - 99 mg/dL  Glucose, capillary  Result Value Ref Range   Glucose-Capillary 396 (H) 70 - 99 mg/dL  Glucose, capillary  Result Value Ref Range   Glucose-Capillary 327 (H) 70 - 99 mg/dL   Comment 1 Notify RN    Comment 2 Document in Chart   Legionella Pneumophila Serogp 1 Ur Ag  Result Value Ref Range   L. pneumophila Serogp 1 Ur Ag Negative Negative   Source of Sample URINE, RANDOM   Glucose, capillary  Result Value Ref Range   Glucose-Capillary 438 (H) 70 - 99 mg/dL   Comment 1 Notify RN    Comment 2 Document in Chart   Glucose, capillary  Result Value Ref Range   Glucose-Capillary 452 (H) 70 - 99 mg/dL   Comment 1 Notify RN    Comment 2 Document in Chart   Glucose, capillary  Result Value Ref Range   Glucose-Capillary 336 (H) 70 - 99 mg/dL  Glucose, capillary  Result Value Ref Range   Glucose-Capillary 217 (H) 70 - 99 mg/dL  Glucose, capillary  Result Value Ref Range   Glucose-Capillary 277 (H) 70 - 99 mg/dL  C-reactive protein  Result Value Ref Range   CRP 4.6 (H) <1.0 mg/dL  Basic metabolic panel  Result Value Ref Range   Sodium 139 135 - 145 mmol/L   Potassium 4.0 3.5 - 5.1 mmol/L    Chloride 98 98 - 111 mmol/L   CO2 32 22 - 32 mmol/L   Glucose, Bld 176 (H) 70 - 99 mg/dL   BUN 21 (H) 6 - 20 mg/dL   Creatinine, Ser 0.82 0.44 - 1.00 mg/dL   Calcium 9.2 8.9 - 10.3 mg/dL   GFR, Estimated >60 >60 mL/min   Anion gap 9 5 - 15  Sedimentation rate  Result Value Ref Range   Sed Rate 23 0 - 30 mm/hr  ANA w/Reflex if Positive  Result Value Ref Range   Anti Nuclear Antibody (ANA) Negative Negative  ANCA Profile  Result Value Ref Range   Anti-MPO Antibodies <0.2 0.0 - 0.9 units   Anti-PR3 Antibodies <0.2 0.0 - 0.9 units   C-ANCA <1:20 Neg:<1:20 titer   P-ANCA <1:20 Neg:<1:20 titer   Atypical P-ANCA titer <1:20 Neg:<1:20 titer  CBC with Differential/Platelet  Result Value Ref Range   WBC 10.6 (H) 4.0 - 10.5 K/uL   RBC 4.55 3.87 - 5.11 MIL/uL   Hemoglobin 14.5 12.0 - 15.0 g/dL   HCT 43.0 36.0 - 46.0 %   MCV 94.5 80.0 - 100.0 fL   MCH 31.9 26.0 - 34.0 pg   MCHC 33.7 30.0 - 36.0 g/dL   RDW 11.8 11.5 - 15.5 %   Platelets 181 150 - 400 K/uL   nRBC 0.0 0.0 - 0.2 %   Neutrophils Relative % 70 %   Neutro Abs 7.5 1.7 - 7.7 K/uL   Lymphocytes Relative 22 %   Lymphs Abs 2.3 0.7 - 4.0 K/uL   Monocytes Relative 6 %   Monocytes Absolute 0.6 0.1 - 1.0 K/uL   Eosinophils Relative 1 %   Eosinophils Absolute 0.1 0.0 - 0.5 K/uL   Basophils Relative 0 %   Basophils Absolute 0.0 0.0 - 0.1 K/uL   Immature Granulocytes 1 %   Abs Immature Granulocytes 0.12 (H) 0.00 - 0.07 K/uL  Glucose, capillary  Result Value Ref Range   Glucose-Capillary 325 (H) 70 - 99 mg/dL  Glucose, capillary  Result Value Ref Range   Glucose-Capillary 202 (H) 70 - 99 mg/dL  Glucose, capillary  Result Value Ref Range   Glucose-Capillary 221 (H) 70 - 99 mg/dL  Glucose, capillary  Result Value Ref Range   Glucose-Capillary 239 (H) 70 - 99 mg/dL  Glucose, capillary  Result Value Ref Range   Glucose-Capillary 297 (H) 70 - 99 mg/dL  Glucose, capillary  Result Value Ref Range   Glucose-Capillary 266 (H)  70 - 99 mg/dL   Comment 1 Notify RN   Glucose, capillary  Result Value Ref Range   Glucose-Capillary 214 (H) 70 - 99 mg/dL  Glucose, capillary  Result Value Ref Range   Glucose-Capillary 334 (H) 70 - 99 mg/dL  Glucose, capillary  Result Value Ref Range   Glucose-Capillary 305 (H) 70 - 99 mg/dL  Glucose, capillary  Result Value Ref Range   Glucose-Capillary 244 (H) 70 - 99 mg/dL  Glucose, capillary  Result Value Ref Range   Glucose-Capillary 164 (H) 70 - 99 mg/dL  Glucose, capillary  Result Value Ref Range   Glucose-Capillary 334 (H) 70 - 99 mg/dL  Glucose, capillary  Result Value Ref Range   Glucose-Capillary 303 (H) 70 - 99 mg/dL  Glucose, capillary  Result Value Ref Range   Glucose-Capillary 262 (H) 70 - 99 mg/dL   Comment 1 Notify RN   Glucose, capillary  Result Value Ref Range   Glucose-Capillary 209 (H) 70 - 99 mg/dL   Comment 1 Notify RN    Comment 2 Document in Chart   Glucose, capillary  Result Value Ref Range   Glucose-Capillary 329 (H) 70 - 99 mg/dL   Comment 1 Notify RN    Comment 2 Document in Chart   Troponin I (High Sensitivity)  Result Value Ref Range   Troponin I (High Sensitivity) 8 <18 ng/L      Assessment & Plan:   Problem List Items Addressed This Visit     Uncontrolled type 2 diabetes mellitus with hyperglycemia, with long-term current use of insulin (HCC)   Relevant Medications   TRULICITY [id number] SOPN   Osteoarthritis of spine with radiculopathy, cervical region   Relevant Medications   gabapentin (NEURONTIN) 300 MG capsule   Other Visit Diagnoses     Episodic migraine    -  Primary   Relevant Medications   UBRELVY 50 MG TABS   gabapentin (NEURONTIN) 300 MG capsule       Stop Metformin Start Trulicity 0.75mg  weekly, after 1-3 months we can dose increase to 1.5mg . Scale back on mealtime insulin if fasting sugars start to get down to < 170 Ideally on daily insulin and GLP1  Start Gabapentin 300mg  gradually increase to max 3  times a day  4-14 migraine days per month episodic Currently status migraine Stop Imitrex Start Ubrelvy Take one tab 50mg  for migraine headache, may repeat dose 50mg  within 2 hours of initial dose only. Max dose in 24 hours is 100mg  or 2 tabs  Meds ordered this encounter  Medications   UBRELVY 50 MG TABS    Sig: Take one tab 50mg  for migraine headache, may repeat dose 50mg  within 2 hours of initial dose only. Max dose in 24 hours is 100mg  or 2 tabs    Dispense:  16 tablet    Refill:  5   TRULICITY [id number] SOPN    Sig: Inject 0.75 mg into the skin once a week.    Dispense:  2 mL    Refill:  2   gabapentin (NEURONTIN) 300 MG capsule    Sig: Take 1 capsule (  300 mg total) by mouth 3 (three) times daily.    Dispense:  90 capsule    Refill:  2      Follow up plan: Return if symptoms worsen or fail to improve.   Patient verbalizes understanding with the above medical recommendations including the limitation of remote medical advice.  Specific follow-up and call-back criteria were given for patient to follow-up or seek medical care more urgently if needed.  Total duration of direct patient care provided via video conference: 15 minutes   Nobie Putnam, [facility] 03/11/2022, 4:30 PM

## 2022-03-11 NOTE — Patient Instructions (Addendum)
Thank you for coming to the office today.  Stop Metformin  Start Trulicity 0.75mg  weekly, after 1-3 months we can dose increase to 1.5mg .  Scale back on mealtime insulin if fasting sugars start to get down to < 170  Start Gabapentin 300mg  gradually increase to max 3 times a day  Stop Imitrex  Start Ubrelvy Take one tab 50mg  for migraine headache, may repeat dose 50mg  within 2 hours of initial dose only. Max dose in 24 hours is 100mg  or 2 tabs    Please schedule a Follow-up Appointment to: Return if symptoms worsen or fail to improve.  If you have any other questions or concerns, please feel free to call the office or send a message through MyChart. You may also schedule an earlier appointment if necessary.  Additionally, you may be receiving a survey about your experience at our office within a few days to 1 week by e-mail or mail. We value your feedback.  , DO [facility], 

## 2022-04-11 ENCOUNTER — Encounter: Payer: Self-pay | Admitting: Family Medicine

## 2022-04-11 DIAGNOSIS — G43909 Migraine, unspecified, not intractable, without status migrainosus: Secondary | ICD-10-CM

## 2022-04-14 NOTE — Addendum Note (Signed)
Addended by: Smitty Cords on: 04/14/2022 04:46 PM   Modules accepted: Orders

## 2022-05-20 ENCOUNTER — Ambulatory Visit: Payer: Medicaid Other | Admitting: Family Medicine

## 2022-09-12 ENCOUNTER — Other Ambulatory Visit: Payer: Self-pay | Admitting: Family Medicine

## 2022-09-12 DIAGNOSIS — F411 Generalized anxiety disorder: Secondary | ICD-10-CM

## 2022-09-13 NOTE — Telephone Encounter (Signed)
Requested medication (s) are due for refill today - yes  Requested medication (s) are on the active medication list -yes  Future visit scheduled -no  Last refill: 02/28/22 #60 2RF  Notes to clinic: non delegated Rx  Requested Prescriptions  Pending Prescriptions Disp Refills   ALPRAZolam (XANAX) 0.25 MG tablet [Pharmacy Med Name: ALPRAZOLAM 0.25MG  TABLETS] 60 tablet     Sig: TAKE 1 TABLET(0.25 MG) BY MOUTH TWICE DAILY     Not Delegated - Psychiatry: Anxiolytics/Hypnotics 2 Failed - 09/12/2022  4:28 PM      Failed - This refill cannot be delegated      Failed - Urine Drug Screen completed in last 360 days      Failed - Valid encounter within last 6 months    Recent Outpatient Visits           6 months ago Episodic migraine   [facility] Smitty Cords, DO   7 months ago COPD exacerbation [facility])   [facility] Smitty Cords, DO   1 year ago Acute non-recurrent frontal sinusitis   [facility] [place], Netta Neat, DO   3 years ago COPD with acute exacerbation [facility])   [facility] Smitty Cords, DO   3 years ago Intractable vomiting with nausea, unspecified vomiting type   [facility], Netta Neat, [place] - Patient is not pregnant         Requested Prescriptions  Pending Prescriptions Disp Refills   ALPRAZolam (XANAX) 0.25 MG tablet [Pharmacy Med Name: ALPRAZOLAM 0.25MG  TABLETS] 60 tablet     Sig: TAKE 1 TABLET(0.25 MG) BY MOUTH TWICE DAILY     Not Delegated - Psychiatry: Anxiolytics/Hypnotics 2 Failed - 09/12/2022  4:28 PM      Failed - This refill cannot be delegated      Failed - Urine Drug Screen completed in last 360 days      Failed - Valid encounter within last 6 months    Recent Outpatient Visits           6 months ago Episodic migraine   [facility] Smitty Cords, DO   7 months ago COPD  exacerbation [facility])   [facility] Smitty Cords, DO   1 year ago Acute non-recurrent frontal sinusitis   [facility] [place], Netta Neat, DO   3 years ago COPD with acute exacerbation [facility])   [facility] Smitty Cords, DO   3 years ago Intractable vomiting with nausea, unspecified vomiting type   [facility] [place], Netta Neat, [place] - Patient is not pregnant

## 2022-09-16 ENCOUNTER — Ambulatory Visit: Payer: Self-pay

## 2022-09-16 NOTE — Telephone Encounter (Signed)
  Chief Complaint: SOB Symptoms: SOB at times worse than normal, feeling crackling in L lung, coughing Frequency: 1 week or more  Pertinent Negatives: NA Disposition: [x] ED /[] Urgent Care (no appt availability in office) / [] Appointment(In office/virtual)/ []  [facility]/ [] Home Care/ [] Refused Recommended Disposition /[] [place] [facility]/ []  Follow-up with PCP Additional Notes: pt states she is using neb tx more often currently and then feeling discomfort when lying on R side in L lung. Pt states she wants to wait and come to OV on 09/19/22. Advised pt ED would be best but she can monitor and if sx get worse before appt to be sure to go to ED. Pt verbalized understanding and said she wont let it get as bad as when she was in hospital in 04/23 for PNA.   Reason for Disposition  [1] MILD difficulty breathing (e.g., minimal/no SOB at rest, SOB with walking, pulse <100) AND [2] NEW-onset or WORSE than normal  Answer Assessment - Initial Assessment Questions 1. RESPIRATORY STATUS: "Describe your breathing?" (e.g., wheezing, shortness of breath, unable to speak, severe coughing)      SOB 2. ONSET: "When did this breathing problem begin?"      1  week or more 3. PATTERN "Does the difficult breathing come and go, or has it been constant since it started?"      constant 4. SEVERITY: "How bad is your breathing?" (e.g., mild, moderate, severe)    - MILD: No SOB at rest, mild SOB with walking, speaks normally in sentences, can lie down, no retractions, pulse < 100.    - MODERATE: SOB at rest, SOB with minimal exertion and prefers to sit, cannot lie down flat, speaks in phrases, mild retractions, audible wheezing, pulse 100-120.    - SEVERE: Very SOB at rest, speaks in single words, struggling to breathe, sitting hunched forward, retractions, pulse > 120      Moderate 7. LUNG HISTORY: "Do you have any history of lung disease?"  (e.g., pulmonary embolus, asthma, emphysema)     Emphysema   9. OTHER SYMPTOMS: "Do you have any other symptoms? (e.g., dizziness, runny nose, cough, chest pain, fever)     Cough, feeling crackling,  Protocols used: Breathing Difficulty-A-AH

## 2022-09-19 ENCOUNTER — Ambulatory Visit: Payer: Medicaid Other | Admitting: Family Medicine

## 2022-10-17 ENCOUNTER — Other Ambulatory Visit: Payer: Self-pay | Admitting: Family Medicine

## 2022-10-17 DIAGNOSIS — M4722 Other spondylosis with radiculopathy, cervical region: Secondary | ICD-10-CM

## 2022-10-17 DIAGNOSIS — G43909 Migraine, unspecified, not intractable, without status migrainosus: Secondary | ICD-10-CM

## 2022-10-18 NOTE — Telephone Encounter (Signed)
Requested Prescriptions  Pending Prescriptions Disp Refills   gabapentin (NEURONTIN) 300 MG capsule [Pharmacy Med Name: GABAPENTIN 300MG  CAPSULES] 90 capsule 2    Sig: TAKE 1 CAPSULE(300 MG) BY MOUTH THREE TIMES DAILY     Neurology: Anticonvulsants - gabapentin Passed - 10/17/2022  8:09 AM      Passed - Cr in normal range and within 360 days    Creat  Date Value Ref Range Status  07/30/2018 0.89 0.50 - 1.05 mg/dL Final    Comment:    For patients >64 years of age, the reference limit for Creatinine is approximately 13% higher for people identified as African-American. .    Creatinine, Ser  Date Value Ref Range Status  02/05/2022 0.82 0.44 - 1.00 mg/dL Final         Passed - Completed PHQ-2 or PHQ-9 in the last 360 days      Passed - Valid encounter within last 12 months    Recent Outpatient Visits           7 months ago Episodic migraine   [place], DO   8 months ago COPD exacerbation [facility])   [facility] Olin Hauser, DO   1 year ago Acute non-recurrent frontal sinusitis   [place], DO   3 years ago COPD with acute exacerbation [facility])   [place], DO   3 years ago Intractable vomiting with nausea, unspecified vomiting type   [place], Devonne Doughty, [place]

## 2022-10-27 ENCOUNTER — Other Ambulatory Visit: Payer: Self-pay | Admitting: Family Medicine

## 2022-10-27 DIAGNOSIS — F339 Major depressive disorder, recurrent, unspecified: Secondary | ICD-10-CM

## 2022-10-27 NOTE — Telephone Encounter (Signed)
Requested medications are due for refill today.  yes  Requested medications are on the active medications list.  yes  Last refill. 02/15/2022 #90 1 rf  Future visit scheduled.   no  Notes to clinic.  Labs are expired. Pt needs an office visit.    Requested Prescriptions  Pending Prescriptions Disp Refills   buPROPion (WELLBUTRIN XL) 150 MG 24 hr tablet [Pharmacy Med Name: BUPROPION XL 150MG  TABLETS (24 H)] 90 tablet 1    Sig: TAKE 1 TABLET BY MOUTH EVERY DAY     Psychiatry: Antidepressants - bupropion Failed - 10/27/2022  8:09 AM      Failed - AST in normal range and within 360 days    AST  Date Value Ref Range Status  11/23/2020 22 15 - 41 U/L Final   SGOT(AST)  Date Value Ref Range Status  01/07/2015 24 U/L Final    Comment:    15-41 NOTE: New Reference Range  12/16/14          Failed - ALT in normal range and within 360 days    ALT  Date Value Ref Range Status  11/23/2020 31 0 - 44 U/L Final   SGPT (ALT)  Date Value Ref Range Status  01/07/2015 21 U/L Final    Comment:    14-54 NOTE: New Reference Range  12/16/14          Failed - Last BP in normal range    BP Readings from Last 1 Encounters:  02/15/22 (!) 138/92         Failed - Valid encounter within last 6 months    Recent Outpatient Visits           7 months ago Episodic migraine   [facility] Olin Hauser, DO   8 months ago COPD exacerbation [facility])   [facility], DO   1 year ago Acute non-recurrent frontal sinusitis   [place], Devonne Doughty, DO   3 years ago COPD with acute exacerbation [facility])   [place], DO   3 years ago Intractable vomiting with nausea, unspecified vomiting type   [place], DO              Passed - Cr in normal range and within 360 days    Creat  Date Value Ref Range Status  07/30/2018  0.89 0.50 - 1.05 mg/dL Final    Comment:    For patients >53 years of age, the reference limit for Creatinine is approximately 13% higher for people identified as African-American. .    Creatinine, Ser  Date Value Ref Range Status  02/05/2022 0.82 0.44 - 1.00 mg/dL Final         Passed - Completed PHQ-2 or PHQ-9 in the last 360 days

## 2022-12-30 ENCOUNTER — Other Ambulatory Visit: Payer: Self-pay | Admitting: Family Medicine

## 2022-12-30 DIAGNOSIS — F411 Generalized anxiety disorder: Secondary | ICD-10-CM

## 2022-12-31 ENCOUNTER — Encounter: Payer: Self-pay | Admitting: Family Medicine

## 2022-12-31 DIAGNOSIS — F339 Major depressive disorder, recurrent, unspecified: Secondary | ICD-10-CM

## 2023-01-02 MED ORDER — BUPROPION HCL ER (XL) 150 MG PO TB24: 150.0000 mg | ORAL_TABLET | Freq: Every day | ORAL | 0 refills | Status: DC

## 2023-01-02 NOTE — Telephone Encounter (Signed)
Requested medications are due for refill today.  Provider to determine  Requested medications are on the active medications list.  yes  Last refill. 09/13/2022 #60 2 rf  Future visit scheduled.   no  Notes to clinic.  Refill not delegated.    Requested Prescriptions  Pending Prescriptions Disp Refills   ALPRAZolam (XANAX) 0.25 MG tablet [Pharmacy Med Name: ALPRAZOLAM 0.25MG  TABLETS] 60 tablet     Sig: TAKE 1 TABLET(0.25 MG) BY MOUTH TWICE DAILY     Not Delegated - Psychiatry: Anxiolytics/Hypnotics 2 Failed - 12/30/2022  1:25 PM      Failed - This refill cannot be delegated      Failed - Urine Drug Screen completed in last 360 days      Failed - Valid encounter within last 6 months    Recent Outpatient Visits           9 months ago Episodic migraine   [place], DO   10 months ago COPD exacerbation [facility])   [place], DO   1 year ago Acute non-recurrent frontal sinusitis   [place], [place]   3 years ago COPD with acute exacerbation [facility])   [place], DO   3 years ago Intractable vomiting with nausea, unspecified vomiting type   [place], [place], [place] - Patient is not pregnant

## 2023-01-02 NOTE — Addendum Note (Signed)
Addended by: Olin Hauser on: 01/02/2023 07:26 PM   Modules accepted: Orders

## 2023-02-10 ENCOUNTER — Telehealth: Payer: Self-pay

## 2023-02-10 NOTE — Progress Notes (Signed)
   Care Guide Note  02/10/2023 Name: CLARITY WI MRN: [id number] DOB: 06-15-1966  Referred by: Smitty Cords, DO Reason for referral : Care Coordination (Outreach to schedule with Pharm d NEW MM DM )   LILLIEN FORKUM is a 57 y.o. year old female who is a primary care patient of Smitty Cords, DO. SHAGUN COOMER was referred to the pharmacist for assistance related to DM.    An unsuccessful telephone outreach was attempted today to contact the patient who was referred to the pharmacy team for assistance with medication management. Additional attempts will be made to contact the patient.   Penne Lash, RMA Care Guide [facility]  [place], [place] [address] Direct Dial: [phone] [email or web address]

## 2023-02-22 NOTE — Progress Notes (Signed)
   Care Guide Note  02/22/2023 Name: Christy Mayer MRN: [id number] DOB: 12/15/1965  Referred by: Smitty Cords, DO Reason for referral : Care Coordination (Outreach to schedule with Pharm d NEW MM DM )   Christy Mayer is a 57 y.o. year old female who is a primary care patient of Smitty Cords, DO. Christy Mayer was referred to the pharmacist for assistance related to DM.    A second unsuccessful telephone outreach was attempted today to contact the patient who was referred to the pharmacy team for assistance with medication management. Additional attempts will be made to contact the patient.  Penne Lash, RMA Care Guide [facility]  [place], [place] [address] Direct Dial: [phone] [email or web address]

## 2023-07-14 ENCOUNTER — Other Ambulatory Visit: Payer: Self-pay | Admitting: Family Medicine

## 2023-07-14 DIAGNOSIS — Z1211 Encounter for screening for malignant neoplasm of colon: Secondary | ICD-10-CM

## 2023-07-14 DIAGNOSIS — Z1212 Encounter for screening for malignant neoplasm of rectum: Secondary | ICD-10-CM

## 2023-09-06 ENCOUNTER — Encounter: Payer: Self-pay | Admitting: Family Medicine

## 2023-09-06 DIAGNOSIS — E1165 Type 2 diabetes mellitus with hyperglycemia: Secondary | ICD-10-CM

## 2023-09-13 ENCOUNTER — Other Ambulatory Visit: Payer: Self-pay | Admitting: *Deleted

## 2023-09-13 NOTE — Patient Instructions (Signed)
Visit Information  [name]  - as a part of your Medicaid benefit, you are eligible for care management and care coordination services at no cost or copay. I was unable to reach you by phone today but would be happy to help you with your health related needs. Please feel free to call me @ [phone].   A member of the Managed Medicaid care management team will reach out to you again over the next 7 days.   Estanislado Emms RN, BSN [place]  Value-Based [facility] RN Care Coordinator [phone]

## 2023-09-13 NOTE — Patient Outreach (Signed)
  Medicaid Managed Care   Unsuccessful Attempt Note   09/13/2023 Name: Christy Mayer MRN: [id number] DOB: Oct 31, 1965  Referred by: Smitty Cords, DO Reason for referral : High Risk Managed Medicaid (Unsuccessful RNCM initial telephone outreach)   An unsuccessful telephone outreach was attempted today. The patient was referred to the case management team for assistance with care management and care coordination.    Follow Up Plan: A HIPAA compliant phone message was left for the patient providing contact information and requesting a return call. and The Managed Medicaid care management team will reach out to the patient again over the next 7 days.    Estanislado Emms RN, BSN [place]  Value[facility]Based [facility] RN Care Coordinator [phone]

## 2023-09-15 ENCOUNTER — Telehealth: Payer: Self-pay

## 2023-09-15 NOTE — Telephone Encounter (Signed)
..   Medicaid Managed Care   Unsuccessful Outreach Note  09/15/2023 Name: Christy Mayer MRN: [id number] DOB: 04-17-1966  Referred by: Smitty Cords, DO Reason for referral : Appointment (I called the patient today to get her missed phone appointment with the MM RNCM rescheduled. I had to leave a message on her VM.)   A second unsuccessful telephone outreach was attempted today. The patient was referred to the case management team for assistance with care management and care coordination.   Follow Up Plan: A HIPAA compliant phone message was left for the patient providing contact information and requesting a return call.  The care management team will reach out to the patient again over the next 7 days.   Weston Settle Care Guide  [facility] Managed  Care Guide [facility]  [phone]

## 2023-09-20 DIAGNOSIS — R739 Hyperglycemia, unspecified: Secondary | ICD-10-CM | POA: Diagnosis not present

## 2023-09-20 DIAGNOSIS — R0902 Hypoxemia: Secondary | ICD-10-CM | POA: Diagnosis not present

## 2023-09-20 DIAGNOSIS — T50904A Poisoning by unspecified drugs, medicaments and biological substances, undetermined, initial encounter: Secondary | ICD-10-CM | POA: Diagnosis not present

## 2023-09-20 DIAGNOSIS — T887XXA Unspecified adverse effect of drug or medicament, initial encounter: Secondary | ICD-10-CM | POA: Diagnosis not present

## 2023-09-20 DIAGNOSIS — R0689 Other abnormalities of breathing: Secondary | ICD-10-CM | POA: Diagnosis not present

## 2023-09-21 ENCOUNTER — Encounter: Payer: Self-pay | Admitting: Emergency Medicine

## 2023-09-21 ENCOUNTER — Inpatient Hospital Stay
Admission: EM | Admit: 2023-09-21 | Discharge: 2023-09-24 | DRG: 918 | Disposition: A | Discharge status: Other Acute Inpatient Hospital | Payer: Medicaid Other | Attending: Internal Medicine | Admitting: Internal Medicine

## 2023-09-21 ENCOUNTER — Emergency Department: Payer: Medicaid Other

## 2023-09-21 DIAGNOSIS — F419 Anxiety disorder, unspecified: Secondary | ICD-10-CM | POA: Insufficient documentation

## 2023-09-21 DIAGNOSIS — I341 Nonrheumatic mitral (valve) prolapse: Secondary | ICD-10-CM | POA: Diagnosis present

## 2023-09-21 DIAGNOSIS — E1169 Type 2 diabetes mellitus with other specified complication: Secondary | ICD-10-CM

## 2023-09-21 DIAGNOSIS — E876 Hypokalemia: Secondary | ICD-10-CM

## 2023-09-21 DIAGNOSIS — T43212A Poisoning by selective serotonin and norepinephrine reuptake inhibitors, intentional self-harm, initial encounter: Principal | ICD-10-CM | POA: Diagnosis present

## 2023-09-21 DIAGNOSIS — E871 Hypo-osmolality and hyponatremia: Secondary | ICD-10-CM | POA: Diagnosis present

## 2023-09-21 DIAGNOSIS — Z79899 Other long term (current) drug therapy: Secondary | ICD-10-CM

## 2023-09-21 DIAGNOSIS — E1165 Type 2 diabetes mellitus with hyperglycemia: Secondary | ICD-10-CM | POA: Diagnosis not present

## 2023-09-21 DIAGNOSIS — F332 Major depressive disorder, recurrent severe without psychotic features: Secondary | ICD-10-CM

## 2023-09-21 DIAGNOSIS — T50901A Poisoning by unspecified drugs, medicaments and biological substances, accidental (unintentional), initial encounter: Secondary | ICD-10-CM | POA: Diagnosis present

## 2023-09-21 DIAGNOSIS — F1090 Alcohol use, unspecified, uncomplicated: Secondary | ICD-10-CM | POA: Diagnosis present

## 2023-09-21 DIAGNOSIS — Y906 Blood alcohol level of 120-199 mg/100 ml: Secondary | ICD-10-CM | POA: Diagnosis present

## 2023-09-21 DIAGNOSIS — I700 Atherosclerosis of aorta: Secondary | ICD-10-CM | POA: Diagnosis present

## 2023-09-21 DIAGNOSIS — G8929 Other chronic pain: Secondary | ICD-10-CM | POA: Diagnosis present

## 2023-09-21 DIAGNOSIS — F339 Major depressive disorder, recurrent, unspecified: Secondary | ICD-10-CM | POA: Diagnosis present

## 2023-09-21 DIAGNOSIS — I4891 Unspecified atrial fibrillation: Secondary | ICD-10-CM | POA: Diagnosis present

## 2023-09-21 DIAGNOSIS — J439 Emphysema, unspecified: Secondary | ICD-10-CM | POA: Diagnosis present

## 2023-09-21 DIAGNOSIS — Z794 Long term (current) use of insulin: Secondary | ICD-10-CM

## 2023-09-21 DIAGNOSIS — R739 Hyperglycemia, unspecified: Secondary | ICD-10-CM

## 2023-09-21 DIAGNOSIS — Z7985 Long-term (current) use of injectable non-insulin antidiabetic drugs: Secondary | ICD-10-CM

## 2023-09-21 DIAGNOSIS — T1491XA Suicide attempt, initial encounter: Secondary | ICD-10-CM | POA: Diagnosis not present

## 2023-09-21 DIAGNOSIS — Z818 Family history of other mental and behavioral disorders: Secondary | ICD-10-CM

## 2023-09-21 DIAGNOSIS — M545 Low back pain, unspecified: Secondary | ICD-10-CM | POA: Diagnosis present

## 2023-09-21 DIAGNOSIS — E878 Other disorders of electrolyte and fluid balance, not elsewhere classified: Secondary | ICD-10-CM | POA: Diagnosis present

## 2023-09-21 DIAGNOSIS — F17210 Nicotine dependence, cigarettes, uncomplicated: Secondary | ICD-10-CM | POA: Diagnosis present

## 2023-09-21 DIAGNOSIS — Z886 Allergy status to analgesic agent status: Secondary | ICD-10-CM

## 2023-09-21 DIAGNOSIS — Z6839 Body mass index (BMI) 39.0-39.9, adult: Secondary | ICD-10-CM

## 2023-09-21 DIAGNOSIS — T50902A Poisoning by unspecified drugs, medicaments and biological substances, intentional self-harm, initial encounter: Principal | ICD-10-CM

## 2023-09-21 DIAGNOSIS — Z7984 Long term (current) use of oral hypoglycemic drugs: Secondary | ICD-10-CM

## 2023-09-21 DIAGNOSIS — Z7951 Long term (current) use of inhaled steroids: Secondary | ICD-10-CM

## 2023-09-21 LAB — COMPREHENSIVE METABOLIC PANEL
ALT: 26 U/L (ref 0–44)
AST: 17 U/L (ref 15–41)
Albumin: 4.2 g/dL (ref 3.5–5.0)
Alkaline Phosphatase: 80 U/L (ref 38–126)
Anion gap: 15 (ref 5–15)
BUN: 15 mg/dL (ref 6–20)
CO2: 22 mmol/L (ref 22–32)
Calcium: 8.7 mg/dL — ABNORMAL LOW (ref 8.9–10.3)
Chloride: 96 mmol/L — ABNORMAL LOW (ref 98–111)
Creatinine, Ser: 0.66 mg/dL (ref 0.44–1.00)
GFR, Estimated: >60 mL/min (ref >60)
Glucose, Bld: 318 mg/dL — ABNORMAL HIGH (ref 70–99)
Potassium: 2.9 mmol/L — ABNORMAL LOW (ref 3.5–5.1)
Sodium: 133 mmol/L — ABNORMAL LOW (ref 135–145)
Total Bilirubin: 0.6 mg/dL (ref <1.2)
Total Protein: 7.7 g/dL (ref 6.5–8.1)

## 2023-09-21 LAB — CBC
HCT: 43.7 % (ref 36.0–46.0)
Hemoglobin: 15.1 g/dL — ABNORMAL HIGH (ref 12.0–15.0)
MCH: 33.2 pg (ref 26.0–34.0)
MCHC: 34.6 g/dL (ref 30.0–36.0)
MCV: 96 fL (ref 80.0–100.0)
Platelets: 189 10*3/uL (ref 150–400)
RBC: 4.55 MIL/uL (ref 3.87–5.11)
RDW: 11.3 % — ABNORMAL LOW (ref 11.5–15.5)
WBC: 6.8 10*3/uL (ref 4.0–10.5)
nRBC: 0 % (ref 0.0–0.2)

## 2023-09-21 LAB — HEMOGLOBIN A1C
Hgb A1c MFr Bld: 9 % — ABNORMAL HIGH (ref 4.8–5.6)
Mean Plasma Glucose: 211.6 mg/dL

## 2023-09-21 LAB — BASIC METABOLIC PANEL
Anion gap: 7 (ref 5–15)
BUN: 15 mg/dL (ref 6–20)
CO2: 24 mmol/L (ref 22–32)
Calcium: 8.2 mg/dL — ABNORMAL LOW (ref 8.9–10.3)
Chloride: 102 mmol/L (ref 98–111)
Creatinine, Ser: 0.72 mg/dL (ref 0.44–1.00)
GFR, Estimated: >60 mL/min (ref >60)
Glucose, Bld: 278 mg/dL — ABNORMAL HIGH (ref 70–99)
Potassium: 5.4 mmol/L — ABNORMAL HIGH (ref 3.5–5.1)
Sodium: 133 mmol/L — ABNORMAL LOW (ref 135–145)

## 2023-09-21 LAB — ACETAMINOPHEN LEVEL
Acetaminophen (Tylenol), Serum: <10 ug/mL — ABNORMAL LOW (ref 10–30)
Acetaminophen (Tylenol), Serum: <10 ug/mL — ABNORMAL LOW (ref 10–30)

## 2023-09-21 LAB — MAGNESIUM: Magnesium: 1.8 mg/dL (ref 1.7–2.4)

## 2023-09-21 LAB — SALICYLATE LEVEL
Salicylate Lvl: <7 mg/dL — ABNORMAL LOW (ref 7.0–30.0)
Salicylate Lvl: <7 mg/dL — ABNORMAL LOW (ref 7.0–30.0)

## 2023-09-21 LAB — CBG MONITORING, ED
Glucose-Capillary: 269 mg/dL — ABNORMAL HIGH (ref 70–99)
Glucose-Capillary: 188 mg/dL — ABNORMAL HIGH (ref 70–99)
Glucose-Capillary: 177 mg/dL — ABNORMAL HIGH (ref 70–99)
Glucose-Capillary: 262 mg/dL — ABNORMAL HIGH (ref 70–99)

## 2023-09-21 LAB — HIV ANTIBODY (ROUTINE TESTING W REFLEX): HIV Screen 4th Generation wRfx: NONREACTIVE

## 2023-09-21 LAB — POTASSIUM: Potassium: 3.8 mmol/L (ref 3.5–5.1)

## 2023-09-21 LAB — ETHANOL: Alcohol, Ethyl (B): 152 mg/dL — ABNORMAL HIGH (ref <10)

## 2023-09-21 MED ORDER — LORAZEPAM 2 MG/ML IJ SOLN: 1.0000 mg | INTRAMUSCULAR | Status: DC | PRN

## 2023-09-21 MED ORDER — ENOXAPARIN SODIUM 60 MG/0.6ML IJ SOSY
55.0000 mg | PREFILLED_SYRINGE | INTRAMUSCULAR | Status: DC
  Administered 2023-09-23: 55 mg via SUBCUTANEOUS
  Filled 2023-09-21 (×4): qty 0.6

## 2023-09-21 MED ORDER — POLYETHYLENE GLYCOL 3350 17 G PO PACK
17.0000 g | PACK | Freq: Every day | ORAL | Status: DC | PRN
  Administered 2023-09-23: 17 g via ORAL
  Filled 2023-09-21: qty 1

## 2023-09-21 MED ORDER — FLUTICASONE PROPIONATE 50 MCG/ACT NA SUSP: 2.0000 | Freq: Every day | NASAL | Status: DC | PRN

## 2023-09-21 MED ORDER — ACETAMINOPHEN 325 MG PO TABS
650.0000 mg | ORAL_TABLET | Freq: Four times a day (QID) | ORAL | Status: DC | PRN
  Administered 2023-09-22 – 2023-09-23 (×2): 650 mg via ORAL
  Filled 2023-09-21 (×2): qty 2

## 2023-09-21 MED ORDER — DILTIAZEM HCL-DEXTROSE 125-5 MG/125ML-% IV SOLN (PREMIX)
5.0000 mg/h | INTRAVENOUS | Status: DC
  Filled 2023-09-21: qty 125

## 2023-09-21 MED ORDER — ONDANSETRON HCL 4 MG/2ML IJ SOLN
4.0000 mg | Freq: Once | INTRAMUSCULAR | Status: AC
  Administered 2023-09-21: 4 mg via INTRAVENOUS
  Filled 2023-09-21: qty 2

## 2023-09-21 MED ORDER — POTASSIUM CHLORIDE 20 MEQ PO PACK
40.0000 meq | PACK | Freq: Once | ORAL | Status: AC
  Administered 2023-09-21: 40 meq via ORAL
  Filled 2023-09-21: qty 2

## 2023-09-21 MED ORDER — MAGNESIUM SULFATE 2 GM/50ML IV SOLN
2.0000 g | Freq: Once | INTRAVENOUS | Status: AC
  Administered 2023-09-21: 2 g via INTRAVENOUS
  Filled 2023-09-21: qty 50

## 2023-09-21 MED ORDER — ONDANSETRON HCL 4 MG/2ML IJ SOLN: 4.0000 mg | Freq: Four times a day (QID) | INTRAMUSCULAR | Status: DC | PRN

## 2023-09-21 MED ORDER — INSULIN GLARGINE-YFGN 100 UNIT/ML ~~LOC~~ SOLN
16.0000 [IU] | Freq: Every day | SUBCUTANEOUS | Status: DC
  Administered 2023-09-21 – 2023-09-23 (×3): 16 [IU] via SUBCUTANEOUS
  Filled 2023-09-21 (×4): qty 0.16

## 2023-09-21 MED ORDER — MAGNESIUM HYDROXIDE 400 MG/5ML PO SUSP: 30.0000 mL | Freq: Every day | ORAL | Status: DC | PRN

## 2023-09-21 MED ORDER — NICOTINE 14 MG/24HR TD PT24
14.0000 mg | MEDICATED_PATCH | Freq: Every day | TRANSDERMAL | Status: DC
  Administered 2023-09-21 – 2023-09-24 (×4): 14 mg via TRANSDERMAL
  Filled 2023-09-21 (×4): qty 1

## 2023-09-21 MED ORDER — ALPRAZOLAM 0.5 MG PO TABS
0.2500 mg | ORAL_TABLET | Freq: Two times a day (BID) | ORAL | Status: DC
  Administered 2023-09-21 – 2023-09-24 (×6): 0.25 mg via ORAL
  Filled 2023-09-21 (×6): qty 1

## 2023-09-21 MED ORDER — POTASSIUM CHLORIDE CRYS ER 20 MEQ PO TBCR
40.0000 meq | EXTENDED_RELEASE_TABLET | Freq: Once | ORAL | Status: AC
  Administered 2023-09-21: 40 meq via ORAL
  Filled 2023-09-21: qty 2

## 2023-09-21 MED ORDER — ALBUTEROL SULFATE HFA 108 (90 BASE) MCG/ACT IN AERS: 1.0000 | INHALATION_SPRAY | Freq: Four times a day (QID) | RESPIRATORY_TRACT | Status: DC | PRN

## 2023-09-21 MED ORDER — SODIUM CHLORIDE 0.9 % IV SOLN: INTRAVENOUS | Status: DC

## 2023-09-21 MED ORDER — LORATADINE 10 MG PO TABS
10.0000 mg | ORAL_TABLET | Freq: Every day | ORAL | Status: DC
  Administered 2023-09-22 – 2023-09-24 (×3): 10 mg via ORAL
  Filled 2023-09-21 (×4): qty 1

## 2023-09-21 MED ORDER — SODIUM CHLORIDE 0.9 % IV BOLUS
1000.0000 mL | Freq: Once | INTRAVENOUS | Status: AC
  Administered 2023-09-21: 1000 mL via INTRAVENOUS

## 2023-09-21 MED ORDER — INSULIN ASPART 100 UNIT/ML IJ SOLN
5.0000 [IU] | Freq: Three times a day (TID) | INTRAMUSCULAR | Status: DC
  Administered 2023-09-21 – 2023-09-24 (×8): 5 [IU] via SUBCUTANEOUS
  Filled 2023-09-21 (×7): qty 1

## 2023-09-21 MED ORDER — ONDANSETRON HCL 4 MG PO TABS: 4.0000 mg | ORAL_TABLET | Freq: Four times a day (QID) | ORAL | Status: DC | PRN

## 2023-09-21 MED ORDER — INSULIN ASPART 100 UNIT/ML IJ SOLN
0.0000 [IU] | Freq: Three times a day (TID) | INTRAMUSCULAR | Status: DC
  Administered 2023-09-21: 11 [IU] via SUBCUTANEOUS
  Filled 2023-09-21: qty 1

## 2023-09-21 MED ORDER — INSULIN DETEMIR 100 UNIT/ML ~~LOC~~ SOLN
20.0000 [IU] | Freq: Every day | SUBCUTANEOUS | Status: DC
  Filled 2023-09-21: qty 0.2

## 2023-09-21 MED ORDER — ACETAMINOPHEN 325 MG RE SUPP: 650.0000 mg | Freq: Four times a day (QID) | RECTAL | Status: DC | PRN

## 2023-09-21 MED ORDER — GABAPENTIN 300 MG PO CAPS
300.0000 mg | ORAL_CAPSULE | Freq: Three times a day (TID) | ORAL | Status: DC
  Administered 2023-09-21 – 2023-09-24 (×9): 300 mg via ORAL
  Filled 2023-09-21 (×9): qty 1

## 2023-09-21 MED ORDER — TRAZODONE HCL 100 MG PO TABS
300.0000 mg | ORAL_TABLET | Freq: Every day | ORAL | Status: DC
  Administered 2023-09-21 – 2023-09-22 (×2): 300 mg via ORAL
  Filled 2023-09-21 (×3): qty 3

## 2023-09-21 MED ORDER — ALBUTEROL SULFATE (2.5 MG/3ML) 0.083% IN NEBU: 2.5000 mg | INHALATION_SOLUTION | Freq: Four times a day (QID) | RESPIRATORY_TRACT | Status: DC | PRN

## 2023-09-21 MED ORDER — BUPROPION HCL ER (XL) 150 MG PO TB24
150.0000 mg | ORAL_TABLET | Freq: Every day | ORAL | Status: DC
  Administered 2023-09-21 – 2023-09-24 (×4): 150 mg via ORAL
  Filled 2023-09-21 (×4): qty 1

## 2023-09-21 MED ORDER — INSULIN ASPART 100 UNIT/ML IJ SOLN
0.0000 [IU] | Freq: Three times a day (TID) | INTRAMUSCULAR | Status: DC
  Administered 2023-09-21 (×2): 3 [IU] via SUBCUTANEOUS
  Administered 2023-09-22 (×2): 2 [IU] via SUBCUTANEOUS
  Administered 2023-09-22 – 2023-09-23 (×2): 5 [IU] via SUBCUTANEOUS
  Administered 2023-09-23 – 2023-09-24 (×2): 3 [IU] via SUBCUTANEOUS
  Filled 2023-09-21 (×8): qty 1

## 2023-09-21 MED ORDER — INSULIN ASPART 100 UNIT/ML IJ SOLN
0.0000 [IU] | Freq: Every day | INTRAMUSCULAR | Status: DC
  Administered 2023-09-21: 3 [IU] via SUBCUTANEOUS
  Filled 2023-09-21: qty 1

## 2023-09-21 MED ORDER — DILTIAZEM HCL 25 MG/5ML IV SOLN
15.0000 mg | Freq: Once | INTRAVENOUS | Status: AC
  Administered 2023-09-21: 15 mg via INTRAVENOUS
  Filled 2023-09-21: qty 5

## 2023-09-21 MED ORDER — POTASSIUM CHLORIDE 20 MEQ PO PACK: 40.0000 meq | PACK | Freq: Once | ORAL | Status: DC

## 2023-09-21 MED ORDER — TRAZODONE HCL 50 MG PO TABS: 25.0000 mg | ORAL_TABLET | Freq: Every evening | ORAL | Status: DC | PRN

## 2023-09-21 NOTE — Progress Notes (Signed)
PHARMACIST - PHYSICIAN COMMUNICATION  CONCERNING:  Enoxaparin (Lovenox) for DVT Prophylaxis    RECOMMENDATION: Patient was prescribed enoxaprin 40mg  q24 hours for VTE prophylaxis.   Filed Weights   09/21/23 0038  Weight: 115 kg (253 lb 8.5 oz)    Body mass index is 39.71 kg/m.  Estimated Creatinine Clearance: 100.4 mL/min (by C-G formula based on SCr of 0.66 mg/dL).   Based on [facility] policy patient is candidate for enoxaparin 0.5mg /kg TBW SQ every 24 hours based on BMI being >30.  DESCRIPTION: Pharmacy has adjusted enoxaparin dose per [facility] policy.  Patient is now receiving enoxaparin 0.5 mg/kg every 24 hours   Otelia Sergeant, PharmD, [facility] 09/21/2023 5:13 AM

## 2023-09-21 NOTE — Assessment & Plan Note (Signed)
-   This was through trazodone overdose taking about 20 tablets of 25 mg as well as excessive alcohol intake. - Psychiatry consult will be obtained. - The patient had IVC in the ER.

## 2023-09-21 NOTE — ED Notes (Signed)
IVC PENDING  CONSULT ?

## 2023-09-21 NOTE — Assessment & Plan Note (Signed)
-   The patient is on Xanax.

## 2023-09-21 NOTE — Assessment & Plan Note (Addendum)
-   The patient will be admitted to a progressive unit bed. - We will continue IV Cardizem drip. - 2D echo and cardiology consult to be obtained. - I notified Dr. Juliann Pares about the patient. - This could certainly be related to trazodone overdose.

## 2023-09-21 NOTE — Progress Notes (Signed)
This is a nonbillable note, please see the history and physical for details. Patient is seen and examined.  Currently has no complaint.  I personally reviewed patient chest x-ray performed at 5 AM, QT interval was normal.  Per telemetry, atrial fibrillation has converted to sinus. Cardiology consult is pending. Consult psychiatry for suicide attempt. Potassium has increased to 5.4 after giving treatment for hypokalemia.  Slightly higher potassium probably from a lab error, repeat lab by 4pm. Currently, the effect of trazodone should be over.  Will continue hold patient until psychiatry see the patient.  She may need admission to psychiatry.

## 2023-09-21 NOTE — Assessment & Plan Note (Signed)
Management per psychiatry 

## 2023-09-21 NOTE — Assessment & Plan Note (Signed)
-   Aggressive potassium replacement will be provided and optimization of magnesium level as well.

## 2023-09-21 NOTE — Consult Note (Addendum)
[facility] CLINIC CARDIOLOGY CONSULT NOTE       Patient ID: Christy Mayer MRN: [id number] DOB/AGE: [birth year] 57 y.o.  Admit date: 09/21/2023 Referring Physician Dr. Valente David Primary Physician Althea Charon, Netta Neat, DO  Primary Cardiologist None Reason for Consultation AF RVR  HPI: Christy Mayer is a 57 y.o. female  with a past medical history of poorly controlled type II diabetes, COPD, mitral valve prolapse, asthma who presented to the ED on 09/21/2023 for intentional overdose. She took 20 tablets of trazodone and drank alcohol, began having nausea/vomiting and called EMS. Found to be in atrial fibrillation RVR in the ED. Cardiology was consulted for further evaluation.   Patient got into an argument with her significant other yesterday evening and took 28 trazodone 150 mg tablets and drink 6 beers and an intentional suicide attempt.  Shortly thereafter she became very nauseated and started vomiting.  EMS was called and she was brought to the ED for further evaluation.  She was found to be in atrial fibrillation RVR on EKG in the ED.  Further workup in the ED revealed creatinine 0.66, potassium 2.9, sodium 133, calcium 8.7, hemoglobin 15.1, WBC 6.8.  EtOH 152.  She was started on IV fluids and received 50 mg IV diltiazem.  She was also given p.o. Zofran and potassium supplementation.  Diltiazem infusion was ordered however patient converted to normal sinus rhythm and did not require initiation of this.  She has remained in normal sinus rhythm since early this morning.  At the time of my evaluation this morning patient is resting comfortably in ED stretcher.  She works that she occasionally has episodes of palpitations but this time she felt the episode lasted longer.  Otherwise it felt similar to prior episodes.  She also reports associated headache.  States that she has chronic shortness of breath and feels that this is stable.  She denies any episodes of chest pain.  Overall she reports  if she feels better at this time and denies any recurrence of palpitations since early this morning.  Review of systems complete and found to be negative unless listed above    Past Medical History:  Diagnosis Date   Arthritis    Asthma    Back pain    COPD (chronic obstructive pulmonary disease) (HCC)    Emphysema lung (HCC)    Heart palpitations    Miscarriage    Mitral valve prolapse     Past Surgical History:  Procedure Laterality Date   CESAREAN SECTION  11/2007    (Not in a hospital admission)  Social History   Socioeconomic History   Marital status: Married    Spouse name: Not on file   Number of children: Not on file   Years of education: Not on file   Highest education level: Not on file  Occupational History   Occupation: work from home  Tobacco Use   Smoking status: Every Day    Current packs/day: 0.50    Average packs/day: 0.5 packs/day for 30.0 years (15.0 ttl pk-yrs)    Types: Cigarettes   Smokeless tobacco: Never  Vaping Use   Vaping status: Never Used  Substance and Sexual Activity   Alcohol use: Yes    Alcohol/week: 0.0 standard drinks of alcohol    Comment: occasionally    Drug use: No   Sexual activity: Yes    Birth control/protection: None  Other Topics Concern   Not on file  Social History Narrative   Not on file  Social Drivers of Corporate investment banker Strain: Not on file  Food Insecurity: Not on file  Transportation Needs: Not on file  Physical Activity: Not on file  Stress: Not on file  Social Connections: Not on file  Intimate Partner Violence: Not on file    Family History  Adopted: Yes     Vitals:   09/21/23 0443 09/21/23 0500 09/21/23 0803 09/21/23 1000  BP:  (!) 133/95 139/69   Pulse:  64 73   Resp:  (!) 24 18   Temp: 97.8 F (36.6 C)   98.1 F (36.7 C)  TempSrc: Oral   Oral  SpO2:  100% 95%   Weight:      Height:        PHYSICAL EXAM General: Well appearing female, well nourished, in no acute  distress. HEENT: Normocephalic and atraumatic. Neck: No JVD.  Lungs: Normal respiratory effort on room air. Clear bilaterally to auscultation. No wheezes, crackles, rhonchi.  Heart: HRRR. Normal S1 and S2 without gallops or murmurs.  Abdomen: Non-distended appearing.  Msk: Normal strength and tone for age. Extremities: Warm and well perfused. No clubbing, cyanosis. No edema.  Neuro: Alert and oriented X 3. Psych: Answers questions appropriately.   Labs: Basic Metabolic Panel: Recent Labs    09/21/23 0053 09/21/23 1026  NA 133* 133*  K 2.9* 5.4*  CL 96* 102  CO2 22 24  GLUCOSE 318* 278*  BUN 15 15  CREATININE 0.66 0.72  CALCIUM 8.7* 8.2*  MG  --  1.8   Liver Function Tests: Recent Labs    09/21/23 0053  AST 17  ALT 26  ALKPHOS 80  BILITOT 0.6  PROT 7.7  ALBUMIN 4.2   No results for input(s): "LIPASE", "AMYLASE" in the last 72 hours. CBC: Recent Labs    09/21/23 0053  WBC 6.8  HGB 15.1*  HCT 43.7  MCV 96.0  PLT 189   Cardiac Enzymes: No results for input(s): "CKTOTAL", "CKMB", "CKMBINDEX", "TROPONINIHS" in the last 72 hours. BNP: No results for input(s): "BNP" in the last 72 hours. D-Dimer: No results for input(s): "DDIMER" in the last 72 hours. Hemoglobin A1C: Recent Labs    09/21/23 0610  HGBA1C 9.0*   Fasting Lipid Panel: No results for input(s): "CHOL", "HDL", "LDLCALC", "TRIG", "CHOLHDL", "LDLDIRECT" in the last 72 hours. Thyroid Function Tests: No results for input(s): "TSH", "T4TOTAL", "T3FREE", "THYROIDAB" in the last 72 hours.  Invalid input(s): "FREET3" Anemia Panel: No results for input(s): "VITAMINB12", "FOLATE", "FERRITIN", "TIBC", "IRON", "RETICCTPCT" in the last 72 hours.   Radiology: [facility] [place] Chest Port 1 View Result Date: 09/21/2023 CLINICAL DATA:  Recent EXAM: PORTABLE CHEST 1 VIEW COMPARISON:  02/05/2022 FINDINGS: The heart size and mediastinal contours are within normal limits. Both lungs are clear. The visualized skeletal structures  are unremarkable. IMPRESSION: No active disease. Electronically Signed   By: Alcide Clever M.D.   On: 09/21/2023 00:49    ECHO ordered  TELEMETRY reviewed by me 09/21/2023: not on tele  EKG reviewed by me: atrial fibrillation RVR rate 127 bpm  Data reviewed by me 09/21/2023: last 24h vitals tele labs imaging I/O ED provider note, admission H&P  Principal Problem:   Atrial fibrillation with RVR (HCC) Active Problems:   Major depression, recurrent, chronic (HCC)   Uncontrolled type 2 diabetes mellitus with hyperglycemia, with long-term current use of insulin (HCC)   Hyponatremia   Drug overdose, intentional, initial encounter (HCC)   Hypokalemia   Suicidal behavior with attempted self-injury (HCC)  Anxiety    ASSESSMENT AND PLAN:  Christy Mayer is a 57 y.o. female  with a past medical history of poorly controlled type II diabetes, COPD, mitral valve prolapse, asthma who presented to the ED on 09/21/2023 for intentional overdose. She took 20 tablets of trazodone and drank alcohol, began having nausea/vomiting and called EMS. Found to be in atrial fibrillation RVR in the ED. Cardiology was consulted for further evaluation.   # Atrial fibrillation RVR # New onset atrial fibrillation Patient patient with new onset atrial fibrillation RVR in the setting of overdose attempt with trazodone and alcohol.  Converted to normal sinus rhythm with IV fluids and IV diltiazem 15 mg.  Reports history of frequent palpitation episodes.  CHA2DS2-VASc score 3 (female, type 2 diabetes, aortic atherosclerosis by CT scan) -Defer starting any rate control/antiarrhythmics as patient now in NSR and episode likely precipitated by alcohol consumption and trazodone.  -Defer starting anticoagulation given episode likely 2/2 alcohol consumption and trazodone. -Can consider holter monitor placement prior to DC for additional evaluation.  -Monitor and replenish electrolytes for a goal K >4, Mag >2    This patient's  plan of care was discussed and created with Dr. Juliann Pares and he is in agreement.  Signed: Gale Journey, PA-C  09/21/2023, 11:13 AM [facility]

## 2023-09-21 NOTE — ED Notes (Signed)
Lunch tray given to pt.

## 2023-09-21 NOTE — H&P (Signed)
PATIENT NAME: Christy Mayer    MR#:  [id number]  DATE OF BIRTH:  April 13, 1966  DATE OF ADMISSION:  09/21/2023  PRIMARY CARE PHYSICIAN: Smitty Cords, DO   Patient is coming from: Home  REQUESTING/REFERRING PHYSICIAN: Chiquita Loth, MD  CHIEF COMPLAINT:   Chief Complaint  Patient presents with   Drug Overdose   Suicide Attempt    HISTORY OF PRESENT ILLNESS:  Christy Mayer is a 57 y.o. Caucasian female with medical history significant for COPD, mitral valve prolapse, asthma and osteoarthritis, presented to the emergency room with acute onset of overdose with trazodone together with alcohol.  She stated that she took about 20 tablets of 25 mg p.o. trazodone and drank about 6 cans of beer.  She admitted to nausea and vomiting.  She has been having dyspnea and palpitations but denied any chest pain.  She stated that she has been under increased stress tonight.  No fever or chills.  No dysuria, oliguria or hematuria or flank pain.  No headache or dizziness or blurred vision.  ED Course: When the patient came to the ER, heart rate was 134 and otherwise vital signs were within normal.  Labs revealed hyponatremia and hypochloremia with potassium of 2.9 and blood glucose of 318 with calcium of 8.7 and otherwise unremarkable CMP.  CBC showed hemoconcentration.  Tylenol level was less than 10 and salicylate less than 7. EKG as reviewed by me : EKG showed atrial fibrillation with rapid ventricular response of 127. Imaging: Portable chest x-ray showed no acute cardiopulmonary disease.  The patient was given 15 mg of IV Cardizem followed by IV Cardizem drip, 4 mg of IV Zofran, 40 mill Cabbell p.o. potassium chloride and 2 L bolus of IV normal saline.  She converted to normal sinus rhythm.  She will be admitted to a progressive unit bed for further evaluation and management. PAST MEDICAL HISTORY:   Past Medical History:  Diagnosis Date   Arthritis    Asthma     Back pain    COPD (chronic obstructive pulmonary disease) (HCC)    Emphysema lung (HCC)    Heart palpitations    Miscarriage    Mitral valve prolapse     PAST SURGICAL HISTORY:   Past Surgical History:  Procedure Laterality Date   CESAREAN SECTION  11/2007    SOCIAL HISTORY:   Social History   Tobacco Use   Smoking status: Every Day    Current packs/day: 0.50    Average packs/day: 0.5 packs/day for 30.0 years (15.0 ttl pk-yrs)    Types: Cigarettes   Smokeless tobacco: Never  Substance Use Topics   Alcohol use: Yes    Alcohol/week: 0.0 standard drinks of alcohol    Comment: occasionally     FAMILY HISTORY:   Family History  Adopted: Yes    DRUG ALLERGIES:   Allergies  Allergen Reactions   Aspirin Nausea Only    REVIEW OF SYSTEMS:   ROS As per history of present illness. All pertinent systems were reviewed above. Constitutional, HEENT, cardiovascular, respiratory, GI, GU, musculoskeletal, neuro, psychiatric, endocrine, integumentary and hematologic systems were reviewed and are otherwise negative/unremarkable except for positive findings mentioned above in the HPI.   MEDICATIONS AT HOME:   Prior to Admission medications   Medication Sig Start Date End Date Taking? Authorizing Provider  ACCU-CHEK GUIDE test strip Use to check blood sugar up to 2 times daily 02/10/22   Smitty Cords, DO  Accu-Chek Softclix Lancets lancets Check sugar up to 2 times daily 02/10/22   Smitty Cords, DO  Alcohol Swabs PADS 1 Dose by Does not apply route 3 (three) times daily before meals. 02/08/22   Alford Highland, MD  ALPRAZolam Prudy Feeler) 0.25 MG tablet TAKE 1 TABLET(0.25 MG) BY MOUTH TWICE DAILY 01/02/23   Althea Charon, Netta Neat, DO  Blood Glucose Calibration (ACCU-CHEK GUIDE CONTROL) LIQD Use for glucometer 02/10/22   Karamalegos, Netta Neat, DO  Blood Glucose Monitoring Suppl (ACCU-CHEK GUIDE) w/Device KIT Use to check blood sugar up to 2 x daily 02/10/22    Althea Charon, Netta Neat, DO  budesonide-formoterol (SYMBICORT) 160-4.5 MCG/ACT inhaler Inhale 2 puffs into the lungs daily. Need future visit for refills 03/10/22   Smitty Cords, DO  buPROPion (WELLBUTRIN XL) 150 MG 24 hr tablet Take 1 tablet (150 mg total) by mouth daily. 01/02/23   Karamalegos, Netta Neat, DO  fluticasone (FLONASE) 50 MCG/ACT nasal spray Place 2 sprays into both nostrils daily. Use for 4-6 weeks then stop and use seasonally or as needed. 02/23/21   Karamalegos, Netta Neat, DO  gabapentin (NEURONTIN) 300 MG capsule TAKE 1 CAPSULE(300 MG) BY MOUTH THREE TIMES DAILY 10/18/22   Althea Charon, Netta Neat, DO  insulin aspart (FIASP) 100 UNIT/ML FlexTouch Pen 6 units prior to meals (okay to switch to any short acting insulin that is covered) 02/08/22   Alford Highland, MD  insulin detemir (LEVEMIR) 100 UNIT/ML FlexPen 20 units subcutaneous injection (okay to switch to any long acting insulin that is covered) 02/08/22   Alford Highland, MD  Insulin Pen Needle 33G X 5 MM MISC 1 Dose by Does not apply route 3 (three) times daily before meals. 02/08/22   Alford Highland, MD  loratadine (CLARITIN) 10 MG tablet Take 1 tablet (10 mg total) by mouth daily. 03/10/22   Karamalegos, Netta Neat, DO  metFORMIN (GLUCOPHAGE) 500 MG tablet Take 2 tablets (1,000 mg total) by mouth 2 (two) times daily with a meal. 02/15/22   Karamalegos, Netta Neat, DO  nicotine (NICODERM CQ - DOSED IN MG/24 HOURS) 14 mg/24hr patch One patch chest wall daily (substitute generic) 02/08/22   Alford Highland, MD  ondansetron (ZOFRAN ODT) 4 MG disintegrating tablet Take 1 tablet (4 mg total) by mouth every 8 (eight) hours as needed for nausea or vomiting. 02/24/21   Karamalegos, Netta Neat, DO  polyethylene glycol (MIRALAX / GLYCOLAX) 17 g packet Take 17 g by mouth daily as needed for moderate constipation. 02/08/22   Alford Highland, MD  traZODone (DESYREL) 50 MG tablet Take 6 tablets (300 mg total) by mouth at bedtime. 03/08/22    Karamalegos, Alexander J, DO  TRULICITY 0.75 MG/0.5ML SOPN Inject 0.75 mg into the skin once a week. 03/11/22   Karamalegos, Alexander J, DO  UBRELVY 50 MG TABS Take one tab 50mg  for migraine headache, may repeat dose 50mg  within 2 hours of initial dose only. Max dose in 24 hours is 100mg  or 2 tabs 03/11/22   Karamalegos, Netta Neat, DO  VENTOLIN HFA 108 (90 Base) MCG/ACT inhaler Inhale 1-2 puffs into the lungs every 6 (six) hours as needed for wheezing or shortness of breath. 03/10/22   Karamalegos, Netta Neat, DO      VITAL SIGNS:  Blood pressure 139/69, pulse 73, temperature 98.1 F (36.7 C), temperature source Oral, resp. rate 18, height 5\' 7"  (1.702 m), weight 115 kg, last menstrual period 08/11/2014, SpO2 95%.  PHYSICAL EXAMINATION:  Physical Exam  GENERAL:  58 y.o.-year-old patient  lying in the bed with no acute distress.  EYES: Pupils equal, round, reactive to light and accommodation. No scleral icterus. Extraocular muscles intact.  HEENT: Head atraumatic, normocephalic. Oropharynx and nasopharynx clear.  NECK:  Supple, no jugular venous distention. No thyroid enlargement, no tenderness.  LUNGS: Normal breath sounds bilaterally, no wheezing, rales,rhonchi or crepitation. No use of accessory muscles of respiration.  CARDIOVASCULAR: Regular rate and rhythm, S1, S2 normal. No murmurs, rubs, or gallops.  ABDOMEN: Soft, nondistended, nontender. Bowel sounds present. No organomegaly or mass.  EXTREMITIES: No pedal edema, cyanosis, or clubbing.  NEUROLOGIC: Cranial nerves II through XII are intact. Muscle strength 5/5 in all extremities. Sensation intact. Gait not checked.  PSYCHIATRIC: The patient is alert and oriented x 3.  Normal affect and good eye contact. SKIN: No obvious rash, lesion, or ulcer.   LABORATORY PANEL:   CBC Recent Labs  Lab 09/21/23 0053  WBC 6.8  HGB 15.1*  HCT 43.7  PLT 189    ------------------------------------------------------------------------------------------------------------------  Chemistries  Recent Labs  Lab 09/21/23 0053 09/21/23 1026  NA 133* 133*  K 2.9* 5.4*  CL 96* 102  CO2 22 24  GLUCOSE 318* 278*  BUN 15 15  CREATININE 0.66 0.72  CALCIUM 8.7* 8.2*  MG  --  1.8  AST 17  --   ALT 26  --   ALKPHOS 80  --   BILITOT 0.6  --    ------------------------------------------------------------------------------------------------------------------  Cardiac Enzymes No results for input(s): "TROPONINI" in the last 168 hours. ------------------------------------------------------------------------------------------------------------------  RADIOLOGY:  DG Chest Port 1 View Result Date: 09/21/2023 CLINICAL DATA:  Recent EXAM: PORTABLE CHEST 1 VIEW COMPARISON:  02/05/2022 FINDINGS: The heart size and mediastinal contours are within normal limits. Both lungs are clear. The visualized skeletal structures are unremarkable. IMPRESSION: No active disease. Electronically Signed   By: Alcide Clever M.D.   On: 09/21/2023 00:49      IMPRESSION AND PLAN:  Assessment and Plan: * Atrial fibrillation with RVR (HCC) - The patient will be admitted to a progressive unit bed. - We will continue IV Cardizem drip. - 2D echo and cardiology consult to be obtained. - I notified Dr. Juliann Pares about the patient. - This could certainly be related to trazodone overdose.  Suicidal behavior with attempted self-injury [facility]) - This was through trazodone overdose taking about 20 tablets of 25 mg as well as excessive alcohol intake. - Psychiatry consult will be obtained. - The patient had IVC in the ER.  Hypokalemia - Aggressive potassium replacement will be provided and optimization of magnesium level as well.  Uncontrolled type 2 diabetes mellitus with hyperglycemia, with long-term current use of insulin (HCC) - The patient will be placed on supplemental coverage  with NovoLog. - We will continue basal coverage.  Anxiety - The patient is on Xanax.  Major depression, recurrent, chronic (HCC) - Management per psychiatry.    DVT prophylaxis: Lovenox.  Advanced Care Planning:  Code Status: full code.  Family Communication:  The plan of care was discussed in details with the patient (and family). I answered all questions. The patient agreed to proceed with the above mentioned plan. Further management will depend upon hospital course. Disposition Plan: Back to previous home environment Consults called: Psychiatry and cardiology All the records are reviewed and case discussed with ED provider.  Status is: Inpatient   At the time of the admission, it appears that the appropriate admission status for this patient is inpatient.  This is judged to be reasonable and  necessary in order to provide the required intensity of service to ensure the patient's safety given the presenting symptoms, physical exam findings and initial radiographic and laboratory data in the context of comorbid conditions.  The patient requires inpatient status due to high intensity of service, high risk of further deterioration and high frequency of surveillance required.  I certify that at the time of admission, it is my clinical judgment that the patient will require inpatient hospital care extending more than 2 midnights.                            Dispo: The patient is from: Home              Anticipated d/c is to: Home              Patient currently is not medically stable to d/c.              Difficult to place patient: No  Hannah Beat M.D on 09/21/2023 at 11:06 AM  [facility]   From 7 PM-7 AM, contact night-coverage [email or web address]  CC: Primary care physician; Smitty Cords, DO

## 2023-09-21 NOTE — ED Triage Notes (Signed)
Pt arrived via [facility] from home post intentional overdose on approx 20 Trazodone 150mg  after argument with spouse. Per pt and EMS, medication taken approx 1 hour prior to arrival to ED. Pt admits to copious amount of alcohol ingested as well. EMS reports pt actively vomiting prior to their arrival on scene and post arrival prior to their departure. Pt arrives A&O x4 with strong odor of emesis and ETOH.    Pt with hx/o AFib and DM.

## 2023-09-21 NOTE — ED Provider Notes (Signed)
-----------------------------------------   2:08 AM on 09/21/2023 -----------------------------------------   Patient seen during Epic downtime; please see paper chart for H&P.  IV Cardizem ordered.  Will replete potassium orally.  Will continue to monitor and care for patient.   ----------------------------------------- 2:54 AM on 09/21/2023 -----------------------------------------   HR 90s, afib s/p Cardizem   ----------------------------------------- 3:41 AM on 09/21/2023 -----------------------------------------   Poison Control recommends repeat EKG and toxicology levels at 6 AM.  Administer IV fluids for soft blood pressure.   ----------------------------------------- 4:40 AM on 09/21/2023 -----------------------------------------   Heart rate back up to 138, atrial fibrillation.  Blood pressure improved.  Placed on Cardizem drip and consult hospitalist services for evaluation and admission.   ----------------------------------------- 5:22 AM on 09/21/2023 -----------------------------------------   Patient ambulated to restroom, NSR noted on her return to bed. ED ECG REPORT I, Maysen Bonsignore J, the attending physician, personally viewed and interpreted this ECG.   Date: 09/21/2023  EKG Time: 0519  Rate: 80  Rhythm: normal sinus rhythm  Axis: Normal  Intervals:none  ST&T Change: Nonspecific   CRITICAL CARE Performed by: Irean Hong   Total critical care time: 45 minutes  Critical care time was exclusive of separately billable procedures and treating other patients.  Critical care was necessary to treat or prevent imminent or life-threatening deterioration.  Critical care was time spent personally by me on the following activities: development of treatment plan with patient and/or surrogate as well as nursing, discussions with consultants, evaluation of patient's response to treatment, examination of patient, obtaining history from patient or surrogate, ordering  and performing treatments and interventions, ordering and review of laboratory studies, ordering and review of radiographic studies, pulse oximetry and re-evaluation of patient's condition.    Irean Hong, MD 09/22/23 [phone]

## 2023-09-21 NOTE — ED Notes (Signed)
Pt reclining on stretcher in nad and remains in SR at rate of  74. Richard, RN resuming patient care.

## 2023-09-21 NOTE — Assessment & Plan Note (Signed)
-   The patient will be placed on supplemental coverage with NovoLog. - We will continue basal coverage. 

## 2023-09-21 NOTE — Assessment & Plan Note (Signed)
-   The patient will be placed on supplemental coverage with NovoLog. - We will continue basal coverage. - We will continue Neurontin. 

## 2023-09-21 NOTE — ED Notes (Signed)
Pt was dressed out in to hospital scrubs by Clinical research associate and RN Richard. Pt calm and cooperative at time of dress out. All belongings were place in patients belongings bag and labeled with pt label. Belongings are:  [employer] Black pants [employer] and black cardigan Grey robe  Hair tie

## 2023-09-21 NOTE — ED Notes (Signed)
Poison control has been consulted.  They recommend observation until approximately 0600.  At that time repeat EKG and repeat tylenol level blood draw.  Patient may experience hypotension, which could require pressors.  Patient also at risk for seizures and QT prolongation.  If seizures occur, which they said should have already happened, treat them with benzos as needed.

## 2023-09-22 ENCOUNTER — Observation Stay
Admit: 2023-09-22 | Discharge: 2023-09-22 | Disposition: A | Discharge status: Home / Self Care | Payer: Medicaid Other | Attending: Student

## 2023-09-22 ENCOUNTER — Other Ambulatory Visit: Payer: Self-pay

## 2023-09-22 DIAGNOSIS — F17210 Nicotine dependence, cigarettes, uncomplicated: Secondary | ICD-10-CM | POA: Diagnosis present

## 2023-09-22 DIAGNOSIS — F332 Major depressive disorder, recurrent severe without psychotic features: Secondary | ICD-10-CM

## 2023-09-22 DIAGNOSIS — I341 Nonrheumatic mitral (valve) prolapse: Secondary | ICD-10-CM | POA: Diagnosis present

## 2023-09-22 DIAGNOSIS — Z7985 Long-term (current) use of injectable non-insulin antidiabetic drugs: Secondary | ICD-10-CM | POA: Diagnosis not present

## 2023-09-22 DIAGNOSIS — T43212A Poisoning by selective serotonin and norepinephrine reuptake inhibitors, intentional self-harm, initial encounter: Secondary | ICD-10-CM | POA: Diagnosis present

## 2023-09-22 DIAGNOSIS — M545 Low back pain, unspecified: Secondary | ICD-10-CM | POA: Diagnosis present

## 2023-09-22 DIAGNOSIS — Y906 Blood alcohol level of 120-199 mg/100 ml: Secondary | ICD-10-CM | POA: Diagnosis present

## 2023-09-22 DIAGNOSIS — F1090 Alcohol use, unspecified, uncomplicated: Secondary | ICD-10-CM | POA: Diagnosis present

## 2023-09-22 DIAGNOSIS — T50902A Poisoning by unspecified drugs, medicaments and biological substances, intentional self-harm, initial encounter: Secondary | ICD-10-CM

## 2023-09-22 DIAGNOSIS — G8929 Other chronic pain: Secondary | ICD-10-CM | POA: Diagnosis present

## 2023-09-22 DIAGNOSIS — E1165 Type 2 diabetes mellitus with hyperglycemia: Secondary | ICD-10-CM | POA: Diagnosis present

## 2023-09-22 DIAGNOSIS — I4891 Unspecified atrial fibrillation: Secondary | ICD-10-CM | POA: Diagnosis not present

## 2023-09-22 DIAGNOSIS — E878 Other disorders of electrolyte and fluid balance, not elsewhere classified: Secondary | ICD-10-CM | POA: Diagnosis present

## 2023-09-22 DIAGNOSIS — Z6839 Body mass index (BMI) 39.0-39.9, adult: Secondary | ICD-10-CM | POA: Diagnosis not present

## 2023-09-22 DIAGNOSIS — Z818 Family history of other mental and behavioral disorders: Secondary | ICD-10-CM | POA: Diagnosis not present

## 2023-09-22 DIAGNOSIS — I700 Atherosclerosis of aorta: Secondary | ICD-10-CM | POA: Diagnosis present

## 2023-09-22 DIAGNOSIS — J439 Emphysema, unspecified: Secondary | ICD-10-CM | POA: Diagnosis present

## 2023-09-22 DIAGNOSIS — E871 Hypo-osmolality and hyponatremia: Secondary | ICD-10-CM | POA: Diagnosis present

## 2023-09-22 DIAGNOSIS — T1491XA Suicide attempt, initial encounter: Secondary | ICD-10-CM | POA: Diagnosis not present

## 2023-09-22 DIAGNOSIS — E876 Hypokalemia: Secondary | ICD-10-CM | POA: Diagnosis present

## 2023-09-22 DIAGNOSIS — T50901A Poisoning by unspecified drugs, medicaments and biological substances, accidental (unintentional), initial encounter: Secondary | ICD-10-CM | POA: Diagnosis present

## 2023-09-22 DIAGNOSIS — Z886 Allergy status to analgesic agent status: Secondary | ICD-10-CM | POA: Diagnosis not present

## 2023-09-22 DIAGNOSIS — Z7984 Long term (current) use of oral hypoglycemic drugs: Secondary | ICD-10-CM | POA: Diagnosis not present

## 2023-09-22 DIAGNOSIS — Z79899 Other long term (current) drug therapy: Secondary | ICD-10-CM | POA: Diagnosis not present

## 2023-09-22 DIAGNOSIS — F419 Anxiety disorder, unspecified: Secondary | ICD-10-CM | POA: Diagnosis present

## 2023-09-22 DIAGNOSIS — Z794 Long term (current) use of insulin: Secondary | ICD-10-CM | POA: Diagnosis not present

## 2023-09-22 DIAGNOSIS — Z7951 Long term (current) use of inhaled steroids: Secondary | ICD-10-CM | POA: Diagnosis not present

## 2023-09-22 LAB — URINALYSIS, ROUTINE W REFLEX MICROSCOPIC
Bilirubin Urine: NEGATIVE
Glucose, UA: NEGATIVE mg/dL
Hgb urine dipstick: NEGATIVE
Ketones, ur: NEGATIVE mg/dL
Leukocytes,Ua: NEGATIVE
Nitrite: NEGATIVE
Protein, ur: NEGATIVE mg/dL
Specific Gravity, Urine: 1.01 (ref 1.005–1.030)
pH: 5 (ref 5.0–8.0)

## 2023-09-22 LAB — URINE DRUG SCREEN, QUALITATIVE (ARMC ONLY)
Amphetamines, Ur Screen: NOT DETECTED
Barbiturates, Ur Screen: NOT DETECTED
Benzodiazepine, Ur Scrn: NOT DETECTED
Cannabinoid 50 Ng, Ur ~~LOC~~: NOT DETECTED
Cocaine Metabolite,Ur ~~LOC~~: NOT DETECTED
MDMA (Ecstasy)Ur Screen: NOT DETECTED
Methadone Scn, Ur: NOT DETECTED
Opiate, Ur Screen: NOT DETECTED
Phencyclidine (PCP) Ur S: NOT DETECTED
Tricyclic, Ur Screen: NOT DETECTED

## 2023-09-22 LAB — ECHOCARDIOGRAM COMPLETE
AR max vel: 2.56 cm2
AV Area VTI: 2.93 cm2
AV Area mean vel: 2.52 cm2
AV Mean grad: 6 mm[Hg]
AV Peak grad: 12.8 mm[Hg]
Ao pk vel: 1.79 m/s
Area-P 1/2: 3.1 cm2
Calc EF: 62.3 %
Height: 67 in
MV VTI: 3.01 cm2
S' Lateral: 2.9 cm
Single Plane A2C EF: 59.1 %
Single Plane A4C EF: 66 %
Weight: 4056.46 [oz_av]

## 2023-09-22 LAB — CBG MONITORING, ED
Glucose-Capillary: 212 mg/dL — ABNORMAL HIGH (ref 70–99)
Glucose-Capillary: 122 mg/dL — ABNORMAL HIGH (ref 70–99)
Glucose-Capillary: 150 mg/dL — ABNORMAL HIGH (ref 70–99)
Glucose-Capillary: 191 mg/dL — ABNORMAL HIGH (ref 70–99)

## 2023-09-22 LAB — CBC
HCT: 38 % (ref 36.0–46.0)
Hemoglobin: 12.7 g/dL (ref 12.0–15.0)
MCH: 33.2 pg (ref 26.0–34.0)
MCHC: 33.4 g/dL (ref 30.0–36.0)
MCV: 99.5 fL (ref 80.0–100.0)
Platelets: 189 10*3/uL (ref 150–400)
RBC: 3.82 MIL/uL — ABNORMAL LOW (ref 3.87–5.11)
RDW: 11.8 % (ref 11.5–15.5)
WBC: 8.6 10*3/uL (ref 4.0–10.5)
nRBC: 0 % (ref 0.0–0.2)

## 2023-09-22 LAB — BASIC METABOLIC PANEL
Anion gap: 5 (ref 5–15)
BUN: 12 mg/dL (ref 6–20)
CO2: 25 mmol/L (ref 22–32)
Calcium: 7.9 mg/dL — ABNORMAL LOW (ref 8.9–10.3)
Chloride: 104 mmol/L (ref 98–111)
Creatinine, Ser: 0.65 mg/dL (ref 0.44–1.00)
GFR, Estimated: >60 mL/min (ref >60)
Glucose, Bld: 197 mg/dL — ABNORMAL HIGH (ref 70–99)
Potassium: 3.9 mmol/L (ref 3.5–5.1)
Sodium: 134 mmol/L — ABNORMAL LOW (ref 135–145)

## 2023-09-22 LAB — MAGNESIUM: Magnesium: 2.2 mg/dL (ref 1.7–2.4)

## 2023-09-22 MED ORDER — LIDOCAINE 5 % EX PTCH
1.0000 | MEDICATED_PATCH | CUTANEOUS | Status: DC
  Administered 2023-09-22 – 2023-09-23 (×2): 1 via TRANSDERMAL
  Filled 2023-09-22 (×2): qty 1

## 2023-09-22 NOTE — ED Notes (Signed)
Pt given breakfast tray and beverage.  

## 2023-09-22 NOTE — Progress Notes (Signed)
  Progress Note   Patient: Christy Mayer:[id number] DOB: 10/17/1965 DOA: 09/21/2023     1 DOS: the patient was seen and examined on 09/22/2023   Brief hospital course: Christy Mayer is a 57 y.o. Caucasian female with medical history significant for COPD, mitral valve prolapse, asthma and osteoarthritis, presented to the emergency room with acute onset of overdose with trazodone together with alcohol.  She was monitored overnight, condition has improved.  Pending psych evaluation.   Principal Problem:   Atrial fibrillation with RVR (HCC) Active Problems:   Suicidal behavior with attempted self-injury (HCC)   Uncontrolled type 2 diabetes mellitus with hyperglycemia, with long-term current use of insulin (HCC)   Hypokalemia   Major depression, recurrent, chronic (HCC)   Hyponatremia   Drug overdose, intentional, initial encounter (HCC)   Anxiety   Overdose   Assessment and Plan: New onset atrial fibrillation with RVR (HCC) This is in the setting of overdose with trazodone and alcohol.  Has been seen by cardiology, does not recommend anticoagulation. Patient has a, part of the sinus.  Suicidal behavior with attempted self-injury [facility]) Consult from psychiatry obtained yesterday, secure chat sent yesterday, responding from psychiatry confirmed.  But patient still not seen.  Hypokalemia Resolved.  Uncontrolled type 2 diabetes mellitus with hyperglycemia, with long-term current use of insulin (HCC) Continue current treatment.  Anxiety - The patient is on Xanax.  Major depression, recurrent, chronic (HCC) - Management per psychiatry.  Morbid obesity with BMI 39.71 with comorbidities. Diet exercise     Subjective:  Patient does not have any complaint today.  Physical Exam: Vitals:   09/22/23 0602 09/22/23 0952 09/22/23 1200 09/22/23 1355  BP: 117/64 119/78 124/72   Pulse: 70 71 66   Resp: 17 19 20    Temp: 98.1 F (36.7 C) 98.3 F (36.8 C)  98.3 F (36.8 C)   TempSrc: Oral Oral  Oral  SpO2: 94% 93% 93%   Weight:      Height:       General exam: Appears calm and comfortable, morbid obese. Respiratory system: Clear to auscultation. Respiratory effort normal. Cardiovascular system: S1 & S2 heard, RRR. No JVD, murmurs, rubs, gallops or clicks. No pedal edema. Gastrointestinal system: Abdomen is nondistended, soft and nontender. No organomegaly or masses felt. Normal bowel sounds heard. Central nervous system: Alert and oriented. No focal neurological deficits. Extremities: Symmetric 5 x 5 power. Skin: No rashes, lesions or ulcers Psychiatry: Judgement and insight appear normal. Mood & affect appropriate.    Data Reviewed:  Lab results reviewed.  Family Communication: None  Disposition: Status is: Inpatient Remains inpatient appropriate because: Severity of disease, unsafe discharge.     Time spent: 35 minutes  Author: Marrion Coy, MD 09/22/2023 2:38 PM  For on call review [email or web address].

## 2023-09-22 NOTE — ED Notes (Signed)
IVC/PENDING PSYCH CONSULT 

## 2023-09-22 NOTE — Progress Notes (Signed)
[facility] CLINIC CARDIOLOGY PROGRESS NOTE       Patient ID: Christy Mayer MRN: [id number] DOB/AGE: 58/17/1967 58 y.o.  Admit date: 09/21/2023 Referring Physician Dr. Valente David Primary Physician Althea Charon, Netta Neat, DO  Primary Cardiologist None Reason for Consultation AF RVR  HPI: Christy Mayer is a 57 y.o. female  with a past medical history of poorly controlled type II diabetes, COPD, mitral valve prolapse, asthma who presented to the ED on 09/21/2023 for intentional overdose. She took 20 tablets of trazodone and drank alcohol, began having nausea/vomiting and called EMS. Found to be in atrial fibrillation RVR in the ED. Cardiology was consulted for further evaluation.   Interval history: -Patient seen and examined this AM, reports feeling well today.  -States she had an episode of palpitations for about 5 minutes yesterday evening but no recurrence since. No tele for review. -BP and HR remain stable. Denies CP. SOB is at baseline.  Review of systems complete and found to be negative unless listed above    Past Medical History:  Diagnosis Date   Arthritis    Asthma    Back pain    COPD (chronic obstructive pulmonary disease) (HCC)    Emphysema lung (HCC)    Heart palpitations    Miscarriage    Mitral valve prolapse     Past Surgical History:  Procedure Laterality Date   CESAREAN SECTION  11/2007    (Not in a hospital admission)  Social History   Socioeconomic History   Marital status: Married    Spouse name: Not on file   Number of children: Not on file   Years of education: Not on file   Highest education level: Not on file  Occupational History   Occupation: work from home  Tobacco Use   Smoking status: Every Day    Current packs/day: 0.50    Average packs/day: 0.5 packs/day for 30.0 years (15.0 ttl pk-yrs)    Types: Cigarettes   Smokeless tobacco: Never  Vaping Use   Vaping status: Never Used  Substance and Sexual Activity   Alcohol use: Yes     Alcohol/week: 0.0 standard drinks of alcohol    Comment: occasionally    Drug use: No   Sexual activity: Yes    Birth control/protection: None  Other Topics Concern   Not on file  Social History Narrative   Not on file   Social Drivers of Health   Financial Resource Strain: Not on file  Food Insecurity: Not on file  Transportation Needs: Not on file  Physical Activity: Not on file  Stress: Not on file  Social Connections: Not on file  Intimate Partner Violence: Not on file    Family History  Adopted: Yes     Vitals:   09/21/23 1831 09/21/23 2335 09/22/23 0602 09/22/23 0952  BP:  112/69 117/64 119/78  Pulse:  67 70 71  Resp:  16 17 19   Temp: 99.2 F (37.3 C) 98.7 F (37.1 C) 98.1 F (36.7 C) 98.3 F (36.8 C)  TempSrc: Oral  Oral Oral  SpO2:  94% 94% 93%  Weight:      Height:        PHYSICAL EXAM General: Well appearing female, well nourished, in no acute distress resting comfortably in ED stretcher. HEENT: Normocephalic and atraumatic. Neck: No JVD.  Lungs: Normal respiratory effort on 2L. Clear bilaterally to auscultation. No wheezes, crackles, rhonchi.  Heart: HRRR. Normal S1 and S2 without gallops or murmurs.  Abdomen: Non-distended appearing.  Msk: Normal strength and tone for age. Extremities: Warm and well perfused. No clubbing, cyanosis. No edema.  Neuro: Alert and oriented X 3. Psych: Answers questions appropriately.   Labs: Basic Metabolic Panel: Recent Labs    09/21/23 1026 09/21/23 1904 09/22/23 0520  NA 133*  --  134*  K 5.4* 3.8 3.9  CL 102  --  104  CO2 24  --  25  GLUCOSE 278*  --  197*  BUN 15  --  12  CREATININE 0.72  --  0.65  CALCIUM 8.2*  --  7.9*  MG 1.8  --  2.2   Liver Function Tests: Recent Labs    09/21/23 0053  AST 17  ALT 26  ALKPHOS 80  BILITOT 0.6  PROT 7.7  ALBUMIN 4.2   No results for input(s): "LIPASE", "AMYLASE" in the last 72 hours. CBC: Recent Labs    09/21/23 0053 09/22/23 0520  WBC 6.8 8.6  HGB  15.1* 12.7  HCT 43.7 38.0  MCV 96.0 99.5  PLT 189 189   Cardiac Enzymes: No results for input(s): "CKTOTAL", "CKMB", "CKMBINDEX", "TROPONINIHS" in the last 72 hours. BNP: No results for input(s): "BNP" in the last 72 hours. D-Dimer: No results for input(s): "DDIMER" in the last 72 hours. Hemoglobin A1C: Recent Labs    09/21/23 0610  HGBA1C 9.0*   Fasting Lipid Panel: No results for input(s): "CHOL", "HDL", "LDLCALC", "TRIG", "CHOLHDL", "LDLDIRECT" in the last 72 hours. Thyroid Function Tests: No results for input(s): "TSH", "T4TOTAL", "T3FREE", "THYROIDAB" in the last 72 hours.  Invalid input(s): "FREET3" Anemia Panel: No results for input(s): "VITAMINB12", "FOLATE", "FERRITIN", "TIBC", "IRON", "RETICCTPCT" in the last 72 hours.   Radiology: [facility] Chest Port 1 View Result Date: 09/21/2023 CLINICAL DATA:  Recent EXAM: PORTABLE CHEST 1 VIEW COMPARISON:  02/05/2022 FINDINGS: The heart size and mediastinal contours are within normal limits. Both lungs are clear. The visualized skeletal structures are unremarkable. IMPRESSION: No active disease. Electronically Signed   By: Alcide Clever M.D.   On: 09/21/2023 00:49    ECHO pending  TELEMETRY reviewed by me 09/22/2023: not on tele  EKG reviewed by me: atrial fibrillation RVR rate 127 bpm  Data reviewed by me 09/22/2023: last 24h vitals tele labs imaging I/O hospitalist progress note  Principal Problem:   Atrial fibrillation with RVR (HCC) Active Problems:   Major depression, recurrent, chronic (HCC)   Uncontrolled type 2 diabetes mellitus with hyperglycemia, with long-term current use of insulin (HCC)   Hyponatremia   Drug overdose, intentional, initial encounter (HCC)   Hypokalemia   Suicidal behavior with attempted self-injury (HCC)   Anxiety   Overdose    ASSESSMENT AND PLAN:  Christy Mayer is a 57 y.o. female  with a past medical history of poorly controlled type II diabetes, COPD, mitral valve prolapse, asthma who  presented to the ED on 09/21/2023 for intentional overdose. She took 20 tablets of trazodone and drank alcohol, began having nausea/vomiting and called EMS. Found to be in atrial fibrillation RVR in the ED. Cardiology was consulted for further evaluation.   # Atrial fibrillation RVR # New onset atrial fibrillation Patient patient with new onset atrial fibrillation RVR in the setting of overdose attempt with trazodone and alcohol.  Converted to normal sinus rhythm with IV fluids and IV diltiazem 15 mg.  Reports history of frequent palpitation episodes.  CHA2DS2-VASc score 3 (female, type 2 diabetes, aortic atherosclerosis by CT scan) -Defer starting any rate control/antiarrhythmics as patient now in  NSR and episode likely precipitated by alcohol consumption and trazodone.  -Defer starting anticoagulation given episode likely 2/2 alcohol consumption and trazodone. -Can consider holter monitor placement prior to DC for additional evaluation.  -Monitor and replenish electrolytes for a goal K >4, Mag >2    This patient's plan of care was discussed and created with Dr. Juliann Pares and he is in agreement.  Signed: Gale Journey, PA-C  09/22/2023, 10:43 AM [facility] Cardiology

## 2023-09-22 NOTE — Consult Note (Signed)
[facility] Face-to-Face Psychiatry Consult   Reason for Consult:  Suicide attempt Referring Physician:  Marrion Coy, MD  Patient Identification: Christy Mayer MRN:  [id number] Principal Diagnosis: Atrial fibrillation with RVR [facility]) Diagnosis:  Principal Problem:   Atrial fibrillation with RVR (HCC) Active Problems:   Major depression, recurrent, chronic (HCC)   Uncontrolled type 2 diabetes mellitus with hyperglycemia, with long-term current use of insulin (HCC)   Hyponatremia   Drug overdose, intentional, initial encounter (HCC)   Hypokalemia   Suicidal behavior with attempted self-injury [facility])   Anxiety   Overdose   Subjective:   Christy Mayer is a 57 y.o. female  with a past medical history of poorly controlled type II diabetes, COPD, mitral valve prolapse, asthma who presented to the ED on 09/21/2023 for intentional overdose. She took 20 tablets of trazodone and drank alcohol, began having nausea/vomiting and called EMS   HPI: Patient is a 57 year old female admitted secondary to overdose on 20 tablets of 150 mg trazodone.  Patient was seen in hall bed 19.  Patient reports that she got into an argument with her husband.  Patient said that he going through a lot of stress especially at work.  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  He endorses depressed mood, anhedonia.  She reports suicidal thoughts, no homicidal thoughts reported.  Patient denies psychotic symptoms.  Patient reports that after taking overdose she had GI symptoms and called 911.  Patient denies past history of suicide attempts or psychiatry inpatient treatment.   Past Psychiatric History: Patient reports past history of depression.  She states she has been on Wellbutrin and Xanax.  She denies past history of suicide attempt or inpatient treatment  Risk to Self:  Yes Risk to Others:  No Prior Inpatient Therapy:No Prior Outpatient Therapy:  Yes  Past Medical History:  Past Medical  History:  Diagnosis Date   Arthritis    Asthma    Back pain    COPD (chronic obstructive pulmonary disease) (HCC)    Emphysema lung (HCC)    Heart palpitations    Miscarriage    Mitral valve prolapse     Past Surgical History:  Procedure Laterality Date   CESAREAN SECTION  11/2007   Family History:  Family History  Adopted: Yes   Family Psychiatric  History: Patient reports her mother has been diagnosed with schizophrenia  Social History:  Social History   Substance and Sexual Activity  Alcohol Use Yes   Alcohol/week: 0.0 standard drinks of alcohol   Comment: occasionally      Social History   Substance and Sexual Activity  Drug Use No    Social History   Socioeconomic History   Marital status: Married    Spouse name: Not on file   Number of children: Not on file   Years of education: Not on file   Highest education level: Not on file  Occupational History   Occupation: work from home  Tobacco Use   Smoking status: Every Day    Current packs/day: 0.50    Average packs/day: 0.5 packs/day for 30.0 years (15.0 ttl pk-yrs)    Types: Cigarettes   Smokeless tobacco: Never  Vaping Use   Vaping status: Never Used  Substance and Sexual Activity   Alcohol use: Yes    Alcohol/week: 0.0 standard drinks of alcohol    Comment: occasionally    Drug use: No   Sexual activity: Yes    Birth control/protection: None  Other Topics Concern   Not on file  Social History Narrative   Not on file   Social Drivers of Health   Financial Resource Strain: Not on file  Food Insecurity: Not on file  Transportation Needs: Not on file  Physical Activity: Not on file  Stress: Not on file  Social Connections: Not on file   Additional Social History:  Patient is married and lives her husband  Allergies:   Allergies  Allergen Reactions   Aspirin Nausea Only    Labs:  Results for orders placed or performed during the hospital encounter of 09/21/23 (from the past 48 hours)   Comprehensive metabolic panel     Status: Abnormal   Collection Time: 09/21/23 12:53 AM  Result Value Ref Range   Sodium 133 (L) 135 - 145 mmol/L   Potassium 2.9 (L) 3.5 - 5.1 mmol/L   Chloride 96 (L) 98 - 111 mmol/L   CO2 22 22 - 32 mmol/L   Glucose, Bld 318 (H) 70 - 99 mg/dL    Comment: Glucose reference range applies only to samples taken after fasting for at least 8 hours.   BUN 15 6 - 20 mg/dL   Creatinine, Ser 3.81 0.44 - 1.00 mg/dL   Calcium 8.7 (L) 8.9 - 10.3 mg/dL   Total Protein 7.7 6.5 - 8.1 g/dL   Albumin 4.2 3.5 - 5.0 g/dL   AST 17 15 - 41 U/L   ALT 26 0 - 44 U/L   Alkaline Phosphatase 80 38 - 126 U/L   Total Bilirubin 0.6 <1.2 mg/dL   GFR, Estimated >01 >75 mL/min    Comment: (NOTE) Calculated using the CKD-EPI Creatinine Equation (2021)    Anion gap 15 5 - 15    Comment: Performed at [facility], [address] [place], [place] [address]  Ethanol     Status: Abnormal   Collection Time: 09/21/23 12:53 AM  Result Value Ref Range   Alcohol, Ethyl (B) 152 (H) <10 mg/dL    Comment: (NOTE) Lowest detectable limit for serum alcohol is 10 mg/dL.  For medical purposes only. Performed at [facility], [address] [place], [place] [address]   Salicylate level     Status: Abnormal   Collection Time: 09/21/23 12:53 AM  Result Value Ref Range   Salicylate Lvl <7.0 (L) 7.0 - 30.0 mg/dL    Comment: Performed at [facility], [address] [place], [place] [address]  Acetaminophen level     Status: Abnormal   Collection Time: 09/21/23 12:53 AM  Result Value Ref Range   Acetaminophen (Tylenol), Serum <10 (L) 10 - 30 ug/mL    Comment: (NOTE) Therapeutic concentrations vary significantly. A range of 10-30 ug/mL  may be an effective concentration for many patients. However, some  are best treated at concentrations outside of this range. Acetaminophen concentrations >150 ug/mL at 4 hours after ingestion  and >50 ug/mL at 12 hours after  ingestion are often associated with  toxic reactions.  Performed at [facility], [address] [place], [place] [address]   cbc     Status: Abnormal   Collection Time: 09/21/23 12:53 AM  Result Value Ref Range   WBC 6.8 4.0 - 10.5 K/uL   RBC 4.55 3.87 - 5.11 MIL/uL   Hemoglobin 15.1 (H) 12.0 - 15.0 g/dL   HCT 31.5 40.0 - 86.7 %   MCV 96.0 80.0 - 100.0 fL   MCH 33.2 26.0 - 34.0 pg  MCHC 34.6 30.0 - 36.0 g/dL   RDW 16.1 (L) 09.6 - 04.5 %   Platelets 189 150 - 400 K/uL   nRBC 0.0 0.0 - 0.2 %    Comment: Performed at [facility], [address] [place], [place] [address]  Acetaminophen level     Status: Abnormal   Collection Time: 09/21/23  6:10 AM  Result Value Ref Range   Acetaminophen (Tylenol), Serum <10 (L) 10 - 30 ug/mL    Comment: (NOTE) Therapeutic concentrations vary significantly. A range of 10-30 ug/mL  may be an effective concentration for many patients. However, some  are best treated at concentrations outside of this range. Acetaminophen concentrations >150 ug/mL at 4 hours after ingestion  and >50 ug/mL at 12 hours after ingestion are often associated with  toxic reactions.  Performed at [facility], [address] [place], [place] [address]   Salicylate level     Status: Abnormal   Collection Time: 09/21/23  6:10 AM  Result Value Ref Range   Salicylate Lvl <7.0 (L) 7.0 - 30.0 mg/dL    Comment: Performed at [facility], [address] [place], [place] [address]  HIV Antibody (routine testing w rflx)     Status: None   Collection Time: 09/21/23  6:10 AM  Result Value Ref Range   HIV Screen 4th Generation wRfx Non Reactive Non Reactive    Comment: Performed at [facility], [address] [place], [place] [address]  Hemoglobin A1c     Status: Abnormal   Collection Time: 09/21/23  6:10 AM  Result Value Ref Range   Hgb A1c MFr Bld 9.0 (H) 4.8 - 5.6 %    Comment: (NOTE) Pre diabetes:           5.7%-6.4%  Diabetes:              >6.4%  Glycemic control for   <7.0% adults with diabetes    Mean Plasma Glucose 211.6 mg/dL    Comment: Performed at [facility], [address] [place], [place] [address]  CBG monitoring, ED     Status: Abnormal   Collection Time: 09/21/23  8:09 AM  Result Value Ref Range   Glucose-Capillary 269 (H) 70 - 99 mg/dL    Comment: Glucose reference range applies only to samples taken after fasting for at least 8 hours.  Basic metabolic panel     Status: Abnormal   Collection Time: 09/21/23 10:26 AM  Result Value Ref Range   Sodium 133 (L) 135 - 145 mmol/L   Potassium 5.4 (H) 3.5 - 5.1 mmol/L   Chloride 102 98 - 111 mmol/L   CO2 24 22 - 32 mmol/L   Glucose, Bld 278 (H) 70 - 99 mg/dL    Comment: Glucose reference range applies only to samples taken after fasting for at least 8 hours.   BUN 15 6 - 20 mg/dL   Creatinine, Ser 6.96 0.44 - 1.00 mg/dL   Calcium 8.2 (L) 8.9 - 10.3 mg/dL   GFR, Estimated >29 >52 mL/min    Comment: (NOTE) Calculated using the CKD-EPI Creatinine Equation (2021)    Anion gap 7 5 - 15    Comment: Performed at [facility], [address] [place], [place] [address]  Magnesium     Status: None   Collection Time: 09/21/23 10:26 AM  Result Value Ref Range   Magnesium 1.8 1.7 - 2.4 mg/dL    Comment: Performed at [facility], [address] [place], [place] [address]  CBG monitoring, ED     Status: Abnormal   Collection Time: 09/21/23 12:24 PM  Result Value Ref Range   Glucose-Capillary 188 (H) 70 - 99 mg/dL    Comment: Glucose reference range applies only to samples taken after fasting for at least 8 hours.  CBG monitoring, ED     Status: Abnormal   Collection Time: 09/21/23  4:28 PM  Result Value Ref Range   Glucose-Capillary 177 (H) 70 - 99 mg/dL    Comment: Glucose reference range applies only to samples taken after fasting for at least 8 hours.  Potassium     Status: None   Collection Time:  09/21/23  7:04 PM  Result Value Ref Range   Potassium 3.8 3.5 - 5.1 mmol/L    Comment: Performed at [facility], [address] [place], [place] [address]  CBG monitoring, ED     Status: Abnormal   Collection Time: 09/21/23  9:35 PM  Result Value Ref Range   Glucose-Capillary 262 (H) 70 - 99 mg/dL    Comment: Glucose reference range applies only to samples taken after fasting for at least 8 hours.  CBC     Status: Abnormal   Collection Time: 09/22/23  5:20 AM  Result Value Ref Range   WBC 8.6 4.0 - 10.5 K/uL   RBC 3.82 (L) 3.87 - 5.11 MIL/uL   Hemoglobin 12.7 12.0 - 15.0 g/dL   HCT 09.8 11.9 - 14.7 %   MCV 99.5 80.0 - 100.0 fL   MCH 33.2 26.0 - 34.0 pg   MCHC 33.4 30.0 - 36.0 g/dL   RDW 82.9 56.2 - 13.0 %   Platelets 189 150 - 400 K/uL   nRBC 0.0 0.0 - 0.2 %    Comment: Performed at [facility], [address], [place], [place] [address]  Basic metabolic panel     Status: Abnormal   Collection Time: 09/22/23  5:20 AM  Result Value Ref Range   Sodium 134 (L) 135 - 145 mmol/L   Potassium 3.9 3.5 - 5.1 mmol/L   Chloride 104 98 - 111 mmol/L   CO2 25 22 - 32 mmol/L   Glucose, Bld 197 (H) 70 - 99 mg/dL    Comment: Glucose reference range applies only to samples taken after fasting for at least 8 hours.   BUN 12 6 - 20 mg/dL   Creatinine, Ser 4.69 0.44 - 1.00 mg/dL   Calcium 7.9 (L) 8.9 - 10.3 mg/dL   GFR, Estimated >62 >95 mL/min    Comment: (NOTE) Calculated using the CKD-EPI Creatinine Equation (2021)    Anion gap 5 5 - 15    Comment: Performed at [facility], [address], [place], [place] [address]  Magnesium     Status: None   Collection Time: 09/22/23  5:20 AM  Result Value Ref Range   Magnesium 2.2 1.7 - 2.4 mg/dL    Comment: Performed at [facility], [address] [place], [place] [address]  CBG monitoring, ED     Status: Abnormal   Collection Time: 09/22/23  8:33 AM  Result Value Ref Range   Glucose-Capillary 212 (H)  70 - 99 mg/dL    Comment: Glucose reference range applies only to samples taken after fasting for at least 8 hours.  Urinalysis, Routine w reflex microscopic -Urine, Clean Catch     Status: Abnormal   Collection Time: 09/22/23  8:59 AM  Result Value Ref Range   Color, Urine YELLOW (A) YELLOW   APPearance HAZY (A) CLEAR   Specific Gravity, Urine 1.010 1.005 - 1.030   pH 5.0 5.0 - 8.0   Glucose, UA NEGATIVE NEGATIVE mg/dL   Hgb urine dipstick NEGATIVE NEGATIVE   Bilirubin Urine NEGATIVE NEGATIVE   Ketones, ur NEGATIVE NEGATIVE mg/dL   Protein, ur NEGATIVE NEGATIVE mg/dL   Nitrite NEGATIVE NEGATIVE   Leukocytes,Ua NEGATIVE NEGATIVE   RBC / HPF 0-5 0 - 5 RBC/hpf   WBC, UA 0-5 0 - 5 WBC/hpf   Bacteria, UA RARE (A) NONE SEEN   Squamous Epithelial / HPF 6-10 0 - 5 /HPF    Comment: Performed at [facility], [address] [place], [place] [address]  Urine Drug Screen, Qualitative     Status: None   Collection Time: 09/22/23  8:59 AM  Result Value Ref Range   Tricyclic, Ur Screen NONE DETECTED NONE DETECTED   Amphetamines, Ur Screen NONE DETECTED NONE DETECTED   MDMA (Ecstasy)Ur Screen NONE DETECTED NONE DETECTED   Cocaine Metabolite,Ur [place] NONE DETECTED NONE DETECTED   Opiate, Ur Screen NONE DETECTED NONE DETECTED   Phencyclidine (PCP) Ur S NONE DETECTED NONE DETECTED   Cannabinoid 50 Ng, Ur [place] NONE DETECTED NONE DETECTED   Barbiturates, Ur Screen NONE DETECTED NONE DETECTED   Benzodiazepine, Ur Scrn NONE DETECTED NONE DETECTED   Methadone Scn, Ur NONE DETECTED NONE DETECTED    Comment: (NOTE) Tricyclics + metabolites, urine    Cutoff 1000 ng/mL Amphetamines + metabolites, urine  Cutoff 1000 ng/mL MDMA (Ecstasy), urine              Cutoff 500 ng/mL Cocaine Metabolite, urine          Cutoff 300 ng/mL Opiate + metabolites, urine        Cutoff 300 ng/mL Phencyclidine (PCP), urine         Cutoff 25 ng/mL Cannabinoid, urine                 Cutoff 50 ng/mL Barbiturates +  metabolites, urine  Cutoff 200 ng/mL Benzodiazepine, urine              Cutoff 200 ng/mL Methadone, urine                   Cutoff 300 ng/mL  The urine drug screen provides only a preliminary, unconfirmed analytical test result and should not be used for non-medical purposes. Clinical consideration and professional judgment should be applied to any positive drug screen result due to possible interfering substances. A more specific alternate chemical method must be used in order to obtain a confirmed analytical result. Gas chromatography / mass spectrometry (GC/MS) is the preferred confirm atory method. Performed at [facility], [address] [place], [place] [address]   CBG monitoring, ED     Status: Abnormal   Collection Time: 09/22/23 11:39 AM  Result Value Ref Range   Glucose-Capillary 122 (H) 70 - 99 mg/dL    Comment: Glucose reference range applies only to samples taken after fasting for at least 8 hours.  CBG monitoring, ED     Status: Abnormal   Collection Time: 09/22/23  4:44 PM  Result Value Ref Range   Glucose-Capillary 150 (H) 70 - 99 mg/dL    Comment: Glucose reference range applies only to samples taken after fasting for at least 8 hours.    Current Facility-Administered Medications  Medication Dose Route Frequency Provider Last  Rate Last Admin   acetaminophen (TYLENOL) tablet 650 mg  650 mg Oral Q6H PRN Mansy, Jan A, MD   650 mg at 09/22/23 1834   Or   acetaminophen (TYLENOL) suppository 650 mg  650 mg Rectal Q6H PRN Mansy, Jan A, MD       albuterol (PROVENTIL) (2.5 MG/3ML) 0.083% nebulizer solution 2.5 mg  2.5 mg Nebulization Q6H PRN Otelia Sergeant, RPH       ALPRAZolam Prudy Feeler) tablet 0.25 mg  0.25 mg Oral BID Mansy, Jan A, MD   0.25 mg at 09/22/23 0947   buPROPion (WELLBUTRIN XL) 24 hr tablet 150 mg  150 mg Oral Daily Mansy, Jan A, MD   150 mg at 09/22/23 0946   enoxaparin (LOVENOX) injection 55 mg  55 mg Subcutaneous Q24H Mansy, Jan A, MD        fluticasone (FLONASE) 50 MCG/ACT nasal spray 2 spray  2 spray Each Nare Daily PRN Mansy, Jan A, MD       gabapentin (NEURONTIN) capsule 300 mg  300 mg Oral TID Mansy, Jan A, MD   300 mg at 09/22/23 1812   insulin aspart (novoLOG) injection 0-15 Units  0-15 Units Subcutaneous TID WC Marrion Coy, MD   2 Units at 09/22/23 1812   insulin aspart (novoLOG) injection 0-5 Units  0-5 Units Subcutaneous QHS Mansy, Jan A, MD   3 Units at 09/21/23 2255   insulin aspart (novoLOG) injection 5 Units  5 Units Subcutaneous TID WC Marrion Coy, MD   5 Units at 09/22/23 1813   insulin glargine-yfgn (SEMGLEE) injection 16 Units  16 Units Subcutaneous QHS Marrion Coy, MD   16 Units at 09/21/23 2256   lidocaine (LIDODERM) 5 % 1 patch  1 patch Transdermal Q24H Manuela Schwartz, NP       loratadine (CLARITIN) tablet 10 mg  10 mg Oral Daily Mansy, Jan A, MD   10 mg at 09/22/23 0946   LORazepam (ATIVAN) injection 1 mg  1 mg Intravenous Q1H PRN Mansy, Jan A, MD       magnesium hydroxide (MILK OF MAGNESIA) suspension 30 mL  30 mL Oral Daily PRN Mansy, Jan A, MD       nicotine (NICODERM CQ - dosed in mg/24 hours) patch 14 mg  14 mg Transdermal Daily Mansy, Jan A, MD   14 mg at 09/22/23 0946   ondansetron (ZOFRAN) tablet 4 mg  4 mg Oral Q6H PRN Mansy, Jan A, MD       Or   ondansetron [facility]) injection 4 mg  4 mg Intravenous Q6H PRN Mansy, Jan A, MD       polyethylene glycol (MIRALAX / GLYCOLAX) packet 17 g  17 g Oral Daily PRN Mansy, Jan A, MD       traZODone (DESYREL) tablet 25 mg  25 mg Oral QHS PRN Mansy, Jan A, MD       traZODone (DESYREL) tablet 300 mg  300 mg Oral QHS Mansy, Jan A, MD   300 mg at 09/21/23 2253   Current Outpatient Medications  Medication Sig Dispense Refill   ALPRAZolam (XANAX) 0.25 MG tablet TAKE 1 TABLET(0.25 MG) BY MOUTH TWICE DAILY 60 tablet 2   buPROPion (WELLBUTRIN XL) 150 MG 24 hr tablet Take 1 tablet (150 mg total) by mouth daily. 90 tablet 0   gabapentin (NEURONTIN) 300 MG capsule TAKE 1  CAPSULE(300 MG) BY MOUTH THREE TIMES DAILY 90 capsule 2   ACCU-CHEK GUIDE test strip Use to check blood sugar  up to 2 times daily 200 each 3   Accu-Chek Softclix Lancets lancets Check sugar up to 2 times daily 200 each 3   Alcohol Swabs PADS 1 Dose by Does not apply route 3 (three) times daily before meals. 200 each 0   Blood Glucose Calibration (ACCU-CHEK GUIDE CONTROL) LIQD Use for glucometer 1 each 0   Blood Glucose Monitoring Suppl (ACCU-CHEK GUIDE) w/Device KIT Use to check blood sugar up to 2 x daily 1 kit 0   budesonide-formoterol (SYMBICORT) 160-4.5 MCG/ACT inhaler Inhale 2 puffs into the lungs daily. Need future visit for refills (Patient not taking: Reported on 09/21/2023) 10.2 g 5   fluticasone (FLONASE) 50 MCG/ACT nasal spray Place 2 sprays into both nostrils daily. Use for 4-6 weeks then stop and use seasonally or as needed. (Patient not taking: Reported on 09/21/2023) 16 g 3   insulin aspart (FIASP) 100 UNIT/ML FlexTouch Pen 6 units prior to meals (okay to switch to any short acting insulin that is covered) (Patient not taking: Reported on 09/21/2023) 15 mL 0   insulin detemir (LEVEMIR) 100 UNIT/ML FlexPen 20 units subcutaneous injection (okay to switch to any long acting insulin that is covered) (Patient not taking: Reported on 09/21/2023) 15 mL 0   Insulin Pen Needle 33G X 5 MM MISC 1 Dose by Does not apply route 3 (three) times daily before meals. 200 each 0   loratadine (CLARITIN) 10 MG tablet Take 1 tablet (10 mg total) by mouth daily. (Patient not taking: Reported on 09/21/2023) 90 tablet 3   metFORMIN (GLUCOPHAGE) 500 MG tablet Take 2 tablets (1,000 mg total) by mouth 2 (two) times daily with a meal. (Patient not taking: Reported on 09/21/2023) 360 tablet 1   nicotine (NICODERM CQ - DOSED IN MG/24 HOURS) 14 mg/24hr patch One patch chest wall daily (substitute generic) 28 patch 0   ondansetron (ZOFRAN ODT) 4 MG disintegrating tablet Take 1 tablet (4 mg total) by mouth every 8  (eight) hours as needed for nausea or vomiting. (Patient not taking: Reported on 09/21/2023) 30 tablet 0   polyethylene glycol (MIRALAX / GLYCOLAX) 17 g packet Take 17 g by mouth daily as needed for moderate constipation. (Patient not taking: Reported on 09/21/2023) 14 each 0   traZODone (DESYREL) 50 MG tablet Take 6 tablets (300 mg total) by mouth at bedtime. (Patient not taking: Reported on 09/21/2023) 90 tablet 1   TRULICITY 0.75 MG/0.5ML SOPN Inject 0.75 mg into the skin once a week. (Patient not taking: Reported on 09/21/2023) 2 mL 2   UBRELVY 50 MG TABS Take one tab 50mg  for migraine headache, may repeat dose 50mg  within 2 hours of initial dose only. Max dose in 24 hours is 100mg  or 2 tabs (Patient not taking: Reported on 09/21/2023) 16 tablet 5   VENTOLIN HFA 108 (90 Base) MCG/ACT inhaler Inhale 1-2 puffs into the lungs every 6 (six) hours as needed for wheezing or shortness of breath. (Patient not taking: Reported on 09/21/2023) 18 g 5    Musculoskeletal: Strength & Muscle Tone: within normal limits Gait & Station: normal Patient leans: N/A            Psychiatric Specialty Exam:  Presentation  General Appearance: Appropriate for Environment; Disheveled  Eye Contact:Fair  Speech:Clear and Coherent  Speech Volume:Normal  Mood and Affect  Mood:Depressed; Anxious  Affect:Congruent; Restricted   Thought Process  Thought Processes:Coherent; Goal Directed  Descriptions of Associations:Intact  Orientation:Full (Time, Place and Person)  Thought Content:Abstract Reasoning  History  of Schizophrenia/Schizoaffective disorder:No Duration of Psychotic Symptoms:NA  Hallucinations:Hallucinations: None  Ideas of Reference:None  Suicidal Thoughts:Suicidal Thoughts: Yes, Active SI Active Intent and/or Plan: Without Plan  Homicidal Thoughts:Homicidal Thoughts: No   Sensorium  Memory:Immediate Fair; Recent Fair  Judgment:Poor  Insight:Shallow   Executive  Functions  Concentration:Fair  Attention Span:Fair  Recall:Fair  Fund of Knowledge:Fair  Language:Fair   Psychomotor Activity  Psychomotor Activity:Psychomotor Activity: Decreased   Assets  Assets:Communication Skills; Desire for Improvement; Housing; Social Support; Vocational/Educational   Sleep  Sleep:Sleep: Poor   Physical Exam: Physical Exam Constitutional:      Appearance: Normal appearance.  HENT:     Head: Normocephalic and atraumatic.     Nose: No congestion.     Mouth/Throat:     Mouth: Mucous membranes are moist.  Eyes:     Pupils: Pupils are equal, round, and reactive to light.  Pulmonary:     Effort: Pulmonary effort is normal.  Skin:    General: Skin is warm.  Neurological:     General: No focal deficit present.     Mental Status: She is alert and oriented to person, place, and time.    Review of Systems  Constitutional:  Negative for chills and fever.  HENT:  Negative for congestion, hearing loss and sore throat.   Respiratory:  Negative for cough and shortness of breath.   Cardiovascular:  Negative for chest pain and palpitations.  Neurological:  Negative for dizziness, speech change and headaches.  Psychiatric/Behavioral:  Positive for depression and suicidal ideas. The patient is nervous/anxious and has insomnia.    Blood pressure 136/74, pulse 76, temperature 98.2 F (36.8 C), resp. rate 16, height 5\' 7"  (1.702 m), weight 115 kg, last menstrual period 08/11/2014, SpO2 96%. Body mass index is 39.71 kg/m.  Treatment Plan Summary: Patient is 57 year old female who is admitted secondary to intentional overdose on trazodone after getting into an argument with her husband.  Patient also reports she drank 6 beers along with trazodone.  Initially patient was found to be in atrial fibrillation RVR on arrival to ED.  Patient has been medically stabilized  Will recommend psychiatry inpatient treatment for further evaluation and stabilization 2.    Will defer use of psychotropic medicines at this point in time due to overdose on trazodone  Psychiatry consult service will sign off at this time.  Please call back if further assistance is needed  Lewanda Rife, MD 09/22/2023 8:47 PM

## 2023-09-22 NOTE — ED Notes (Signed)
Pt dinner meal provided

## 2023-09-22 NOTE — ED Notes (Signed)
Patient placed on k-pad for heating.  It was ordered for back pain.

## 2023-09-22 NOTE — ED Notes (Signed)
[facility] Medical Admit pending bed

## 2023-09-22 NOTE — Hospital Course (Signed)
Christy Mayer is a 57 y.o. Caucasian female with medical history significant for COPD, mitral valve prolapse, asthma and osteoarthritis, presented to the emergency room with acute onset of overdose with trazodone together with alcohol.  She was monitored overnight, condition has improved.  Pending psych evaluation.

## 2023-09-22 NOTE — Progress Notes (Signed)
*  PRELIMINARY RESULTS* Echocardiogram 2D Echocardiogram has been performed.  Christy Mayer 09/22/2023, 9:57 AM

## 2023-09-23 DIAGNOSIS — T1491XA Suicide attempt, initial encounter: Secondary | ICD-10-CM | POA: Diagnosis not present

## 2023-09-23 DIAGNOSIS — T50902A Poisoning by unspecified drugs, medicaments and biological substances, intentional self-harm, initial encounter: Secondary | ICD-10-CM | POA: Diagnosis not present

## 2023-09-23 DIAGNOSIS — I4891 Unspecified atrial fibrillation: Secondary | ICD-10-CM | POA: Diagnosis not present

## 2023-09-23 LAB — CBG MONITORING, ED
Glucose-Capillary: 187 mg/dL — ABNORMAL HIGH (ref 70–99)
Glucose-Capillary: 202 mg/dL — ABNORMAL HIGH (ref 70–99)
Glucose-Capillary: 120 mg/dL — ABNORMAL HIGH (ref 70–99)
Glucose-Capillary: 163 mg/dL — ABNORMAL HIGH (ref 70–99)

## 2023-09-23 MED ORDER — OXYCODONE-ACETAMINOPHEN 5-325 MG PO TABS
1.0000 | ORAL_TABLET | Freq: Three times a day (TID) | ORAL | Status: DC | PRN
  Administered 2023-09-23 (×2): 1 via ORAL
  Filled 2023-09-23 (×2): qty 1

## 2023-09-23 NOTE — ED Notes (Signed)
Patient's pain is 10/10, but patient requested to try Tylenol prior to asking MD for different medication (not time yet for next dose of Percocet).

## 2023-09-23 NOTE — ED Notes (Signed)
Pt is watching TV. RN introduce herself to pt no needs at this time

## 2023-09-23 NOTE — ED Notes (Signed)
Patient has been accepted to [facility].  Patient assigned to room 402-2 Accepting physician is Dr. Octavia Bruckner.  Call report to [phone].  Representative was [place].   ER Staff is aware of it:  Carleen ER Secretary  Dr. Marland Kitchen ER MD  Catha Nottingham Patient's Nurse

## 2023-09-23 NOTE — ED Notes (Signed)
Ivc /patient accepted to [facility] call report [phone] can arrive 09/24/23 @ 8:30 am accepting physician Dr. Octavia Bruckner

## 2023-09-23 NOTE — Progress Notes (Addendum)
As needed Progress Note   Patient: Christy Mayer OZH:[id number] DOB: 05/01/1966 DOA: 09/21/2023     2 DOS: the patient was seen and examined on 09/23/2023   Brief hospital course: Christy Mayer is a 57 y.o. Caucasian female with medical history significant for COPD, mitral valve prolapse, asthma and osteoarthritis, presented to the emergency room with acute onset of overdose with trazodone together with alcohol.  She was monitored overnight, condition has improved.  Patient has been evaluated by psychiatry, recommend psychiatry admission.  Currently pending transfer.   Principal Problem:   Atrial fibrillation with RVR (HCC) Active Problems:   Suicidal behavior with attempted self-injury (HCC)   Uncontrolled type 2 diabetes mellitus with hyperglycemia, with long-term current use of insulin (HCC)   Hypokalemia   Major depression, recurrent, chronic (HCC)   Hyponatremia   Drug overdose, intentional, initial encounter (HCC)   Anxiety   Overdose   MDD (major depressive disorder), recurrent severe, without psychosis (HCC)   Intentional drug overdose (HCC)   Assessment and Plan: New onset atrial fibrillation with RVR (HCC) This is in the setting of overdose with trazodone and alcohol.  Has been seen by cardiology, does not recommend anticoagulation. Patient has converted to sinus.   Suicidal behavior with attempted self-injury [facility]) Patient is seen by psychiatry, recommended psych admission, currently pending bed assignment. Patient is medically cleared for transfer.   Hypokalemia Resolved.   Uncontrolled type 2 diabetes mellitus with hyperglycemia, with long-term current use of insulin (HCC) Continue current treatment.   Anxiety - The patient is on Xanax.   Major depression, recurrent, chronic (HCC) - Management per psychiatry.   Morbid obesity with BMI 39.71 with comorbidities. Diet exercise  Chronic back pain. Patient has a worsening back pain due to hospital bed.   Will give Percocet for now.    Subjective:  Patient is complaining of severe low back pain, she has chronic back pain with a prior surgery.  Back pain is worse after sleeping in the bed in the hospital.  Physical Exam: Vitals:   09/22/23 2325 09/23/23 0402 09/23/23 0815 09/23/23 1056  BP: 134/76 135/74 (!) 140/73 93/66  Pulse: 74 64 70 66  Resp: 17 19 17 18   Temp: 98.4 F (36.9 C) 98.2 F (36.8 C) 97.8 F (36.6 C) 97.6 F (36.4 C)  TempSrc: Oral Oral Oral Oral  SpO2: 96% 92% 94% 91%  Weight:      Height:       General exam: Appears calm and comfortable, morbidly obese. Respiratory system: Clear to auscultation. Respiratory effort normal. Cardiovascular system: S1 & S2 heard, RRR. No JVD, murmurs, rubs, gallops or clicks. No pedal edema. Gastrointestinal system: Abdomen is nondistended, soft and nontender. No organomegaly or masses felt. Normal bowel sounds heard. Central nervous system: Alert and oriented. No focal neurological deficits. Extremities: Symmetric 5 x 5 power. Skin: No rashes, lesions or ulcers Psychiatry: Judgement and insight appear normal. Mood & affect appropriate.    Data Reviewed:  Lab results reviewed.  Family Communication: None  Disposition: Status is: Inpatient Remains inpatient appropriate because: Unsafe discharge, pending psych transfer.     Time spent: 35 minutes  Author: Marrion Coy, MD 09/23/2023 2:04 PM  For on call review [email or web address].

## 2023-09-23 NOTE — ED Notes (Signed)
Pt received breakfast tray 

## 2023-09-23 NOTE — ED Notes (Signed)
Pt. Alert and oriented, warm and dry, in no distress. Patient given phone to speak to husband. Pt. Encouraged to let nursing staff know of any concerns or needs.

## 2023-09-23 NOTE — ED Notes (Signed)
Report can be called to [phone] and she can arrive at 0830 on 12/15 due to inability to transport. Accepting physician Dr Octavia Bruckner. Pls ensure the day TTS/primary RN is aware in the morning. Thanks.

## 2023-09-24 ENCOUNTER — Other Ambulatory Visit: Payer: Self-pay

## 2023-09-24 ENCOUNTER — Encounter (HOSPITAL_COMMUNITY): Payer: Self-pay | Admitting: Psychiatry

## 2023-09-24 ENCOUNTER — Inpatient Hospital Stay (HOSPITAL_COMMUNITY)
Admission: AD | Admit: 2023-09-24 | Discharge: 2023-09-27 | DRG: 885 | Disposition: A | Discharge status: Home / Self Care | Payer: Medicaid Other | Source: Intra-hospital | Attending: Psychiatry | Admitting: Psychiatry

## 2023-09-24 DIAGNOSIS — T1491XA Suicide attempt, initial encounter: Secondary | ICD-10-CM | POA: Diagnosis not present

## 2023-09-24 DIAGNOSIS — Z62810 Personal history of physical and sexual abuse in childhood: Secondary | ICD-10-CM | POA: Diagnosis not present

## 2023-09-24 DIAGNOSIS — Z79899 Other long term (current) drug therapy: Secondary | ICD-10-CM

## 2023-09-24 DIAGNOSIS — J4489 Other specified chronic obstructive pulmonary disease: Secondary | ICD-10-CM | POA: Diagnosis present

## 2023-09-24 DIAGNOSIS — F17200 Nicotine dependence, unspecified, uncomplicated: Secondary | ICD-10-CM | POA: Diagnosis present

## 2023-09-24 DIAGNOSIS — F332 Major depressive disorder, recurrent severe without psychotic features: Secondary | ICD-10-CM | POA: Diagnosis present

## 2023-09-24 DIAGNOSIS — Z794 Long term (current) use of insulin: Secondary | ICD-10-CM | POA: Diagnosis not present

## 2023-09-24 DIAGNOSIS — E1165 Type 2 diabetes mellitus with hyperglycemia: Secondary | ICD-10-CM

## 2023-09-24 DIAGNOSIS — J0110 Acute frontal sinusitis, unspecified: Secondary | ICD-10-CM

## 2023-09-24 DIAGNOSIS — F339 Major depressive disorder, recurrent, unspecified: Secondary | ICD-10-CM

## 2023-09-24 DIAGNOSIS — G8929 Other chronic pain: Secondary | ICD-10-CM | POA: Diagnosis present

## 2023-09-24 DIAGNOSIS — I4891 Unspecified atrial fibrillation: Secondary | ICD-10-CM | POA: Diagnosis not present

## 2023-09-24 DIAGNOSIS — Z5986 Financial insecurity: Secondary | ICD-10-CM | POA: Diagnosis not present

## 2023-09-24 DIAGNOSIS — Z818 Family history of other mental and behavioral disorders: Secondary | ICD-10-CM

## 2023-09-24 DIAGNOSIS — I10 Essential (primary) hypertension: Secondary | ICD-10-CM | POA: Diagnosis present

## 2023-09-24 DIAGNOSIS — F411 Generalized anxiety disorder: Secondary | ICD-10-CM | POA: Diagnosis present

## 2023-09-24 DIAGNOSIS — F17210 Nicotine dependence, cigarettes, uncomplicated: Secondary | ICD-10-CM | POA: Diagnosis present

## 2023-09-24 LAB — CBG MONITORING, ED: Glucose-Capillary: 178 mg/dL — ABNORMAL HIGH (ref 70–99)

## 2023-09-24 LAB — GLUCOSE, CAPILLARY
Glucose-Capillary: 189 mg/dL — ABNORMAL HIGH (ref 70–99)
Glucose-Capillary: 125 mg/dL — ABNORMAL HIGH (ref 70–99)
Glucose-Capillary: 107 mg/dL — ABNORMAL HIGH (ref 70–99)
Glucose-Capillary: 184 mg/dL — ABNORMAL HIGH (ref 70–99)

## 2023-09-24 MED ORDER — TRAZODONE HCL 50 MG PO TABS
25.0000 mg | ORAL_TABLET | Freq: Every evening | ORAL | Status: DC | PRN
  Filled 2023-09-24: qty 1

## 2023-09-24 MED ORDER — DIPHENHYDRAMINE HCL 50 MG/ML IJ SOLN: 50.0000 mg | Freq: Three times a day (TID) | INTRAMUSCULAR | Status: DC | PRN

## 2023-09-24 MED ORDER — POLYETHYLENE GLYCOL 3350 17 G PO PACK: 17.0000 g | PACK | Freq: Every day | ORAL | Status: DC | PRN

## 2023-09-24 MED ORDER — NICOTINE 14 MG/24HR TD PT24
14.0000 mg | MEDICATED_PATCH | Freq: Every day | TRANSDERMAL | Status: DC
  Administered 2023-09-25 – 2023-09-27 (×3): 14 mg via TRANSDERMAL
  Filled 2023-09-24 (×5): qty 1

## 2023-09-24 MED ORDER — HALOPERIDOL LACTATE 5 MG/ML IJ SOLN: 10.0000 mg | Freq: Three times a day (TID) | INTRAMUSCULAR | Status: DC | PRN

## 2023-09-24 MED ORDER — HALOPERIDOL LACTATE 5 MG/ML IJ SOLN: 5.0000 mg | Freq: Three times a day (TID) | INTRAMUSCULAR | Status: DC | PRN

## 2023-09-24 MED ORDER — INSULIN GLARGINE-YFGN 100 UNIT/ML ~~LOC~~ SOLN
16.0000 [IU] | Freq: Every day | SUBCUTANEOUS | Status: DC
  Administered 2023-09-24 – 2023-09-26 (×3): 16 [IU] via SUBCUTANEOUS

## 2023-09-24 MED ORDER — INSULIN DETEMIR 100 UNIT/ML FLEXPEN: 8.0000 [IU] | PEN_INJECTOR | Freq: Two times a day (BID) | SUBCUTANEOUS | 0 refills | Status: DC

## 2023-09-24 MED ORDER — INSULIN ASPART 100 UNIT/ML IJ SOLN: 0.0000 [IU] | Freq: Every day | INTRAMUSCULAR | Status: DC

## 2023-09-24 MED ORDER — ACETAMINOPHEN 325 MG PO TABS
650.0000 mg | ORAL_TABLET | Freq: Four times a day (QID) | ORAL | Status: DC | PRN
  Administered 2023-09-25: 650 mg via ORAL
  Filled 2023-09-24: qty 2

## 2023-09-24 MED ORDER — ACETAMINOPHEN 650 MG RE SUPP: 650.0000 mg | Freq: Four times a day (QID) | RECTAL | Status: DC | PRN

## 2023-09-24 MED ORDER — GABAPENTIN 300 MG PO CAPS
300.0000 mg | ORAL_CAPSULE | Freq: Three times a day (TID) | ORAL | Status: DC
  Administered 2023-09-24 – 2023-09-27 (×9): 300 mg via ORAL
  Filled 2023-09-24 (×15): qty 1

## 2023-09-24 MED ORDER — LORATADINE 10 MG PO TABS
10.0000 mg | ORAL_TABLET | Freq: Every day | ORAL | Status: DC
  Administered 2023-09-24 – 2023-09-27 (×4): 10 mg via ORAL
  Filled 2023-09-24 (×6): qty 1

## 2023-09-24 MED ORDER — DIPHENHYDRAMINE HCL 25 MG PO CAPS: 50.0000 mg | ORAL_CAPSULE | Freq: Three times a day (TID) | ORAL | Status: DC | PRN

## 2023-09-24 MED ORDER — ALBUTEROL SULFATE (2.5 MG/3ML) 0.083% IN NEBU: 2.5000 mg | INHALATION_SOLUTION | Freq: Four times a day (QID) | RESPIRATORY_TRACT | Status: DC | PRN

## 2023-09-24 MED ORDER — INSULIN ASPART 100 UNIT/ML IJ SOLN
0.0000 [IU] | Freq: Three times a day (TID) | INTRAMUSCULAR | Status: DC
  Administered 2023-09-25: 5 [IU] via SUBCUTANEOUS
  Administered 2023-09-25: 2 [IU] via SUBCUTANEOUS
  Administered 2023-09-25: 3 [IU] via SUBCUTANEOUS
  Administered 2023-09-26: 2 [IU] via SUBCUTANEOUS
  Administered 2023-09-26: 5 [IU] via SUBCUTANEOUS
  Administered 2023-09-26 – 2023-09-27 (×2): 3 [IU] via SUBCUTANEOUS

## 2023-09-24 MED ORDER — INSULIN ASPART 100 UNIT/ML IJ SOLN
5.0000 [IU] | Freq: Three times a day (TID) | INTRAMUSCULAR | Status: DC
  Administered 2023-09-24 – 2023-09-27 (×8): 5 [IU] via SUBCUTANEOUS

## 2023-09-24 MED ORDER — ALPRAZOLAM 0.5 MG PO TABS
0.2500 mg | ORAL_TABLET | Freq: Two times a day (BID) | ORAL | Status: DC
  Administered 2023-09-24 – 2023-09-25 (×2): 0.25 mg via ORAL
  Filled 2023-09-24 (×2): qty 1

## 2023-09-24 MED ORDER — MAGNESIUM HYDROXIDE 400 MG/5ML PO SUSP
30.0000 mL | Freq: Every day | ORAL | Status: DC | PRN
  Administered 2023-09-24: 30 mL via ORAL
  Filled 2023-09-24: qty 30

## 2023-09-24 MED ORDER — ONDANSETRON HCL 4 MG/2ML IJ SOLN: 4.0000 mg | Freq: Four times a day (QID) | INTRAMUSCULAR | Status: DC | PRN

## 2023-09-24 MED ORDER — ACETAMINOPHEN 325 MG PO TABS: 650.0000 mg | ORAL_TABLET | Freq: Four times a day (QID) | ORAL | Status: DC | PRN

## 2023-09-24 MED ORDER — ALUM & MAG HYDROXIDE-SIMETH 200-200-20 MG/5ML PO SUSP: 30.0000 mL | ORAL | Status: DC | PRN

## 2023-09-24 MED ORDER — LORAZEPAM 2 MG/ML IJ SOLN: 1.0000 mg | INTRAMUSCULAR | Status: DC | PRN

## 2023-09-24 MED ORDER — HALOPERIDOL 5 MG PO TABS: 5.0000 mg | ORAL_TABLET | Freq: Three times a day (TID) | ORAL | Status: DC | PRN

## 2023-09-24 MED ORDER — LORAZEPAM 2 MG/ML IJ SOLN: 2.0000 mg | Freq: Three times a day (TID) | INTRAMUSCULAR | Status: DC | PRN

## 2023-09-24 MED ORDER — INSULIN ASPART 100 UNIT/ML IJ SOLN
8.0000 [IU] | Freq: Once | INTRAMUSCULAR | Status: AC
  Administered 2023-09-24: 8 [IU] via SUBCUTANEOUS

## 2023-09-24 MED ORDER — FLUTICASONE PROPIONATE 50 MCG/ACT NA SUSP: 2.0000 | Freq: Every day | NASAL | Status: DC | PRN

## 2023-09-24 MED ORDER — OXYCODONE-ACETAMINOPHEN 5-325 MG PO TABS
1.0000 | ORAL_TABLET | Freq: Three times a day (TID) | ORAL | Status: DC | PRN
  Administered 2023-09-24 – 2023-09-25 (×3): 1 via ORAL
  Filled 2023-09-24 (×3): qty 1

## 2023-09-24 MED ORDER — BUPROPION HCL ER (XL) 150 MG PO TB24
150.0000 mg | ORAL_TABLET | Freq: Every day | ORAL | Status: DC
  Administered 2023-09-24 – 2023-09-27 (×4): 150 mg via ORAL
  Filled 2023-09-24 (×6): qty 1

## 2023-09-24 MED ORDER — ONDANSETRON HCL 4 MG PO TABS: 4.0000 mg | ORAL_TABLET | Freq: Four times a day (QID) | ORAL | Status: DC | PRN

## 2023-09-24 MED ORDER — TRAZODONE HCL 150 MG PO TABS
300.0000 mg | ORAL_TABLET | Freq: Every day | ORAL | Status: DC
Start: 2023-09-24 — End: 2023-09-25
  Filled 2023-09-24 (×3): qty 2

## 2023-09-24 MED ORDER — LIDOCAINE 5 % EX PTCH: 1.0000 | MEDICATED_PATCH | CUTANEOUS | 0 refills | Status: DC

## 2023-09-24 MED ORDER — LIDOCAINE 5 % EX PTCH
1.0000 | MEDICATED_PATCH | CUTANEOUS | Status: DC
  Administered 2023-09-24 – 2023-09-26 (×3): 1 via TRANSDERMAL
  Filled 2023-09-24 (×4): qty 1

## 2023-09-24 MED ORDER — INSULIN ASPART 100 UNIT/ML IJ SOLN: 5.0000 [IU] | Freq: Once | INTRAMUSCULAR | Status: DC

## 2023-09-24 NOTE — Progress Notes (Signed)
Patient is a 57 year old female who presented to [facility] from [facility] under IVC after an intentional overdose with Trazodone mixed with alcohol. Pt has a medical hx significant for COPD, osteoarthritis, and DM.  Pt reported that she was having an argument with her husband concerning her daughter, and impulsively took the pills. Pt reported that her and her husband were intoxicated at the time. Pt reported that she usually drinks once a week, and when she does, she typically has either 3 glasses of wine or 5-6 beers. Pt denies using illicit drugs. Pt currently denies SI/HI and A/VH . Pt presented disheveled, malodorous, was friendly,calm and cooperative, answered questions logically and coherently during admission interview and assessment.Admission paperwork completed and signed. Verbal understanding expressed.  VS monitored and recorded. Skin check performed with other RN.  Patient was oriented to unit and schedule. Q 15 min checks initiated for safety. CBG (189)obtained and lunch tray given to pt. Order received from Provider to give coverage.

## 2023-09-24 NOTE — Progress Notes (Signed)
Christy Mayer did not attend wrap up group

## 2023-09-24 NOTE — Tx Team (Addendum)
Initial Treatment Plan 09/24/2023 4:13 PM Christy Mayer UKG:[id number]    PATIENT STRESSORS: Financial difficulties   Marital or family conflict   Substance abuse     PATIENT STRENGTHS: Average or above average intelligence  Communication skills  Motivation for treatment/growth  Supportive family/friends    PATIENT IDENTIFIED PROBLEMS: Intentional OD- suicidal ideation    Panic attacks    Depression             DISCHARGE CRITERIA:  Improved stabilization in mood, thinking, and/or behavior Reduction of life-threatening or endangering symptoms to within safe limits Verbal commitment to aftercare and medication compliance Withdrawal symptoms are absent or subacute and managed without 24-hour nursing intervention  PRELIMINARY DISCHARGE PLAN: Attend 12-step recovery group Outpatient therapy Return to previous living arrangement  PATIENT/FAMILY INVOLVEMENT: This treatment plan has been presented to and reviewed with the patient, Christy Mayer, Christy Mayer patient has been given the opportunity to ask questions and make suggestions.  Shela Nevin, RN 09/24/2023, 4:13 PM

## 2023-09-24 NOTE — ED Notes (Signed)
Pt ambulatory to restroom

## 2023-09-24 NOTE — ED Notes (Signed)
Breakfast tray provided for pt.  

## 2023-09-24 NOTE — [facility] Group Notes (Signed)
Type of Therapy and Topic:  Group Therapy: Mindful vs Mind Full  Participation Level: Did Not Attend   Description of Group:   In this group, patients shared and discussed the importance of acknowledging the elements in their lives for when they are mindful and mind full and how this can positively impact their mood.  The group discussed how being mindful of their surroundings can benefit the decisions they make and how it can affect others. The group also discussed how being mind full can impact the way decisions are ineffective. An exercise was done as a group in which a list was made of mindfulness items in order to encourage participants to consider other potential positives in their lives.  Therapeutic Goals: Patients will identify one or more item for which they are mindfulness living through their day and who it can affect:  people, experiences, things, places, skills, and other. Patients will discuss how it is possible to apply a mindful mind to an event in their life. Patients will explore other possible items of a mindful vs mind full that they could remember.    Summary of Patient Progress:  NA, patient in after group  Therapeutic Modalities:   Solution-Focused Therapy

## 2023-09-24 NOTE — Plan of Care (Signed)
  Problem: Education: Goal: Knowledge of [facility] Education information/materials will improve Outcome: Progressing Goal: Verbalization of understanding the information provided will improve Outcome: Progressing   

## 2023-09-24 NOTE — Group Note (Unsigned)
Date:  09/24/2023 Time:  9:36 PM  Group Topic/Focus:  Wrap-Up Group:   The focus of this group is to help patients review their daily goal of treatment and discuss progress on daily workbooks.     Participation Level:  {B[facility] PARTICIPATION UEAVW:09811}  Participation Quality:  {BHH PARTICIPATION QUALITY:22265}  Affect:  {BHH AFFECT:22266}  Cognitive:  {BHH COGNITIVE:22267}  Insight: {BHH Insight2:20797}  Engagement in Group:  {BHH ENGAGEMENT IN BJYNW:29562}  Modes of Intervention:  {BHH MODES OF INTERVENTION:22269}  Additional Comments:  ***  Christy Mayer 09/24/2023, 9:36 PM

## 2023-09-24 NOTE — B[facility] Group Notes (Signed)
Pt did not attend music therapy

## 2023-09-24 NOTE — Discharge Summary (Signed)
Physician Discharge Summary   Patient: Christy Mayer MRN: [id number] DOB: June 06, 1966  Admit date:     09/21/2023  Discharge date: 09/24/23  Discharge Physician: Marrion Coy   PCP: Smitty Cords, DO   Recommendations at discharge:   Follow-up with PCP after discharge from mental health.  Discharge Diagnoses: Principal Problem:   Atrial fibrillation with RVR (HCC) Active Problems:   Suicidal behavior with attempted self-injury (HCC)   Uncontrolled type 2 diabetes mellitus with hyperglycemia, with long-term current use of insulin (HCC)   Hypokalemia   Major depression, recurrent, chronic (HCC)   Hyponatremia   Drug overdose, intentional, initial encounter (HCC)   Anxiety   Overdose   MDD (major depressive disorder), recurrent severe, without psychosis (HCC)   Intentional drug overdose (HCC)  Resolved Problems:   * No resolved hospital problems. *  Hospital Course: Christy Mayer is a 57 y.o. Caucasian female with medical history significant for COPD, mitral valve prolapse, asthma and osteoarthritis, presented to the emergency room with acute onset of overdose with trazodone together with alcohol.  She was monitored overnight, condition has improved.  Patient has been evaluated by psychiatry, recommend psychiatry admission.  Patient will be transferred to psychiatry today.  Assessment and Plan:  New onset atrial fibrillation with RVR (HCC) This is in the setting of overdose with trazodone and alcohol.  Has been seen by cardiology, does not recommend anticoagulation. Patient has converted to sinus.   Suicidal behavior with attempted self-injury [facility]) Patient is seen by psychiatry, recommended psych admission.  Patient is medically cleared for transfer.   Hypokalemia Resolved.   Uncontrolled type 2 diabetes mellitus with hyperglycemia, with long-term current use of insulin (HCC) Resume home regimen.   Anxiety - The patient is on Xanax.   Major depression,  recurrent, chronic (HCC) - Management per psychiatry.   Morbid obesity with BMI 39.71 with comorbidities. Diet exercise   Chronic back pain. Continue Lidoderm patch.      Consultants: Psych Procedures performed: None  Disposition:  psych unit Diet recommendation:  Discharge Diet Orders (From admission, onward)     Start     Ordered   09/24/23 0000  Diet - low sodium heart healthy        09/24/23 0803           Cardiac diet DISCHARGE MEDICATION: Allergies as of 09/24/2023       Reactions   Aspirin Nausea Only        Medication List     STOP taking these medications    budesonide-formoterol 160-4.5 MCG/ACT inhaler Commonly known as: Symbicort   fluticasone 50 MCG/ACT nasal spray Commonly known as: FLONASE   loratadine 10 MG tablet Commonly known as: CLARITIN   ondansetron 4 MG disintegrating tablet Commonly known as: Zofran ODT   Ubrelvy 50 MG Tabs Generic drug: Ubrogepant   Ventolin HFA 108 (90 Base) MCG/ACT inhaler Generic drug: albuterol       TAKE these medications    Accu-Chek Guide Control Liqd Use for glucometer   Accu-Chek Guide test strip Generic drug: glucose blood Use to check blood sugar up to 2 times daily   Accu-Chek Guide w/Device Kit Use to check blood sugar up to 2 x daily   Accu-Chek Softclix Lancets lancets Check sugar up to 2 times daily   Alcohol Swabs Pads 1 Dose by Does not apply route 3 (three) times daily before meals.   ALPRAZolam 0.25 MG tablet Commonly known as: XANAX TAKE 1 TABLET(0.25 MG)  BY MOUTH TWICE DAILY   buPROPion 150 MG 24 hr tablet Commonly known as: WELLBUTRIN XL Take 1 tablet (150 mg total) by mouth daily.   gabapentin 300 MG capsule Commonly known as: NEURONTIN TAKE 1 CAPSULE(300 MG) BY MOUTH THREE TIMES DAILY   insulin aspart 100 UNIT/ML FlexTouch Pen Commonly known as: FIASP 6 units prior to meals (okay to switch to any short acting insulin that is covered)   insulin detemir 100  UNIT/ML FlexPen Commonly known as: LEVEMIR Inject 8 Units into the skin 2 (two) times daily. 20 units subcutaneous injection (okay to switch to any long acting insulin that is covered) What changed:  how much to take how to take this when to take this   Insulin Pen Needle 33G X 5 MM Misc 1 Dose by Does not apply route 3 (three) times daily before meals.   lidocaine 5 % Commonly known as: LIDODERM Place 1 patch onto the skin daily. Remove & Discard patch within 12 hours or as directed by MD   metFORMIN 500 MG tablet Commonly known as: GLUCOPHAGE Take 2 tablets (1,000 mg total) by mouth 2 (two) times daily with a meal.   nicotine 14 mg/24hr patch Commonly known as: NICODERM CQ - dosed in mg/24 hours One patch chest wall daily (substitute generic)   polyethylene glycol 17 g packet Commonly known as: MIRALAX / GLYCOLAX Take 17 g by mouth daily as needed for moderate constipation.   traZODone 50 MG tablet Commonly known as: DESYREL Take 6 tablets (300 mg total) by mouth at bedtime.   Trulicity 0.75 MG/0.5ML Soaj Generic drug: Dulaglutide Inject 0.75 mg into the skin once a week.        Follow-up Information     Smitty Cords, DO Follow up in 2 week(s).   Specialty: Family Medicine Contact information: [address] [place] [address] [phone]                Discharge Exam: Ceasar Mons Weights   09/21/23 0038  Weight: 115 kg   General exam: Appears calm and comfortable, morbid obese Respiratory system: Clear to auscultation. Respiratory effort normal. Cardiovascular system: S1 & S2 heard, RRR. No JVD, murmurs, rubs, gallops or clicks. No pedal edema. Gastrointestinal system: Abdomen is nondistended, soft and nontender. No organomegaly or masses felt. Normal bowel sounds heard. Central nervous system: Alert and oriented. No focal neurological deficits. Extremities: Symmetric 5 x 5 power. Skin: No rashes, lesions or ulcers Psychiatry: Judgement and  insight appear normal. Mood & affect appropriate.    Condition at discharge: good  The results of significant diagnostics from this hospitalization (including imaging, microbiology, ancillary and laboratory) are listed below for reference.   Imaging Studies: ECHOCARDIOGRAM COMPLETE Result Date: 09/22/2023    ECHOCARDIOGRAM REPORT   Patient Name:   Christy Mayer Date of Exam: 09/22/2023 Medical Rec #:  [id number]       Height:       67.0 in Accession #:    [id number]      Weight:       253.5 lb Date of Birth:  May 11, 1966      BSA:          2.237 m Patient Age:    57 years        BP:           117/64 mmHg Patient Gender: F               HR:  70 bpm. Exam Location:  [facility] Procedure: 2D Echo, Cardiac Doppler and Color Doppler Indications:     Atrial Fibrillation  History:         Patient has no prior history of Echocardiogram examinations.                  COPD, Arrythmias:Atrial Fibrillation, Signs/Symptoms:Dyspnea;                  Risk Factors:Hypertension, Diabetes, Dyslipidemia and Current                  Smoker.  Sonographer:     Mikki Harbor Referring Phys:  [id number] CARALYN HUDSON Diagnosing Phys: Alwyn Pea MD IMPRESSIONS  1. Left ventricular ejection fraction, by estimation, is 65 to 70%. The left ventricle has normal function. The left ventricle has no regional wall motion abnormalities. Left ventricular diastolic parameters were normal.  2. Right ventricular systolic function is normal. The right ventricular size is normal. There is normal pulmonary artery systolic pressure.  3. The mitral valve is normal in structure. No evidence of mitral valve regurgitation.  4. The aortic valve is normal in structure. Aortic valve regurgitation is mild. Aortic valve sclerosis is present, with no evidence of aortic valve stenosis. FINDINGS  Left Ventricle: Left ventricular ejection fraction, by estimation, is 65 to 70%. The left ventricle has normal function. The left ventricle has no  regional wall motion abnormalities. The left ventricular internal cavity size was normal in size. There is  no left ventricular hypertrophy. Left ventricular diastolic parameters were normal. Right Ventricle: The right ventricular size is normal. No increase in right ventricular wall thickness. Right ventricular systolic function is normal. There is normal pulmonary artery systolic pressure. The tricuspid regurgitant velocity is 2.33 m/s, and  with an assumed right atrial pressure of 3 mmHg, the estimated right ventricular systolic pressure is 24.7 mmHg. Left Atrium: Left atrial size was normal in size. Right Atrium: Right atrial size was normal in size. Pericardium: There is no evidence of pericardial effusion. Mitral Valve: The mitral valve is normal in structure. No evidence of mitral valve regurgitation. MV peak gradient, 3.2 mmHg. The mean mitral valve gradient is 1.0 mmHg. Tricuspid Valve: The tricuspid valve is normal in structure. Tricuspid valve regurgitation is not demonstrated. Aortic Valve: The aortic valve is normal in structure. Aortic valve regurgitation is mild. Aortic valve sclerosis is present, with no evidence of aortic valve stenosis. Aortic valve mean gradient measures 6.0 mmHg. Aortic valve peak gradient measures 12.8 mmHg. Aortic valve area, by VTI measures 2.93 cm. Pulmonic Valve: The pulmonic valve was normal in structure. Pulmonic valve regurgitation is not visualized. Aorta: The ascending aorta was not well visualized. IAS/Shunts: No atrial level shunt detected by color flow Doppler.  LEFT VENTRICLE PLAX 2D LVIDd:         4.60 cm     Diastology LVIDs:         2.90 cm     LV e' medial:    10.30 cm/s LV PW:         1.40 cm     LV E/e' medial:  8.6 LV IVS:        1.10 cm     LV e' lateral:   13.70 cm/s LVOT diam:     2.00 cm     LV E/e' lateral: 6.5 LV SV:         94 LV SV Index:   42 LVOT Area:  3.14 cm  LV Volumes (MOD) LV vol d, MOD A2C: 69.6 ml LV vol d, MOD A4C: 95.5 ml LV vol s,  MOD A2C: 28.5 ml LV vol s, MOD A4C: 32.5 ml LV SV MOD A2C:     41.1 ml LV SV MOD A4C:     95.5 ml LV SV MOD BP:      51.6 ml RIGHT VENTRICLE RV Basal diam:  3.50 cm RV Mid diam:    2.90 cm RV S prime:     15.90 cm/s TAPSE (M-mode): 2.4 cm LEFT ATRIUM             Index        RIGHT ATRIUM           Index LA diam:        4.20 cm 1.88 cm/m   RA Area:     19.60 cm LA Vol (A2C):   80.6 ml 36.04 ml/m  RA Volume:   56.10 ml  25.08 ml/m LA Vol (A4C):   53.7 ml 24.01 ml/m LA Biplane Vol: 68.6 ml 30.67 ml/m  AORTIC VALVE                     PULMONIC VALVE AV Area (Vmax):    2.56 cm      PV Vmax:       0.87 m/s AV Area (Vmean):   2.52 cm      PV Peak grad:  3.0 mmHg AV Area (VTI):     2.93 cm AV Vmax:           179.00 cm/s AV Vmean:          115.000 cm/s AV VTI:            0.322 m AV Peak Grad:      12.8 mmHg AV Mean Grad:      6.0 mmHg LVOT Vmax:         146.00 cm/s LVOT Vmean:        92.400 cm/s LVOT VTI:          0.300 m LVOT/AV VTI ratio: 0.93  AORTA Ao Root diam: 3.30 cm MITRAL VALVE               TRICUSPID VALVE MV Area (PHT): 3.10 cm    TR Peak grad:   21.7 mmHg MV Area VTI:   3.01 cm    TR Vmax:        233.00 cm/s MV Peak grad:  3.2 mmHg MV Mean grad:  1.0 mmHg    SHUNTS MV Vmax:       0.90 m/s    Systemic VTI:  0.30 m MV Vmean:      50.1 cm/s   Systemic Diam: 2.00 cm MV Decel Time: 245 msec MV E velocity: 88.60 cm/s MV A velocity: 88.60 cm/s MV E/A ratio:  1.00 Alwyn Pea MD Electronically signed by Alwyn Pea MD Signature Date/Time: 09/22/2023/9:44:39 PM    Final    DG Chest Port 1 View Result Date: 09/21/2023 CLINICAL DATA:  Recent EXAM: PORTABLE CHEST 1 VIEW COMPARISON:  02/05/2022 FINDINGS: The heart size and mediastinal contours are within normal limits. Both lungs are clear. The visualized skeletal structures are unremarkable. IMPRESSION: No active disease. Electronically Signed   By: Alcide Clever M.D.   On: 09/21/2023 00:49    Microbiology: Results for orders placed or performed  during the hospital encounter of 01/26/22  Resp Panel by RT-PCR (Flu A&B,  Covid) Nasopharyngeal Swab     Status: None   Collection Time: 01/26/22  2:00 PM   Specimen: Nasopharyngeal Swab; Nasopharyngeal(NP) swabs in vial transport medium  Result Value Ref Range Status   SARS Coronavirus 2 by RT PCR NEGATIVE NEGATIVE Final    Comment: (NOTE) SARS-CoV-2 target nucleic acids are NOT DETECTED.  The SARS-CoV-2 RNA is generally detectable in upper respiratory specimens during the acute phase of infection. The lowest concentration of SARS-CoV-2 viral copies this assay can detect is 138 copies/mL. A negative result does not preclude SARS-Cov-2 infection and should not be used as the sole basis for treatment or other patient management decisions. A negative result may occur with  improper specimen collection/handling, submission of specimen other than nasopharyngeal swab, presence of viral mutation(s) within the areas targeted by this assay, and inadequate number of viral copies(<138 copies/mL). A negative result must be combined with clinical observations, patient history, and epidemiological information. The expected result is Negative.  Fact Sheet for Patients:  BloggerCourse.com  Fact Sheet for Healthcare Providers:  SeriousBroker.it  This test is no t yet approved or cleared by the [place] FDA and  has been authorized for detection and/or diagnosis of SARS-CoV-2 by FDA under an Emergency Use Authorization (EUA). This EUA will remain  in effect (meaning this test can be used) for the duration of the COVID-19 declaration under Section 564(b)(1) of the Act, 21 U.S.C.section 360bbb-3(b)(1), unless the authorization is terminated  or revoked sooner.       Influenza A by PCR NEGATIVE NEGATIVE Final   Influenza B by PCR NEGATIVE NEGATIVE Final    Comment: (NOTE) The Xpert Xpress SARS-CoV-2/FLU/RSV plus assay is intended as an  aid in the diagnosis of influenza from Nasopharyngeal swab specimens and should not be used as a sole basis for treatment. Nasal washings and aspirates are unacceptable for Xpert Xpress SARS-CoV-2/FLU/RSV testing.  Fact Sheet for Patients: BloggerCourse.com  Fact Sheet for Healthcare Providers: SeriousBroker.it  This test is not yet approved or cleared by the [place] FDA and has been authorized for detection and/or diagnosis of SARS-CoV-2 by FDA under an Emergency Use Authorization (EUA). This EUA will remain in effect (meaning this test can be used) for the duration of the COVID-19 declaration under Section 564(b)(1) of the Act, 21 U.S.C. section 360bbb-3(b)(1), unless the authorization is terminated or revoked.  Performed at [facility], [address] [place], [place] [address]   Culture, blood (Routine X 2) w Reflex to ID Panel     Status: None   Collection Time: 01/26/22  3:41 PM   Specimen: Right Antecubital; Blood  Result Value Ref Range Status   Specimen Description RIGHT ANTECUBITAL  Final   Special Requests   Final    BOTTLES DRAWN AEROBIC AND ANAEROBIC Blood Culture adequate volume   Culture   Final    NO GROWTH 5 DAYS Performed at [facility], [address] [place], [place] [address]    Report Status 01/31/2022 FINAL  Final  Culture, blood (Routine X 2) w Reflex to ID Panel     Status: None   Collection Time: 01/26/22  5:46 PM   Specimen: BLOOD  Result Value Ref Range Status   Specimen Description BLOOD RIGHT ARM  Final   Special Requests   Final    BOTTLES DRAWN AEROBIC AND ANAEROBIC Blood Culture adequate volume   Culture   Final    NO GROWTH 5 DAYS Performed at [facility], [address] [place], [place] [address]    Report Status 01/31/2022 FINAL  Final  Respiratory (~20 pathogens) panel by PCR     Status: None   Collection Time: 02/02/22  8:50 AM   Specimen:  Nasopharyngeal Swab; Respiratory  Result Value Ref Range Status   Adenovirus NOT DETECTED NOT DETECTED Final   Coronavirus 229E NOT DETECTED NOT DETECTED Final    Comment: (NOTE) The Coronavirus on the Respiratory Panel, DOES NOT test for the novel  Coronavirus (2019 nCoV)    Coronavirus HKU1 NOT DETECTED NOT DETECTED Final   Coronavirus NL63 NOT DETECTED NOT DETECTED Final   Coronavirus OC43 NOT DETECTED NOT DETECTED Final   Metapneumovirus NOT DETECTED NOT DETECTED Final   Rhinovirus / Enterovirus NOT DETECTED NOT DETECTED Final   Influenza A NOT DETECTED NOT DETECTED Final   Influenza B NOT DETECTED NOT DETECTED Final   Parainfluenza Virus 1 NOT DETECTED NOT DETECTED Final   Parainfluenza Virus 2 NOT DETECTED NOT DETECTED Final   Parainfluenza Virus 3 NOT DETECTED NOT DETECTED Final   Parainfluenza Virus 4 NOT DETECTED NOT DETECTED Final   Respiratory Syncytial Virus NOT DETECTED NOT DETECTED Final   Bordetella pertussis NOT DETECTED NOT DETECTED Final   Bordetella Parapertussis NOT DETECTED NOT DETECTED Final   Chlamydophila pneumoniae NOT DETECTED NOT DETECTED Final   Mycoplasma pneumoniae NOT DETECTED NOT DETECTED Final    Comment: Performed at [facility], [address] [place], [place] [address]  MRSA Next Gen by PCR, Nasal     Status: None   Collection Time: 02/02/22  8:50 AM   Specimen: Nasal Mucosa; Nasal Swab  Result Value Ref Range Status   MRSA by PCR Next Gen NOT DETECTED NOT DETECTED Final    Comment: (NOTE) The GeneXpert MRSA Assay (FDA approved for NASAL specimens only), is one component of a comprehensive MRSA colonization surveillance program. It is not intended to diagnose MRSA infection nor to guide or monitor treatment for MRSA infections. Test performance is not FDA approved in patients less than 9 years old. Performed at [facility], [address] [place], [place] [address]     Labs: CBC: Recent Labs  Lab 09/21/23 0053  09/22/23 0520  WBC 6.8 8.6  HGB 15.1* 12.7  HCT 43.7 38.0  MCV 96.0 99.5  PLT 189 189   Basic Metabolic Panel: Recent Labs  Lab 09/21/23 0053 09/21/23 1026 09/21/23 1904 09/22/23 0520  NA 133* 133*  --  134*  K 2.9* 5.4* 3.8 3.9  CL 96* 102  --  104  CO2 22 24  --  25  GLUCOSE 318* 278*  --  197*  BUN 15 15  --  12  CREATININE 0.66 0.72  --  0.65  CALCIUM 8.7* 8.2*  --  7.9*  MG  --  1.8  --  2.2   Liver Function Tests: Recent Labs  Lab 09/21/23 0053  AST 17  ALT 26  ALKPHOS 80  BILITOT 0.6  PROT 7.7  ALBUMIN 4.2   CBG: Recent Labs  Lab 09/23/23 0757 09/23/23 1137 09/23/23 1643 09/23/23 2108 09/24/23 0742  GLUCAP 187* 202* 120* 163* 178*    Discharge time spent: greater than 30 minutes.  Signed: Marrion Coy, MD [facility] 09/24/2023

## 2023-09-24 NOTE — ED Notes (Signed)
Pt ambulatory to restroom no other needs verbalized

## 2023-09-24 NOTE — ED Notes (Signed)
EMTALA Reviewed by this RN.  

## 2023-09-24 NOTE — [facility] Group Notes (Signed)
[facility] Group Notes:  (Nursing)  Date:  09/24/2023  Time:  1400  Type of Therapy:  Psychoeducational Skills  Participation Level:  Did Not Attend   Shela Nevin 09/24/2023, 4:46 PM

## 2023-09-25 ENCOUNTER — Encounter (HOSPITAL_COMMUNITY): Payer: Self-pay

## 2023-09-25 ENCOUNTER — Telehealth: Payer: Self-pay | Admitting: Pharmacist

## 2023-09-25 ENCOUNTER — Other Ambulatory Visit: Payer: Self-pay

## 2023-09-25 ENCOUNTER — Other Ambulatory Visit: Payer: Medicaid Other

## 2023-09-25 DIAGNOSIS — F332 Major depressive disorder, recurrent severe without psychotic features: Secondary | ICD-10-CM | POA: Diagnosis not present

## 2023-09-25 LAB — GLUCOSE, CAPILLARY
Glucose-Capillary: 148 mg/dL — ABNORMAL HIGH (ref 70–99)
Glucose-Capillary: 156 mg/dL — ABNORMAL HIGH (ref 70–99)
Glucose-Capillary: 223 mg/dL — ABNORMAL HIGH (ref 70–99)
Glucose-Capillary: 163 mg/dL — ABNORMAL HIGH (ref 70–99)

## 2023-09-25 MED ORDER — ALPRAZOLAM 0.5 MG PO TABS: 0.2500 mg | ORAL_TABLET | Freq: Two times a day (BID) | ORAL | Status: DC | PRN

## 2023-09-25 NOTE — [facility] Group Notes (Signed)
[facility] Group Notes:  (Nursing/MHT/Case Management/Adjunct)  Date:  09/25/2023  Time:  9:05 PM  Type of Therapy:   Wrap-up group  Participation Level:  Active  Participation Quality:  Appropriate  Affect:  Appropriate  Cognitive:  Appropriate  Insight:  Appropriate  Engagement in Group:  Engaged  Modes of Intervention:  Education  Summary of Progress/Problems: Goal to have a good day. Met goal. Rated day 10/10.  Christy Mayer 09/25/2023, 9:05 PM

## 2023-09-25 NOTE — [facility] Group Notes (Signed)

## 2023-09-25 NOTE — Plan of Care (Signed)
  Problem: Education: Goal: Emotional status will improve Outcome: Progressing Goal: Mental status will improve Outcome: Progressing Goal: Verbalization of understanding the information provided will improve Outcome: Progressing   Problem: Activity: Goal: Interest or engagement in activities will improve Outcome: Progressing Goal: Sleeping patterns will improve Outcome: Progressing   Problem: Coping: Goal: Ability to verbalize frustrations and anger appropriately will improve Outcome: Progressing Goal: Ability to demonstrate self-control will improve Outcome: Progressing   Problem: Safety: Goal: Periods of time without injury will increase Outcome: Progressing

## 2023-09-25 NOTE — Plan of Care (Signed)
  Problem: Education: Goal: Knowledge of [place] Education information/materials will improve Outcome: Progressing Goal: Emotional status will improve Outcome: Progressing Goal: Mental status will improve Outcome: Progressing   Problem: Activity: Goal: Sleeping patterns will improve Outcome: Progressing   Problem: Coping: Goal: Ability to verbalize frustrations and anger appropriately will improve Outcome: Progressing Goal: Ability to demonstrate self-control will improve Outcome: Progressing   Problem: Safety: Goal: Periods of time without injury will increase Outcome: Progressing   Problem: Activity: Goal: Interest or engagement in activities will improve Outcome: Not Progressing

## 2023-09-25 NOTE — Group Note (Signed)
Date:  09/25/2023 Time:  9:16 AM  Group Topic/Focus:  Goals Group:   The focus of this group is to help patients establish daily goals to achieve during treatment and discuss how the patient can incorporate goal setting into their daily lives to aide in recovery. Orientation:   The focus of this group is to educate the patient on the purpose and policies of crisis stabilization and provide a format to answer questions about their admission.  The group details unit policies and expectations of patients while admitted.    Participation Level:  Active  Participation Quality:  Appropriate  Affect:  Appropriate  Cognitive:  Appropriate  Insight: Appropriate  Engagement in Group:  Engaged  Modes of Intervention:  Discussion  Additional Comments:    Christy Mayer 09/25/2023, 9:16 AM

## 2023-09-25 NOTE — H&P (Addendum)
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 depressive disorder), recurrent severe, without psychosis (HCC) in order to reduce the risk of self-harm at discharge.  5. Discharge Planning:   -- Social work and case management to assist with discharge planning and identification of hospital follow-up needs prior to discharge  -- Estimated LOS: 2-3 days, possible discharge Wednesday   -- Discharge Concerns: Need to establish a safety plan; Medication compliance and effectiveness  -- Discharge Goals: Return home with outpatient referrals for mental health follow-up including medication management/psychotherapy  I certify that inpatient services furnished can reasonably be expected to improve the patient's condition.  Signed: Peterson Ao, MD 09/25/2023, 3:53 PM

## 2023-09-25 NOTE — Group Note (Signed)
Recreation Therapy Group Note   Group Topic:Stress Management  Group Date: 09/25/2023 Start Time: 0931 End Time: 0951 Facilitators: Jilene Spohr-McCall, LRT,CTRS Location: [address] Dayroom   Group Topic: Stress Management  Goal Area(s) Addresses:  Patient will identify positive stress management techniques. Patient will identify benefits of using stress management post d/c.  Group Description: Meditation. LRT engaged with patients about the benefits of meditation. LRT then played a meditation for patients that focused on self renewal and building confidence in ones self.    Education:  Stress Management, Discharge Planning.   Education Outcome: Acknowledges Education   Affect/Mood: N/A   Participation Level: Did not attend    Clinical Observations/Individualized Feedback:      Plan: Continue to engage patient in RT group sessions 2-3x/week.   Janeil Schexnayder-McCall, LRT,CTRS 09/25/2023 1:31 PM

## 2023-09-25 NOTE — Inpatient Diabetes Management (Signed)
Inpatient Diabetes Program Recommendations  AACE/ADA: New Consensus Statement on Inpatient Glycemic Control (2015)  Target Ranges:  Prepandial:   less than 140 mg/dL      Peak postprandial:   less than 180 mg/dL (1-2 hours)      Critically ill patients:  140 - 180 mg/dL   Lab Results  Component Value Date   GLUCAP 148 (H) 09/25/2023   HGBA1C 9.0 (H) 09/21/2023    Review of Glycemic Control  Diabetes history: DM2 Outpatient Diabetes medications: Levemir 8 BID, Fiasp 6 units TID (not taking), metformin 1000 mg BID (not taking) Current orders for Inpatient glycemic control: Semglee 16 units at bedtime, Novolog 0-15 TID with meals and 0-5 HS + 5 units TID  HgbA1C - 9.0%  Inpatient Diabetes Program Recommendations:    Agree with orders.  Will follow glucose trends.  Thank you. Ailene Ards, RD, LDN, CDCES Inpatient Diabetes Coordinator [phone]

## 2023-09-25 NOTE — Patient Outreach (Signed)
Patient currently inpatient.   Abelino Derrick, MHA [facility]  Managed [facility] Social Worker [phone]
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
## 2023-09-25 NOTE — Progress Notes (Addendum)
D. Pt presented with a bright affect, reported sleeping well last night, denied SI/HI and A/VH. Pt reported that she was ready to discharge, stating, "I'm over it now." A. Labs and vitals monitored. Pt given and educated on medications. Pt supported emotionally and encouraged to express concerns and ask questions.   R. Pt remains safe with 15 minute checks. Will continue POC.    09/25/23 0900  Psych Admission Type (Psych Patients Only)  Admission Status Involuntary  Psychosocial Assessment  Patient Complaints Anxiety  Eye Contact Fair  Facial Expression Animated  Affect Appropriate to circumstance  Speech Logical/coherent  Interaction Assertive  Motor Activity Slow  Appearance/Hygiene Improved  Behavior Characteristics Cooperative;Appropriate to situation  Mood Anxious;Pleasant  Thought Process  Coherency WDL  Content WDL  Delusions None reported or observed  Perception WDL  Hallucination None reported or observed  Judgment Impaired  Confusion WDL  Danger to Self  Current suicidal ideation? Denies  Self-Injurious Behavior No self-injurious ideation or behavior indicators observed or expressed   Danger to Others  Danger to Others None reported or observed

## 2023-09-25 NOTE — BH IP Treatment Plan (Signed)
Interdisciplinary Treatment and Diagnostic Plan Update  09/25/2023 Time of Session: 10:55 am Christy Mayer MRN: [id number]  Principal Diagnosis: MDD (major depressive disorder), recurrent severe, without psychosis (HCC)  Secondary Diagnoses: Principal Problem:   MDD (major depressive disorder), recurrent severe, without psychosis (HCC) Active Problems:   Tobacco use disorder   GAD (generalized anxiety disorder)   Current Medications:  Current Facility-Administered Medications  Medication Dose Route Frequency Provider Last Rate Last Admin   acetaminophen (TYLENOL) tablet 650 mg  650 mg Oral Q6H PRN Jearld Lesch, NP       Or   acetaminophen (TYLENOL) suppository 650 mg  650 mg Rectal Q6H PRN Dixon, Rashaun M, NP       albuterol (PROVENTIL) (2.5 MG/3ML) 0.083% nebulizer solution 2.5 mg  2.5 mg Nebulization Q6H PRN Dixon, Rashaun M, NP       alum & mag hydroxide-simeth (MAALOX/MYLANTA) 200-200-20 MG/5ML suspension 30 mL  30 mL Oral Q4H PRN Durwin Nora, Rashaun M, NP       buPROPion (WELLBUTRIN XL) 24 hr tablet 150 mg  150 mg Oral Daily Durwin Nora, Rashaun M, NP   150 mg at 09/25/23 6578   haloperidol (HALDOL) tablet 5 mg  5 mg Oral TID PRN Jearld Lesch, NP       And   diphenhydrAMINE (BENADRYL) capsule 50 mg  50 mg Oral TID PRN Jearld Lesch, NP       haloperidol lactate (HALDOL) injection 5 mg  5 mg Intramuscular TID PRN Jearld Lesch, NP       And   diphenhydrAMINE (BENADRYL) injection 50 mg  50 mg Intramuscular TID PRN Jearld Lesch, NP       And   LORazepam (ATIVAN) injection 2 mg  2 mg Intramuscular TID PRN Jearld Lesch, NP       haloperidol lactate (HALDOL) injection 10 mg  10 mg Intramuscular TID PRN Jearld Lesch, NP       And   diphenhydrAMINE (BENADRYL) injection 50 mg  50 mg Intramuscular TID PRN Jearld Lesch, NP       And   LORazepam (ATIVAN) injection 2 mg  2 mg Intramuscular TID PRN Durwin Nora, Rashaun M, NP       fluticasone (FLONASE) 50 MCG/ACT nasal  spray 2 spray  2 spray Each Nare Daily PRN Durwin Nora, Rashaun M, NP       gabapentin (NEURONTIN) capsule 300 mg  300 mg Oral TID Lerry Liner M, NP   300 mg at 09/25/23 1205   insulin aspart (novoLOG) injection 0-15 Units  0-15 Units Subcutaneous TID WC Dixon, Rashaun M, NP   3 Units at 09/25/23 1207   insulin aspart (novoLOG) injection 0-5 Units  0-5 Units Subcutaneous QHS Dixon, Rashaun M, NP       insulin aspart (novoLOG) injection 5 Units  5 Units Subcutaneous TID WC Dixon, Rashaun M, NP   5 Units at 09/25/23 1207   insulin glargine-yfgn (SEMGLEE) injection 16 Units  16 Units Subcutaneous QHS Lerry Liner M, NP   16 Units at 09/24/23 2152   lidocaine (LIDODERM) 5 % 1 patch  1 patch Transdermal Q24H Jearld Lesch, NP   1 patch at 09/24/23 2154   loratadine (CLARITIN) tablet 10 mg  10 mg Oral Daily Lerry Liner M, NP   10 mg at 09/25/23 0814   LORazepam (ATIVAN) injection 1 mg  1 mg Intravenous Q1H PRN Jearld Lesch, NP       magnesium hydroxide (  MILK OF MAGNESIA) suspension 30 mL  30 mL Oral Daily PRN Jearld Lesch, NP   30 mL at 09/24/23 1336   nicotine (NICODERM CQ - dosed in mg/24 hours) patch 14 mg  14 mg Transdermal Daily Durwin Nora, Rashaun M, NP   14 mg at 09/25/23 0813   ondansetron (ZOFRAN) tablet 4 mg  4 mg Oral Q6H PRN Jearld Lesch, NP       Or   ondansetron (ZOFRAN) injection 4 mg  4 mg Intravenous Q6H PRN Dixon, Rashaun M, NP       polyethylene glycol (MIRALAX / GLYCOLAX) packet 17 g  17 g Oral Daily PRN Jearld Lesch, NP       traZODone (DESYREL) tablet 25 mg  25 mg Oral QHS PRN Jearld Lesch, NP       PTA Medications: Medications Prior to Admission  Medication Sig Dispense Refill Last Dose/Taking   ACCU-CHEK GUIDE test strip Use to check blood sugar up to 2 times daily 200 each 3    Accu-Chek Softclix Lancets lancets Check sugar up to 2 times daily 200 each 3    Alcohol Swabs PADS 1 Dose by Does not apply route 3 (three) times daily before meals. 200 each 0     ALPRAZolam (XANAX) 0.25 MG tablet TAKE 1 TABLET(0.25 MG) BY MOUTH TWICE DAILY 60 tablet 2    Blood Glucose Calibration (ACCU-CHEK GUIDE CONTROL) LIQD Use for glucometer 1 each 0    Blood Glucose Monitoring Suppl (ACCU-CHEK GUIDE) w/Device KIT Use to check blood sugar up to 2 x daily 1 kit 0    buPROPion (WELLBUTRIN XL) 150 MG 24 hr tablet Take 1 tablet (150 mg total) by mouth daily. 90 tablet 0    gabapentin (NEURONTIN) 300 MG capsule TAKE 1 CAPSULE(300 MG) BY MOUTH THREE TIMES DAILY 90 capsule 2    insulin aspart (FIASP) 100 UNIT/ML FlexTouch Pen 6 units prior to meals (okay to switch to any short acting insulin that is covered) (Patient not taking: Reported on 09/21/2023) 15 mL 0    insulin detemir (LEVEMIR) 100 UNIT/ML FlexPen Inject 8 Units into the skin 2 (two) times daily. 20 units subcutaneous injection (okay to switch to any long acting insulin that is covered) 15 mL 0    Insulin Pen Needle 33G X 5 MM MISC 1 Dose by Does not apply route 3 (three) times daily before meals. 200 each 0    lidocaine (LIDODERM) 5 % Place 1 patch onto the skin daily. Remove & Discard patch within 12 hours or as directed by MD 30 patch 0    metFORMIN (GLUCOPHAGE) 500 MG tablet Take 2 tablets (1,000 mg total) by mouth 2 (two) times daily with a meal. (Patient not taking: Reported on 09/21/2023) 360 tablet 1    nicotine (NICODERM CQ - DOSED IN MG/24 HOURS) 14 mg/24hr patch One patch chest wall daily (substitute generic) 28 patch 0    polyethylene glycol (MIRALAX / GLYCOLAX) 17 g packet Take 17 g by mouth daily as needed for moderate constipation. (Patient not taking: Reported on 09/21/2023) 14 each 0    traZODone (DESYREL) 50 MG tablet Take 6 tablets (300 mg total) by mouth at bedtime. (Patient not taking: Reported on 09/21/2023) 90 tablet 1    TRULICITY 0.75 MG/0.5ML SOPN Inject 0.75 mg into the skin once a week. (Patient not taking: Reported on 09/21/2023) 2 mL 2     Patient Stressors: Financial difficulties    Marital or family conflict  Substance abuse    Patient Strengths: Average or above average intelligence  Communication skills  Motivation for treatment/growth  Supportive family/friends   Treatment Modalities: Medication Management, Group therapy, Case management,  1 to 1 session with clinician, Psychoeducation, Recreational therapy.   Physician Treatment Plan for Primary Diagnosis: MDD (major depressive disorder), recurrent severe, without psychosis (HCC) Long Term Goal(s):     Short Term Goals:    Medication Management: Evaluate patient's response, side effects, and tolerance of medication regimen.  Therapeutic Interventions: 1 to 1 sessions, Unit Group sessions and Medication administration.  Evaluation of Outcomes: Not Progressing  Physician Treatment Plan for Secondary Diagnosis: Principal Problem:   MDD (major depressive disorder), recurrent severe, without psychosis (HCC) Active Problems:   Tobacco use disorder   GAD (generalized anxiety disorder)  Long Term Goal(s):     Short Term Goals:       Medication Management: Evaluate patient's response, side effects, and tolerance of medication regimen.  Therapeutic Interventions: 1 to 1 sessions, Unit Group sessions and Medication administration.  Evaluation of Outcomes: Not Progressing   RN Treatment Plan for Primary Diagnosis: MDD (major depressive disorder), recurrent severe, without psychosis (HCC) Long Term Goal(s): Knowledge of disease and therapeutic regimen to maintain health will improve  Short Term Goals: Ability to remain free from injury will improve  Medication Management: RN will administer medications as ordered by provider, will assess and evaluate patient's response and provide education to patient for prescribed medication. RN will report any adverse and/or side effects to prescribing provider.  Therapeutic Interventions: 1 on 1 counseling sessions, Psychoeducation, Medication administration,  Evaluate responses to treatment, Monitor vital signs and CBGs as ordered, Perform/monitor CIWA, COWS, AIMS and Fall Risk screenings as ordered, Perform wound care treatments as ordered.  Evaluation of Outcomes: Not Progressing   LCSW Treatment Plan for Primary Diagnosis: MDD (major depressive disorder), recurrent severe, without psychosis (HCC) Long Term Goal(s): Safe transition to appropriate next level of care at discharge, Engage patient in therapeutic group addressing interpersonal concerns.  Short Term Goals: Engage patient in aftercare planning with referrals and resources  Therapeutic Interventions: Assess for all discharge needs, 1 to 1 time with Social worker, Explore available resources and support systems, Assess for adequacy in community support network, Educate family and significant other(s) on suicide prevention, Complete Psychosocial Assessment, Interpersonal group therapy.  Evaluation of Outcomes: Not Progressing   Progress in Treatment: Attending groups: Yes. Participating in groups: Yes. Taking medication as prescribed: Yes. Toleration medication: Yes. Family/Significant other contact made: Yes, individual(s) contacted:  wife Ernest Haber [phone] Patient understands diagnosis: Yes. Discussing patient identified problems/goals with staff: Yes. Medical problems stabilized or resolved: Yes. Denies suicidal/homicidal ideation: Yes. Issues/concerns per patient self-inventory: No. Other: none reported  New problem(s) identified: No, Describe:  none reported  New Short Term/Long Term Goal(s): detox, medication management for mood stabilization; elimination of SI thoughts; development of comprehensive mental wellness/sobriety plan    Patient Goals:  " going home and get medications"  Discharge Plan or Barriers: Patient recently admitted. CSW will continue to follow and assess for appropriate referrals and possible discharge planning.    Reason for Continuation  of Hospitalization: Aggression Anxiety Depression Suicidal ideation  Estimated Length of Stay: 5-7 days  Last 3 [place] Suicide Severity Risk Score: Flowsheet Row Ad[facility] (Current[facility]osp-Admission (Discharged) from 09/21/2023 in [place] ED to Hosp-Admission (Discharged) from 01/26/2022 in [facility]  GENERAL SURGERY  C-SSRS RISK CATEGORY High Risk High Risk No Risk       Last PHQ 2/9 Scores:    02/15/2022    1:21 PM 04/26/2019    9:56 AM 03/07/2019    9:45 AM  Depression screen PHQ 2/9  Decreased Interest 0 1 0  Down, Depressed, Hopeless 1 0 0  PHQ - 2 Score 1 1 0  Altered sleeping 1 0   Tired, decreased energy 3 1   Change in appetite 0 0   Feeling bad or failure about yourself  0 0   Trouble concentrating 0 0   Moving slowly or fidgety/restless 0 0   Suicidal thoughts 0 0   PHQ-9 Score 5 2   Difficult doing work/chores Not difficult at all Somewhat difficult     Scribe for Treatment Team: Kathrynn Humble 09/25/2023 3:32 PM

## 2023-09-25 NOTE — Progress Notes (Signed)
   09/25/2023  Patient ID: Christy Mayer, female   DOB: October 05, 1966, 57 y.o.   MRN: [id number]  Scheduled to outreach to patient today as referred by PCP for assistance with medication access, medication adherence and diabetes medication management.  From review of chart, note patient currently admitted to [facility] since 09/24/2023.  Follow Up Plan: Will attempt to reach patient by telephone within the next 30 days.  Estelle Grumbles, PharmD, Patsy [place], CPP Clinical Pharmacist [facility] [phone]

## 2023-09-25 NOTE — Plan of Care (Signed)
  Problem: Education: Goal: Mental status will improve Outcome: Progressing   Problem: Activity: Goal: Interest or engagement in activities will improve Outcome: Progressing   

## 2023-09-25 NOTE — Progress Notes (Signed)
Patient care was assumed at 1930. Patient seen in room sitting on side of bed. Alert and orient, denies SI/HI/AVH and rates back pain 9/10. States today "was good, I'm ready to home." Rates anxiety 5/10 and depression 0/10. PRN Oxycodone given for pain, was effective, see MAR. Patient has slept through the night, respirations regular, even, and unlabored. No apparent signs of distress note. Will continue plan of care.

## 2023-09-25 NOTE — Progress Notes (Signed)
   09/24/23 2200  Psych Admission Type (Psych Patients Only)  Admission Status Involuntary  Psychosocial Assessment  Patient Complaints Anxiety;Depression  Eye Contact Fair  Facial Expression Flat  Affect Depressed  Speech Logical/coherent  Interaction Assertive  Motor Activity Slow  Appearance/Hygiene Unremarkable  Behavior Characteristics Cooperative;Appropriate to situation  Mood Depressed  Thought Process  Coherency WDL  Content WDL  Delusions None reported or observed  Perception WDL  Hallucination None reported or observed  Judgment Impaired  Confusion WDL  Danger to Self  Current suicidal ideation? Denies (Denies)  Description of Suicide Plan Denies  Self-Injurious Behavior No self-injurious ideation or behavior indicators observed or expressed  (None)  Agreement Not to Harm Self Yes  Description of Agreement Verbal

## 2023-09-25 NOTE — Group Note (Signed)
Occupational Therapy Group Note  Group Topic: Sleep Hygiene  Group Date: 09/25/2023 Start Time: 1430 End Time: 1500 Facilitators: Ted Mcalpine, OT   Group Description: Group encouraged increased participation and engagement through topic focused on sleep hygiene. Patients reflected on the quality of sleep they typically receive and identified areas that need improvement. Group was given background information on sleep and sleep hygiene, including common sleep disorders. Group members also received information on how to improve one's sleep and introduced a sleep diary as a tool that can be utilized to track sleep quality over a length of time. Group session ended with patients identifying one or more strategies they could utilize or implement into their sleep routine in order to improve overall sleep quality.        Therapeutic Goal(s):  Identify one or more strategies to improve overall sleep hygiene  Identify one or more areas of sleep that are negatively impacted (sleep too much, too little, etc)     Participation Level: Engaged   Participation Quality: Independent   Behavior: Appropriate   Speech/Thought Process: Relevant   Affect/Mood: Flat   Insight: Fair   Judgement: Fair      Modes of Intervention: Education  Patient Response to Interventions:  Attentive   Plan: Continue to engage patient in OT groups 2 - 3x/week.  09/25/2023  Ted Mcalpine, OT   Kerrin Champagne, OT

## 2023-09-26 ENCOUNTER — Telehealth (HOSPITAL_COMMUNITY): Payer: Self-pay | Admitting: Pharmacy Technician

## 2023-09-26 ENCOUNTER — Telehealth: Payer: Self-pay | Admitting: Family Medicine

## 2023-09-26 LAB — GLUCOSE, CAPILLARY
Glucose-Capillary: 167 mg/dL — ABNORMAL HIGH (ref 70–99)
Glucose-Capillary: 136 mg/dL — ABNORMAL HIGH (ref 70–99)
Glucose-Capillary: 211 mg/dL — ABNORMAL HIGH (ref 70–99)
Glucose-Capillary: 196 mg/dL — ABNORMAL HIGH (ref 70–99)

## 2023-09-26 NOTE — Group Note (Signed)
Date:  09/26/2023 Time:  11:24 AM  Group Topic/Focus:  Orientation:   The focus of this group is to educate the patient on the purpose and policies of crisis stabilization and provide a format to answer questions about their admission.  The group details unit policies and expectations of patients while admitted.    Participation Level:  Active  Participation Quality:  Appropriate  Affect:  Appropriate  Cognitive:  Appropriate  Insight: Appropriate  Engagement in Group:  Engaged  Modes of Intervention:  Discussion  Additional Comments:     Reymundo Poll 09/26/2023, 11:24 AM
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
## 2023-09-26 NOTE — Progress Notes (Signed)
   09/25/23 2000  Psych Admission Type (Psych Patients Only)  Admission Status Involuntary  Psychosocial Assessment  Patient Complaints Anxiety  Eye Contact Fair  Facial Expression Flat  Affect Depressed  Speech Logical/coherent  Interaction Assertive  Motor Activity Slow  Appearance/Hygiene Unremarkable  Behavior Characteristics Cooperative;Appropriate to situation  Mood Pleasant  Thought Process  Coherency WDL  Content WDL  Delusions None reported or observed  Perception WDL  Hallucination None reported or observed  Judgment Impaired  Confusion WDL  Danger to Self  Current suicidal ideation? Denies (Denies)  Self-Injurious Behavior No self-injurious ideation or behavior indicators observed or expressed  (None)  Agreement Not to Harm Self Yes  Description of Agreement Verbal  Danger to Others  Danger to Others None reported or observed
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
## 2023-09-26 NOTE — Group Note (Unsigned)
Date:  09/27/2023 Time:  5:07 AM  Group Topic/Focus:  Wrap-Up Group:   The focus of this group is to help patients review their daily goal of treatment and discuss progress on daily workbooks.    Participation Level:  Active  Participation Quality:  Appropriate and Sharing  Affect:  Appropriate  Cognitive:  Appropriate  Insight: Appropriate  Engagement in Group:  Engaged  Modes of Intervention:  Activity and Socialization  Additional Comments:  Patient stated that she is doing "good" and she had a "good day". Patient shared that she attended pet therapy along with other groups. Patient shared that she is being discharged on tomorrow. Patient rated her day a 8/10.   Kennieth Francois 09/27/2023, 5:07 AM

## 2023-09-26 NOTE — Progress Notes (Signed)
   09/26/23 1400  Psych Admission Type (Psych Patients Only)  Admission Status Involuntary  Psychosocial Assessment  Patient Complaints None  Eye Contact Fair  Facial Expression Animated  Affect Depressed  Speech Logical/coherent  Interaction Assertive  Motor Activity Slow  Appearance/Hygiene Unremarkable  Behavior Characteristics Cooperative  Mood Pleasant  Thought Process  Coherency WDL  Content WDL  Delusions None reported or observed  Perception WDL  Hallucination None reported or observed  Judgment Impaired  Confusion None  Danger to Self  Current suicidal ideation? Denies  Danger to Others  Danger to Others None reported or observed   Dar Note: Patient presents with a calm affect and pleasant mood.  Denies suicidal thoughts, auditory and visual hallucinations.  Attended group and participated.  Medications given as prescribed.  Patient is safe on and off the unit.  Offered no complaints.

## 2023-09-26 NOTE — Discharge Instructions (Signed)

## 2023-09-26 NOTE — Group Note (Signed)
LCSW Group Therapy Note  Group Date: 09/26/2023 Start Time: 1100 End Time: 1200   Type of Therapy and Topic:  Group Therapy - Healthy vs Unhealthy Coping Skills  Participation Level:  Active   Description of Group The focus of this group was to determine what unhealthy coping techniques typically are used by group members and what healthy coping techniques would be helpful in coping with various problems. Patients were guided in becoming aware of the differences between healthy and unhealthy coping techniques. Patients were asked to identify 2-3 healthy coping skills they would like to learn to use more effectively.  Therapeutic Goals Patients learned that coping is what human beings do all day long to deal with various situations in their lives Patients defined and discussed healthy vs unhealthy coping techniques Patients identified their preferred coping techniques and identified whether these were healthy or unhealthy Patients determined 2-3 healthy coping skills they would like to become more familiar with and use more often. Patients provided support and ideas to each other   Summary of Patient Progress:  During group, good expressed feedback. Patient proved open to input from peers and feedback from CSW. Patient demonstrated good insight into the subject matter, was respectful of peers, and participated throughout the entire session.   Therapeutic Modalities Cognitive Behavioral Therapy Motivational Interviewing  Marinda Elk, [place] 09/26/2023  1:41 PM

## 2023-09-26 NOTE — Telephone Encounter (Signed)
Can you follow up with [facility] to make sure they understand we cannot complete the disability paperwork?  We have received multiple copies of paperwork from [facility] Short Term Disability for this patient.  The paperwork was originally sent 09/12/23.  I cannot complete disability paperwork since she has not been seen here in 1.5 years, last visit was virtual video 03/2022.  Now at this time she has actually been hospitalized with behavioral health. She remains in the hospital at this time.  I attempted to call [employer] on 09/26/23 to notify them she has not been seen and I cannot do the paperwork.  I left a detailed voicemail for [place]. The Claims Examiner for this case.  Claim # [id number]  Her Phone: [phone], Ext [phone]  Saralyn Pilar, DO [facility] 09/26/2023, 6:24 PM

## 2023-09-26 NOTE — Plan of Care (Signed)
°  Problem: Education: °Goal: Emotional status will improve °Outcome: Progressing °Goal: Mental status will improve °Outcome: Progressing °Goal: Verbalization of understanding the information provided will improve °Outcome: Progressing °  °

## 2023-09-26 NOTE — Telephone Encounter (Signed)
Pharmacy Patient Advocate Encounter   Received notification from Fax that prior authorization for Lidocaine 5% patches is required/requested.   Insurance verification completed.   The patient is insured through [facility] MEDICAID .   Per test claim: PA required; PA submitted to above mentioned insurance via CoverMyMeds Key/confirmation #/EOC [id number] Status is pending

## 2023-09-26 NOTE — Group Note (Signed)
Recreation Therapy Group Note   Group Topic:Animal Assisted Therapy   Group Date: 09/26/2023 Start Time: 0950 End Time: 1030 Facilitators: Chaysen Tillman-McCall, LRT,CTRS Location: [address] Dayroom   Animal-Assisted Activity (AAA) Program Checklist/Progress Notes Patient Eligibility Criteria Checklist & Daily Group note for Rec Tx Intervention  AAA/T Program Assumption of Risk Form signed by Patient/ or Parent Legal Guardian Yes  Patient is free of allergies or severe asthma Yes  Patient reports no fear of animals Yes  Patient reports no history of cruelty to animals Yes  Patient understands his/her participation is voluntary Yes  Patient washes hands before animal contact Yes  Patient washes hands after animal contact Yes  Education: Hand Washing, Appropriate Animal Interaction   Education Outcome: Acknowledges education.    Affect/Mood: Appropriate   Participation Level: Engaged   Participation Quality: Independent   Behavior: Appropriate   Speech/Thought Process: Focused   Insight: Good   Judgement: Good   Modes of Intervention: Teaching laboratory technician   Patient Response to Interventions:  Engaged   Education Outcome:  In group clarification offered    Clinical Observations/Individualized Feedback: Patient attended session and interacted appropriately with therapy dog and peers. Patient asked appropriate questions about therapy dog and his training. Patient shared stories about their pets at home with group.      Plan: Continue to engage patient in RT group sessions 2-3x/week.   Cheresa Siers-McCall, LRT,CTRS 09/26/2023 2:06 PM

## 2023-09-26 NOTE — [facility] Suicide Risk Assessment (Signed)
[facility] INPATIENT:  Family/Significant Other Suicide Prevention Education  Suicide Prevention Education:  Contact Attempts: Rondell Reams (husband) [phone], (name of family member/significant other) has been identified by the patient as the family member/significant other with whom the patient will be residing, and identified as the person(s) who will aid the patient in the event of a mental health crisis.  With written consent from the patient, two attempts were made to provide suicide prevention education, prior to and/or following the patient's discharge.  We were unsuccessful in providing suicide prevention education.  A suicide education pamphlet was given to the patient to share with family/significant other.  Date and time of first attempt:09/26/23 @ 12:30PM  Unable to leave message, will attempt again prior to discharge   Kathi Der 09/26/2023, 12:33 PM

## 2023-09-27 LAB — GLUCOSE, CAPILLARY: Glucose-Capillary: 154 mg/dL — ABNORMAL HIGH (ref 70–99)

## 2023-09-27 MED ORDER — TRULICITY 0.75 MG/0.5ML ~~LOC~~ SOAJ: 0.7500 mg | SUBCUTANEOUS | Status: DC

## 2023-09-27 MED ORDER — FLUTICASONE PROPIONATE 50 MCG/ACT NA SUSP: 2.0000 | Freq: Every day | NASAL | 0 refills | Status: DC | PRN

## 2023-09-27 MED ORDER — HYDROXYZINE HCL 25 MG PO TABS: 25.0000 mg | ORAL_TABLET | Freq: Three times a day (TID) | ORAL | 0 refills | Status: DC | PRN

## 2023-09-27 MED ORDER — ALBUTEROL SULFATE (2.5 MG/3ML) 0.083% IN NEBU: 2.5000 mg | INHALATION_SOLUTION | Freq: Four times a day (QID) | RESPIRATORY_TRACT | 0 refills | Status: DC | PRN

## 2023-09-27 MED ORDER — NICOTINE 14 MG/24HR TD PT24: MEDICATED_PATCH | TRANSDERMAL | 0 refills | Status: DC

## 2023-09-27 MED ORDER — BUPROPION HCL ER (XL) 150 MG PO TB24: 150.0000 mg | ORAL_TABLET | Freq: Every day | ORAL | 0 refills | Status: DC

## 2023-09-27 MED ORDER — INSULIN ASPART 100 UNIT/ML IJ SOLN: 5.0000 [IU] | Freq: Three times a day (TID) | INTRAMUSCULAR | 0 refills | Status: DC

## 2023-09-27 MED ORDER — HYDROXYZINE HCL 25 MG PO TABS
25.0000 mg | ORAL_TABLET | Freq: Three times a day (TID) | ORAL | Status: DC | PRN
  Administered 2023-09-27: 25 mg via ORAL
  Filled 2023-09-27: qty 1

## 2023-09-27 NOTE — Telephone Encounter (Signed)
Left message advising patient needs an appointment to discuss this paperwork.

## 2023-09-27 NOTE — Progress Notes (Signed)
  [facility] Adult Case Management Discharge Plan :  Will you be returning to the same living situation after discharge:  Yes,  pt will going home with husband,  Audelia Hives [phone] At discharge, do you have transportation home?: Yes,  pt will be transported by family friend Do you have the ability to pay for your medications: Yes,  pt has active medical coverage.  Release of information consent forms completed and in the chart;  Patient's signature needed at discharge.  Patient to Follow up at:  Follow-up Information     [facility], [facility]. Go on 10/02/2023.   Why: You have a hospital follow up appointment on 10/02/23 at 9:00 am.  The appointment will be held in person.  Following this appointment, you will be scheduled for a clinical assessment, to obtain necessary therapy and medication management services. Contact information: [address] [place] [address] [phone]                 Next level of care provider has access to [facility] Link:no  Safety Planning and Suicide Prevention discussed: Yes,  Safety planning completed with husband.      Has patient been referred to the Quitline?: Patient refused referral for treatment  Patient has been referred for addiction treatment: Patient refused referral for treatment.  Morris Longenecker, Candace Cruise, LCSWA 09/27/2023, 9:37 AM

## 2023-09-27 NOTE — Group Note (Signed)
Date:  09/27/2023 Time:  11:07 AM  Group Topic/Focus:  Developing a Wellness Toolbox:   The focus of this group is to help patients develop a "wellness toolbox" with skills and strategies to promote recovery upon discharge. Goals Group:   The focus of this group is to help patients establish daily goals to achieve during treatment and discuss how the patient can incorporate goal setting into their daily lives to aide in recovery.    Participation Level:  Active  Participation Quality:  Appropriate  Affect:  Appropriate  Cognitive:  Appropriate  Insight: Appropriate  Engagement in Group:  Improving  Modes of Intervention:  Education  Additional Comments:  Pt attended Group  Rakeb Kibble E Ausar Georgiou 09/27/2023, 11:07 AM

## 2023-09-27 NOTE — Discharge Summary (Addendum)
Physician Discharge Summary Note  Patient:  Christy Mayer is an 57 y.o., female MRN:  [id number] DOB:  August 12, 1966 Patient phone:  [phone] (home)  Patient address:   [address] [place] [address],  Total Time spent with patient: 30 minutes  Date of Admission:  09/24/2023 Date of Discharge: 09/27/2023  Reason for Admission:    Christy Mayer is a 57 y.o., female with past psychiatric history of GAD, GAD, Tobacco Use Disorder who presents to the [facility] from [facility] for evaluation and management of worsening depression, anxiety and an overdose attempt after taking 20 pills of trazodone and drinking EtOH.   Principal Problem: MDD (major depressive disorder), recurrent severe, without psychosis (HCC) Discharge Diagnoses: Principal Problem:   MDD (major depressive disorder), recurrent severe, without psychosis (HCC) Active Problems:   Tobacco use disorder   GAD (generalized anxiety disorder)   Past Psychiatric History:  Previous psych diagnoses:  MDD, GAD  Prior inpatient psychiatric treatment: Denies Prior outpatient psychiatric treatment: Prozac 20 mg daily, Cymbalta 60 mg daily, Lexapro 20 mg daily - patient reports poor response Current psychiatric provider:  Denies,  Prior psychiatric provider: Berton Bon, PA    Neuromodulation history: denies   Current therapist:  Denies Psychotherapy hx: Berton Bon, PA    History of suicide attempts: Denies History of homicide: Denies  Past Medical History:  Past Medical History:  Diagnosis Date   Arthritis    Asthma    Back pain    COPD (chronic obstructive pulmonary disease) (HCC)    Emphysema lung (HCC)    Heart palpitations    Miscarriage    Mitral valve prolapse     Past Surgical History:  Procedure Laterality Date   CESAREAN SECTION  11/2007   Family History:  Family History  Adopted: Yes   Family Psychiatric  History:  Psych: Unaware, patient adopted, adopted mother  (paranoid schizophrenia)  Psych Rx: Unaware  Suicide: Unaware  Homicide: Unaware  Substance use family hx: Adopted Mom, EtOH  Social History:  Social History   Substance and Sexual Activity  Alcohol Use Yes   Alcohol/week: 0.0 standard drinks of alcohol   Comment: occasionally      Social History   Substance and Sexual Activity  Drug Use No    Social History   Socioeconomic History   Marital status: Married    Spouse name: Not on file   Number of children: Not on file   Years of education: Not on file   Highest education level: Not on file  Occupational History   Occupation: work from home  Tobacco Use   Smoking status: Every Day    Current packs/day: 0.50    Average packs/day: 0.5 packs/day for 30.0 years (15.0 ttl pk-yrs)    Types: Cigarettes   Smokeless tobacco: Never  Vaping Use   Vaping status: Never Used  Substance and Sexual Activity   Alcohol use: Yes    Alcohol/week: 0.0 standard drinks of alcohol    Comment: occasionally    Drug use: No   Sexual activity: Yes    Birth control/protection: None  Other Topics Concern   Not on file  Social History Narrative   Not on file   Social Drivers of Health   Financial Resource Strain: Not on file  Food Insecurity: Patient Declined (09/24/2023)   Hunger Vital Sign    Worried About Running Out of Food in the Last Year: Patient declined    Ran Out of Food  in the Last Year: Patient declined  Transportation Needs: No Transportation Needs (09/25/2023)   PRAPARE - Administrator, Civil Service (Medical): No    Lack of Transportation (Non-Medical): No  Physical Activity: Not on file  Stress: Not on file  Social Connections: Not on file    Hospital Course:    During the patient's hospitalization, patient had extensive initial psychiatric evaluation, and follow-up psychiatric evaluations every day.  Psychiatric diagnoses provided upon initial assessment:  Major Depressive Disorder  Generalized  Anxiety Disorder   Patient's psychiatric medications were adjusted on admission:  - Restarted Home Wellbutrin 150 mg daily, patient hadn't been on medication for 2 months due to losing medicaid  - Restarted Home Xanax 0.25 mg twice daily, patient hadn't been on medication for 2 months after losing medicaid   - Restarted Gabapentin 300 mg three times daily - Started on Trazodone 300 mg at bedtime  During the hospitalization, other adjustments were made to the patient's psychiatric medication regimen:  - Changed Xanax 0.25 mg twice daily to as needed medication, patient did not request medication, and was later discontinued, no withdrawal symptoms noted throughout admission  - Started Hydroxyzine 25 mg three times daily as needed for anxiety  - Discontinued Trazodone 300 mg at bedtime - Discontinued Trazodone 25 mg at bedtime as needed   Patient's care was discussed during the interdisciplinary team meeting every day during the hospitalization.  The patient denied having side effects to prescribed psychiatric medication.  Gradually, patient started adjusting to milieu. The patient was evaluated each day by a clinical provider to ascertain response to treatment. Improvement was noted by the patient's report of decreasing symptoms, improved sleep and appetite, affect, medication tolerance, behavior, and participation in unit programming.  Patient was asked each day to complete a self inventory noting mood, mental status, pain, new symptoms, anxiety and concerns.    Symptoms were reported as significantly decreased or resolved completely by discharge.   On day of discharge, the patient reports that their mood is stable. The patient denied having suicidal thoughts for more than 48 hours prior to discharge.  Patient denies having homicidal thoughts.  Patient denies having auditory hallucinations.  Patient denies any visual hallucinations or other symptoms of psychosis. The patient was motivated to  continue taking medication with a goal of continued improvement in mental health.   The patient reports their target psychiatric symptoms of depression and anxiety responded well to the psychiatric medications, and the patient reports overall benefit other psychiatric hospitalization. Supportive psychotherapy was provided to the patient. The patient also participated in regular group therapy while hospitalized. Coping skills, problem solving as well as relaxation therapies were also part of the unit programming.  Labs were reviewed with the patient, and abnormal results were discussed with the patient.  The patient is able to verbalize their individual safety plan to this provider.  # It is recommended to the patient to continue psychiatric medications as prescribed, after discharge from the hospital.    # It is recommended to the patient to follow up with your outpatient psychiatric provider and PCP.  # It was discussed with the patient, the impact of alcohol, drugs, tobacco have been there overall psychiatric and medical wellbeing, and total abstinence from substance use was recommended the patient.ed.  # Prescriptions provided or sent directly to preferred pharmacy at discharge. Patient agreeable to plan. Given opportunity to ask questions. Appears to feel comfortable with discharge.    # In the event  of worsening symptoms, the patient is instructed to call the crisis hotline, 911 and or go to the nearest ED for appropriate evaluation and treatment of symptoms. To follow-up with primary care provider for other medical issues, concerns and or health care needs  # Patient was discharged Home with a plan to follow up as noted below.   Physical Findings: AIMS:  , ,  ,  ,   Patient not on anti-psychotic medication  CIWA:  CIWA-Ar Total: 1 COWS:   0  Musculoskeletal: Strength & Muscle Tone: within normal limits Gait & Station: normal Patient leans: N/A   Psychiatric Specialty  Exam:  Presentation  General Appearance:  Appropriate for Environment; Casual  Eye Contact: Good  Speech: Clear and Coherent  Speech Volume: Normal  Handedness:No data recorded  Mood and Affect  Mood: Euthymic; Anxious  Affect: Congruent   Thought Process  Thought Processes: Coherent; Linear  Descriptions of Associations:Intact  Orientation:Full (Time, Place and Person)  Thought Content:Logical  History of Schizophrenia/Schizoaffective disorder:None  Duration of Psychotic Symptoms: None Hallucinations:Hallucinations: None  Ideas of Reference:None  Suicidal Thoughts:Suicidal Thoughts: No  Homicidal Thoughts:Homicidal Thoughts: No   Sensorium  Memory: Immediate Good; Recent Good  Judgment: Intact  Insight: Present   Executive Functions  Concentration: Good  Attention Span: Good  Recall: Good  Fund of Knowledge: Good  Language: Good   Psychomotor Activity  Psychomotor Activity: Psychomotor Activity: Normal   Assets  Assets: Communication Skills; Desire for Improvement; Housing; Intimacy; Financial Resources/Insurance; Vocational/Educational; Social Support   Sleep  Sleep: Sleep: Good  Physical Exam:  Physical Exam Constitutional:      Appearance: Normal appearance.  Musculoskeletal:        General: Normal range of motion.  Neurological:     Mental Status: She is alert and oriented to person, place, and time.  Psychiatric:        Attention and Perception: She does not perceive auditory or visual hallucinations.        Mood and Affect: Mood normal. Mood is not depressed. Mood is anxious       Speech: Speech normal.        Behavior: Behavior normal. Behavior is cooperative.        Thought Content: Thought content is not paranoid or delusional. Thought content does not include homicidal or suicidal ideation.     Review of Systems  Constitutional:  Negative for chills and fever.  Respiratory:  Negative for cough.    Cardiovascular:  Negative for chest pain.  Gastrointestinal:  Negative for constipation, diarrhea, nausea and vomiting.  Neurological:  Negative for weakness and headaches.  Psychiatric/Behavioral:  Negative for depression, hallucinations and suicidal ideas. The patient is nervous/anxious.    Blood pressure (!) 157/90, pulse 79, temperature 99 F (37.2 C), temperature source Oral, resp. rate 20, height 5\' 7"  (1.702 m), weight 110.2 kg, last menstrual period 08/11/2014, SpO2 96%. Body mass index is 38.06 kg/m.   Social History   Tobacco Use  Smoking Status Every Day   Current packs/day: 0.50   Average packs/day: 0.5 packs/day for 30.0 years (15.0 ttl pk-yrs)   Types: Cigarettes  Smokeless Tobacco Never   Tobacco Cessation:  A prescription for an FDA-approved tobacco cessation medication provided at discharge   Blood Alcohol level:  Lab Results  Component Value Date   ETH 152 (H) 09/21/2023    Metabolic Disorder Labs:  Lab Results  Component Value Date   HGBA1C 9.0 (H) 09/21/2023   MPG 211.6 09/21/2023  MPG 191.51 02/01/2022   No results found for: "PROLACTIN" Lab Results  Component Value Date   CHOL 258 (H) 07/30/2018   TRIG 254 (H) 07/30/2018   HDL 44 (L) 07/30/2018   CHOLHDL 5.9 (H) 07/30/2018   VLDL 44 (H) 04/17/2011   LDLCALC 172 (H) 07/30/2018   LDLCALC 93 04/17/2011    See Psychiatric Specialty Exam and Suicide Risk Assessment completed by Attending Physician prior to discharge.  Discharge destination:  Home  Is patient on multiple antipsychotic therapies at discharge:  No   Has Patient had three or more failed trials of antipsychotic monotherapy by history:  No  Recommended Plan for Multiple Antipsychotic Therapies: NA  Discharge Instructions     Diet - low sodium heart healthy   Complete by: As directed    Increase activity slowly   Complete by: As directed       Allergies as of 09/27/2023       Reactions   Aspirin Nausea Only         Medication List     STOP taking these medications    ALPRAZolam 0.25 MG tablet Commonly known as: XANAX   insulin aspart 100 UNIT/ML FlexTouch Pen Commonly known as: FIASP   metFORMIN 500 MG tablet Commonly known as: GLUCOPHAGE   polyethylene glycol 17 g packet Commonly known as: MIRALAX / GLYCOLAX   traZODone 50 MG tablet Commonly known as: DESYREL       TAKE these medications      Indication  Accu-Chek Guide Control Liqd Use for glucometer  Indication: Diabetes   Accu-Chek Guide test strip Generic drug: glucose blood Use to check blood sugar up to 2 times daily  Indication: Diabetes   Accu-Chek Guide w/Device Kit Use to check blood sugar up to 2 x daily  Indication: Diabetes   Accu-Chek Softclix Lancets lancets Check sugar up to 2 times daily  Indication: Diabetes   albuterol (2.5 MG/3ML) 0.083% nebulizer solution Commonly known as: PROVENTIL Take 3 mLs (2.5 mg total) by nebulization every 6 (six) hours as needed for shortness of breath or wheezing.  Indication: Worsening of Chronic Obstructive Lung Disease   Alcohol Swabs Pads 1 Dose by Does not apply route 3 (three) times daily before meals.  Indication: Diabetes   buPROPion 150 MG 24 hr tablet Commonly known as: WELLBUTRIN XL Take 1 tablet (150 mg total) by mouth daily.  Indication: Major Depressive Disorder   fluticasone 50 MCG/ACT nasal spray Commonly known as: FLONASE Place 2 sprays into both nostrils daily as needed for allergies.  Indication: Stuffy Nose   gabapentin 300 MG capsule Commonly known as: NEURONTIN TAKE 1 CAPSULE(300 MG) BY MOUTH THREE TIMES DAILY  Indication: Diabetes with Nerve Disease   hydrOXYzine 25 MG tablet Commonly known as: ATARAX Take 1 tablet (25 mg total) by mouth 3 (three) times daily as needed for anxiety.  Indication: Feeling Anxious   insulin aspart 100 UNIT/ML injection Commonly known as: novoLOG Inject 5 Units into the skin 3 (three) times daily with  meals.  Indication: Type 2 Diabetes   insulin detemir 100 UNIT/ML FlexPen Commonly known as: LEVEMIR Inject 8 Units into the skin 2 (two) times daily. 20 units subcutaneous injection (okay to switch to any long acting insulin that is covered)  Indication: Type 2 Diabetes   Insulin Pen Needle 33G X 5 MM Misc 1 Dose by Does not apply route 3 (three) times daily before meals.  Indication: Diabetes   lidocaine 5 % Commonly known as:  LIDODERM Place 1 patch onto the skin daily. Remove & Discard patch within 12 hours or as directed by MD  Indication: Allodynia   nicotine 14 mg/24hr patch Commonly known as: NICODERM CQ - dosed in mg/24 hours One patch chest wall daily (substitute generic)  Indication: Nicotine Addiction   Trulicity 0.75 MG/0.5ML Soaj Generic drug: Dulaglutide Inject 0.75 mg into the skin once a week.  Indication: Type 2 Diabetes        Follow-up Information     [facility]. Go on 10/02/2023.   Why: You have a hospital follow up appointment on 10/02/23 at 9:00 am.  The appointment will be held in person.  Following this appointment, you will be scheduled for a clinical assessment, to obtain necessary therapy and medication management services. Contact information: [address] [place] [address] [phone]                 Follow-up recommendations:    Activity: as tolerated   Diet: heart healthy   Other: -Follow-up with your outpatient psychiatric provider -instructions on appointment date, time, and address (location) are provided to you in discharge paperwork.   -Take your psychiatric medications as prescribed at discharge - instructions are provided to you in the discharge paperwork   -Follow-up with outpatient primary care doctor and other specialists -for management of preventative medicine and chronic medical disease -Patient hypertensive throughout admission, needs medication to optimize blood pressure control    -Testing: Follow-up with outpatient provider for abnormal lab results:  A1c 9.0 continue to follow with your primary provider     -Recommend total abstinence from alcohol, tobacco, and other illicit drug use at discharge.    -If your psychiatric symptoms recur, worsen, or if you have side effects to your psychiatric medications, call your outpatient psychiatric provider, 911, [phone] or go to the nearest emergency department.   -If suicidal thoughts occur, immediately call your outpatient psychiatric provider, 911, [phone] or go to the nearest emergency department.    Signed: Peterson Ao, MD 09/27/2023, 9:54 AM

## 2023-09-27 NOTE — Telephone Encounter (Signed)
Pharmacy Patient Advocate Encounter  Received notification from [facility] MEDICAID that Prior Authorization for Lidocaine 5% patches  has been DENIED.  Full denial letter will be uploaded to the media tab. See denial reason below. Per your health plan's criteria, this drug is covered if you meet the following: You have tried or cannot use one of the following drug classes:  (a) Tri-cyclic antidepressants: Amitriptyline.  (b) Selective serotonin reuptake inhibitors: Citalopram, escitalopram. fluoxetine.  (c) Serotonin and norepinephrine reuptake inhibitors: duloxetine capsule, venlafaxine.  (d) Non-steroidal anti-inflammatory drugs: Ibuprofen, meloxicam.  (e) Cytochrome c oxidase subunit II: celecoxib. The information provided does not show that you meet the criteria listed above  PA #/Case ID/Reference #: [id number]

## 2023-09-27 NOTE — [facility] Suicide Risk Assessment (Signed)
[facility] INPATIENT:  Family/Significant Other Suicide Prevention Education  Suicide Prevention Education:  Education Completed; Audelia Hives, [phone]  (name of family member/significant other) has been identified by the patient as the family member/significant other with whom the patient will be residing, and identified as the person(s) who will aid the patient in the event of a mental health crisis (suicidal ideations/suicide attempt).  With written consent from the patient, the family member/significant other has been provided the following suicide prevention education, prior to the and/or following the discharge of the patient.  CSW completed Safety Planning with husband who reported no safety concerns with pt returning home. Husband no firearms in the home and he has secured all medications and sharps.  The suicide prevention education provided includes the following: Suicide risk factors Suicide prevention and interventions National Suicide Hotline telephone number [address] [facility] assessment telephone number [facility] Emergency Assistance 911 [facility] and/or Residential Mobile Crisis Unit telephone number  Request made of family/significant other to: Remove weapons (e.g., guns, rifles, knives), all items previously/currently identified as safety concern.   Remove drugs/medications (over-the-counter, prescriptions, illicit drugs), all items previously/currently identified as a safety concern.  The family member/significant other verbalizes understanding of the suicide prevention education information provided.  The family member/significant other agrees to remove the items of safety concern listed above.  Rogene Houston 09/27/2023, 9:46 AM

## 2023-09-27 NOTE — Progress Notes (Signed)
Patient discharged to home accompanied by friend. Discharge instructions, all required discharge documents and information about follow-up appointment given to pt with verbalization of understanding. All personal belongings returned to pt at time of discharge. Plan of Care resolved. Pt escorted to lobby by RN at 1112.  09/27/23 0954  Psych Admission Type (Psych Patients Only)  Admission Status Involuntary  Psychosocial Assessment  Patient Complaints Anxiety  Eye Contact Fair  Facial Expression Animated  Affect Anxious  Speech Logical/coherent  Interaction Assertive  Motor Activity Slow  Appearance/Hygiene Unremarkable  Behavior Characteristics Cooperative  Mood Anxious;Pleasant  Thought Process  Coherency WDL  Content WDL  Delusions None reported or observed  Perception WDL  Hallucination None reported or observed  Judgment Impaired  Confusion None  Danger to Self  Current suicidal ideation? Denies  Agreement Not to Harm Self Yes  Description of Agreement Verbal  Danger to Others  Danger to Others None reported or observed

## 2023-09-27 NOTE — Plan of Care (Signed)
  Problem: Education: Goal: Knowledge of [facility] Education information/materials will improve Outcome: Progressing Goal: Emotional status will improve Outcome: Progressing Goal: Mental status will improve Outcome: Progressing Goal: Verbalization of understanding the information provided will improve Outcome: Progressing   
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
## 2023-10-02 ENCOUNTER — Encounter: Payer: Self-pay | Admitting: Family Medicine

## 2023-10-06 ENCOUNTER — Other Ambulatory Visit: Payer: Self-pay | Admitting: Family Medicine

## 2023-10-06 ENCOUNTER — Encounter: Payer: Self-pay | Admitting: Family Medicine

## 2023-10-06 ENCOUNTER — Ambulatory Visit: Payer: Medicaid Other | Admitting: Family Medicine

## 2023-10-06 VITALS — BP 142/84 | HR 88 | Ht 67.0 in | Wt 241.0 lb

## 2023-10-06 DIAGNOSIS — I480 Paroxysmal atrial fibrillation: Secondary | ICD-10-CM

## 2023-10-06 DIAGNOSIS — F411 Generalized anxiety disorder: Secondary | ICD-10-CM

## 2023-10-06 DIAGNOSIS — E1165 Type 2 diabetes mellitus with hyperglycemia: Secondary | ICD-10-CM | POA: Diagnosis not present

## 2023-10-06 DIAGNOSIS — J432 Centrilobular emphysema: Secondary | ICD-10-CM

## 2023-10-06 DIAGNOSIS — Z794 Long term (current) use of insulin: Secondary | ICD-10-CM | POA: Diagnosis not present

## 2023-10-06 DIAGNOSIS — G43909 Migraine, unspecified, not intractable, without status migrainosus: Secondary | ICD-10-CM

## 2023-10-06 DIAGNOSIS — F339 Major depressive disorder, recurrent, unspecified: Secondary | ICD-10-CM | POA: Diagnosis not present

## 2023-10-06 DIAGNOSIS — I10 Essential (primary) hypertension: Secondary | ICD-10-CM

## 2023-10-06 DIAGNOSIS — M4722 Other spondylosis with radiculopathy, cervical region: Secondary | ICD-10-CM

## 2023-10-06 DIAGNOSIS — D485 Neoplasm of uncertain behavior of skin: Secondary | ICD-10-CM

## 2023-10-06 MED ORDER — TRULICITY 1.5 MG/0.5ML ~~LOC~~ SOAJ: 1.5000 mg | SUBCUTANEOUS | 2 refills | Status: DC

## 2023-10-06 MED ORDER — AMLODIPINE BESYLATE 10 MG PO TABS: 10.0000 mg | ORAL_TABLET | Freq: Every day | ORAL | 3 refills | Status: DC

## 2023-10-06 MED ORDER — ACCU-CHEK GUIDE TEST VI STRP: ORAL_STRIP | 3 refills | Status: DC

## 2023-10-06 MED ORDER — HYDROXYZINE HCL 25 MG PO TABS: 25.0000 mg | ORAL_TABLET | Freq: Three times a day (TID) | ORAL | 0 refills | Status: DC | PRN

## 2023-10-06 MED ORDER — GABAPENTIN 300 MG PO CAPS
300.0000 mg | ORAL_CAPSULE | Freq: Three times a day (TID) | ORAL | 1 refills | Status: DC
Start: 2023-10-06 — End: 2024-01-24

## 2023-10-06 MED ORDER — ALBUTEROL SULFATE HFA 108 (90 BASE) MCG/ACT IN AERS: 2.0000 | INHALATION_SPRAY | Freq: Four times a day (QID) | RESPIRATORY_TRACT | 2 refills | Status: DC | PRN

## 2023-10-06 NOTE — Patient Instructions (Addendum)
Thank you for coming to the office today.  Recent Labs    09/21/23 0610  HGBA1C 9.0*   New rx Trulicity 1.5mg  weekly inj, check with pharmacy, we may need to do Prior Authorization  Continue Levemir 20 unit daily insulin  PAUSE Novolog mealtime insulin. Okay to use in extreme situation blood sugar 400+ and concerned about symptoms. Okay to use with meal if warranted.  Start new BP medication Amlodipine 10mg  daily, caution mild swelling feet ankles  Referral to Dermatologist in  Please schedule a Follow-up Appointment to: Return in about 3 months (around 01/04/2024) for 3 month DM A1c HTN.  If you have any other questions or concerns, please feel free to call the office or send a message through MyChart. You may also schedule an earlier appointment if necessary.  Additionally, you may be receiving a survey about your experience at our office within a few days to 1 week by e-mail or mail. We value your feedback.  Saralyn Pilar, DO [facility] [place]

## 2023-10-06 NOTE — Progress Notes (Signed)
Subjective:    Patient ID: Christy Mayer, female    DOB: 06-Jul-1966, 57 y.o.   MRN: [id number]  Christy Mayer is a 57 y.o. female presenting on 10/06/2023 for Hospitalization Follow-up (No energy) and Diabetes   HPI  Discussed the use of AI scribe software for clinical note transcription with the patient, who gave verbal consent to proceed.  History of Present Illness     The patient, with a history of diabetes and hypertension, presented for a follow-up after a recent hospital admission due to an overdose of trazodone. The patient reported experiencing constant palpitations for several months, which were associated with severe headaches. The patient also complained of extreme fatigue, joint and muscle pain, and a lack of energy despite only walking her dogs two to three times a day. The patient reported waking up in the morning feeling extremely tired and wanting to sleep all day.  The patient's blood pressure and blood sugar levels have been extremely high for several months, and she has been unable to control these levels despite dietary changes. The patient reported a loss of appetite and spending most of the day in bed due to uncontrolled blood sugar levels. The patient expressed a desire to restart Trulicity, which she believed helped control her blood sugar levels in the past. - Previous dose Trulicity 0.75mg  weekly inj, off med due to insurance issue now but has medicaid currently  The patient also reported a skin lesion on her back that has been present for about ten years and another itchy lesion on her arm that has been present for two years. The patient expressed a desire to see a dermatologist for these skin issues.  The patient's mental health was also a concern, with a recent hospital admission due to a trazodone overdose. The patient was scheduled to start therapy at a local mental health clinic [facility]. The patient's depression was reportedly exacerbated by her constant  physical discomfort and uncontrolled medical conditions.   She denies any suicidal or homicidal ideation at this time.     History of Atrial Fibrillation, with Paroxysmal episodes Symptoms of palpitations, heart racing episodic. Severe loss of energy, and fatigue Temporarily given Potassium in hospital. No other meds Needs Cardiology        10/06/2023   11:00 AM 02/15/2022    1:21 PM 04/26/2019    9:56 AM  Depression screen PHQ 2/9  Decreased Interest 1 0 1  Down, Depressed, Hopeless 0 1 0  PHQ - 2 Score 1 1 1   Altered sleeping 3 1 0  Tired, decreased energy 3 3 1   Change in appetite 3 0 0  Feeling bad or failure about yourself  0 0 0  Trouble concentrating 3 0 0  Moving slowly or fidgety/restless 0 0 0  Suicidal thoughts 0 0 0  PHQ-9 Score 13 5 2   Difficult doing work/chores  Not difficult at all Somewhat difficult       10/06/2023   11:00 AM 02/15/2022    1:21 PM 09/18/2018    1:17 PM 08/06/2018    1:24 PM  GAD 7 : Generalized Anxiety Score  Nervous, Anxious, on Edge 3 1 3 3   Control/stop worrying 0 0 1 3  Worry too much - different things 1 0 2 3  Trouble relaxing 0 0 3 3  Restless 0 0 2 3  Easily annoyed or irritable 2 0 1 3  Afraid - awful might happen 0 0 2 3  Total GAD  7 Score 6 1 14 21   Anxiety Difficulty  Not difficult at all Somewhat difficult Very difficult    Social History   Tobacco Use   Smoking status: Every Day    Current packs/day: 0.50    Average packs/day: 0.5 packs/day for 30.0 years (15.0 ttl pk-yrs)    Types: Cigarettes   Smokeless tobacco: Never  Vaping Use   Vaping status: Never Used  Substance Use Topics   Alcohol use: Yes    Alcohol/week: 0.0 standard drinks of alcohol    Comment: occasionally    Drug use: No    Review of Systems Per HPI unless specifically indicated above     Objective:    BP (!) 142/84 (BP Location: Left Arm, Cuff Size: Normal)   Pulse 88   Ht 5\' 7"  (1.702 m)   Wt 241 lb (109.3 kg)   LMP 08/11/2014    SpO2 98%   BMI 37.75 kg/m   Wt Readings from Last 3 Encounters:  10/06/23 241 lb (109.3 kg)  09/24/23 243 lb (110.2 kg)  09/21/23 253 lb 8.5 oz (115 kg)    Physical Exam Vitals and nursing note reviewed.  Constitutional:      General: She is not in acute distress.    Appearance: She is well-developed. She is not diaphoretic.     Comments: Well-appearing, comfortable, cooperative  HENT:     Head: Normocephalic and atraumatic.  Eyes:     General:        Right eye: No discharge.        Left eye: No discharge.     Conjunctiva/sclera: Conjunctivae normal.  Neck:     Thyroid: No thyromegaly.  Cardiovascular:     Rate and Rhythm: Normal rate and regular rhythm.     Heart sounds: Normal heart sounds. No murmur heard. Pulmonary:     Effort: Pulmonary effort is normal. No respiratory distress.     Breath sounds: Normal breath sounds. No wheezing or rales.  Musculoskeletal:        General: Normal range of motion.     Cervical back: Normal range of motion and neck supple.  Lymphadenopathy:     Cervical: No cervical adenopathy.  Skin:    General: Skin is warm and dry.     Findings: No erythema or rash.  Neurological:     Mental Status: She is alert and oriented to person, place, and time.  Psychiatric:        Behavior: Behavior normal.     Comments: Well groomed, good eye contact, normal speech and thoughts     Results for orders placed or performed during the hospital encounter of 09/24/23  Glucose, capillary   Collection Time: 09/24/23  1:05 PM  Result Value Ref Range   Glucose-Capillary 189 (H) 70 - 99 mg/dL  Glucose, capillary   Collection Time: 09/24/23  3:55 PM  Result Value Ref Range   Glucose-Capillary 125 (H) 70 - 99 mg/dL  Glucose, capillary   Collection Time: 09/24/23  5:17 PM  Result Value Ref Range   Glucose-Capillary 107 (H) 70 - 99 mg/dL  Glucose, capillary   Collection Time: 09/24/23  9:06 PM  Result Value Ref Range   Glucose-Capillary 184 (H) 70 - 99  mg/dL  Glucose, capillary   Collection Time: 09/25/23  5:46 AM  Result Value Ref Range   Glucose-Capillary 148 (H) 70 - 99 mg/dL   Comment 1 Notify RN    Comment 2 Document in Chart   Glucose,  capillary   Collection Time: 09/25/23 11:37 AM  Result Value Ref Range   Glucose-Capillary 156 (H) 70 - 99 mg/dL  Glucose, capillary   Collection Time: 09/25/23  5:13 PM  Result Value Ref Range   Glucose-Capillary 223 (H) 70 - 99 mg/dL  Glucose, capillary   Collection Time: 09/25/23  8:55 PM  Result Value Ref Range   Glucose-Capillary 163 (H) 70 - 99 mg/dL  Glucose, capillary   Collection Time: 09/26/23  5:59 AM  Result Value Ref Range   Glucose-Capillary 167 (H) 70 - 99 mg/dL   Comment 1 Notify RN    Comment 2 Document in Chart   Glucose, capillary   Collection Time: 09/26/23 11:49 AM  Result Value Ref Range   Glucose-Capillary 136 (H) 70 - 99 mg/dL  Glucose, capillary   Collection Time: 09/26/23  5:28 PM  Result Value Ref Range   Glucose-Capillary 211 (H) 70 - 99 mg/dL  Glucose, capillary   Collection Time: 09/26/23  9:14 PM  Result Value Ref Range   Glucose-Capillary 196 (H) 70 - 99 mg/dL  Glucose, capillary   Collection Time: 09/27/23  5:27 AM  Result Value Ref Range   Glucose-Capillary 154 (H) 70 - 99 mg/dL   Comment 1 Notify RN    Comment 2 Document in Chart       Assessment & Plan:   Problem List Items Addressed This Visit     Major depression, recurrent, chronic (HCC)   Osteoarthritis of spine with radiculopathy, cervical region   Relevant Medications   gabapentin (NEURONTIN) 300 MG capsule   Uncontrolled type 2 diabetes mellitus with hyperglycemia, with long-term current use of insulin (HCC) - Primary   Relevant Medications   ACCU-CHEK GUIDE TEST test strip   TRULICITY 1.5 MG/0.5ML SOAJ   insulin detemir (LEVEMIR) 100 UNIT/ML FlexPen   Other Visit Diagnoses       Essential hypertension       Relevant Medications   amLODipine (NORVASC) 10 MG tablet      Episodic migraine       Relevant Medications   amLODipine (NORVASC) 10 MG tablet   gabapentin (NEURONTIN) 300 MG capsule     Neoplasm of uncertain behavior of skin       Relevant Orders   Ambulatory referral to Dermatology     PAF (paroxysmal atrial fibrillation) (HCC)       Relevant Medications   amLODipine (NORVASC) 10 MG tablet   Other Relevant Orders   Ambulatory referral to Cardiology         Atrial Fibrillation Persistent palpitations and headaches. No current cardiology follow-up. -Referral to Cardiology for further evaluation and management.  Uncontrolled Diabetes A1c of 9.0 despite dietary changes. Patient reports high blood glucose readings. Previously on Trulicity 0.75mg  weekly, Levemir 20 units daily, and Novolog 5-6 units with meals. -Discontinue Novolog. -Continue Levemir 20 units daily. Adjust if indicated -Increase Trulicity to 1.5mg  weekly. New order. -Check blood glucose twice daily. New supplies for glucometer  Hypertension Elevated blood pressure readings for several months. Previously on Clonidine. -Start Amlodipine 10mg  daily. -Monitor for potential side effect of mild swelling in feet/ankles.  Chronic Pain and Fatigue Reports of joint and muscle pain, lack of energy, and desire to sleep all day. -Continue Gabapentin three times daily.  Skin Lesions Multiple longstanding skin lesions, some of which are itchy. One larger lesion on upper with ulceration an draised edges -Referral to Dermatology for evaluation and management.  Communicate with managed medicaid VBCI team  Follow-up in 3 months to assess progress and response to changes in medication regimen.      ____________________________________________________ Additional Rx Information (May be used for Prior Authorization if required)  Medication name and Strength: Trulicity 1.5mg   Primary Diagnosis and ICD10 Code: Uncontrolled Type 2 Diabetes (E11.65) Secondary Diagnosis and ICD10 Code: n/a   Previous Failed Medications Trulicity 0.75mg  Metformin 500mg  twice a day Quantity and Duration of New Medication: 2 mL for 28 days Additional Supporting Information: Dose increase from 0.75mg  up to 1.5mg  ____________________________________________________      Orders Placed This Encounter  Procedures   Ambulatory referral to Dermatology    Referral Priority:   Routine    Referral Type:   Consultation    Referral Reason:   Specialty Services Required    Requested Specialty:   Dermatology    Number of Visits Requested:   1   Ambulatory referral to Cardiology    Referral Priority:   Routine    Referral Type:   Consultation    Referral Reason:   Specialty Services Required    Number of Visits Requested:   1    Meds ordered this encounter  Medications   ACCU-CHEK GUIDE TEST test strip    Sig: Use to check blood sugar up to 2 times per day    Dispense:  200 each    Refill:  3   TRULICITY 1.5 MG/0.5ML SOAJ    Sig: Inject 1.5 mg into the skin once a week.    Dispense:  2 mL    Refill:  2   amLODipine (NORVASC) 10 MG tablet    Sig: Take 1 tablet (10 mg total) by mouth daily.    Dispense:  90 tablet    Refill:  3   gabapentin (NEURONTIN) 300 MG capsule    Sig: Take 1 capsule (300 mg total) by mouth 3 (three) times daily.    Dispense:  270 capsule    Refill:  1    Follow up plan: Return in about 3 months (around 01/04/2024) for 3 month DM A1c HTN.   Saralyn Pilar, DO [facility] 10/06/2023, 10:27 AM

## 2023-10-07 ENCOUNTER — Encounter: Payer: Self-pay | Admitting: Family Medicine

## 2023-10-09 ENCOUNTER — Telehealth: Payer: Self-pay

## 2023-10-09 NOTE — Telephone Encounter (Signed)
Evonnie Pat (Key: [id number]) PA Case ID #: [id number] Rx #: [id number] Need Help? Call us at [phone] Status sent iconSent to Plan today Drug Trulicity 1.5MG /0.5ML auto-injectors ePA cloud logo Form [employer] Electronic Prior Authorization Form [phone] NCPDP) Original Claim Info 28

## 2023-10-10 ENCOUNTER — Telehealth: Payer: Self-pay | Admitting: *Deleted

## 2023-10-10 NOTE — Patient Outreach (Signed)
  Care Management   Note  10/10/2023 Name: Christy Mayer MRN: [id number] DOB: May 13, 1966  Christy Mayer Christy Mayer is enrolled in a Managed Medicaid plan: Yes. Outreach attempt today was unsuccessful.   A HIPPA compliant phone message was left for the patient providing contact information and requesting a return call.   RNCM attempting to reach Christy Mayer regarding case management services as requested by Dr. Edman. RNCM will attempt to reach patient again over the next 7 days.  Andrea Dimes RN, BSN [place]  [facility] RN Care Coordinator [phone]

## 2023-10-10 NOTE — Telephone Encounter (Signed)
 Christy Mayer (Key: [id number]) PA Case ID #: [id number] Rx #: [id number] Need Help? Call us  at [phone] Outcome Approved on December 30 by [employer] 2017 NCPDP Request Reference Number: [id number]. TRULICITY  INJ 1.5/0.5 is approved through 10/08/2024. For further questions, call [employer] at [phone]. Authorization Expiration Date: 10/08/2024 Drug Trulicity  1.5MG /0.5ML auto-injectors ePA cloud logo Form [employer] Electronic Prior Authorization Form [phone] NCPDP) Original Claim Info 65

## 2023-10-12 ENCOUNTER — Telehealth: Payer: Self-pay | Admitting: Family Medicine

## 2023-10-12 NOTE — Telephone Encounter (Signed)
 Tammy Sours from [facility] called to update pcp on discharge report   "Unable to reach the member by telephone or letter, pt also did not attend her 10/02/2023 appt with [facility]"   Best contact: [phone] extn [phone]

## 2023-10-13 ENCOUNTER — Telehealth: Payer: Self-pay | Admitting: *Deleted

## 2023-10-13 NOTE — Telephone Encounter (Signed)
 Acknowledged. I am not aware of any further action required.  Patient will need to re-schedule with [facility]. If you have time to contact patient and reminder her to re-schedule that would be good.  Marsa Officer, DO [facility] 10/13/2023, 3:03 PM

## 2023-10-13 NOTE — Patient Outreach (Signed)
  Medicaid Managed Care   Unsuccessful Outreach Note  10/13/2023 Name: Christy Mayer MRN: [id number] DOB: [birth year]  Referred by: Edman Marsa PARAS, DO Reason for referral : No chief complaint on file.   A second unsuccessful telephone outreach was attempted today. The patient was referred to the case management team for assistance with care management and care coordination.   Follow Up Plan: A HIPAA compliant phone message was left for the patient providing contact information and requesting a return call.  The care management team will reach out to the patient again over the next 7 days.   Andrea Dimes RN, BSN [place]  [facility] RN Care Coordinator [phone]

## 2023-10-16 ENCOUNTER — Telehealth: Payer: Self-pay

## 2023-10-16 ENCOUNTER — Other Ambulatory Visit: Payer: Medicaid Other

## 2023-10-16 NOTE — Progress Notes (Signed)
..   Medicaid Managed Care   Unsuccessful Outreach Note  10/16/2023 Name: Christy Mayer MRN: [id number] DOB: 29-Jul-1966  Referred by: Edman Marsa PARAS, DO Reason for referral : High Risk Managed Medicaid (I called the patient today to get her scheduled with the MM RNCM. I left my name and number on her VM.)   An unsuccessful telephone outreach was attempted today. The patient was referred to the case management team for assistance with care management and care coordination.   Follow Up Plan: A HIPAA compliant phone message was left for the patient providing contact information and requesting a return call.  The care management team will reach out to the patient again over the next 7 days.   Delon Kleine Care Guide  [facility] Managed  Care Guide [facility]  [phone]

## 2023-10-22 ENCOUNTER — Other Ambulatory Visit: Payer: Self-pay | Admitting: Family Medicine

## 2023-10-22 DIAGNOSIS — J432 Centrilobular emphysema: Secondary | ICD-10-CM

## 2023-10-22 DIAGNOSIS — G43009 Migraine without aura, not intractable, without status migrainosus: Secondary | ICD-10-CM

## 2023-10-23 ENCOUNTER — Telehealth: Payer: Self-pay

## 2023-10-23 NOTE — Progress Notes (Signed)
..   Medicaid Managed Care   Unsuccessful Outreach Note  10/23/2023 Name: Christy Mayer MRN: [id number] DOB: 08-30-1966  Referred by: Edman Marsa PARAS, DO Reason for referral : High Risk Managed Medicaid (I called the patient today to get her missed phone appt with the MM RNCM rescheduled. I had to leave another message on her VM. This was the third attempt to reach her.)   Third unsuccessful telephone outreach was attempted today. The patient was referred to the case management team for assistance with care management and care coordination. The patient's primary care provider has been notified of our unsuccessful attempts to make or maintain contact with the patient. The care management team is pleased to engage with this patient at any time in the future should he/she be interested in assistance from the care management team.   Follow Up Plan: We have been unable to make contact with the patient for follow up. The care management team is available to follow up with the patient after provider conversation with the patient regarding recommendation for care management engagement and subsequent re-referral to the care management team.   Delon Kleine Care Guide  [facility] Managed  Care Guide [facility]  [phone]

## 2023-10-24 ENCOUNTER — Other Ambulatory Visit: Payer: Self-pay

## 2023-10-24 ENCOUNTER — Encounter: Payer: Self-pay | Admitting: Family Medicine

## 2023-10-24 DIAGNOSIS — E1165 Type 2 diabetes mellitus with hyperglycemia: Secondary | ICD-10-CM

## 2023-10-24 DIAGNOSIS — J432 Centrilobular emphysema: Secondary | ICD-10-CM

## 2023-10-24 DIAGNOSIS — G43909 Migraine, unspecified, not intractable, without status migrainosus: Secondary | ICD-10-CM

## 2023-10-24 MED ORDER — SUMATRIPTAN SUCCINATE 50 MG PO TABS: 50.0000 mg | ORAL_TABLET | Freq: Once | ORAL | 2 refills | Status: DC | PRN

## 2023-10-24 MED ORDER — SYMBICORT 160-4.5 MCG/ACT IN AERO: 2.0000 | INHALATION_SPRAY | Freq: Two times a day (BID) | RESPIRATORY_TRACT | 12 refills | Status: DC

## 2023-10-24 NOTE — Progress Notes (Signed)
 Just needed pharmacy information

## 2023-10-24 NOTE — Telephone Encounter (Signed)
 Requested Prescriptions  Refused Prescriptions Disp Refills   SUMAtriptan  (IMITREX ) 50 MG tablet [Pharmacy Med Name: SUMATRIPTAN  50MG  TABLETS] 12 tablet 3    Sig: TAKE 1 TABLET BY MOUTH FOR 1 DOSE AS NEEDED FOR HEADACHE. MAY REPEAT ONE DOSE IN 2 HOURS IF HEADACHE PERSISTE. MAX OF 2 TABLETS IN 24 HOURS     Neurology:  Migraine Therapy - Triptan Failed - 10/24/2023 12:11 PM      Failed - Last BP in normal range    BP Readings from Last 1 Encounters:  10/06/23 (!) 142/84         Passed - Valid encounter within last 12 months    Recent Outpatient Visits           2 weeks ago Uncontrolled type 2 diabetes mellitus with hyperglycemia, with long-term current use of insulin  [facility])   [place] [facility], Marsa PARAS, DO   1 year ago Episodic migraine   [place] [facility] Edman Marsa PARAS, DO   1 year ago COPD exacerbation [facility])   [place] [facility] Edman Marsa PARAS, DO   2 years ago Acute non-recurrent frontal sinusitis   [place] [facility] Edman Marsa PARAS, DO   4 years ago COPD with acute exacerbation [facility])   [place] [facility] Edman Marsa PARAS, DO       Future Appointments             In 2 months Edman, Marsa PARAS, DO [facility], [facility]             SYMBICORT  160-4.5 MCG/ACT inhaler [Pharmacy Med Name: SYMBICORT  160/4. (120 ORAL INH)] 10.2 g 5    Sig: INHALE 2 PUFFS INTO THE LUNGS DAILY     Pulmonology:  Combination Products Passed - 10/24/2023 12:11 PM      Passed - Valid encounter within last 12 months    Recent Outpatient Visits           2 weeks ago Uncontrolled type 2 diabetes mellitus with hyperglycemia, with long-term current use of insulin  [facility])   [place] [facility] [place], Marsa PARAS, DO   1 year ago Episodic migraine   [place] [facility]  Edman Marsa PARAS, DO   1 year ago COPD exacerbation [facility])   [place] [facility] Edman Marsa PARAS, DO   2 years ago Acute non-recurrent frontal sinusitis   [place] [facility] Edman Marsa PARAS, DO   4 years ago COPD with acute exacerbation [facility])   [place] [facility] Edman Marsa PARAS, DO       Future Appointments             In 2 months Edman, Marsa PARAS, DO  [facility], [facility]

## 2023-10-29 ENCOUNTER — Telehealth: Payer: Medicaid Other | Admitting: Physician Assistant

## 2023-10-29 DIAGNOSIS — S39012A Strain of muscle, fascia and tendon of lower back, initial encounter: Secondary | ICD-10-CM

## 2023-10-29 MED ORDER — CYCLOBENZAPRINE HCL 10 MG PO TABS: 10.0000 mg | ORAL_TABLET | Freq: Every day | ORAL | 0 refills | Status: DC

## 2023-10-29 NOTE — Progress Notes (Signed)

## 2023-10-29 NOTE — Progress Notes (Signed)
I have spent 5 minutes in review of e-visit questionnaire, review and updating patient chart, medical decision making and response to patient.   Laure Kidney, PA-C

## 2023-10-31 ENCOUNTER — Ambulatory Visit
Admission: RE | Admit: 2023-10-31 | Discharge: 2023-10-31 | Disposition: A | Discharge status: Home / Self Care | Payer: Medicaid Other | Source: Ambulatory Visit | Attending: Emergency Medicine | Admitting: Emergency Medicine

## 2023-10-31 ENCOUNTER — Ambulatory Visit (INDEPENDENT_AMBULATORY_CARE_PROVIDER_SITE_OTHER): Payer: Medicaid Other

## 2023-10-31 VITALS — BP 134/86 | HR 88 | Temp 99.0°F | Resp 18

## 2023-10-31 DIAGNOSIS — M545 Low back pain, unspecified: Secondary | ICD-10-CM

## 2023-10-31 MED ORDER — KETOROLAC TROMETHAMINE 30 MG/ML IJ SOLN
30.0000 mg | Freq: Once | INTRAMUSCULAR | Status: AC
Start: 2023-10-31 — End: 2023-10-31
  Administered 2023-10-31: 30 mg via INTRAMUSCULAR

## 2023-10-31 MED ORDER — PREDNISONE 20 MG PO TABS: 40.0000 mg | ORAL_TABLET | Freq: Every day | ORAL | 0 refills | Status: AC

## 2023-10-31 MED ORDER — ACETAMINOPHEN 325 MG PO TABS
975.0000 mg | ORAL_TABLET | Freq: Once | ORAL | Status: AC
  Administered 2023-10-31: 975 mg via ORAL

## 2023-10-31 MED ORDER — NAPROXEN 500 MG PO TABS: 500.0000 mg | ORAL_TABLET | Freq: Two times a day (BID) | ORAL | 0 refills | Status: DC

## 2023-10-31 MED ORDER — TIZANIDINE HCL 4 MG PO TABS: 4.0000 mg | ORAL_TABLET | Freq: Three times a day (TID) | ORAL | 0 refills | Status: DC | PRN

## 2023-10-31 NOTE — ED Triage Notes (Addendum)
Pt presents with lower back pain x 2 days. Pt was squatting to sit in a chair and jerked her body trying to avoid sitting on her cat. Pt had a virtual visit on 1/19 and prescribed Flexeril and it is not helping.

## 2023-10-31 NOTE — ED Provider Notes (Signed)
HPI  SUBJECTIVE:  Christy Mayer is a 58 y.o. female who presents with midline low back pain described as constant, dull, becoming sharp with certain movements bilateral numbness, weakness, paresthesias in both of her feet after twisting to avoid sitting on her cat 3 days ago.  She had an e-visit on 1/19 and was prescribed Flexeril without any improvement in her symptoms.  She denies direct trauma to the back.  No fevers, saddle anesthesia, urinary fecal incontinence, urinary retention, urinary complaints, pain worse at night, abdominal pain, syncope.  She states this pain is worse than her usual sciatica.  She has tried Flexeril, ibuprofen/Tylenol, has doubled her Neurontin, heating pad, and lying down.  The heating pad and lying down helps.  Symptoms are worse with exposure to cold, sitting for prolonged periods of time, large positional changes, bending forward and standing.  No antipyretic in the past 6 hours.  She has a past medical history of bilateral sciatica for which she takes Neurontin.  She also has a history of paroxysmal atrial fibrillation that is being monitored.  She is not on any anticoagulants or antiplatelets.  She has history of degenerative disc disease in her C and L-spine that is followed by her PCP,  diabetes on insulin, hypertension, asthma/COPD, continues to smoke.  No history of chronic kidney disease, cancer, multiple myeloma.  PCP: Lutricia Horsfall.   Past Medical History:  Diagnosis Date   Arthritis    Asthma    Back pain    COPD (chronic obstructive pulmonary disease) (HCC)    Emphysema lung (HCC)    Heart palpitations    Miscarriage    Mitral valve prolapse     Past Surgical History:  Procedure Laterality Date   CESAREAN SECTION  11/2007    Family History  Adopted: Yes    Social History   Tobacco Use   Smoking status: Every Day    Current packs/day: 0.50    Average packs/day: 0.5 packs/day for 30.0 years (15.0 ttl pk-yrs)    Types: Cigarettes    Smokeless tobacco: Never  Vaping Use   Vaping status: Never Used  Substance Use Topics   Alcohol use: Yes    Alcohol/week: 0.0 standard drinks of alcohol    Comment: occasionally    Drug use: No    No current facility-administered medications for this encounter.  Current Outpatient Medications:    naproxen (NAPROSYN) 500 MG tablet, Take 1 tablet (500 mg total) by mouth 2 (two) times daily., Disp: 20 tablet, Rfl: 0   predniSONE (DELTASONE) 20 MG tablet, Take 2 tablets (40 mg total) by mouth daily with breakfast for 5 days., Disp: 10 tablet, Rfl: 0   tiZANidine (ZANAFLEX) 4 MG tablet, Take 1 tablet (4 mg total) by mouth every 8 (eight) hours as needed for muscle spasms., Disp: 30 tablet, Rfl: 0   ACCU-CHEK GUIDE TEST test strip, Use to check blood sugar up to 2 times per day, Disp: 200 each, Rfl: 3   Accu-Chek Softclix Lancets lancets, Check sugar up to 2 times daily, Disp: 200 each, Rfl: 3   albuterol (PROVENTIL) (2.5 MG/3ML) 0.083% nebulizer solution, Take 3 mLs (2.5 mg total) by nebulization every 6 (six) hours as needed for shortness of breath or wheezing., Disp: 75 mL, Rfl: 0   albuterol (VENTOLIN HFA) 108 (90 Base) MCG/ACT inhaler, Inhale 2 puffs into the lungs every 6 (six) hours as needed for wheezing or shortness of breath., Disp: 8 g, Rfl: 2   Alcohol Swabs PADS,  1 Dose by Does not apply route 3 (three) times daily before meals., Disp: 200 each, Rfl: 0   amLODipine (NORVASC) 10 MG tablet, Take 1 tablet (10 mg total) by mouth daily., Disp: 90 tablet, Rfl: 3   Blood Glucose Calibration (ACCU-CHEK GUIDE CONTROL) LIQD, Use for glucometer, Disp: 1 each, Rfl: 0   Blood Glucose Monitoring Suppl (ACCU-CHEK GUIDE) w/Device KIT, Use to check blood sugar up to 2 x daily, Disp: 1 kit, Rfl: 0   buPROPion (WELLBUTRIN XL) 150 MG 24 hr tablet, Take 1 tablet (150 mg total) by mouth daily., Disp: 30 tablet, Rfl: 0   gabapentin (NEURONTIN) 300 MG capsule, Take 1 capsule (300 mg total) by mouth 3  (three) times daily., Disp: 270 capsule, Rfl: 1   hydrOXYzine (ATARAX) 25 MG tablet, Take 1 tablet (25 mg total) by mouth 3 (three) times daily as needed for anxiety., Disp: 90 tablet, Rfl: 0   insulin aspart (NOVOLOG) 100 UNIT/ML injection, Inject 5 Units into the skin 3 (three) times daily with meals., Disp: 10 mL, Rfl: 0   insulin detemir (LEVEMIR) 100 UNIT/ML FlexPen, Inject 20 Units into the skin daily., Disp: , Rfl:    Insulin Pen Needle 33G X 5 MM MISC, 1 Dose by Does not apply route 3 (three) times daily before meals., Disp: 200 each, Rfl: 0   lidocaine (LIDODERM) 5 %, Place 1 patch onto the skin daily. Remove & Discard patch within 12 hours or as directed by MD (Patient not taking: Reported on 10/06/2023), Disp: 30 patch, Rfl: 0   nicotine (NICODERM CQ - DOSED IN MG/24 HOURS) 14 mg/24hr patch, One patch chest wall daily (substitute generic) (Patient not taking: Reported on 10/06/2023), Disp: 28 patch, Rfl: 0   SUMAtriptan (IMITREX) 50 MG tablet, Take 1 tablet (50 mg total) by mouth once as needed for up to 1 dose for migraine. May repeat one dose in 2 hours if headache persists, for max dose 24 hours, Disp: 12 tablet, Rfl: 2   SYMBICORT 160-4.5 MCG/ACT inhaler, Inhale 2 puffs into the lungs 2 (two) times daily., Disp: 1 each, Rfl: 12   TRULICITY 1.5 MG/0.5ML SOAJ, Inject 1.5 mg into the skin once a week., Disp: 2 mL, Rfl: 2  Allergies  Allergen Reactions   Aspirin Nausea Only     ROS  As noted in HPI.   Physical Exam  BP 134/86 (BP Location: Left Arm)   Pulse 88   Temp 99 F (37.2 C) (Oral)   Resp 18   LMP 08/11/2014   SpO2 98%   Constitutional: Well developed, well nourished, no acute distress Eyes:  EOMI, conjunctiva normal bilaterally HENT: Normocephalic, atraumatic,mucus membranes moist Respiratory: Normal inspiratory effort Cardiovascular: Normal rate GI: nondistended. No suprapubic tenderness skin: No rash, skin intact Musculoskeletal: no CVAT.  Bilateral  paralumbar tenderness, bilateral muscle spasm.  Bony tenderness to L5, S1.  No other L-spine, SI joint, sacral bony tenderness. Bilateral lower extremities nontender, baseline ROM with intact DP pulses.  Sensation between thighs intact.  Pain with active hip flexion against resistance bilaterally.  No pain with passive int/ext rotation, flex/extension hips, AD/ABduction bilaterally. SLR neg bilaterally. Sensation baseline light touch bilaterally for Pt, DTR's symmetric and intact bilaterally KJ,  Motor symmetric bilateral 5/5 hip flexion, quadriceps, hamstrings, EHL, foot dorsiflexion, foot plantarflexion, gait somewhat antalgic but without apparent new ataxia.  Pain aggravated with large positional changes. Neurologic: Alert & oriented x 3, no focal neuro deficits Psychiatric: Speech and behavior appropriate   ED  Course   Medications  acetaminophen (TYLENOL) tablet 975 mg (975 mg Oral Given 10/31/23 1307)  ketorolac (TORADOL) 30 MG/ML injection 30 mg (30 mg Intramuscular Given 10/31/23 1307)    Orders Placed This Encounter  Procedures   DG Lumbar Spine Complete    Standing Status:   Standing    Number of Occurrences:   1    Reason for Exam (SYMPTOM  OR DIAGNOSIS REQUIRED):   Tenderness L5, S1, known degenerative disc disease.  Rule out acute changes    Is patient pregnant?:   No    No results found for this or any previous visit (from the past 24 hours). DG Lumbar Spine Complete Result Date: 10/31/2023 CLINICAL DATA:  Low back pain and tenderness. EXAM: LUMBAR SPINE - COMPLETE 4+ VIEW COMPARISON:  Lumbar spine radiograph dated 08/20/2018. FINDINGS: There is no acute fracture or subluxation of the lumbar spine. Multilevel degenerative changes with disc space narrowing and spurring most prominent at T12-L1 and L2-L3, and slightly progressed since the prior radiograph. The visualized posterior elements appear intact. The soft tissues are unremarkable. IMPRESSION: 1. No acute findings. 2.  Multilevel degenerative changes. Electronically Signed   By: Elgie Collard M.D.   On: 10/31/2023 13:49    ED Clinical Impression  1. Acute midline low back pain without sciatica     ED Assessment/Plan     Outside records reviewed.  Additional medical history obtained.  As noted in HPI.  Patient has concerning historical features of bilateral numbness and weakness although she has no weakness on exam.  Will get L-spine to rule out acute changes.  Will give Toradol/Tylenol here.  Reviewed imaging independently.  Multi level degenerative changes worse at T12/ L1, L2/ L3.  No acute findings.  Formal radiology report pending.  Discussed this with patient.  Will contact patient at [phone] if radiology overread differs enough from mine and we need to change management.    Reviewed radiology report.  No acute changes consistent with my read.  See radiology report for details.  Home with Zanaflex, discontinue Flexeril.  Naprosyn combined with Tylenol.  Continue Neurontin.  Discontinue ibuprofen/Tylenol.  Prednisone 40 mg for 5 days.  Patient states her glucose is getting under better control recently and has had steroids before without going into DKA.  Advised her that steroids will increase her blood sugar, but she states that she will adjust her insulin accordingly.  Follow-up with PCP or with orthopedics if not better in a week for advanced imaging.  Strict ER return precautions given.   Discussed imaging, MDM, treatment plan, and plan for follow-up with patient. Discussed sn/sx that should prompt return to the ED. patient agrees with plan.   Meds ordered this encounter  Medications   acetaminophen (TYLENOL) tablet 975 mg   ketorolac (TORADOL) 30 MG/ML injection 30 mg   tiZANidine (ZANAFLEX) 4 MG tablet    Sig: Take 1 tablet (4 mg total) by mouth every 8 (eight) hours as needed for muscle spasms.    Dispense:  30 tablet    Refill:  0   naproxen (NAPROSYN) 500 MG tablet    Sig:  Take 1 tablet (500 mg total) by mouth 2 (two) times daily.    Dispense:  20 tablet    Refill:  0   predniSONE (DELTASONE) 20 MG tablet    Sig: Take 2 tablets (40 mg total) by mouth daily with breakfast for 5 days.    Dispense:  10 tablet    Refill:  0    *This clinic note was created using Scientist, clinical (histocompatibility and immunogenetics). Therefore, there may be occasional mistakes despite careful proofreading.  ?     Domenick Gong, MD 11/02/23 1326

## 2023-10-31 NOTE — Discharge Instructions (Signed)
Your x-rays show that you have multilevel degenerative changes, but nothing acute.  This is per my and radiology's read.  Take the Zanaflex, discontinue Flexeril.  Finish the prednisone, and adjust your insulin accordingly.  This will elevate your sugars.  Take the Naprosyn with 1000 mg of Tylenol twice a day.  May take an additional 1000 mg of Tylenol 1 more time per day.  Heat, gentle stretching, movement is helpful.  Please follow-up with your primary care provider or with orthopedics if not getting better.  Go to the ED for pain not controlled with medications, fevers above 100.4, numbness between your thighs, if you accidentally urinate or defecate on yourself, if your leg does not work, or if you are unable to completely empty your bladder.

## 2023-11-03 ENCOUNTER — Telehealth: Payer: Medicaid Other | Admitting: Family Medicine

## 2023-11-03 DIAGNOSIS — N390 Urinary tract infection, site not specified: Secondary | ICD-10-CM

## 2023-11-03 MED ORDER — CEPHALEXIN 500 MG PO CAPS: 500.0000 mg | ORAL_CAPSULE | Freq: Two times a day (BID) | ORAL | 0 refills | Status: AC

## 2023-11-03 NOTE — Progress Notes (Signed)

## 2023-11-07 ENCOUNTER — Encounter: Payer: Self-pay | Admitting: Family Medicine

## 2023-11-10 NOTE — Telephone Encounter (Signed)
Left voicemail for patient to call back and schedule an appointment virtual or in office

## 2023-11-28 ENCOUNTER — Other Ambulatory Visit: Payer: Self-pay | Admitting: Family Medicine

## 2023-11-28 DIAGNOSIS — F411 Generalized anxiety disorder: Secondary | ICD-10-CM

## 2023-11-29 NOTE — Telephone Encounter (Signed)
 Requested Prescriptions  Pending Prescriptions Disp Refills   hydrOXYzine (ATARAX) 25 MG tablet [Pharmacy Med Name: HYDROXYZINE HCL 25MG  TABS (WHITE)] 270 tablet 0    Sig: TAKE 1 TABLET(25 MG) BY MOUTH THREE TIMES DAILY AS NEEDED FOR ANXIETY     Ear, Nose, and Throat:  Antihistamines 2 Passed - 11/29/2023 12:59 PM      Passed - Cr in normal range and within 360 days    Creat  Date Value Ref Range Status  07/30/2018 0.89 0.50 - 1.05 mg/dL Final    Comment:    For patients >62 years of age, the reference limit for Creatinine is approximately 13% higher for people identified as African-American. .    Creatinine, Ser  Date Value Ref Range Status  09/22/2023 0.65 0.44 - 1.00 mg/dL Final         Passed - Valid encounter within last 12 months    Recent Outpatient Visits           1 month ago Uncontrolled type 2 diabetes mellitus with hyperglycemia, with long-term current use of insulin [facility]   [facility] [place], Netta Neat, DO   1 year ago Episodic migraine   [place] [facility] Smitty Cords, DO   1 year ago COPD exacerbation [facility])   [place] [facility] Smitty Cords, DO   2 years ago Acute non-recurrent frontal sinusitis   [place] [facility] Smitty Cords, DO   4 years ago COPD with acute exacerbation [facility])   [place] [facility] Smitty Cords, DO       Future Appointments             In 1 month Althea Charon, Netta Neat, DO [place] [facility], [facility]

## 2023-12-04 ENCOUNTER — Encounter: Payer: Self-pay | Admitting: Family Medicine

## 2023-12-12 ENCOUNTER — Ambulatory Visit: Payer: Self-pay | Admitting: Family Medicine

## 2023-12-12 ENCOUNTER — Ambulatory Visit: Admitting: Family Medicine

## 2023-12-12 NOTE — Telephone Encounter (Signed)
  Chief Complaint: Migraine Headache Symptoms: fatigue, nausea, vomiting, no appetite, aches and pains and dizziness when standing Frequency: Symptoms have been going on for at least two weeks Pertinent Negatives: Patient denies fever Disposition: [] ED /[] Urgent Care (no appt availability in office) / [x] Appointment(In office/virtual)/ []  [place] [facility]/ [] Home Care/ [] Refused Recommended Disposition /[] [place] [facility]/ []  Follow-up with PCP Additional Notes: patient called for concerns for continued migraine headache. Symptoms include fatigue, nausea, vomiting, no appetite, aches and pains and dizziness when standing. Patient states symptoms have been going on for awhile. She had an appointment today with PCP but had to cancel. Per protocol, acute appointment made with PCP for 12/13/2023 at 3:00 pm. Patient verbalized understanding of plan and all questions answered.    Copied from CRM [id number]. Topic: Clinical - Red Word Triage >> Dec 12, 2023 11:10 AM Elle L wrote: Red Word that prompted transfer to Nurse Triage: The patient is unable to make it to her appointment today and called to reschedule. However, she states that she has been sick since Feburary 4th and it continues to worsen. She has fatigue, nausea, vomiting, no appetite, severe migraines, aches and pain in joints, and she is dizzy when she stands up. Reason for Disposition  [1] MODERATE headache (e.g., interferes with normal activities) AND [2] present > 24 hours AND [3] unexplained  (Exceptions: analgesics not tried, typical migraine, or headache part of viral illness)  Answer Assessment - Initial Assessment Questions 1. LOCATION: "Where does it hurt?"      Back of left side of head and radiates to the front of her head 2. ONSET: "When did the headache start?" (Minutes, hours or days)      Started initially Feb 4th and have increased since 3. PATTERN: "Does the pain come and go, or has it been constant since it  started?"     constant 4. SEVERITY: "How bad is the pain?" and "What does it keep you from doing?"  (e.g., Scale 1-10; mild, moderate, or severe)   - MILD (1-3): doesn't interfere with normal activities    - MODERATE (4-7): interferes with normal activities or awakens from sleep    - SEVERE (8-10): excruciating pain, unable to do any normal activities        8 to 9 out of 10 5. RECURRENT SYMPTOM: "Have you ever had headaches before?" If Yes, ask: "When was the last time?" and "What happened that time?"      Yes 6. CAUSE: "What do you think is causing the headache?"     Been sick since Feb 7. MIGRAINE: "Have you been diagnosed with migraine headaches?" If Yes, ask: "Is this headache similar?"      Yes 8. HEAD INJURY: "Has there been any recent injury to the head?"      No 9. OTHER SYMPTOMS: "Do you have any other symptoms?" (fever, stiff neck, eye pain, sore throat, cold symptoms)     Fatigue, nausea, vomiting, no appetite, aches and pain in joints/dizziness  Protocols used: Headache-A-AH

## 2023-12-13 ENCOUNTER — Ambulatory Visit: Admitting: Family Medicine

## 2023-12-15 ENCOUNTER — Ambulatory Visit: Admitting: Family Medicine

## 2023-12-15 ENCOUNTER — Telehealth: Payer: Self-pay

## 2023-12-15 VITALS — BP 136/89 | HR 75 | Temp 98.9°F | Resp 16 | Ht 67.0 in | Wt 238.7 lb

## 2023-12-15 DIAGNOSIS — R42 Dizziness and giddiness: Secondary | ICD-10-CM | POA: Diagnosis not present

## 2023-12-15 DIAGNOSIS — G43909 Migraine, unspecified, not intractable, without status migrainosus: Secondary | ICD-10-CM

## 2023-12-15 DIAGNOSIS — Z8616 Personal history of COVID-19: Secondary | ICD-10-CM | POA: Diagnosis not present

## 2023-12-15 DIAGNOSIS — R110 Nausea: Secondary | ICD-10-CM | POA: Diagnosis not present

## 2023-12-15 DIAGNOSIS — R630 Anorexia: Secondary | ICD-10-CM

## 2023-12-15 MED ORDER — UBRELVY 100 MG PO TABS: 1.0000 | ORAL_TABLET | Freq: Every day | ORAL | Status: DC

## 2023-12-15 MED ORDER — NURTEC 75 MG PO TBDP: 75.0000 mg | ORAL_TABLET | Freq: Every day | ORAL | 2 refills | Status: DC | PRN

## 2023-12-15 MED ORDER — ONDANSETRON 4 MG PO TBDP: 4.0000 mg | ORAL_TABLET | Freq: Three times a day (TID) | ORAL | 0 refills | Status: DC | PRN

## 2023-12-15 NOTE — Progress Notes (Signed)
 Subjective:    Patient ID: Christy Mayer, female    DOB: Dec 25, 1965, 58 y.o.   MRN: [id number]  Christy Mayer is a 58 y.o. female presenting on 12/15/2023 for Headache (Severe Migraine Tristan Schroeder Barron Alvine and vomiting every 3 days since 11/21/23/LOA since Covid 11/2023.)   HPI  Discussed the use of AI scribe software for clinical note transcription with the patient, who gave verbal consent to proceed.  History of Present Illness   The patient presents with persistent migraine, vertigo, and gastrointestinal symptoms.  She has been experiencing persistent migraine headaches for the past month, accompanied by vertigo, dizziness, and photophobia. These symptoms are severe enough to prevent her from working, particularly affecting her ability to look at a computer screen. Lying down provides relief. She has been using ibuprofen, Tylenol, and Benadryl, which offer some relief but require her to sleep.  In addition to the migraines, she has gastrointestinal symptoms including nausea, vomiting, diarrhea, and anorexia. She describes being 'lucky if I eat two or three bites of food a day.' These symptoms began with COVID viral illness onset February 4th with a sore throat and dull headache, initially thought to be a viral illness. The symptoms persist in a cyclical pattern, with episodes of severe nausea and inability to keep food down occurring every two to four days.  She has remained out of work unable to work since 11/20/23  Her current medication regimen includes sumatriptan, taken in a two-dose sequence as previously advised, but it is no longer effective. She is out of sumatriptan and typically has several doses left over at the end of a cycle, but not this time. She recalls being on Zofran for nausea in the past, which was effective.  She mentions a history of hospitalization for two weeks with severe pneumonia two years ago, after which she feels her immune system has been compromised, making  her more susceptible to illnesses. She has tried supplements like Airborne and Oscillococcinum to boost her immunity without success.  Her father, aged 86, recently had COVID-19, which resolved in two to three days, contrasting with her prolonged illness.         12/15/2023   11:08 AM 10/06/2023   11:00 AM 02/15/2022    1:21 PM  Depression screen PHQ 2/9  Decreased Interest 0 1 0  Down, Depressed, Hopeless 0 0 1  PHQ - 2 Score 0 1 1  Altered sleeping  3 1  Tired, decreased energy  3 3  Change in appetite  3 0  Feeling bad or failure about yourself   0 0  Trouble concentrating  3 0  Moving slowly or fidgety/restless  0 0  Suicidal thoughts  0 0  PHQ-9 Score  13 5  Difficult doing work/chores   Not difficult at all       10/06/2023   11:00 AM 02/15/2022    1:21 PM 09/18/2018    1:17 PM 08/06/2018    1:24 PM  GAD 7 : Generalized Anxiety Score  Nervous, Anxious, on Edge 3 1 3 3   Control/stop worrying 0 0 1 3  Worry too much - different things 1 0 2 3  Trouble relaxing 0 0 3 3  Restless 0 0 2 3  Easily annoyed or irritable 2 0 1 3  Afraid - awful might happen 0 0 2 3  Total GAD 7 Score 6 1 14 21   Anxiety Difficulty  Not difficult at all Somewhat difficult Very difficult  Social History   Tobacco Use   Smoking status: Every Day    Current packs/day: 0.50    Average packs/day: 0.5 packs/day for 30.0 years (15.0 ttl pk-yrs)    Types: Cigarettes    Passive exposure: Never   Smokeless tobacco: Never  Vaping Use   Vaping status: Never Used  Substance Use Topics   Alcohol use: Yes    Alcohol/week: 0.0 standard drinks of alcohol    Comment: occasionally    Drug use: No    Review of Systems Per HPI unless specifically indicated above     Objective:    BP 136/89   Pulse 75   Temp 98.9 F (37.2 C) (Oral)   Resp 16   Ht 5\' 7"  (1.702 m)   Wt 238 lb 11.2 oz (108.3 kg)   LMP 08/11/2014   SpO2 100%   BMI 37.39 kg/m   Wt Readings from Last 3 Encounters:  12/15/23  238 lb 11.2 oz (108.3 kg)  10/06/23 241 lb (109.3 kg)  09/21/23 253 lb 8.5 oz (115 kg)    Physical Exam Vitals and nursing note reviewed.  Constitutional:      General: She is not in acute distress.    Appearance: Normal appearance. She is well-developed. She is not diaphoretic.     Comments: Well-appearing, uncomfortable due to headache, cooperative  HENT:     Head: Normocephalic and atraumatic.  Eyes:     General:        Right eye: No discharge.        Left eye: No discharge.     Conjunctiva/sclera: Conjunctivae normal.  Neck:     Thyroid: No thyromegaly.  Cardiovascular:     Rate and Rhythm: Normal rate and regular rhythm.     Heart sounds: Normal heart sounds. No murmur heard. Pulmonary:     Effort: Pulmonary effort is normal. No respiratory distress.     Breath sounds: Normal breath sounds. No wheezing or rales.  Musculoskeletal:        General: Normal range of motion.     Cervical back: Normal range of motion and neck supple.  Lymphadenopathy:     Cervical: No cervical adenopathy.  Skin:    General: Skin is warm and dry.     Findings: No erythema or rash.  Neurological:     Mental Status: She is alert and oriented to person, place, and time.  Psychiatric:        Mood and Affect: Mood normal.        Behavior: Behavior normal.        Thought Content: Thought content normal.     Comments: Well groomed, good eye contact, normal speech and thoughts     Results for orders placed or performed during the hospital encounter of 09/21/23  Comprehensive metabolic panel   Collection Time: 09/21/23 12:53 AM  Result Value Ref Range   Sodium 133 (L) 135 - 145 mmol/L   Potassium 2.9 (L) 3.5 - 5.1 mmol/L   Chloride 96 (L) 98 - 111 mmol/L   CO2 22 22 - 32 mmol/L   Glucose, Bld 318 (H) 70 - 99 mg/dL   BUN 15 6 - 20 mg/dL   Creatinine, Ser 1.61 0.44 - 1.00 mg/dL   Calcium 8.7 (L) 8.9 - 10.3 mg/dL   Total Protein 7.7 6.5 - 8.1 g/dL   Albumin 4.2 3.5 - 5.0 g/dL   AST 17 15 -  41 U/L   ALT 26 0 - 44 U/L  Alkaline Phosphatase 80 38 - 126 U/L   Total Bilirubin 0.6 <1.2 mg/dL   GFR, Estimated >81 >19 mL/min   Anion gap 15 5 - 15  Ethanol   Collection Time: 09/21/23 12:53 AM  Result Value Ref Range   Alcohol, Ethyl (B) 152 (H) <10 mg/dL  Salicylate level   Collection Time: 09/21/23 12:53 AM  Result Value Ref Range   Salicylate Lvl <7.0 (L) 7.0 - 30.0 mg/dL  Acetaminophen level   Collection Time: 09/21/23 12:53 AM  Result Value Ref Range   Acetaminophen (Tylenol), Serum <10 (L) 10 - 30 ug/mL  cbc   Collection Time: 09/21/23 12:53 AM  Result Value Ref Range   WBC 6.8 4.0 - 10.5 K/uL   RBC 4.55 3.87 - 5.11 MIL/uL   Hemoglobin 15.1 (H) 12.0 - 15.0 g/dL   HCT 14.7 82.9 - 56.2 %   MCV 96.0 80.0 - 100.0 fL   MCH 33.2 26.0 - 34.0 pg   MCHC 34.6 30.0 - 36.0 g/dL   RDW 13.0 (L) 86.5 - 78.4 %   Platelets 189 150 - 400 K/uL   nRBC 0.0 0.0 - 0.2 %  Acetaminophen level   Collection Time: 09/21/23  6:10 AM  Result Value Ref Range   Acetaminophen (Tylenol), Serum <10 (L) 10 - 30 ug/mL  Salicylate level   Collection Time: 09/21/23  6:10 AM  Result Value Ref Range   Salicylate Lvl <7.0 (L) 7.0 - 30.0 mg/dL  HIV Antibody (routine testing w rflx)   Collection Time: 09/21/23  6:10 AM  Result Value Ref Range   HIV Screen 4th Generation wRfx Non Reactive Non Reactive  Hemoglobin A1c   Collection Time: 09/21/23  6:10 AM  Result Value Ref Range   Hgb A1c MFr Bld 9.0 (H) 4.8 - 5.6 %   Mean Plasma Glucose 211.6 mg/dL  CBG monitoring, ED   Collection Time: 09/21/23  8:09 AM  Result Value Ref Range   Glucose-Capillary 269 (H) 70 - 99 mg/dL  Basic metabolic panel   Collection Time: 09/21/23 10:26 AM  Result Value Ref Range   Sodium 133 (L) 135 - 145 mmol/L   Potassium 5.4 (H) 3.5 - 5.1 mmol/L   Chloride 102 98 - 111 mmol/L   CO2 24 22 - 32 mmol/L   Glucose, Bld 278 (H) 70 - 99 mg/dL   BUN 15 6 - 20 mg/dL   Creatinine, Ser 6.96 0.44 - 1.00 mg/dL   Calcium 8.2  (L) 8.9 - 10.3 mg/dL   GFR, Estimated >29 >52 mL/min   Anion gap 7 5 - 15  Magnesium   Collection Time: 09/21/23 10:26 AM  Result Value Ref Range   Magnesium 1.8 1.7 - 2.4 mg/dL  CBG monitoring, ED   Collection Time: 09/21/23 12:24 PM  Result Value Ref Range   Glucose-Capillary 188 (H) 70 - 99 mg/dL  CBG monitoring, ED   Collection Time: 09/21/23  4:28 PM  Result Value Ref Range   Glucose-Capillary 177 (H) 70 - 99 mg/dL  Potassium   Collection Time: 09/21/23  7:04 PM  Result Value Ref Range   Potassium 3.8 3.5 - 5.1 mmol/L  CBG monitoring, ED   Collection Time: 09/21/23  9:35 PM  Result Value Ref Range   Glucose-Capillary 262 (H) 70 - 99 mg/dL  CBC   Collection Time: 09/22/23  5:20 AM  Result Value Ref Range   WBC 8.6 4.0 - 10.5 K/uL   RBC 3.82 (L) 3.87 - 5.11 MIL/uL  Hemoglobin 12.7 12.0 - 15.0 g/dL   HCT 86.5 78.4 - 69.6 %   MCV 99.5 80.0 - 100.0 fL   MCH 33.2 26.0 - 34.0 pg   MCHC 33.4 30.0 - 36.0 g/dL   RDW 29.5 28.4 - 13.2 %   Platelets 189 150 - 400 K/uL   nRBC 0.0 0.0 - 0.2 %  Basic metabolic panel   Collection Time: 09/22/23  5:20 AM  Result Value Ref Range   Sodium 134 (L) 135 - 145 mmol/L   Potassium 3.9 3.5 - 5.1 mmol/L   Chloride 104 98 - 111 mmol/L   CO2 25 22 - 32 mmol/L   Glucose, Bld 197 (H) 70 - 99 mg/dL   BUN 12 6 - 20 mg/dL   Creatinine, Ser 4.40 0.44 - 1.00 mg/dL   Calcium 7.9 (L) 8.9 - 10.3 mg/dL   GFR, Estimated >10 >27 mL/min   Anion gap 5 5 - 15  Magnesium   Collection Time: 09/22/23  5:20 AM  Result Value Ref Range   Magnesium 2.2 1.7 - 2.4 mg/dL  CBG monitoring, ED   Collection Time: 09/22/23  8:33 AM  Result Value Ref Range   Glucose-Capillary 212 (H) 70 - 99 mg/dL  Urinalysis, Routine w reflex microscopic -Urine, Clean Catch   Collection Time: 09/22/23  8:59 AM  Result Value Ref Range   Color, Urine YELLOW (A) YELLOW   APPearance HAZY (A) CLEAR   Specific Gravity, Urine 1.010 1.005 - 1.030   pH 5.0 5.0 - 8.0   Glucose, UA  NEGATIVE NEGATIVE mg/dL   Hgb urine dipstick NEGATIVE NEGATIVE   Bilirubin Urine NEGATIVE NEGATIVE   Ketones, ur NEGATIVE NEGATIVE mg/dL   Protein, ur NEGATIVE NEGATIVE mg/dL   Nitrite NEGATIVE NEGATIVE   Leukocytes,Ua NEGATIVE NEGATIVE   RBC / HPF 0-5 0 - 5 RBC/hpf   WBC, UA 0-5 0 - 5 WBC/hpf   Bacteria, UA RARE (A) NONE SEEN   Squamous Epithelial / HPF 6-10 0 - 5 /HPF  Urine Drug Screen, Qualitative   Collection Time: 09/22/23  8:59 AM  Result Value Ref Range   Tricyclic, Ur Screen NONE DETECTED NONE DETECTED   Amphetamines, Ur Screen NONE DETECTED NONE DETECTED   MDMA (Ecstasy)Ur Screen NONE DETECTED NONE DETECTED   Cocaine Metabolite,Ur [place] NONE DETECTED NONE DETECTED   Opiate, Ur Screen NONE DETECTED NONE DETECTED   Phencyclidine (PCP) Ur S NONE DETECTED NONE DETECTED   Cannabinoid 50 Ng, Ur [place] NONE DETECTED NONE DETECTED   Barbiturates, Ur Screen NONE DETECTED NONE DETECTED   Benzodiazepine, Ur Scrn NONE DETECTED NONE DETECTED   Methadone Scn, Ur NONE DETECTED NONE DETECTED  ECHOCARDIOGRAM COMPLETE   Collection Time: 09/22/23  9:57 AM  Result Value Ref Range   Weight 4,056.46 oz   Height 67 in   BP 117/64 mmHg   Ao pk vel 1.79 m/s   AV Area VTI 2.93 cm2   AR max vel 2.56 cm2   AV Mean grad 6.0 mmHg   AV Peak grad 12.8 mmHg   Single Plane A2C EF 59.1 %   Single Plane A4C EF 66.0 %   Calc EF 62.3 %   S' Lateral 2.90 cm   AV Area mean vel 2.52 cm2   Area-P 1/2 3.10 cm2   MV VTI 3.01 cm2   Est EF 65 - 70%   CBG monitoring, ED   Collection Time: 09/22/23 11:39 AM  Result Value Ref Range   Glucose-Capillary 122 (H)  70 - 99 mg/dL  CBG monitoring, ED   Collection Time: 09/22/23  4:44 PM  Result Value Ref Range   Glucose-Capillary 150 (H) 70 - 99 mg/dL  CBG monitoring, ED   Collection Time: 09/22/23 11:20 PM  Result Value Ref Range   Glucose-Capillary 191 (H) 70 - 99 mg/dL  CBG monitoring, ED   Collection Time: 09/23/23  7:57 AM  Result Value Ref Range    Glucose-Capillary 187 (H) 70 - 99 mg/dL   Comment 1 Notify RN    Comment 2 Document in Chart   CBG monitoring, ED   Collection Time: 09/23/23 11:37 AM  Result Value Ref Range   Glucose-Capillary 202 (H) 70 - 99 mg/dL  CBG monitoring, ED   Collection Time: 09/23/23  4:43 PM  Result Value Ref Range   Glucose-Capillary 120 (H) 70 - 99 mg/dL  CBG monitoring, ED   Collection Time: 09/23/23  9:08 PM  Result Value Ref Range   Glucose-Capillary 163 (H) 70 - 99 mg/dL  CBG monitoring, ED   Collection Time: 09/24/23  7:42 AM  Result Value Ref Range   Glucose-Capillary 178 (H) 70 - 99 mg/dL      Assessment & Plan:   Problem List Items Addressed This Visit     Episodic migraine - Primary   Relevant Medications   NURTEC 75 MG TBDP   Ubrogepant (UBRELVY) 100 MG TABS   Other Visit Diagnoses       Nausea       Relevant Medications   ondansetron (ZOFRAN-ODT) 4 MG disintegrating tablet     Vertigo         History of COVID-19         Decreased appetite            Episodic Migraine Onset worse since COVID case 1 month ago >4 but < 14 headache days per month, Episodic and severe migraines, not adequately controlled with current regimen of sumatriptan, ibuprofen, Tylenol, and Benadryl. Discussed the need for more effective migraine management to break the cycle of symptoms.  Failed Sumatriptan -Order Nurtec ODT 75mg , to be taken every 48 hours as needed for migraines. [facility] Has Sumatriptan AS NEEDED refill available Sample Ubrelvy 100mg  1 pill today since we are OUT of Nurtec while waiting on her to receive from pharmacy  Nausea and Vomiting Recurrent episodes of nausea and vomiting, contributing to decreased food intake and overall malaise. Likely related to migraines and possibly exacerbated by inconsistent food intake. -Order Zofran ODT for nausea, to be taken as needed to improve appetite and reduce nausea.  General Health Maintenance -Encourage patient to maintain  adequate hydration and attempt regular, small meals when possible. -Follow up with patient regarding the effectiveness of the new migraine medication Nurtec. -If short-term disability paperwork is received, assist patient in completion, noting the persistent migraines since February 10th.         No orders of the defined types were placed in this encounter.   Meds ordered this encounter  Medications   NURTEC 75 MG TBDP    Sig: Take 1 tablet (75 mg total) by mouth daily as needed (migraine headache). Max 1 tablet in 24 hours.    Dispense:  8 tablet    Refill:  2   ondansetron (ZOFRAN-ODT) 4 MG disintegrating tablet    Sig: Take 1 tablet (4 mg total) by mouth every 8 (eight) hours as needed for nausea or vomiting.    Dispense:  30 tablet  Refill:  0   Ubrogepant (UBRELVY) 100 MG TABS    Sig: Take 1 tablet (100 mg total) by mouth daily.    Lot Number?:   [id number]    Expiration Date?:   10/16/2024    Quantity:   1             box    Follow up plan: Return if symptoms worsen or fail to improve.   Saralyn Pilar, DO [facility] 12/15/2023, 11:24 AM

## 2023-12-15 NOTE — Patient Instructions (Addendum)
 Thank you for coming to the office today.  Take the Nurtec ODT 75mg  every 48 hours as needed for migraine stopping  [facility] pharmacy, stay tuned for them to call and setup. Or you can contact them next week  Okay to take the Sumatriptan within 48 hours if needed.  Okay to Zofran as needed as well for nausea and improve appetite.   Please schedule a Follow-up Appointment to: Return if symptoms worsen or fail to improve.  If you have any other questions or concerns, please feel free to call the office or send a message through MyChart. You may also schedule an earlier appointment if necessary.  Additionally, you may be receiving a survey about your experience at our office within a few days to 1 week by e-mail or mail. We value your feedback.  Saralyn Pilar, DO [facility], [place]

## 2023-12-15 NOTE — Telephone Encounter (Signed)
 Christy Mayer (Key: [id number]) Need Help? Call us at [phone] Status New (Not sent to plan) Drug Nurtec 75MG  dispersible tablets ePA cloud logo Form [employer] Electronic Prior Authorization Form [phone] NCPDP)

## 2023-12-19 ENCOUNTER — Telehealth: Payer: Self-pay

## 2023-12-19 NOTE — Telephone Encounter (Signed)
 Copied from CRM [id number]. Topic: Clinical - Prescription Issue >> Dec 19, 2023 11:12 AM Ivette P wrote: Reason for CRM: [place] called in to see if Prior authorization for medication NURTEC 75 MG TBDP was approved. Requesting a status follow up  Callback [id number] option 1

## 2023-12-19 NOTE — Telephone Encounter (Signed)
 Followed up with patient. We were under the impression PA wasn't needed per patient.

## 2023-12-20 ENCOUNTER — Telehealth: Payer: Self-pay

## 2023-12-20 NOTE — Telephone Encounter (Signed)
 See previous messages on Nurtec

## 2023-12-25 ENCOUNTER — Encounter: Payer: Self-pay | Admitting: Family Medicine

## 2023-12-25 DIAGNOSIS — M4722 Other spondylosis with radiculopathy, cervical region: Secondary | ICD-10-CM

## 2023-12-25 DIAGNOSIS — M51362 Other intervertebral disc degeneration, lumbar region with discogenic back pain and lower extremity pain: Secondary | ICD-10-CM

## 2023-12-25 DIAGNOSIS — M5030 Other cervical disc degeneration, unspecified cervical region: Secondary | ICD-10-CM

## 2023-12-25 DIAGNOSIS — G43909 Migraine, unspecified, not intractable, without status migrainosus: Secondary | ICD-10-CM

## 2023-12-25 DIAGNOSIS — D485 Neoplasm of uncertain behavior of skin: Secondary | ICD-10-CM

## 2023-12-25 DIAGNOSIS — G8929 Other chronic pain: Secondary | ICD-10-CM

## 2023-12-25 DIAGNOSIS — I10 Essential (primary) hypertension: Secondary | ICD-10-CM

## 2023-12-25 DIAGNOSIS — I480 Paroxysmal atrial fibrillation: Secondary | ICD-10-CM

## 2023-12-25 DIAGNOSIS — E1165 Type 2 diabetes mellitus with hyperglycemia: Secondary | ICD-10-CM

## 2023-12-25 MED ORDER — SUMATRIPTAN SUCCINATE 100 MG PO TABS: 100.0000 mg | ORAL_TABLET | ORAL | 2 refills | Status: DC | PRN

## 2023-12-27 NOTE — Addendum Note (Signed)
 Addended by: Smitty Cords on: 12/27/2023 05:06 PM   Modules accepted: Orders

## 2023-12-28 ENCOUNTER — Telehealth: Payer: Self-pay

## 2023-12-28 NOTE — Progress Notes (Signed)
..   Medicaid Managed Care   Unsuccessful Outreach Note  12/28/2023 Name: Christy Mayer MRN: [id number] DOB: [birth year]  Referred by: Smitty Cords, DO Reason for referral : High Risk Managed Medicaid (Initial call made to the patient today to get her scheduled with the [facility] and BSW. I had to leave a message on her VM.)   An unsuccessful telephone outreach was attempted today. The patient was referred to the case management team for assistance with care management and care coordination.   Follow Up Plan: A HIPAA compliant phone message was left for the patient providing contact information and requesting a return call.  The care management team will reach out to the patient again over the next 7 days.   Weston Settle [facility], [facility] Guide Direct Dial: [phone]  Fax: [phone]

## 2024-01-01 ENCOUNTER — Telehealth: Payer: Self-pay

## 2024-01-01 NOTE — Progress Notes (Signed)
 Complex Care Management Note Care Guide Note  01/01/2024 Name: Christy Mayer MRN: [id number] DOB: [birth year]   Complex Care Management Outreach Attempts: A third unsuccessful outreach was attempted today to offer the patient with information about available complex care management services.  Follow Up Plan:  No further outreach attempts will be made at this time. We have been unable to contact the patient to offer or enroll patient in complex care management services.  Encounter Outcome:  No Answer  Penne Lash , RMA     [place]  [facility], [facility] Guide  Direct Dial: [phone]  Website: [email or web address]

## 2024-01-03 ENCOUNTER — Telehealth: Payer: Self-pay

## 2024-01-03 DIAGNOSIS — G43909 Migraine, unspecified, not intractable, without status migrainosus: Secondary | ICD-10-CM

## 2024-01-03 NOTE — Telephone Encounter (Signed)
 Nurtec was denied by insurance. She will have to had tried and failed 2 -triptans. Paperwork is scanned in for denial

## 2024-01-03 NOTE — Telephone Encounter (Signed)
 I replied to the mychart message chain about this. I advised her to notify us when ready to fill new migraine med, we would send Rizatriptan next, and after this she can then be eligible to re-try the Nurtec authorization. She currently was just filled on Sumatriptan so when ready we can switch to Rizatriptan.  Also - any update on her insurance card? I am waiting on that to place Neurology referral  Saralyn Pilar, DO [facility] 01/03/2024, 1:09 PM

## 2024-01-05 ENCOUNTER — Telehealth: Payer: Self-pay

## 2024-01-05 ENCOUNTER — Ambulatory Visit: Payer: Self-pay | Admitting: Family Medicine

## 2024-01-05 NOTE — Telephone Encounter (Signed)
 Copied from CRM [id number]. Topic: General - Other >> Jan 05, 2024  1:50 PM Marland Kitchen D wrote: Stanton Kidney with [facility] called to see if the office received the fax they sent over please call back at-[phone]

## 2024-01-08 ENCOUNTER — Other Ambulatory Visit: Payer: Self-pay | Admitting: Family Medicine

## 2024-01-08 DIAGNOSIS — M4722 Other spondylosis with radiculopathy, cervical region: Secondary | ICD-10-CM

## 2024-01-08 DIAGNOSIS — G43909 Migraine, unspecified, not intractable, without status migrainosus: Secondary | ICD-10-CM

## 2024-01-10 MED ORDER — RIZATRIPTAN BENZOATE 10 MG PO TBDP: 10.0000 mg | ORAL_TABLET | ORAL | 2 refills | Status: DC | PRN

## 2024-01-10 NOTE — Addendum Note (Signed)
 Addended by: Smitty Cords on: 01/10/2024 05:24 PM   Modules accepted: Orders

## 2024-01-12 NOTE — Addendum Note (Signed)
 Addended by: Smitty Cords on: 01/12/2024 04:42 PM   Modules accepted: Orders

## 2024-01-16 ENCOUNTER — Telehealth: Payer: Self-pay

## 2024-01-16 NOTE — Telephone Encounter (Signed)
 Copied from CRM [id number]. Topic: General - Other >> Jan 15, 2024  5:11 PM Emylou G wrote: Natalia Leatherwood w/[facility] w/[employer] Nurtec 75 mg.. checking status of prior auth [id number]

## 2024-01-17 ENCOUNTER — Encounter: Payer: Self-pay | Admitting: Family Medicine

## 2024-01-17 NOTE — Progress Notes (Unsigned)
 We received a new fax today 01/17/24 from [facility] for the Short Term Disability Claim #[id number]  They request office treatment notes from 12/23/23 to present.  There are no new office visit notes from 12/23/23. It looks like the last batch of Short Term Disability paperwork was sent on or after 3/21. I am unsure why they need additional paperwork so soon.  They need to receive updated chart notes by 01/24/24. Since we received this on 4/9, it only gives [place] 1 week to do this.  Only options are to call and notify [facility] that we do not have any additional office visit notes from 12/23/23 onward, or schedule her to be seen ASAP before 4/16 so we can send a new office visit note.  Saralyn Pilar, DO [facility] 01/17/2024, 6:21 PM

## 2024-01-18 ENCOUNTER — Telehealth: Payer: Self-pay

## 2024-01-18 DIAGNOSIS — L570 Actinic keratosis: Secondary | ICD-10-CM | POA: Diagnosis not present

## 2024-01-18 DIAGNOSIS — D170 Benign lipomatous neoplasm of skin and subcutaneous tissue of head, face and neck: Secondary | ICD-10-CM | POA: Diagnosis not present

## 2024-01-18 DIAGNOSIS — D485 Neoplasm of uncertain behavior of skin: Secondary | ICD-10-CM | POA: Diagnosis not present

## 2024-01-18 NOTE — Addendum Note (Signed)
 Addended by: Smitty Cords on: 01/18/2024 01:29 PM   Modules accepted: Orders

## 2024-01-18 NOTE — Progress Notes (Signed)
 LM for Christy Mayer at [facility]. To return my call

## 2024-01-18 NOTE — Telephone Encounter (Signed)
 Copied from CRM [id number]. Topic: General - Other >> Jan 17, 2024  5:37 PM Everette C wrote: Reason for CRM: Christy Mayer with [facility] has called to request that additional office notes be submitted to their organization by fax at [phone] for the patient   Please contact [facility] by phone at [phone] if needed further

## 2024-01-23 ENCOUNTER — Other Ambulatory Visit: Payer: Self-pay | Admitting: Family Medicine

## 2024-01-23 DIAGNOSIS — M4722 Other spondylosis with radiculopathy, cervical region: Secondary | ICD-10-CM

## 2024-01-23 DIAGNOSIS — G43909 Migraine, unspecified, not intractable, without status migrainosus: Secondary | ICD-10-CM

## 2024-01-24 ENCOUNTER — Ambulatory Visit: Admitting: Cardiology

## 2024-01-24 NOTE — Telephone Encounter (Signed)
 Requested Prescriptions  Pending Prescriptions Disp Refills   gabapentin (NEURONTIN) 300 MG capsule [Pharmacy Med Name: GABAPENTIN 300MG  CAPSULES] 90 capsule 0    Sig: TAKE 1 CAPSULE(300 MG) BY MOUTH THREE TIMES DAILY     Neurology: Anticonvulsants - gabapentin Failed - 01/24/2024  4:53 PM      Failed - Valid encounter within last 12 months    Recent Outpatient Visits           1 month ago Episodic migraine   [name], [name], DO       Future Appointments             In 2 months Agbor-Etang, Polly Brink, MD [facility] - Cr in normal range and within 360 days    Creat  Date Value Ref Range Status  07/30/2018 0.89 0.50 - 1.05 mg/dL Final    Comment:    For patients >96 years of age, the reference limit for Creatinine is approximately 13% higher for people identified as African-American. .    Creatinine, Ser  Date Value Ref Range Status  09/22/2023 0.65 0.44 - 1.00 mg/dL Final         Passed - Completed PHQ-2 or PHQ-9 in the last 360 days

## 2024-01-25 ENCOUNTER — Encounter: Payer: Self-pay | Admitting: Family Medicine

## 2024-01-25 ENCOUNTER — Ambulatory Visit (INDEPENDENT_AMBULATORY_CARE_PROVIDER_SITE_OTHER): Admitting: Family Medicine

## 2024-01-25 VITALS — BP 130/76 | HR 74 | Ht 67.0 in | Wt 240.5 lb

## 2024-01-25 DIAGNOSIS — M4722 Other spondylosis with radiculopathy, cervical region: Secondary | ICD-10-CM | POA: Diagnosis not present

## 2024-01-25 DIAGNOSIS — R630 Anorexia: Secondary | ICD-10-CM | POA: Diagnosis not present

## 2024-01-25 DIAGNOSIS — G8929 Other chronic pain: Secondary | ICD-10-CM | POA: Diagnosis not present

## 2024-01-25 DIAGNOSIS — R42 Dizziness and giddiness: Secondary | ICD-10-CM | POA: Diagnosis not present

## 2024-01-25 DIAGNOSIS — M5442 Lumbago with sciatica, left side: Secondary | ICD-10-CM

## 2024-01-25 DIAGNOSIS — M5030 Other cervical disc degeneration, unspecified cervical region: Secondary | ICD-10-CM

## 2024-01-25 DIAGNOSIS — G43909 Migraine, unspecified, not intractable, without status migrainosus: Secondary | ICD-10-CM

## 2024-01-25 DIAGNOSIS — M51362 Other intervertebral disc degeneration, lumbar region with discogenic back pain and lower extremity pain: Secondary | ICD-10-CM

## 2024-01-25 DIAGNOSIS — R110 Nausea: Secondary | ICD-10-CM

## 2024-01-25 DIAGNOSIS — E1165 Type 2 diabetes mellitus with hyperglycemia: Secondary | ICD-10-CM

## 2024-01-25 DIAGNOSIS — Z794 Long term (current) use of insulin: Secondary | ICD-10-CM

## 2024-01-25 MED ORDER — NURTEC 75 MG PO TBDP: 75.0000 mg | ORAL_TABLET | Freq: Every day | ORAL | 2 refills | Status: DC | PRN

## 2024-01-25 NOTE — Patient Instructions (Addendum)

## 2024-01-25 NOTE — Progress Notes (Addendum)
 Subjective:    Patient ID: Christy Mayer, female    DOB: 1966/03/18, 58 y.o.   MRN: [id number]  Christy Mayer is a 58 y.o. female presenting on 01/25/2024 for Migraine   HPI  Discussed the use of AI scribe software for clinical note transcription with the patient, who gave verbal consent to proceed.  History of Present Illness    Episodic Migraine Vertigo / Nausea Chronic Neck and Back Pain / Degenerative Disc Disease  She has been experiencing persistent migraine headaches for the past 2+ months, accompanied by vertigo, dizziness, and photophobia. These symptoms are severe enough to prevent her from working, particularly affecting her ability to look at a computer screen. Lying down provides relief. She has been using ibuprofen, Tylenol, and Benadryl, which offer some relief but require her to sleep.  For migraines she has been trying various medications. Unsuccessful results on Sumatriptan, Rizatriptan. Now recently approved by insurance on Nurtec ODT and awaiting Neurology specialist consultation.  She has Zofran for nausea and migraines. Temporary relief.   In addition to the migraines, she has gastrointestinal symptoms including nausea, vomiting, diarrhea, and anorexia. She describes being limited in only able to eat a few bites per day. These symptoms began with COVID viral illness onset February 4th with a sore throat and dull headache, initially thought to be a viral illness. The symptoms persist in a cyclical pattern, with episodes of severe nausea and inability to keep food down occurring every two to four days.   She has remained out of work unable to work since 11/20/23   She is dealing with issues related to short-term disability paperwork, needing updated information to confirm ongoing medical issues for her claim. She mentions difficulties with transportation to medical appointments, specifically missing a cardiologist appointment due to a transportation issue, and is  awaiting a spinal doctor appointment.       Upcoming Physiatry (Spine specialty) 01/30/24 Upcoming Cardiology Upcoming Neurology 05/07/24 Dermatology upcoming, anticipating surgery for basal cell carcinoma      01/25/2024    9:33 AM 12/15/2023   11:08 AM 10/06/2023   11:00 AM  Depression screen PHQ 2/9  Decreased Interest 2 0 1  Down, Depressed, Hopeless 0 0 0  PHQ - 2 Score 2 0 1  Altered sleeping 0  3  Tired, decreased energy 2  3  Change in appetite 0  3  Feeling bad or failure about yourself  0  0  Trouble concentrating 0  3  Moving slowly or fidgety/restless 0  0  Suicidal thoughts 0  0  PHQ-9 Score 4  13  Difficult doing work/chores Very difficult         01/25/2024    9:34 AM 10/06/2023   11:00 AM 02/15/2022    1:21 PM 09/18/2018    1:17 PM  GAD 7 : Generalized Anxiety Score  Nervous, Anxious, on Edge 0 3 1 3   Control/stop worrying 0 0 0 1  Worry too much - different things 0 1 0 2  Trouble relaxing 0 0 0 3  Restless 0 0 0 2  Easily annoyed or irritable 0 2 0 1  Afraid - awful might happen 0 0 0 2  Total GAD 7 Score 0 6 1 14   Anxiety Difficulty Not difficult at all  Not difficult at all Somewhat difficult    Social History   Tobacco Use   Smoking status: Every Day    Current packs/day: 0.50    Average packs/day:  0.5 packs/day for 30.0 years (15.0 ttl pk-yrs)    Types: Cigarettes    Passive exposure: Never   Smokeless tobacco: Never  Vaping Use   Vaping status: Never Used  Substance Use Topics   Alcohol use: Yes    Alcohol/week: 0.0 standard drinks of alcohol    Comment: occasionally    Drug use: No    Review of Systems Per HPI unless specifically indicated above     Objective:    BP 130/76 (BP Location: Left Arm, Patient Position: Sitting, Cuff Size: Large)   Pulse 74   Ht 5\' 7"  (1.702 m)   Wt 240 lb 8 oz (109.1 kg)   LMP 08/11/2014   SpO2 96%   BMI 37.67 kg/m   Wt Readings from Last 3 Encounters:  01/25/24 240 lb 8 oz (109.1 kg)   12/15/23 238 lb 11.2 oz (108.3 kg)  10/06/23 241 lb (109.3 kg)    Physical Exam Vitals and nursing note reviewed.  Constitutional:      General: She is not in acute distress.    Appearance: Normal appearance. She is well-developed. She is not diaphoretic.     Comments: uncomfortable due to headache, cooperative   HENT:     Head: Normocephalic and atraumatic.  Eyes:     General:        Right eye: No discharge.        Left eye: No discharge.     Conjunctiva/sclera: Conjunctivae normal.  Neck:     Comments: Reduced range of motion neck. Muscle hypertonicity Cardiovascular:     Rate and Rhythm: Normal rate.  Pulmonary:     Effort: Pulmonary effort is normal.  Musculoskeletal:     Comments: Reduced range of motion with low back and muscle hypertonicity.  Skin:    General: Skin is warm and dry.     Findings: No erythema or rash.  Neurological:     Mental Status: She is alert and oriented to person, place, and time.  Psychiatric:        Mood and Affect: Mood normal.        Behavior: Behavior normal.        Thought Content: Thought content normal.     Comments: Well groomed, good eye contact, normal speech and thoughts     Results for orders placed or performed during the hospital encounter of 09/21/23  Comprehensive metabolic panel   Collection Time: 09/21/23 12:53 AM  Result Value Ref Range   Sodium 133 (L) 135 - 145 mmol/L   Potassium 2.9 (L) 3.5 - 5.1 mmol/L   Chloride 96 (L) 98 - 111 mmol/L   CO2 22 22 - 32 mmol/L   Glucose, Bld 318 (H) 70 - 99 mg/dL   BUN 15 6 - 20 mg/dL   Creatinine, Ser 8.41 0.44 - 1.00 mg/dL   Calcium 8.7 (L) 8.9 - 10.3 mg/dL   Total Protein 7.7 6.5 - 8.1 g/dL   Albumin 4.2 3.5 - 5.0 g/dL   AST 17 15 - 41 U/L   ALT 26 0 - 44 U/L   Alkaline Phosphatase 80 38 - 126 U/L   Total Bilirubin 0.6 <1.2 mg/dL   GFR, Estimated >32 >44 mL/min   Anion gap 15 5 - 15  Ethanol   Collection Time: 09/21/23 12:53 AM  Result Value Ref Range   Alcohol, Ethyl  (B) 152 (H) <10 mg/dL  Salicylate level   Collection Time: 09/21/23 12:53 AM  Result Value Ref Range  Salicylate Lvl <7.0 (L) 7.0 - 30.0 mg/dL  Acetaminophen level   Collection Time: 09/21/23 12:53 AM  Result Value Ref Range   Acetaminophen (Tylenol), Serum <10 (L) 10 - 30 ug/mL  cbc   Collection Time: 09/21/23 12:53 AM  Result Value Ref Range   WBC 6.8 4.0 - 10.5 K/uL   RBC 4.55 3.87 - 5.11 MIL/uL   Hemoglobin 15.1 (H) 12.0 - 15.0 g/dL   HCT 16.1 09.6 - 04.5 %   MCV 96.0 80.0 - 100.0 fL   MCH 33.2 26.0 - 34.0 pg   MCHC 34.6 30.0 - 36.0 g/dL   RDW 40.9 (L) 81.1 - 91.4 %   Platelets 189 150 - 400 K/uL   nRBC 0.0 0.0 - 0.2 %  Acetaminophen level   Collection Time: 09/21/23  6:10 AM  Result Value Ref Range   Acetaminophen (Tylenol), Serum <10 (L) 10 - 30 ug/mL  Salicylate level   Collection Time: 09/21/23  6:10 AM  Result Value Ref Range   Salicylate Lvl <7.0 (L) 7.0 - 30.0 mg/dL  HIV Antibody (routine testing w rflx)   Collection Time: 09/21/23  6:10 AM  Result Value Ref Range   HIV Screen 4th Generation wRfx Non Reactive Non Reactive  Hemoglobin A1c   Collection Time: 09/21/23  6:10 AM  Result Value Ref Range   Hgb A1c MFr Bld 9.0 (H) 4.8 - 5.6 %   Mean Plasma Glucose 211.6 mg/dL  CBG monitoring, ED   Collection Time: 09/21/23  8:09 AM  Result Value Ref Range   Glucose-Capillary 269 (H) 70 - 99 mg/dL  Basic metabolic panel   Collection Time: 09/21/23 10:26 AM  Result Value Ref Range   Sodium 133 (L) 135 - 145 mmol/L   Potassium 5.4 (H) 3.5 - 5.1 mmol/L   Chloride 102 98 - 111 mmol/L   CO2 24 22 - 32 mmol/L   Glucose, Bld 278 (H) 70 - 99 mg/dL   BUN 15 6 - 20 mg/dL   Creatinine, Ser 7.82 0.44 - 1.00 mg/dL   Calcium 8.2 (L) 8.9 - 10.3 mg/dL   GFR, Estimated >95 >62 mL/min   Anion gap 7 5 - 15  Magnesium   Collection Time: 09/21/23 10:26 AM  Result Value Ref Range   Magnesium 1.8 1.7 - 2.4 mg/dL  CBG monitoring, ED   Collection Time: 09/21/23 12:24 PM   Result Value Ref Range   Glucose-Capillary 188 (H) 70 - 99 mg/dL  CBG monitoring, ED   Collection Time: 09/21/23  4:28 PM  Result Value Ref Range   Glucose-Capillary 177 (H) 70 - 99 mg/dL  Potassium   Collection Time: 09/21/23  7:04 PM  Result Value Ref Range   Potassium 3.8 3.5 - 5.1 mmol/L  CBG monitoring, ED   Collection Time: 09/21/23  9:35 PM  Result Value Ref Range   Glucose-Capillary 262 (H) 70 - 99 mg/dL  CBC   Collection Time: 09/22/23  5:20 AM  Result Value Ref Range   WBC 8.6 4.0 - 10.5 K/uL   RBC 3.82 (L) 3.87 - 5.11 MIL/uL   Hemoglobin 12.7 12.0 - 15.0 g/dL   HCT 13.0 86.5 - 78.4 %   MCV 99.5 80.0 - 100.0 fL   MCH 33.2 26.0 - 34.0 pg   MCHC 33.4 30.0 - 36.0 g/dL   RDW 69.6 29.5 - 28.4 %   Platelets 189 150 - 400 K/uL   nRBC 0.0 0.0 - 0.2 %  Basic metabolic panel  Collection Time: 09/22/23  5:20 AM  Result Value Ref Range   Sodium 134 (L) 135 - 145 mmol/L   Potassium 3.9 3.5 - 5.1 mmol/L   Chloride 104 98 - 111 mmol/L   CO2 25 22 - 32 mmol/L   Glucose, Bld 197 (H) 70 - 99 mg/dL   BUN 12 6 - 20 mg/dL   Creatinine, Ser 1.61 0.44 - 1.00 mg/dL   Calcium 7.9 (L) 8.9 - 10.3 mg/dL   GFR, Estimated >09 >60 mL/min   Anion gap 5 5 - 15  Magnesium   Collection Time: 09/22/23  5:20 AM  Result Value Ref Range   Magnesium 2.2 1.7 - 2.4 mg/dL  CBG monitoring, ED   Collection Time: 09/22/23  8:33 AM  Result Value Ref Range   Glucose-Capillary 212 (H) 70 - 99 mg/dL  Urinalysis, Routine w reflex microscopic -Urine, Clean Catch   Collection Time: 09/22/23  8:59 AM  Result Value Ref Range   Color, Urine YELLOW (A) YELLOW   APPearance HAZY (A) CLEAR   Specific Gravity, Urine 1.010 1.005 - 1.030   pH 5.0 5.0 - 8.0   Glucose, UA NEGATIVE NEGATIVE mg/dL   Hgb urine dipstick NEGATIVE NEGATIVE   Bilirubin Urine NEGATIVE NEGATIVE   Ketones, ur NEGATIVE NEGATIVE mg/dL   Protein, ur NEGATIVE NEGATIVE mg/dL   Nitrite NEGATIVE NEGATIVE   Leukocytes,Ua NEGATIVE NEGATIVE    RBC / HPF 0-5 0 - 5 RBC/hpf   WBC, UA 0-5 0 - 5 WBC/hpf   Bacteria, UA RARE (A) NONE SEEN   Squamous Epithelial / HPF 6-10 0 - 5 /HPF  Urine Drug Screen, Qualitative   Collection Time: 09/22/23  8:59 AM  Result Value Ref Range   Tricyclic, Ur Screen NONE DETECTED NONE DETECTED   Amphetamines, Ur Screen NONE DETECTED NONE DETECTED   MDMA (Ecstasy)Ur Screen NONE DETECTED NONE DETECTED   Cocaine Metabolite,Ur [place] NONE DETECTED NONE DETECTED   Opiate, Ur Screen NONE DETECTED NONE DETECTED   Phencyclidine (PCP) Ur S NONE DETECTED NONE DETECTED   Cannabinoid 50 Ng, Ur [place] NONE DETECTED NONE DETECTED   Barbiturates, Ur Screen NONE DETECTED NONE DETECTED   Benzodiazepine, Ur Scrn NONE DETECTED NONE DETECTED   Methadone Scn, Ur NONE DETECTED NONE DETECTED  ECHOCARDIOGRAM COMPLETE   Collection Time: 09/22/23  9:57 AM  Result Value Ref Range   Weight 4,056.46 oz   Height 67 in   BP 117/64 mmHg   Ao pk vel 1.79 m/s   AV Area VTI 2.93 cm2   AR max vel 2.56 cm2   AV Mean grad 6.0 mmHg   AV Peak grad 12.8 mmHg   Single Plane A2C EF 59.1 %   Single Plane A4C EF 66.0 %   Calc EF 62.3 %   S' Lateral 2.90 cm   AV Area mean vel 2.52 cm2   Area-P 1/2 3.10 cm2   MV VTI 3.01 cm2   Est EF 65 - 70%   CBG monitoring, ED   Collection Time: 09/22/23 11:39 AM  Result Value Ref Range   Glucose-Capillary 122 (H) 70 - 99 mg/dL  CBG monitoring, ED   Collection Time: 09/22/23  4:44 PM  Result Value Ref Range   Glucose-Capillary 150 (H) 70 - 99 mg/dL  CBG monitoring, ED   Collection Time: 09/22/23 11:20 PM  Result Value Ref Range   Glucose-Capillary 191 (H) 70 - 99 mg/dL  CBG monitoring, ED   Collection Time: 09/23/23  7:57 AM  Result Value Ref Range   Glucose-Capillary 187 (H) 70 - 99 mg/dL   Comment 1 Notify RN    Comment 2 Document in Chart   CBG monitoring, ED   Collection Time: 09/23/23 11:37 AM  Result Value Ref Range   Glucose-Capillary 202 (H) 70 - 99 mg/dL  CBG monitoring, ED    Collection Time: 09/23/23  4:43 PM  Result Value Ref Range   Glucose-Capillary 120 (H) 70 - 99 mg/dL  CBG monitoring, ED   Collection Time: 09/23/23  9:08 PM  Result Value Ref Range   Glucose-Capillary 163 (H) 70 - 99 mg/dL  CBG monitoring, ED   Collection Time: 09/24/23  7:42 AM  Result Value Ref Range   Glucose-Capillary 178 (H) 70 - 99 mg/dL      Assessment & Plan:   Problem List Items Addressed This Visit     Chronic left-sided low back pain with left-sided sciatica   Episodic migraine - Primary   Relevant Medications   NURTEC 75 MG TBDP   Osteoarthritis of spine with radiculopathy, cervical region   Uncontrolled type 2 diabetes mellitus with hyperglycemia, with long-term current use of insulin (HCC)   Other Visit Diagnoses       Vertigo         Long term current use of insulin (HCC)         DDD (degenerative disc disease), cervical         Decreased appetite         Nausea         Degeneration of intervertebral disc of lumbar region with discogenic back pain and lower extremity pain            Episodic Migraine Onset worse since COVID case February 2025. Associates symptoms nausea dizziness and tinnitus. Previous treatments ineffective  >4 but < 14 headache days per month, Episodic and severe migraines, not adequately controlled with current regimen of sumatriptan, ibuprofen, Tylenol, and Benadryl. Discussed the need for more effective migraine management to break the cycle of symptoms.  Failed Sumatriptan, Rizatriptan Improved on Nurtec ODT 75mg  as needed  Now has Nurtec ODT approved for 1 year New order today for Nurtec ODT 75mg  #8 pills take one daily AS NEEDED, abortive therapy. Send rx to local [facility] pharmacy for expedited pick up  Upcoming apt with Neurology [facility] on 05/07/24 w Dr Lucia Gaskins for further migraine management.  Additionally chronic neck and back pain seem to be associated to frequency of headaches. Chronic Cervical and Lumbar Degenerative  Disc Disease Has upcoming Physiatry appointment on 01/30/24 for further pain evaluation   Nausea and Vomiting Recurrent episodes of nausea and vomiting, contributing to decreased food intake and overall malaise. Likely related to migraines and possibly exacerbated by inconsistent food intake. On Zofran ODT for nausea, to be taken as needed to improve appetite and reduce nausea. Treat underlying migraine to improve nausea  General Health Maintenance -Encourage patient to maintain adequate hydration and attempt regular, small meals when possible. -Follow up with patient regarding the effectiveness of the new migraine medication Nurtec.  Short Term Disability updates  Shanon Payor for the Short Term Disability Claim [id number] Received request recently for copies of progress notes from 12/23/23 through present. Today is first visit with me since that date. All other notes have previously been submitted already.  Will fax this office visit / progress note to them for review.  She remains unable to work due to Episodic Migraine Headaches, Nausea, Vertigo, and Chronic  Neck and Back Pain   No orders of the defined types were placed in this encounter.   Meds ordered this encounter  Medications   NURTEC 75 MG TBDP    Sig: Take 1 tablet (75 mg total) by mouth daily as needed (migraine headache). Max 1 tablet in 24 hours.    Dispense:  8 tablet    Refill:  2    Needs new order. Already approved for 1 year through 01/2025 through [employer].    Follow up plan: Return if symptoms worsen or fail to improve.   Domingo Friend, DO [facility] 01/25/2024, 9:47 AM

## 2024-01-30 ENCOUNTER — Encounter: Payer: Self-pay | Admitting: Family Medicine

## 2024-01-30 DIAGNOSIS — M5412 Radiculopathy, cervical region: Secondary | ICD-10-CM | POA: Diagnosis not present

## 2024-01-30 DIAGNOSIS — M542 Cervicalgia: Secondary | ICD-10-CM | POA: Diagnosis not present

## 2024-01-30 DIAGNOSIS — M5441 Lumbago with sciatica, right side: Secondary | ICD-10-CM | POA: Diagnosis not present

## 2024-01-30 DIAGNOSIS — M5442 Lumbago with sciatica, left side: Secondary | ICD-10-CM | POA: Diagnosis not present

## 2024-01-30 DIAGNOSIS — M5416 Radiculopathy, lumbar region: Secondary | ICD-10-CM | POA: Diagnosis not present

## 2024-01-30 DIAGNOSIS — G8929 Other chronic pain: Secondary | ICD-10-CM | POA: Diagnosis not present

## 2024-01-31 ENCOUNTER — Other Ambulatory Visit: Payer: Self-pay | Admitting: Physical Medicine & Rehabilitation

## 2024-01-31 DIAGNOSIS — M5442 Lumbago with sciatica, left side: Secondary | ICD-10-CM

## 2024-01-31 DIAGNOSIS — M5441 Lumbago with sciatica, right side: Secondary | ICD-10-CM

## 2024-01-31 DIAGNOSIS — M542 Cervicalgia: Secondary | ICD-10-CM

## 2024-02-01 ENCOUNTER — Telehealth: Admitting: Family Medicine

## 2024-02-01 ENCOUNTER — Encounter: Payer: Self-pay | Admitting: Family Medicine

## 2024-02-01 DIAGNOSIS — H00011 Hordeolum externum right upper eyelid: Secondary | ICD-10-CM | POA: Diagnosis not present

## 2024-02-01 MED ORDER — POLYMYXIN B-TRIMETHOPRIM 10000-0.1 UNIT/ML-% OP SOLN: 1.0000 [drp] | Freq: Four times a day (QID) | OPHTHALMIC | 0 refills | Status: DC

## 2024-02-01 NOTE — Progress Notes (Signed)
 Subjective:    Patient ID: Christy Mayer, female    DOB: Sep 21, 1966, 58 y.o.   MRN: [id number]  Christy Mayer is a 58 y.o. female presenting on 02/01/2024 for Stye   Virtual / Telehealth Encounter - Video Visit via MyChart The purpose of this virtual visit is to provide medical care while limiting exposure to the novel coronavirus (COVID19) for both patient and office staff.  Consent was obtained for remote visit:  Yes.   Answered questions that patient had about telehealth interaction:  Yes.   I discussed the limitations, risks, security and privacy concerns of performing an evaluation and management service by video/telephone. I also discussed with the patient that there may be a patient responsible charge related to this service. The patient expressed understanding and agreed to proceed.  Patient Location: Home Provider Location: [facility] (Office)  Participants in virtual visit: - Patient: Christy Mayer - CMA: Arabella Knife CMA - Provider: Dr Romeo Co   HPI  Discussed the use of AI scribe software for clinical note transcription with the patient, who gave verbal consent to proceed.  History of Present Illness   Christy Mayer is a 58 year old female who presents with a persistent lump on her right upper eyelid.  She noticed her right eye began itching last Thursday. She initially used Visine lubricating and allergy relief drops, suspecting pollen as the cause due to high pollen levels in her area. Despite this, by Friday through Sunday, she experienced throbbing pain in the eye.  The pain and itching have since resolved, but she is left with a small, hard lump on the upper eyelid, away from the tear duct. She feels the lump when moving her eye. There is no drainage or unusual discharge from the eye.  She applied cold compresses initially, which helped with the pain and itching, but has not used any compresses since the symptoms subsided, leaving only  the lump.  The lump is not affecting her vision or eyelid movement, and there is no redness or swelling currently present. She prefers using eye drops over ointment for treatment, noting that she can apply drops more frequently.  No current pain, itching, or discharge from the eye. The left eye is not affected.           01/25/2024    9:33 AM 12/15/2023   11:08 AM 10/06/2023   11:00 AM  Depression screen PHQ 2/9  Decreased Interest 2 0 1  Down, Depressed, Hopeless 0 0 0  PHQ - 2 Score 2 0 1  Altered sleeping 0  3  Tired, decreased energy 2  3  Change in appetite 0  3  Feeling bad or failure about yourself  0  0  Trouble concentrating 0  3  Moving slowly or fidgety/restless 0  0  Suicidal thoughts 0  0  PHQ-9 Score 4  13  Difficult doing work/chores Very difficult         01/25/2024    9:34 AM 10/06/2023   11:00 AM 02/15/2022    1:21 PM 09/18/2018    1:17 PM  GAD 7 : Generalized Anxiety Score  Nervous, Anxious, on Edge 0 3 1 3   Control/stop worrying 0 0 0 1  Worry too much - different things 0 1 0 2  Trouble relaxing 0 0 0 3  Restless 0 0 0 2  Easily annoyed or irritable 0 2 0 1  Afraid - awful might happen 0 0  0 2  Total GAD 7 Score 0 6 1 14   Anxiety Difficulty Not difficult at all  Not difficult at all Somewhat difficult    Social History   Tobacco Use   Smoking status: Every Day    Current packs/day: 0.50    Average packs/day: 0.5 packs/day for 30.0 years (15.0 ttl pk-yrs)    Types: Cigarettes    Passive exposure: Never   Smokeless tobacco: Never  Vaping Use   Vaping status: Never Used  Substance Use Topics   Alcohol  use: Yes    Alcohol /week: 0.0 standard drinks of alcohol     Comment: occasionally    Drug use: No    Review of Systems Per HPI unless specifically indicated above     Objective:    LMP 08/11/2014   Wt Readings from Last 3 Encounters:  01/25/24 240 lb 8 oz (109.1 kg)  12/15/23 238 lb 11.2 oz (108.3 kg)  10/06/23 241 lb (109.3 kg)      Physical Exam  Note examination was completely remotely via video observation objective data only  Gen - well-appearing, no acute distress or apparent pain, comfortable HEENT - eyes appear clear without discharge or redness. Right upper inner eyelid with small nodular raised density consistent with stye, no extending erythema. Heart/Lungs - cannot examine virtually - observed no evidence of coughing or labored breathing. Abd - cannot examine virtually  Skin - face visible today Neuro - awake, alert, oriented Psych - not anxious appearing   Results for orders placed or performed during the hospital encounter of 09/21/23  Comprehensive metabolic panel   Collection Time: 09/21/23 12:53 AM  Result Value Ref Range   Sodium 133 (L) 135 - 145 mmol/L   Potassium 2.9 (L) 3.5 - 5.1 mmol/L   Chloride 96 (L) 98 - 111 mmol/L   CO2 22 22 - 32 mmol/L   Glucose, Bld 318 (H) 70 - 99 mg/dL   BUN 15 6 - 20 mg/dL   Creatinine, Ser 1.19 0.44 - 1.00 mg/dL   Calcium 8.7 (L) 8.9 - 10.3 mg/dL   Total Protein 7.7 6.5 - 8.1 g/dL   Albumin 4.2 3.5 - 5.0 g/dL   AST 17 15 - 41 U/L   ALT 26 0 - 44 U/L   Alkaline Phosphatase 80 38 - 126 U/L   Total Bilirubin 0.6 <1.2 mg/dL   GFR, Estimated >14 >78 mL/min   Anion gap 15 5 - 15  Ethanol   Collection Time: 09/21/23 12:53 AM  Result Value Ref Range   Alcohol , Ethyl (B) 152 (H) <10 mg/dL  Salicylate level   Collection Time: 09/21/23 12:53 AM  Result Value Ref Range   Salicylate Lvl <7.0 (L) 7.0 - 30.0 mg/dL  Acetaminophen  level   Collection Time: 09/21/23 12:53 AM  Result Value Ref Range   Acetaminophen  (Tylenol ), Serum <10 (L) 10 - 30 ug/mL  cbc   Collection Time: 09/21/23 12:53 AM  Result Value Ref Range   WBC 6.8 4.0 - 10.5 K/uL   RBC 4.55 3.87 - 5.11 MIL/uL   Hemoglobin 15.1 (H) 12.0 - 15.0 g/dL   HCT 29.5 62.1 - 30.8 %   MCV 96.0 80.0 - 100.0 fL   MCH 33.2 26.0 - 34.0 pg   MCHC 34.6 30.0 - 36.0 g/dL   RDW 65.7 (L) 84.6 - 96.2 %    Platelets 189 150 - 400 K/uL   nRBC 0.0 0.0 - 0.2 %  Acetaminophen  level   Collection Time: 09/21/23  6:10  AM  Result Value Ref Range   Acetaminophen  (Tylenol ), Serum <10 (L) 10 - 30 ug/mL  Salicylate level   Collection Time: 09/21/23  6:10 AM  Result Value Ref Range   Salicylate Lvl <7.0 (L) 7.0 - 30.0 mg/dL  HIV Antibody (routine testing w rflx)   Collection Time: 09/21/23  6:10 AM  Result Value Ref Range   HIV Screen 4th Generation wRfx Non Reactive Non Reactive  Hemoglobin A1c   Collection Time: 09/21/23  6:10 AM  Result Value Ref Range   Hgb A1c MFr Bld 9.0 (H) 4.8 - 5.6 %   Mean Plasma Glucose 211.6 mg/dL  CBG monitoring, ED   Collection Time: 09/21/23  8:09 AM  Result Value Ref Range   Glucose-Capillary 269 (H) 70 - 99 mg/dL  Basic metabolic panel   Collection Time: 09/21/23 10:26 AM  Result Value Ref Range   Sodium 133 (L) 135 - 145 mmol/L   Potassium 5.4 (H) 3.5 - 5.1 mmol/L   Chloride 102 98 - 111 mmol/L   CO2 24 22 - 32 mmol/L   Glucose, Bld 278 (H) 70 - 99 mg/dL   BUN 15 6 - 20 mg/dL   Creatinine, Ser 7.82 0.44 - 1.00 mg/dL   Calcium 8.2 (L) 8.9 - 10.3 mg/dL   GFR, Estimated >95 >62 mL/min   Anion gap 7 5 - 15  Magnesium    Collection Time: 09/21/23 10:26 AM  Result Value Ref Range   Magnesium  1.8 1.7 - 2.4 mg/dL  CBG monitoring, ED   Collection Time: 09/21/23 12:24 PM  Result Value Ref Range   Glucose-Capillary 188 (H) 70 - 99 mg/dL  CBG monitoring, ED   Collection Time: 09/21/23  4:28 PM  Result Value Ref Range   Glucose-Capillary 177 (H) 70 - 99 mg/dL  Potassium   Collection Time: 09/21/23  7:04 PM  Result Value Ref Range   Potassium 3.8 3.5 - 5.1 mmol/L  CBG monitoring, ED   Collection Time: 09/21/23  9:35 PM  Result Value Ref Range   Glucose-Capillary 262 (H) 70 - 99 mg/dL  CBC   Collection Time: 09/22/23  5:20 AM  Result Value Ref Range   WBC 8.6 4.0 - 10.5 K/uL   RBC 3.82 (L) 3.87 - 5.11 MIL/uL   Hemoglobin 12.7 12.0 - 15.0 g/dL   HCT  13.0 86.5 - 78.4 %   MCV 99.5 80.0 - 100.0 fL   MCH 33.2 26.0 - 34.0 pg   MCHC 33.4 30.0 - 36.0 g/dL   RDW 69.6 29.5 - 28.4 %   Platelets 189 150 - 400 K/uL   nRBC 0.0 0.0 - 0.2 %  Basic metabolic panel   Collection Time: 09/22/23  5:20 AM  Result Value Ref Range   Sodium 134 (L) 135 - 145 mmol/L   Potassium 3.9 3.5 - 5.1 mmol/L   Chloride 104 98 - 111 mmol/L   CO2 25 22 - 32 mmol/L   Glucose, Bld 197 (H) 70 - 99 mg/dL   BUN 12 6 - 20 mg/dL   Creatinine, Ser 1.32 0.44 - 1.00 mg/dL   Calcium 7.9 (L) 8.9 - 10.3 mg/dL   GFR, Estimated >44 >01 mL/min   Anion gap 5 5 - 15  Magnesium    Collection Time: 09/22/23  5:20 AM  Result Value Ref Range   Magnesium  2.2 1.7 - 2.4 mg/dL  CBG monitoring, ED   Collection Time: 09/22/23  8:33 AM  Result Value Ref Range   Glucose-Capillary 212 (  H) 70 - 99 mg/dL  Urinalysis, Routine w reflex microscopic -Urine, Clean Catch   Collection Time: 09/22/23  8:59 AM  Result Value Ref Range   Color, Urine YELLOW (A) YELLOW   APPearance HAZY (A) CLEAR   Specific Gravity, Urine 1.010 1.005 - 1.030   pH 5.0 5.0 - 8.0   Glucose, UA NEGATIVE NEGATIVE mg/dL   Hgb urine dipstick NEGATIVE NEGATIVE   Bilirubin Urine NEGATIVE NEGATIVE   Ketones, ur NEGATIVE NEGATIVE mg/dL   Protein, ur NEGATIVE NEGATIVE mg/dL   Nitrite NEGATIVE NEGATIVE   Leukocytes,Ua NEGATIVE NEGATIVE   RBC / HPF 0-5 0 - 5 RBC/hpf   WBC, UA 0-5 0 - 5 WBC/hpf   Bacteria, UA RARE (A) NONE SEEN   Squamous Epithelial / HPF 6-10 0 - 5 /HPF  Urine Drug Screen, Qualitative   Collection Time: 09/22/23  8:59 AM  Result Value Ref Range   Tricyclic, Ur Screen NONE DETECTED NONE DETECTED   Amphetamines, Ur Screen NONE DETECTED NONE DETECTED   MDMA (Ecstasy)Ur Screen NONE DETECTED NONE DETECTED   Cocaine Metabolite,Ur [place] NONE DETECTED NONE DETECTED   Opiate, Ur Screen NONE DETECTED NONE DETECTED   Phencyclidine (PCP) Ur S NONE DETECTED NONE DETECTED   Cannabinoid 50 Ng, Ur [place] NONE DETECTED NONE  DETECTED   Barbiturates, Ur Screen NONE DETECTED NONE DETECTED   Benzodiazepine, Ur Scrn NONE DETECTED NONE DETECTED   Methadone Scn, Ur NONE DETECTED NONE DETECTED  ECHOCARDIOGRAM COMPLETE   Collection Time: 09/22/23  9:57 AM  Result Value Ref Range   Weight 4,056.46 oz   Height 67 in   BP 117/64 mmHg   Ao pk vel 1.79 m/s   AV Area VTI 2.93 cm2   AR max vel 2.56 cm2   AV Mean grad 6.0 mmHg   AV Peak grad 12.8 mmHg   Single Plane A2C EF 59.1 %   Single Plane A4C EF 66.0 %   Calc EF 62.3 %   S' Lateral 2.90 cm   AV Area mean vel 2.52 cm2   Area-P 1/2 3.10 cm2   MV VTI 3.01 cm2   Est EF 65 - 70%   CBG monitoring, ED   Collection Time: 09/22/23 11:39 AM  Result Value Ref Range   Glucose-Capillary 122 (H) 70 - 99 mg/dL  CBG monitoring, ED   Collection Time: 09/22/23  4:44 PM  Result Value Ref Range   Glucose-Capillary 150 (H) 70 - 99 mg/dL  CBG monitoring, ED   Collection Time: 09/22/23 11:20 PM  Result Value Ref Range   Glucose-Capillary 191 (H) 70 - 99 mg/dL  CBG monitoring, ED   Collection Time: 09/23/23  7:57 AM  Result Value Ref Range   Glucose-Capillary 187 (H) 70 - 99 mg/dL   Comment 1 Notify RN    Comment 2 Document in Chart   CBG monitoring, ED   Collection Time: 09/23/23 11:37 AM  Result Value Ref Range   Glucose-Capillary 202 (H) 70 - 99 mg/dL  CBG monitoring, ED   Collection Time: 09/23/23  4:43 PM  Result Value Ref Range   Glucose-Capillary 120 (H) 70 - 99 mg/dL  CBG monitoring, ED   Collection Time: 09/23/23  9:08 PM  Result Value Ref Range   Glucose-Capillary 163 (H) 70 - 99 mg/dL  CBG monitoring, ED   Collection Time: 09/24/23  7:42 AM  Result Value Ref Range   Glucose-Capillary 178 (H) 70 - 99 mg/dL      Assessment & Plan:  Problem List Items Addressed This Visit   None Visit Diagnoses       Hordeolum externum of right upper eyelid    -  Primary   Relevant Medications   trimethoprim -polymyxin b  (POLYTRIM ) ophthalmic solution        Stye of right eye, external upper Persistent small lump on upper right eyelid, suspect bacterial stye. No discharge or vision obstruction. Some improvement initially in first week. No evidence of conjunctivitis seen on video or history  - Apply warm compresses 10-15 minutes twice daily. - Prescribed antibiotic eye drops for right eye, four times daily for 7-10 days.  - Consider ophthalmologist referral if lump persists despite treatment and forms chalazion   No orders of the defined types were placed in this encounter.   Meds ordered this encounter  Medications   trimethoprim -polymyxin b  (POLYTRIM ) ophthalmic solution    Sig: Place 1 drop into the right eye 4 (four) times daily. For up to 7-10 days.    Dispense:  10 mL    Refill:  0    Follow up plan: Return if symptoms worsen or fail to improve.   Patient verbalizes understanding with the above medical recommendations including the limitation of remote medical advice.  Specific follow-up and call-back criteria were given for patient to follow-up or seek medical care more urgently if needed.  Total duration of direct patient care provided via video conference: 10 minutes   Domingo Friend, DO [facility] 02/01/2024, 10:12 AM

## 2024-02-01 NOTE — Patient Instructions (Addendum)
 Thank you for coming to the office today.  1. It looks like a small Stye or "Hordeoleum" of your eyelid, it may come back again if it does not fully resolve. If it develops a more hard or firm cyst, we consider it a "Chalazion" and it is less likely to resolve on it's own - Initial treatment is warm compresses up to 4 to 6 times a day using warm washcloth place over eyelid and apply gentle pressure, hold this on for 10-15 min at a time, can re-warm if cools down  Antibiotic eye drop 4 times per day 7-10 days  It may drain pus and reduce in size, this is ideal, otherwise if significant worsening with spreading of redness or swelling onto eyelid or around face, unable to see, fevers/chills, then please return to clinic sooner or call, may go to Emergency Dept if significant worsening  Otherwise, if improving but not going away after 1-2 weeks, call us  back and we will refer you to Ophthalmology to have it removed.    Please schedule a Follow-up Appointment to: Return if symptoms worsen or fail to improve.  If you have any other questions or concerns, please feel free to call the office or send a message through MyChart. You may also schedule an earlier appointment if necessary.  Additionally, you may be receiving a survey about your experience at our office within a few days to 1 week by e-mail or mail. We value your feedback.  Domingo Friend, DO [facility], [place]

## 2024-02-09 ENCOUNTER — Ambulatory Visit
Admission: RE | Admit: 2024-02-09 | Discharge: 2024-02-09 | Disposition: A | Discharge status: Home / Self Care | Source: Ambulatory Visit | Attending: Physical Medicine & Rehabilitation | Admitting: Physical Medicine & Rehabilitation

## 2024-02-09 DIAGNOSIS — M5442 Lumbago with sciatica, left side: Secondary | ICD-10-CM

## 2024-02-09 DIAGNOSIS — M542 Cervicalgia: Secondary | ICD-10-CM

## 2024-02-09 DIAGNOSIS — M5441 Lumbago with sciatica, right side: Secondary | ICD-10-CM

## 2024-02-09 DIAGNOSIS — M47812 Spondylosis without myelopathy or radiculopathy, cervical region: Secondary | ICD-10-CM | POA: Diagnosis not present

## 2024-02-12 ENCOUNTER — Other Ambulatory Visit: Payer: Self-pay | Admitting: Family Medicine

## 2024-02-12 DIAGNOSIS — F411 Generalized anxiety disorder: Secondary | ICD-10-CM

## 2024-02-12 DIAGNOSIS — R110 Nausea: Secondary | ICD-10-CM

## 2024-02-12 DIAGNOSIS — E1165 Type 2 diabetes mellitus with hyperglycemia: Secondary | ICD-10-CM

## 2024-02-13 ENCOUNTER — Encounter: Payer: Self-pay | Admitting: Family Medicine

## 2024-02-14 ENCOUNTER — Encounter: Payer: Self-pay | Admitting: Family Medicine

## 2024-02-14 DIAGNOSIS — F339 Major depressive disorder, recurrent, unspecified: Secondary | ICD-10-CM

## 2024-02-14 DIAGNOSIS — E1165 Type 2 diabetes mellitus with hyperglycemia: Secondary | ICD-10-CM

## 2024-02-14 MED ORDER — INSULIN DETEMIR 100 UNIT/ML FLEXPEN: 20.0000 [IU] | PEN_INJECTOR | Freq: Every day | SUBCUTANEOUS | 2 refills | Status: DC

## 2024-02-14 MED ORDER — BUPROPION HCL ER (XL) 150 MG PO TB24: 150.0000 mg | ORAL_TABLET | Freq: Every day | ORAL | 1 refills | Status: DC

## 2024-02-14 NOTE — Telephone Encounter (Signed)
 Requested Prescriptions  Pending Prescriptions Disp Refills   ondansetron  (ZOFRAN -ODT) 4 MG disintegrating tablet [Pharmacy Med Name: ONDANSETRON  ODT 4MG  TABLETS] 30 tablet     Sig: DISSOLVE 1 TABLET(4 MG) ON THE TONGUE EVERY 8 HOURS AS NEEDED FOR NAUSEA OR VOMITING     Not Delegated - Gastroenterology: Antiemetics - ondansetron  Failed - 02/14/2024 10:39 AM      Failed - This refill cannot be delegated      Failed - Valid encounter within last 6 months    Recent Outpatient Visits           1 week ago Hordeolum externum of right upper eyelid   [facility] [place], [name], DO   2 weeks ago Episodic migraine   [place] [facility] [place], [name], DO   2 months ago Episodic migraine   [place] [facility] Raina Bunting, DO       Future Appointments             In 2 months Agbor-Etang, Polly Brink, MD [facility] at [facility] - AST in normal range and within 360 days    AST  Date Value Ref Range Status  09/21/2023 17 15 - 41 U/L Final   SGOT(AST)  Date Value Ref Range Status  01/07/2015 24 U/L Final    Comment:    15-41 NOTE: New Reference Range  12/16/14          Passed - ALT in normal range and within 360 days    ALT  Date Value Ref Range Status  09/21/2023 26 0 - 44 U/L Final   SGPT (ALT)  Date Value Ref Range Status  01/07/2015 21 U/L Final    Comment:    14-54 NOTE: New Reference Range  12/16/14           TRULICITY  1.5 MG/0.5ML SOAJ [Pharmacy Med Name: TRULICITY  1.5MG /0.5ML INJ (4 PENS)] 2 mL     Sig: ADMINISTER 1.5 MG UNDER THE SKIN 1 TIME A WEEK     Endocrinology:  Diabetes - GLP-1 Receptor Agonists Failed - 02/14/2024 10:39 AM      Failed - HBA1C is between 0 and 7.9 and within 180 days    Hgb A1c MFr Bld  Date Value Ref Range Status  09/21/2023 9.0 (H) 4.8 - 5.6 % Final    Comment:    (NOTE) Pre diabetes:           5.7%-6.4%  Diabetes:              >6.4%  Glycemic control for   <7.0% adults with diabetes          Failed - Valid encounter within last 6 months    Recent Outpatient Visits           1 week ago Hordeolum externum of right upper eyelid   [place] [facility] Raina Bunting, DO   2 weeks ago Episodic migraine   [place] [facility] Raina Bunting, DO   2 months ago Episodic migraine    [facility] Raina Bunting, DO       Future Appointments             In 2 months Agbor-Etang, Polly Brink, MD [facility] at [facility] Gastroenterology  hydrOXYzine  (ATARAX ) 25 MG tablet [Pharmacy Med Name: HYDROXYZINE  HCL 25MG  TABS] 270 tablet 0    Sig: TAKE 1 TABLET(25 MG) BY MOUTH THREE TIMES DAILY AS NEEDED FOR ANXIETY     Ear, Nose, and Throat:  Antihistamines 2 Failed - 02/14/2024 10:39 AM      Failed - Valid encounter within last 12 months    Recent Outpatient Visits           1 week ago Hordeolum externum of right upper eyelid   [place] [facility] [place], [name], DO   2 weeks ago Episodic migraine   [place] [facility] Raina Bunting, DO   2 months ago Episodic migraine   [place] [facility] Raina Bunting, DO       Future Appointments             In 2 months Agbor-Etang, Polly Brink, MD [facility] at [facility] - Cr in normal range and within 360 days    Creat  Date Value Ref Range Status  07/30/2018 0.89 0.50 - 1.05 mg/dL Final    Comment:    For patients >26 years of age, the reference limit for Creatinine is approximately 13% higher for people identified as African-American. .    Creatinine, Ser  Date Value Ref Range Status  09/22/2023 0.65 0.44 - 1.00 mg/dL Final          ALPRAZolam  (XANAX ) 0.25 MG tablet [Pharmacy Med Name: ALPRAZOLAM  0.25MG   TABLETS] 60 tablet     Sig: TAKE 1 TABLET(0.25 MG) BY MOUTH TWICE DAILY     Not Delegated - Psychiatry: Anxiolytics/Hypnotics 2 Failed - 02/14/2024 10:39 AM      Failed - This refill cannot be delegated      Failed - Valid encounter within last 6 months    Recent Outpatient Visits           1 week ago Hordeolum externum of right upper eyelid   [place] [facility] Raina Bunting, DO   2 weeks ago Episodic migraine   [facility] Raina Bunting, DO   2 months ago Episodic migraine   [place] [facility] Raina Bunting, DO       Future Appointments             In 2 months Agbor-Etang, Polly Brink, MD [facility] at [facility] - Urine Drug Screen completed in last 360 days      Passed - Patient is not pregnant

## 2024-02-14 NOTE — Telephone Encounter (Signed)
 Requested medication (s) are due for refill today: yes  Requested medication (s) are on the active medication list: yes  Last refill:  Zofran : 12/15/23 #30       Trulicity : 10/06/23 2 ml 2 RF                 Alprazolam : dc'd 09/27/23 Dr Leslee Rase "stop taking at discharge"  Future visit scheduled: yes  Notes to clinic:  overdue lab work-   Requested Prescriptions  Pending Prescriptions Disp Refills   ondansetron  (ZOFRAN -ODT) 4 MG disintegrating tablet [Pharmacy Med Name: ONDANSETRON  ODT 4MG  TABLETS] 30 tablet     Sig: DISSOLVE 1 TABLET(4 MG) ON THE TONGUE EVERY 8 HOURS AS NEEDED FOR NAUSEA OR VOMITING     Not Delegated - Gastroenterology: Antiemetics - ondansetron  Failed - 02/14/2024 10:40 AM      Failed - This refill cannot be delegated      Failed - Valid encounter within last 6 months    Recent Outpatient Visits           1 week ago Hordeolum externum of right upper eyelid   [place] [facility] [place], [name], DO   2 weeks ago Episodic migraine   [place] [facility] [place], [name], DO   2 months ago Episodic migraine   [place] [facility] Raina Bunting, DO       Future Appointments             In 2 months Agbor-Etang, Polly Brink, MD [facility] at [facility] - AST in normal range and within 360 days    AST  Date Value Ref Range Status  09/21/2023 17 15 - 41 U/L Final   SGOT(AST)  Date Value Ref Range Status  01/07/2015 24 U/L Final    Comment:    15-41 NOTE: New Reference Range  12/16/14          Passed - ALT in normal range and within 360 days    ALT  Date Value Ref Range Status  09/21/2023 26 0 - 44 U/L Final   SGPT (ALT)  Date Value Ref Range Status  01/07/2015 21 U/L Final    Comment:    14-54 NOTE: New Reference Range  12/16/14           TRULICITY  1.5 MG/0.5ML SOAJ [Pharmacy Med Name: TRULICITY  1.5MG /0.5ML INJ (4 PENS)] 2 mL      Sig: ADMINISTER 1.5 MG UNDER THE SKIN 1 TIME A WEEK     Endocrinology:  Diabetes - GLP-1 Receptor Agonists Failed - 02/14/2024 10:40 AM      Failed - HBA1C is between 0 and 7.9 and within 180 days    Hgb A1c MFr Bld  Date Value Ref Range Status  09/21/2023 9.0 (H) 4.8 - 5.6 % Final    Comment:    (NOTE) Pre diabetes:          5.7%-6.4%  Diabetes:              >6.4%  Glycemic control for   <7.0% adults with diabetes          Failed - Valid encounter within last 6 months    Recent Outpatient Visits           1 week ago Hordeolum externum of right upper eyelid   [place] [facility] [place], [name], DO  2 weeks ago Episodic migraine   [place] [facility] [place], [name], DO   2 months ago Episodic migraine   [place] [facility] [place], [name], DO       Future Appointments             In 2 months Agbor-Etang, Polly Brink, MD [facility] at [facility]             ALPRAZolam  (XANAX ) 0.25 MG tablet [Pharmacy Med Name: ALPRAZOLAM  0.25MG  TABLETS] 60 tablet     Sig: TAKE 1 TABLET(0.25 MG) BY MOUTH TWICE DAILY     Not Delegated - Psychiatry: Anxiolytics/Hypnotics 2 Failed - 02/14/2024 10:40 AM      Failed - This refill cannot be delegated      Failed - Valid encounter within last 6 months    Recent Outpatient Visits           1 week ago Hordeolum externum of right upper eyelid   [place] [facility] [place], [name], DO   2 weeks ago Episodic migraine   [place] [facility] Raina Bunting, DO   2 months ago Episodic migraine   [place] [facility] Raina Bunting, DO       Future Appointments             In 2 months Agbor-Etang, Polly Brink, MD [facility] at [facility] - Urine Drug Screen completed in last 360 days      Passed - Patient is not pregnant       Signed Prescriptions Disp Refills   hydrOXYzine  (ATARAX ) 25 MG tablet 270 tablet 0    Sig: TAKE 1 TABLET(25 MG) BY MOUTH THREE TIMES DAILY AS NEEDED FOR ANXIETY     Ear, Nose, and Throat:  Antihistamines 2 Failed - 02/14/2024 10:40 AM      Failed - Valid encounter within last 12 months    Recent Outpatient Visits           1 week ago Hordeolum externum of right upper eyelid   [place] [facility] [place], [name], DO   2 weeks ago Episodic migraine   [place] [facility] Raina Bunting, DO   2 months ago Episodic migraine    [facility] [place], [name], DO       Future Appointments             In 2 months Agbor-Etang, Polly Brink, MD [facility] at [facility] - Cr in normal range and within 360 days    Creat  Date Value Ref Range Status  07/30/2018 0.89 0.50 - 1.05 mg/dL Final    Comment:    For patients >16 years of age, the reference limit for Creatinine is approximately 13% higher for people identified as African-American. .    Creatinine, Ser  Date Value Ref Range Status  09/22/2023 0.65 0.44 - 1.00 mg/dL Final

## 2024-02-19 ENCOUNTER — Encounter (HOSPITAL_COMMUNITY): Payer: Self-pay

## 2024-02-21 DIAGNOSIS — C4491 Basal cell carcinoma of skin, unspecified: Secondary | ICD-10-CM | POA: Diagnosis not present

## 2024-02-23 ENCOUNTER — Other Ambulatory Visit: Payer: Self-pay

## 2024-02-23 DIAGNOSIS — E1165 Type 2 diabetes mellitus with hyperglycemia: Secondary | ICD-10-CM

## 2024-02-23 MED ORDER — INSULIN DETEMIR 100 UNIT/ML FLEXPEN: 20.0000 [IU] | PEN_INJECTOR | Freq: Every day | SUBCUTANEOUS | 2 refills | Status: DC

## 2024-02-26 ENCOUNTER — Telehealth: Payer: Self-pay

## 2024-02-26 ENCOUNTER — Telehealth: Admitting: Family Medicine

## 2024-02-26 ENCOUNTER — Encounter: Payer: Self-pay | Admitting: Family Medicine

## 2024-02-26 DIAGNOSIS — Z794 Long term (current) use of insulin: Secondary | ICD-10-CM | POA: Diagnosis not present

## 2024-02-26 DIAGNOSIS — G43909 Migraine, unspecified, not intractable, without status migrainosus: Secondary | ICD-10-CM | POA: Diagnosis not present

## 2024-02-26 DIAGNOSIS — J432 Centrilobular emphysema: Secondary | ICD-10-CM | POA: Diagnosis not present

## 2024-02-26 DIAGNOSIS — M5030 Other cervical disc degeneration, unspecified cervical region: Secondary | ICD-10-CM

## 2024-02-26 DIAGNOSIS — R110 Nausea: Secondary | ICD-10-CM

## 2024-02-26 DIAGNOSIS — E1165 Type 2 diabetes mellitus with hyperglycemia: Secondary | ICD-10-CM

## 2024-02-26 MED ORDER — LANTUS SOLOSTAR 100 UNIT/ML ~~LOC~~ SOPN: 20.0000 [IU] | PEN_INJECTOR | Freq: Every day | SUBCUTANEOUS | 2 refills | Status: AC

## 2024-02-26 MED ORDER — EMGALITY 120 MG/ML ~~LOC~~ SOAJ: 240.0000 mg | Freq: Once | SUBCUTANEOUS | 0 refills | Status: AC

## 2024-02-26 NOTE — Telephone Encounter (Signed)
 Received fax from [facility], Levemir  flex pen is discontinued. What would you like to switch patient too?

## 2024-02-26 NOTE — Progress Notes (Addendum)
 Subjective:    Patient ID: Christy Mayer, female    DOB: [birth year], 58 y.o.   MRN: [id number]  Christy Mayer is a 58 y.o. female presenting on 02/26/2024 for Migraine and Headache   Virtual / Telehealth Encounter - Video Visit via MyChart The purpose of this virtual visit is to provide medical care while limiting exposure to the novel coronavirus (COVID19) for both patient and office staff.  Consent was obtained for remote visit:  Yes.   Answered questions that patient had about telehealth interaction:  Yes.   I discussed the limitations, risks, security and privacy concerns of performing an evaluation and management service by video/telephone. I also discussed with the patient that there may be a patient responsible charge related to this service. The patient expressed understanding and agreed to proceed.  Patient Location: Home Provider Location: [facility] (Office)  Participants in virtual visit: - Patient: Christy Mayer - CMA: Nolberto Batty CMA - Provider: Dr Romeo Co   HPI  Discussed the use of AI scribe software for clinical note transcription with the patient, who gave verbal consent to proceed.  History of Present Illness   Christy Mayer is a 58 year old female with chronic migraines who presents with persistent migraine symptoms despite multiple treatments.  Episodic Migraine Vertigo / Nausea Chronic Neck and Back Pain / Degenerative Disc Disease  She experiences ongoing migraines that have not been relieved by her current medication regimen. She has tried multiple treatments including Nurtec, Ubrelvy , sumatriptan , and rizatriptan , but none have provided significant relief. Nurtec has had a slightly better effect than other medications but does not alleviate her blurred or double vision associated with the migraines. - She has also tried preventative therapy with Gabapentin  and Duloxetine  in past  She recently underwent (Dr Cathryn Cobb)  Dermatology biopsy surgical procedure on her back on Feb 21, 2024, and was prescribed hydrocodone -acetaminophen  5-325 mg for postoperative pain. However, this medication has not provided any relief for her migraine symptoms.  She is awaiting an appointment with a neurologist scheduled for May 07, 2024, as her current treatments have been ineffective. She has not yet received results from recent MRI scans of her cervical and lumbar spine, which are pending.  Additionally, she reports having a cold with symptoms of a 'stuffy head' acquired from frequent doctor and pharmacy visits.  She is currently out of her diabetes medication, Levemir , due to a supply issue at [facility]. She takes 20 units of insulin  daily. It is now preferred to use Lantus  insulin . Request new order.  Blurred and double vision are associated with her migraines. No relief from hydrocodone  for her headaches.             01/25/2024    9:33 AM 12/15/2023   11:08 AM 10/06/2023   11:00 AM  Depression screen PHQ 2/9  Decreased Interest 2 0 1  Down, Depressed, Hopeless 0 0 0  PHQ - 2 Score 2 0 1  Altered sleeping 0  3  Tired, decreased energy 2  3  Change in appetite 0  3  Feeling bad or failure about yourself  0  0  Trouble concentrating 0  3  Moving slowly or fidgety/restless 0  0  Suicidal thoughts 0  0  PHQ-9 Score 4  13  Difficult doing work/chores Very difficult         01/25/2024    9:34 AM 10/06/2023   11:00 AM 02/15/2022    1:21 PM 09/18/2018  1:17 PM  GAD 7 : Generalized Anxiety Score  Nervous, Anxious, on Edge 0 3 1 3   Control/stop worrying 0 0 0 1  Worry too much - different things 0 1 0 2  Trouble relaxing 0 0 0 3  Restless 0 0 0 2  Easily annoyed or irritable 0 2 0 1  Afraid - awful might happen 0 0 0 2  Total GAD 7 Score 0 6 1 14   Anxiety Difficulty Not difficult at all  Not difficult at all Somewhat difficult    Social History   Tobacco Use   Smoking status: Every Day    Current  packs/day: 0.50    Average packs/day: 0.5 packs/day for 30.0 years (15.0 ttl pk-yrs)    Types: Cigarettes    Passive exposure: Never   Smokeless tobacco: Never  Vaping Use   Vaping status: Never Used  Substance Use Topics   Alcohol  use: Yes    Alcohol /week: 0.0 standard drinks of alcohol     Comment: occasionally    Drug use: No    Review of Systems Per HPI unless specifically indicated above     Objective:     LMP 08/11/2014   Wt Readings from Last 3 Encounters:  01/25/24 240 lb 8 oz (109.1 kg)  12/15/23 238 lb 11.2 oz (108.3 kg)  10/06/23 241 lb (109.3 kg)     Physical Exam  Note examination was completely remotely via video observation objective data only  Gen - well-appearing, uncomfortable with headache HEENT - eyes appear clear without discharge or redness Heart/Lungs - cannot examine virtually - observed no evidence of coughing or labored breathing. Abd - cannot examine virtually  Skin - face visible today- no rash Neuro - awake, alert, oriented Psych - not anxious appearing   Results for orders placed or performed during the hospital encounter of 09/21/23  Comprehensive metabolic panel   Collection Time: 09/21/23 12:53 AM  Result Value Ref Range   Sodium 133 (L) 135 - 145 mmol/L   Potassium 2.9 (L) 3.5 - 5.1 mmol/L   Chloride 96 (L) 98 - 111 mmol/L   CO2 22 22 - 32 mmol/L   Glucose, Bld 318 (H) 70 - 99 mg/dL   BUN 15 6 - 20 mg/dL   Creatinine, Ser 1.61 0.44 - 1.00 mg/dL   Calcium 8.7 (L) 8.9 - 10.3 mg/dL   Total Protein 7.7 6.5 - 8.1 g/dL   Albumin 4.2 3.5 - 5.0 g/dL   AST 17 15 - 41 U/L   ALT 26 0 - 44 U/L   Alkaline Phosphatase 80 38 - 126 U/L   Total Bilirubin 0.6 <1.2 mg/dL   GFR, Estimated >09 >60 mL/min   Anion gap 15 5 - 15  Ethanol   Collection Time: 09/21/23 12:53 AM  Result Value Ref Range   Alcohol , Ethyl (B) 152 (H) <10 mg/dL  Salicylate level   Collection Time: 09/21/23 12:53 AM  Result Value Ref Range   Salicylate Lvl <7.0 (L)  7.0 - 30.0 mg/dL  Acetaminophen  level   Collection Time: 09/21/23 12:53 AM  Result Value Ref Range   Acetaminophen  (Tylenol ), Serum <10 (L) 10 - 30 ug/mL  cbc   Collection Time: 09/21/23 12:53 AM  Result Value Ref Range   WBC 6.8 4.0 - 10.5 K/uL   RBC 4.55 3.87 - 5.11 MIL/uL   Hemoglobin 15.1 (H) 12.0 - 15.0 g/dL   HCT 45.4 09.8 - 11.9 %   MCV 96.0 80.0 - 100.0 fL  MCH 33.2 26.0 - 34.0 pg   MCHC 34.6 30.0 - 36.0 g/dL   RDW 82.9 (L) 56.2 - 13.0 %   Platelets 189 150 - 400 K/uL   nRBC 0.0 0.0 - 0.2 %  Acetaminophen  level   Collection Time: 09/21/23  6:10 AM  Result Value Ref Range   Acetaminophen  (Tylenol ), Serum <10 (L) 10 - 30 ug/mL  Salicylate level   Collection Time: 09/21/23  6:10 AM  Result Value Ref Range   Salicylate Lvl <7.0 (L) 7.0 - 30.0 mg/dL  HIV Antibody (routine testing w rflx)   Collection Time: 09/21/23  6:10 AM  Result Value Ref Range   HIV Screen 4th Generation wRfx Non Reactive Non Reactive  Hemoglobin A1c   Collection Time: 09/21/23  6:10 AM  Result Value Ref Range   Hgb A1c MFr Bld 9.0 (H) 4.8 - 5.6 %   Mean Plasma Glucose 211.6 mg/dL  CBG monitoring, ED   Collection Time: 09/21/23  8:09 AM  Result Value Ref Range   Glucose-Capillary 269 (H) 70 - 99 mg/dL  Basic metabolic panel   Collection Time: 09/21/23 10:26 AM  Result Value Ref Range   Sodium 133 (L) 135 - 145 mmol/L   Potassium 5.4 (H) 3.5 - 5.1 mmol/L   Chloride 102 98 - 111 mmol/L   CO2 24 22 - 32 mmol/L   Glucose, Bld 278 (H) 70 - 99 mg/dL   BUN 15 6 - 20 mg/dL   Creatinine, Ser 8.65 0.44 - 1.00 mg/dL   Calcium 8.2 (L) 8.9 - 10.3 mg/dL   GFR, Estimated >78 >46 mL/min   Anion gap 7 5 - 15  Magnesium    Collection Time: 09/21/23 10:26 AM  Result Value Ref Range   Magnesium  1.8 1.7 - 2.4 mg/dL  CBG monitoring, ED   Collection Time: 09/21/23 12:24 PM  Result Value Ref Range   Glucose-Capillary 188 (H) 70 - 99 mg/dL  CBG monitoring, ED   Collection Time: 09/21/23  4:28 PM  Result  Value Ref Range   Glucose-Capillary 177 (H) 70 - 99 mg/dL  Potassium   Collection Time: 09/21/23  7:04 PM  Result Value Ref Range   Potassium 3.8 3.5 - 5.1 mmol/L  CBG monitoring, ED   Collection Time: 09/21/23  9:35 PM  Result Value Ref Range   Glucose-Capillary 262 (H) 70 - 99 mg/dL  CBC   Collection Time: 09/22/23  5:20 AM  Result Value Ref Range   WBC 8.6 4.0 - 10.5 K/uL   RBC 3.82 (L) 3.87 - 5.11 MIL/uL   Hemoglobin 12.7 12.0 - 15.0 g/dL   HCT 96.2 95.2 - 84.1 %   MCV 99.5 80.0 - 100.0 fL   MCH 33.2 26.0 - 34.0 pg   MCHC 33.4 30.0 - 36.0 g/dL   RDW 32.4 40.1 - 02.7 %   Platelets 189 150 - 400 K/uL   nRBC 0.0 0.0 - 0.2 %  Basic metabolic panel   Collection Time: 09/22/23  5:20 AM  Result Value Ref Range   Sodium 134 (L) 135 - 145 mmol/L   Potassium 3.9 3.5 - 5.1 mmol/L   Chloride 104 98 - 111 mmol/L   CO2 25 22 - 32 mmol/L   Glucose, Bld 197 (H) 70 - 99 mg/dL   BUN 12 6 - 20 mg/dL   Creatinine, Ser 2.53 0.44 - 1.00 mg/dL   Calcium 7.9 (L) 8.9 - 10.3 mg/dL   GFR, Estimated >66 >44 mL/min   Anion  gap 5 5 - 15  Magnesium    Collection Time: 09/22/23  5:20 AM  Result Value Ref Range   Magnesium  2.2 1.7 - 2.4 mg/dL  CBG monitoring, ED   Collection Time: 09/22/23  8:33 AM  Result Value Ref Range   Glucose-Capillary 212 (H) 70 - 99 mg/dL  Urinalysis, Routine w reflex microscopic -Urine, Clean Catch   Collection Time: 09/22/23  8:59 AM  Result Value Ref Range   Color, Urine YELLOW (A) YELLOW   APPearance HAZY (A) CLEAR   Specific Gravity, Urine 1.010 1.005 - 1.030   pH 5.0 5.0 - 8.0   Glucose, UA NEGATIVE NEGATIVE mg/dL   Hgb urine dipstick NEGATIVE NEGATIVE   Bilirubin Urine NEGATIVE NEGATIVE   Ketones, ur NEGATIVE NEGATIVE mg/dL   Protein, ur NEGATIVE NEGATIVE mg/dL   Nitrite NEGATIVE NEGATIVE   Leukocytes,Ua NEGATIVE NEGATIVE   RBC / HPF 0-5 0 - 5 RBC/hpf   WBC, UA 0-5 0 - 5 WBC/hpf   Bacteria, UA RARE (A) NONE SEEN   Squamous Epithelial / HPF 6-10 0 - 5  /HPF  Urine Drug Screen, Qualitative   Collection Time: 09/22/23  8:59 AM  Result Value Ref Range   Tricyclic, Ur Screen NONE DETECTED NONE DETECTED   Amphetamines, Ur Screen NONE DETECTED NONE DETECTED   MDMA (Ecstasy)Ur Screen NONE DETECTED NONE DETECTED   Cocaine Metabolite,Ur [place] NONE DETECTED NONE DETECTED   Opiate, Ur Screen NONE DETECTED NONE DETECTED   Phencyclidine (PCP) Ur S NONE DETECTED NONE DETECTED   Cannabinoid 50 Ng, Ur [place] NONE DETECTED NONE DETECTED   Barbiturates, Ur Screen NONE DETECTED NONE DETECTED   Benzodiazepine, Ur Scrn NONE DETECTED NONE DETECTED   Methadone Scn, Ur NONE DETECTED NONE DETECTED  ECHOCARDIOGRAM COMPLETE   Collection Time: 09/22/23  9:57 AM  Result Value Ref Range   Weight 4,056.46 oz   Height 67 in   BP 117/64 mmHg   Ao pk vel 1.79 m/s   AV Area VTI 2.93 cm2   AR max vel 2.56 cm2   AV Mean grad 6.0 mmHg   AV Peak grad 12.8 mmHg   Single Plane A2C EF 59.1 %   Single Plane A4C EF 66.0 %   Calc EF 62.3 %   S' Lateral 2.90 cm   AV Area mean vel 2.52 cm2   Area-P 1/2 3.10 cm2   MV VTI 3.01 cm2   Est EF 65 - 70%   CBG monitoring, ED   Collection Time: 09/22/23 11:39 AM  Result Value Ref Range   Glucose-Capillary 122 (H) 70 - 99 mg/dL  CBG monitoring, ED   Collection Time: 09/22/23  4:44 PM  Result Value Ref Range   Glucose-Capillary 150 (H) 70 - 99 mg/dL  CBG monitoring, ED   Collection Time: 09/22/23 11:20 PM  Result Value Ref Range   Glucose-Capillary 191 (H) 70 - 99 mg/dL  CBG monitoring, ED   Collection Time: 09/23/23  7:57 AM  Result Value Ref Range   Glucose-Capillary 187 (H) 70 - 99 mg/dL   Comment 1 Notify RN    Comment 2 Document in Chart   CBG monitoring, ED   Collection Time: 09/23/23 11:37 AM  Result Value Ref Range   Glucose-Capillary 202 (H) 70 - 99 mg/dL  CBG monitoring, ED   Collection Time: 09/23/23  4:43 PM  Result Value Ref Range   Glucose-Capillary 120 (H) 70 - 99 mg/dL  CBG monitoring, ED   Collection  Time: 09/23/23  9:08 PM  Result Value Ref Range   Glucose-Capillary 163 (H) 70 - 99 mg/dL  CBG monitoring, ED   Collection Time: 09/24/23  7:42 AM  Result Value Ref Range   Glucose-Capillary 178 (H) 70 - 99 mg/dL      Assessment & Plan:   Problem List Items Addressed This Visit     Centrilobular emphysema (HCC)   Episodic migraine - Primary   Relevant Medications   EMGALITY  120 MG/ML SOAJ   Uncontrolled type 2 diabetes mellitus with hyperglycemia, with long-term current use of insulin  (HCC)   Relevant Medications   insulin  glargine (LANTUS  SOLOSTAR) 100 UNIT/ML Solostar Pen   Other Visit Diagnoses       DDD (degenerative disc disease), cervical         Nausea         Long term current use of insulin  (HCC)             Episodic Migraine Episodic migraines with blurred and double vision, >4-14 headache days per month - question if she may qualify for chronic migraine diagnosis  Episodic and severe migraines, not adequately controlled with current regimen  Nausea vomiting Neck pain / chronic  Previous treatments ineffective. Neurology referral scheduled for May 07, 2024.  Considering Emgality  for prevention; coverage uncertain, may need neurologist approval. Tried failed - Gabapentin , Duloxetine  for prevention and Sumatriptan , Rizatriptan , Nurtec, Ubrelvy  for abortive - Order Emgality  (galcanezumab ) for migraine prevention, starting with a loading dose of two 240 mg autoinjectors. - Instruct her to administer the loading dose back-to-back and then continue with monthly injections. - Monitor for insurance coverage issues; neurologist approval may be required. - Await neurology consultation on May 07, 2024.  Dermatology biopsy / Post-surgical pain Post-surgical pain following dermatological procedure on Feb 21, 2024. Hydrocodone -acetaminophen  prescribed for pain management.  Diabetes Mellitus Type 2 Due for A1c Levemir  unavailable. Switch to Lantus  - Discontinue  Levemir  and initiate Lantus  at 20 units daily.  Short Term Disability updates  Charline Contras for the Short Term Disability Claim [phone] Waiting on medical record / attending physician update request She remains unable to work due to Episodic Migraine Headaches, Nausea, Vertigo, and Chronic Neck and Back Pain    No orders of the defined types were placed in this encounter.   Meds ordered this encounter  Medications   insulin  glargine (LANTUS  SOLOSTAR) 100 UNIT/ML Solostar Pen    Sig: Inject 20 Units into the skin daily.    Dispense:  15 mL    Refill:  2   EMGALITY  120 MG/ML SOAJ    Sig: Inject 240 mg into the skin once for 1 dose. Loading dose, x 1. Then monthly 120mg     Dispense:  2 mL    Refill:  0    Follow up plan: Return if symptoms worsen or fail to improve.   Patient verbalizes understanding with the above medical recommendations including the limitation of remote medical advice.  Specific follow-up and call-back criteria were given for patient to follow-up or seek medical care more urgently if needed.  Total duration of direct patient care provided via video conference: 12 minutes   Domingo Friend, DO [facility] 02/26/2024, 2:47 PM

## 2024-02-26 NOTE — Telephone Encounter (Signed)
 She uses [facility] not [facility]. I changed her Levemir  over to Lantus   Domingo Friend, DO [facility] 02/26/2024, 3:03 PM

## 2024-02-26 NOTE — Patient Instructions (Signed)
   Please schedule a Follow-up Appointment to: No follow-ups on file.  If you have any other questions or concerns, please feel free to call the office or send a message through MyChart. You may also schedule an earlier appointment if necessary.  Additionally, you may be receiving a survey about your experience at our office within a few days to 1 week by e-mail or mail. We value your feedback.  Saralyn Pilar, DO [facility], [place]

## 2024-02-27 ENCOUNTER — Other Ambulatory Visit (HOSPITAL_COMMUNITY): Payer: Self-pay

## 2024-02-27 ENCOUNTER — Telehealth: Payer: Self-pay

## 2024-02-27 NOTE — Telephone Encounter (Signed)
 Pharmacy Patient Advocate Encounter   Received notification from Onbase that prior authorization for Emgality  120MG /ML auto-injectors (migraine) is required/requested.   Insurance verification completed.   The patient is insured through [employer] [facility] .   Per test claim: PA required and submitted KEY/EOC/Request #: [id number] from 02/27/2024 to 05/27/2024

## 2024-03-05 DIAGNOSIS — M5416 Radiculopathy, lumbar region: Secondary | ICD-10-CM | POA: Diagnosis not present

## 2024-03-05 DIAGNOSIS — M25552 Pain in left hip: Secondary | ICD-10-CM | POA: Diagnosis not present

## 2024-03-05 DIAGNOSIS — M5412 Radiculopathy, cervical region: Secondary | ICD-10-CM | POA: Diagnosis not present

## 2024-03-05 DIAGNOSIS — M5442 Lumbago with sciatica, left side: Secondary | ICD-10-CM | POA: Diagnosis not present

## 2024-03-05 DIAGNOSIS — M542 Cervicalgia: Secondary | ICD-10-CM | POA: Diagnosis not present

## 2024-03-05 DIAGNOSIS — G8929 Other chronic pain: Secondary | ICD-10-CM | POA: Diagnosis not present

## 2024-03-05 DIAGNOSIS — M5441 Lumbago with sciatica, right side: Secondary | ICD-10-CM | POA: Diagnosis not present

## 2024-03-08 ENCOUNTER — Encounter: Payer: Self-pay | Admitting: Family Medicine

## 2024-03-08 DIAGNOSIS — G43909 Migraine, unspecified, not intractable, without status migrainosus: Secondary | ICD-10-CM

## 2024-03-08 DIAGNOSIS — E1165 Type 2 diabetes mellitus with hyperglycemia: Secondary | ICD-10-CM

## 2024-03-08 MED ORDER — EMGALITY 120 MG/ML ~~LOC~~ SOAJ: SUBCUTANEOUS | 0 refills | Status: DC

## 2024-03-11 ENCOUNTER — Other Ambulatory Visit: Payer: Self-pay | Admitting: Medical Genetics

## 2024-03-11 MED ORDER — ACCU-CHEK GUIDE TEST VI STRP: ORAL_STRIP | 3 refills | Status: AC

## 2024-03-11 MED ORDER — ACCU-CHEK GUIDE ME W/DEVICE KIT: PACK | 0 refills | Status: AC

## 2024-03-11 MED ORDER — ACCU-CHEK SOFTCLIX LANCETS MISC
3 refills | Status: AC
Start: 2024-03-11 — End: ?

## 2024-03-11 NOTE — Addendum Note (Signed)
 Addended by: Raina Bunting on: 03/11/2024 05:52 PM   Modules accepted: Orders

## 2024-03-25 ENCOUNTER — Encounter: Payer: Self-pay | Admitting: Neurology

## 2024-03-25 ENCOUNTER — Telehealth: Payer: Self-pay | Admitting: *Deleted

## 2024-03-25 ENCOUNTER — Other Ambulatory Visit: Payer: Self-pay

## 2024-03-25 ENCOUNTER — Ambulatory Visit: Admitting: Neurology

## 2024-03-25 VITALS — BP 117/75 | HR 73 | Ht 67.0 in | Wt 237.2 lb

## 2024-03-25 DIAGNOSIS — G43711 Chronic migraine without aura, intractable, with status migrainosus: Secondary | ICD-10-CM | POA: Diagnosis not present

## 2024-03-25 DIAGNOSIS — H532 Diplopia: Secondary | ICD-10-CM | POA: Diagnosis not present

## 2024-03-25 DIAGNOSIS — H539 Unspecified visual disturbance: Secondary | ICD-10-CM | POA: Diagnosis not present

## 2024-03-25 DIAGNOSIS — R51 Headache with orthostatic component, not elsewhere classified: Secondary | ICD-10-CM

## 2024-03-25 DIAGNOSIS — G8929 Other chronic pain: Secondary | ICD-10-CM | POA: Diagnosis not present

## 2024-03-25 DIAGNOSIS — J449 Chronic obstructive pulmonary disease, unspecified: Secondary | ICD-10-CM

## 2024-03-25 DIAGNOSIS — R519 Headache, unspecified: Secondary | ICD-10-CM | POA: Diagnosis not present

## 2024-03-25 DIAGNOSIS — G4719 Other hypersomnia: Secondary | ICD-10-CM

## 2024-03-25 MED ORDER — BOTOX 200 UNITS IJ SOLR
INTRAMUSCULAR | 3 refills | Status: AC
  Filled 2024-03-25: qty 1, fill #0
  Filled 2024-04-08: qty 1, 90d supply, fill #0
  Filled 2024-07-22: qty 1, 90d supply, fill #1

## 2024-03-25 NOTE — Progress Notes (Addendum)
 [facility]    Provider:  Dr Ines Requesting Provider: Edman Blunt * Primary Care Provider:  Edman Blunt PARAS, DO  CC:  Worsening headache  HPI:  Christy Mayer is a 58 y.o. female here as requested by Edman Blunt * for migraines. has Tobacco use disorder; Dyspnea; GAD (generalized anxiety disorder); Obsessive-compulsive personality trait; Major depression, recurrent, chronic (HCC); Chronic left-sided low back pain with left-sided sciatica; Centrilobular emphysema (HCC); Obesity (BMI 35.0-39.9 without comorbidity); Osteoarthritis, multiple sites; Elevated hemoglobin A1c; Mixed hyperlipidemia; Gastroesophageal reflux disease; Ulnar neuropathy of right upper extremity; Osteoarthritis of spine with radiculopathy, cervical region; Elevated BP without diagnosis of hypertension; Episodic migraine; Obesity (BMI 30-39.9); CAP (community acquired pneumonia); COPD exacerbation (HCC); Depression with anxiety; HTN (hypertension); Uncontrolled type 2 diabetes mellitus with hyperglycemia, with long-term current use of insulin  (HCC); Hyponatremia; Atrial fibrillation with RVR (HCC); Drug overdose, intentional, initial encounter (HCC); Hypokalemia; Suicidal behavior with attempted self-injury (HCC); Anxiety; Overdose; MDD (major depressive disorder), recurrent severe, without psychosis (HCC); and Intentional drug overdose (HCC) on their problem list.  Patient is here and reports she has had migraines for many years, teenager, she was adopted and doesn;t know her family history but daughter has migraines.   Pulsating/pounding/throbbing, light and sound sensitivity, nausea, vomiting, hurts to move, more on the left, she got covid this year in February and since then she has daily migraines, she has had the loading dose and one injection of emgality , about 6 weeks and not helping so far,   migraines are moderate to severe and can last 24 hours or more, continuous,  unilateral on the left side, feels like a machete, continuous, blurry and double vision, worsening headaches, blurry vision she has an eye doctor appointment, she wakes up daily with a headache can be worse positionally she has COPD and sleep elevated, she has not ben to see her pulmonologist for 3 years, . They are significantly affecting quality of life with work, family.  She has daily migraines, for the last > 3 months, no medication overuse(she only takes emgality ) not taking ibuprofen  or tylenol  because she is worried about it messing up her stomach,the headaches are in the morning and progressively worse, has colors sometimes before the migraines but has at least 15 migraine days a month without aura.   From a thorough review of records and patient report, Medications tried that can be used in migraine/headache management greater than 3 months include: Lifestyle modification, headache diaries, better sleep hygiene, exercise, management of migraine triggers, OTC and prescribed analgesics/nsaids such as ibuprofen , excedrin, alleve and others, amlodipine , fioricet , flexeril , cymbalta , emgality , lexapro , gabapentin , Diclofenac , lamictal , lisinopril , nurtec, zofran , sumatriptan , trazodone , tizanidine , ubrelvy  rizatriptan   I reviewed Dr. Edman' notes from 02/17/2024: Patient has migraines, with vertigo and nausea, ongoing migraines have not been relieved by her current medication regimen, she is tried multiple treatments including Nurtec, Ubrelvy , sumatriptan  hand and rizatriptan .  Nurtec has had a slightly better effect than other medications but does not alleviate her blurred or double vision associate with migraines.  She is also tried gabapentin  and duloxetine .   Reviewed notes, labs and imaging from outside physicians, which showed:  Personally reviewed images and bloodwork, agree with radiology report  01/18/2019:  CLINICAL DATA:  Acute headache   EXAM: CT HEAD WITHOUT CONTRAST    TECHNIQUE: Contiguous axial images were obtained from the base of the skull through the vertex without intravenous contrast.   COMPARISON:  09/14/2015   FINDINGS: Brain: There is no mass, hemorrhage or  extra-axial collection. The size and configuration of the ventricles and extra-axial CSF spaces are normal. The brain parenchyma is normal, without acute or chronic infarction.   Vascular: No abnormal hyperdensity of the major intracranial arteries or dural venous sinuses. No intracranial atherosclerosis.   Skull: The visualized skull base, calvarium and extracranial soft tissues are normal.   Sinuses/Orbits: No fluid levels or advanced mucosal thickening of the visualized paranasal sinuses. No mastoid or middle ear effusion. The orbits are normal.   IMPRESSION: Normal head CT.   09/22/2023: bmp slightly dec sodium, elevated glucose, nml bun creat 12/0.65, cbc unremarkable, hgba1c uncontrolled 9, elevated ethanol   Review of Systems: Patient complains of symptoms per HPI as well as the following symptoms neck pain, copd, gerd. Pertinent negatives and positives per HPI. All others negative.   Social History   Socioeconomic History   Marital status: Married    Spouse name: Not on file   Number of children: Not on file   Years of education: Not on file   Highest education level: Some college, no degree  Occupational History   Occupation: work from home  Tobacco Use   Smoking status: Every Day    Current packs/day: 0.50    Average packs/day: 0.5 packs/day for 30.0 years (15.0 ttl pk-yrs)    Types: Cigarettes    Passive exposure: Never   Smokeless tobacco: Never  Vaping Use   Vaping status: Never Used  Substance and Sexual Activity   Alcohol  use: Yes    Alcohol /week: 0.0 standard drinks of alcohol     Comment: occasionally    Drug use: No   Sexual activity: Yes    Birth control/protection: None  Other Topics Concern   Not on file  Social History Narrative    Caffiene 2 cups in am coffee   Lives home husband, grown kids, cats, dogs gecke and chickens   Working: STD disability from migraines (pcp).     Social Drivers of Corporate investment banker Strain: Low Risk  (01/30/2024)   Received from [facility]   Overall Financial Resource Strain (CARDIA)    Difficulty of Paying Living Expenses: Not very hard  Food Insecurity: Food Insecurity Present (01/30/2024)   Received from [facility]   Hunger Vital Sign    Within the past 12 months, you worried that your food would run out before you got the money to buy more.: Sometimes true    Within the past 12 months, the food you bought just didn't last and you didn't have money to get more.: Never true  Transportation Needs: No Transportation Needs (01/30/2024)   Received from [facility] - Transportation    In the past 12 months, has lack of transportation kept you from medical appointments or from getting medications?: No    Lack of Transportation (Non-Medical): No  Recent Concern: Transportation Needs - Unmet Transportation Needs (12/15/2023)   PRAPARE - Administrator, Civil Service (Medical): Yes    Lack of Transportation (Non-Medical): No  Physical Activity: Unknown (12/15/2023)   Exercise Vital Sign    Days of Exercise per Week: 0 days    Minutes of Exercise per Session: Not on file  Stress: No Stress Concern Present (12/15/2023)   [employer] of Occupational Health - Occupational Stress Questionnaire    Feeling of Stress : Only a little  Social Connections: Moderately Isolated (12/15/2023)   Social Connection and Isolation Panel    Frequency  of Communication with Friends and Family: More than three times a week    Frequency of Social Gatherings with Friends and Family: Once a week    Attends Religious Services: Never    Database administrator or Organizations: No    Attends Engineer, structural: Not on file     Marital Status: Married  Catering manager Violence: Not At Risk (09/24/2023)   Humiliation, Afraid, Rape, and Kick questionnaire    Fear of Current or Ex-Partner: No    Emotionally Abused: No    Physically Abused: No    Sexually Abused: No    Family History  Adopted: Yes    Past Medical History:  Diagnosis Date   Arthritis    Asthma    Back pain    COPD (chronic obstructive pulmonary disease) (HCC)    Emphysema lung (HCC)    Heart palpitations    Miscarriage    Mitral valve prolapse     Patient Active Problem List   Diagnosis Date Noted   Overdose 09/22/2023   MDD (major depressive disorder), recurrent severe, without psychosis (HCC) 09/22/2023   Intentional drug overdose (HCC) 09/22/2023   Atrial fibrillation with RVR (HCC) 09/21/2023   Drug overdose, intentional, initial encounter (HCC) 09/21/2023   Hypokalemia 09/21/2023   Suicidal behavior with attempted self-injury (HCC) 09/21/2023   Anxiety 09/21/2023   Uncontrolled type 2 diabetes mellitus with hyperglycemia, with long-term current use of insulin  (HCC) 02/02/2022   Hyponatremia 02/02/2022   Obesity (BMI 30-39.9) 01/26/2022   CAP (community acquired pneumonia) 01/26/2022   COPD exacerbation (HCC) 01/26/2022   Depression with anxiety 01/26/2022   HTN (hypertension) 01/26/2022   Episodic migraine 02/23/2021   Elevated BP without diagnosis of hypertension 09/18/2018   Ulnar neuropathy of right upper extremity 08/20/2018   Osteoarthritis of spine with radiculopathy, cervical region 08/20/2018   Gastroesophageal reflux disease 08/06/2018   Elevated hemoglobin A1c 07/30/2018   Mixed hyperlipidemia 07/30/2018   Osteoarthritis, multiple sites 07/19/2016   Major depression, recurrent, chronic (HCC) 06/30/2016   Chronic left-sided low back pain with left-sided sciatica 06/30/2016   Centrilobular emphysema (HCC) 06/30/2016   Obesity (BMI 35.0-39.9 without comorbidity) 06/30/2016   GAD (generalized anxiety disorder)  09/17/2015   Obsessive-compulsive personality trait 09/17/2015   Tobacco use disorder 01/05/2015   Dyspnea 01/05/2015    Past Surgical History:  Procedure Laterality Date   CESAREAN SECTION  11/11/2007   SKIN CANCER EXCISION     back    Current Outpatient Medications  Medication Sig Dispense Refill   ACCU-CHEK GUIDE TEST test strip Use to check blood sugar up to 3 times per day 300 each 3   Accu-Chek Softclix Lancets lancets Check sugar up to 3 times daily 300 each 3   albuterol  (PROVENTIL ) (2.5 MG/3ML) 0.083% nebulizer solution Take 3 mLs (2.5 mg total) by nebulization every 6 (six) hours as needed for shortness of breath or wheezing. 75 mL 0   albuterol  (VENTOLIN  HFA) 108 (90 Base) MCG/ACT inhaler Inhale 2 puffs into the lungs every 6 (six) hours as needed for wheezing or shortness of breath. 8 g 2   Alcohol  Swabs  PADS 1 Dose by Does not apply route 3 (three) times daily before meals. 200 each 0   ALPRAZolam  (XANAX ) 0.25 MG tablet TAKE 1 TABLET(0.25 MG) BY MOUTH TWICE DAILY 60 tablet 0   amLODipine  (NORVASC ) 10 MG tablet Take 1 tablet (10 mg total) by mouth daily. 90 tablet 3   Blood Glucose Calibration (ACCU-CHEK GUIDE CONTROL)  LIQD Use for glucometer 1 each 0   Blood Glucose Monitoring Suppl (ACCU-CHEK GUIDE ME) w/Device KIT Use to check blood sugar up to 3 times per day 1 kit 0   buPROPion  (WELLBUTRIN  XL) 150 MG 24 hr tablet Take 1 tablet (150 mg total) by mouth daily. 90 tablet 1   EMGALITY  120 MG/ML SOAJ Inject 240 mg into the skin once for 1 dose. Loading dose, x 1. Then monthly 120mg . 2 mL 0   gabapentin  (NEURONTIN ) 300 MG capsule TAKE 1 CAPSULE(300 MG) BY MOUTH THREE TIMES DAILY 270 capsule 0   HYDROcodone -acetaminophen  (NORCO/VICODIN) 5-325 MG tablet Take 1 tablet by mouth every 6 (six) hours as needed.     hydrOXYzine  (ATARAX ) 25 MG tablet TAKE 1 TABLET(25 MG) BY MOUTH THREE TIMES DAILY AS NEEDED FOR ANXIETY 270 tablet 0   insulin  glargine (LANTUS  SOLOSTAR) 100 UNIT/ML  Solostar Pen Inject 20 Units into the skin daily. 15 mL 2   Insulin  Pen Needle 33G X 5 MM MISC 1 Dose by Does not apply route 3 (three) times daily before meals. 200 each 0   ondansetron  (ZOFRAN -ODT) 4 MG disintegrating tablet DISSOLVE 1 TABLET(4 MG) ON THE TONGUE EVERY 8 HOURS AS NEEDED FOR NAUSEA OR VOMITING 30 tablet 2   SYMBICORT  160-4.5 MCG/ACT inhaler Inhale 2 puffs into the lungs 2 (two) times daily. 1 each 12   tiZANidine  (ZANAFLEX ) 4 MG tablet Take 1 tablet (4 mg total) by mouth every 8 (eight) hours as needed for muscle spasms. 30 tablet 0   TRULICITY  1.5 MG/0.5ML SOAJ ADMINISTER 1.5 MG UNDER THE SKIN 1 TIME A WEEK 2 mL 2   No current facility-administered medications for this visit.    Allergies as of 03/25/2024 - Review Complete 03/25/2024  Allergen Reaction Noted   Aspirin  Nausea Only 10/26/2018    Vitals: BP 117/75 (Cuff Size: Large)   Pulse 73   Ht 5' 7 (1.702 m)   Wt 237 lb 3.2 oz (107.6 kg)   LMP 08/11/2014   BMI 37.15 kg/m  Last Weight:  Wt Readings from Last 1 Encounters:  03/25/24 237 lb 3.2 oz (107.6 kg)   Last Height:   Ht Readings from Last 1 Encounters:  03/25/24 5' 7 (1.702 m)     Physical exam: Exam: Gen: NAD, conversant, well nourised, obese, well groomed                     CV: RRR, no MRG. No Carotid Bruits. No peripheral edema, warm, nontender Eyes: Conjunctivae clear without exudates or hemorrhage  Neuro: Detailed Neurologic Exam  Speech:    Speech is normal; fluent and spontaneous with normal comprehension.  Cognition:    The patient is oriented to person, place, and time;     recent and remote memory intact;     language fluent;     normal attention, concentration,     fund of knowledge Cranial Nerves:    The pupils are equal, round, and reactive to light.  Visual fields are full to finger confrontation. Extraocular movements are intact. Trigeminal sensation is intact and the muscles of mastication are normal. The face is  symmetric. The palate elevates in the midline. Hearing intact. Voice is normal. Shoulder shrug is normal. The tongue has normal motion without fasciculations.   Coordination: nml  Gait: nml  Motor Observation:    No asymmetry, no atrophy, and no involuntary movements noted. Tone:    Normal muscle tone.    Posture:  Posture is normal. normal erect    Strength:    Strength is V/V in the upper and lower limbs. Slight LE proximal weakness appears to be more due to pain (reports hip pain)     Sensation: intact to LT     Reflex Exam:  DTR's:    Deep tendon reflexes in the upper and lower extremities are symmetrical bilaterally.   Toes:    The toes are equiv bilaterally.   Clonus:    Clonus is absent.    Assessment/Plan:  Patient with chronic migraines with acute exacerbation now daily and intractable  - Worsening headaches since Covid a few months ago, has COPD, has not seen her pulmonologist would recommend seeing her pulmonologist to see if she has hypoxia overnight can cause morning/daily headaches, or get a sleep study with pulmonologist, she never recovered from covid, she is always exhausted, referral to her pulmonologist for sleep testing and she has COPD and she still smokes.   - Been on Emgality  for 6 weeks, may take longer to work, continue on the emgality  for 12 weeks can also switch to Ajovy or layer on botox  on top of the cgrp. Will get an initial botox  approval and schedule to overlap with Emgality .   - MRI of the brain w/wo contrast: MRI brain due to concerning symptoms of worsening hedaches, morning headaches, positional and vision changes and diplopia, persistent intractable headaches to look for space occupying mass, chiari or intracranial hypertension (pseudotumor), strokes, malignancies, vasculidities, demyelination(multiple sclerosis) or other.   - Get eye checkup for vision  - follow up if botox  is approved and we can see if we need to inject botox  of change  to ajovy. Followup in 6 weeks  - check esr/crp and blood work  - morning headaches even before getting out of bed, excessive daytime fatigue could nod off at home even watching TV, unknow if snores, morbid obesity: Sleep evaluation. ESS 12. Never had a sleep study/evaluation.   Orders Placed This Encounter  Procedures   MR BRAIN W WO CONTRAST   Sedimentation rate   C-reactive protein   BUN   Creatinine, Serum   Ambulatory referral to Pulmonology   No orders of the defined types were placed in this encounter.   Cc: Edman Marsa DEWAINE Edman, Marsa PARAS, DO  Onetha Epp, MD  [facility] [address] [place], [place] [address]  Phone [phone] Fax [phone]

## 2024-03-25 NOTE — Telephone Encounter (Signed)
 Chronic Migraine CPT 64615    Botox [id number] Units 155 units   G43.711 Chronic Migraine without aura, intractable, with status migrainous

## 2024-03-25 NOTE — Telephone Encounter (Signed)
 Auth was approved, please send rx to [facility].  Auth#: [id number] (03/25/24-09/21/24)

## 2024-03-25 NOTE — Telephone Encounter (Signed)
 Done

## 2024-03-25 NOTE — Patient Instructions (Addendum)
 -   See pulmonology  - Been on Emgality  for 6 weeks, may take longer to work, continue on the emgality  for 12 weeks can also switch to Ajovy or layer on botox on top of the cgrp. Will get an initial botox approval and schedule to overlap with Emgality .   - MRI of the brain w/wo contrast:   - Get eye checkup for vision  - follow up if botox is approved and we can see if we need to inject botox of change to ajovy.   - check esr/crp and blood work   Discussed:  There is increased risk for stroke in women with migraine with aura and a contraindication for the combined contraceptive pill for use by women who have migraine with aura. The risk for women with migraine without aura is lower. However other risk factors like smoking are far more likely to increase stroke risk than migraine. There is a recommendation for no smoking and for the use of OCPs without estrogen such as progestogen only pills particularly for women with migraine with aura.Aaron Aas People who have migraine headaches with auras may be 3 times more likely to have a stroke caused by a blood clot, compared to migraine patients who don't see auras. Women who take hormone-replacement therapy may be 30 percent more likely to suffer a clot-based stroke than women not taking medication containing estrogen. Other risk factors like smoking and high blood pressure may be  much more important. And stroke is still a rare complication due to migraine aura and is controversial and lower doses may not cause a risk.

## 2024-03-25 NOTE — Addendum Note (Signed)
 Addended by: Viktoria Gray on: 03/25/2024 02:52 PM   Modules accepted: Orders

## 2024-03-25 NOTE — Telephone Encounter (Signed)
 Submitted auth request via CMM, status is pending. Key: [id number] Pt is not eligible for Botox Savings since she has Medicaid plan.

## 2024-03-25 NOTE — Telephone Encounter (Signed)
-----   Message from Glory Larsen sent at 03/25/2024  9:35 AM EDT ----- Regarding: g43.711 Please see if botox can be approved, g43.711 thank you

## 2024-03-26 ENCOUNTER — Other Ambulatory Visit (HOSPITAL_COMMUNITY): Payer: Self-pay

## 2024-03-26 ENCOUNTER — Other Ambulatory Visit: Payer: Self-pay

## 2024-03-26 ENCOUNTER — Telehealth: Payer: Self-pay | Admitting: Neurology

## 2024-03-26 ENCOUNTER — Ambulatory Visit: Payer: Self-pay | Admitting: Neurology

## 2024-03-26 ENCOUNTER — Other Ambulatory Visit

## 2024-03-26 DIAGNOSIS — M25552 Pain in left hip: Secondary | ICD-10-CM | POA: Diagnosis not present

## 2024-03-26 DIAGNOSIS — M24852 Other specific joint derangements of left hip, not elsewhere classified: Secondary | ICD-10-CM | POA: Diagnosis not present

## 2024-03-26 LAB — SEDIMENTATION RATE: Sed Rate: 17 mm/h (ref 0–40)

## 2024-03-26 LAB — CREATININE, SERUM
Creatinine, Ser: 0.95 mg/dL (ref 0.57–1.00)
eGFR: 69 mL/min/{1.73_m2} (ref >59)

## 2024-03-26 LAB — C-REACTIVE PROTEIN: CRP: 17 mg/L — ABNORMAL HIGH (ref 0–10)

## 2024-03-26 LAB — BUN: BUN: 17 mg/dL (ref 6–24)

## 2024-03-26 NOTE — Telephone Encounter (Signed)
 healthy blue Siegfried Dress: [id number] exp. 03/26/24-05/24/24 sent to GI [phone]

## 2024-03-27 ENCOUNTER — Telehealth: Payer: Self-pay | Admitting: Sleep Medicine

## 2024-03-27 NOTE — Telephone Encounter (Signed)
 Patient appointment for 03/28/2024 needs to be rescheduled with Dr. Auston Left.

## 2024-03-28 ENCOUNTER — Ambulatory Visit: Admitting: Sleep Medicine

## 2024-03-29 ENCOUNTER — Other Ambulatory Visit: Payer: Self-pay

## 2024-03-29 ENCOUNTER — Encounter: Payer: Self-pay | Admitting: Emergency Medicine

## 2024-03-29 ENCOUNTER — Emergency Department
Admission: EM | Admit: 2024-03-29 | Discharge: 2024-03-30 | Discharge status: Against Medical Advice | Attending: Emergency Medicine | Admitting: Emergency Medicine

## 2024-03-29 DIAGNOSIS — S0990XA Unspecified injury of head, initial encounter: Secondary | ICD-10-CM | POA: Diagnosis not present

## 2024-03-29 DIAGNOSIS — Z5321 Procedure and treatment not carried out due to patient leaving prior to being seen by health care provider: Secondary | ICD-10-CM | POA: Insufficient documentation

## 2024-03-29 DIAGNOSIS — S0181XA Laceration without foreign body of other part of head, initial encounter: Secondary | ICD-10-CM | POA: Insufficient documentation

## 2024-03-29 DIAGNOSIS — S199XXA Unspecified injury of neck, initial encounter: Secondary | ICD-10-CM | POA: Diagnosis not present

## 2024-03-29 DIAGNOSIS — H538 Other visual disturbances: Secondary | ICD-10-CM | POA: Diagnosis not present

## 2024-03-29 DIAGNOSIS — R609 Edema, unspecified: Secondary | ICD-10-CM | POA: Diagnosis not present

## 2024-03-29 DIAGNOSIS — R58 Hemorrhage, not elsewhere classified: Secondary | ICD-10-CM | POA: Diagnosis not present
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
## 2024-03-30 ENCOUNTER — Emergency Department

## 2024-03-30 DIAGNOSIS — S199XXA Unspecified injury of neck, initial encounter: Secondary | ICD-10-CM | POA: Diagnosis not present

## 2024-03-30 DIAGNOSIS — S0990XA Unspecified injury of head, initial encounter: Secondary | ICD-10-CM | POA: Diagnosis not present

## 2024-03-31 ENCOUNTER — Other Ambulatory Visit: Payer: Self-pay | Admitting: Family Medicine

## 2024-03-31 DIAGNOSIS — F411 Generalized anxiety disorder: Secondary | ICD-10-CM

## 2024-04-02 NOTE — Telephone Encounter (Signed)
 Requested medication (s) are due for refill today: Yes  Requested medication (s) are on the active medication list: Yes  Last refill:  02/14/24  Future visit scheduled: No  Notes to clinic:  Not delegated.    Requested Prescriptions  Pending Prescriptions Disp Refills   ALPRAZolam  (XANAX ) 0.25 MG tablet [Pharmacy Med Name: ALPRAZOLAM  0.25MG  TABLETS] 60 tablet     Sig: TAKE 1 TABLET(0.25 MG) BY MOUTH TWICE DAILY     Not Delegated - Psychiatry: Anxiolytics/Hypnotics 2 Failed - 04/02/2024  2:50 PM      Failed - This refill cannot be delegated      Failed - Valid encounter within last 6 months    Recent Outpatient Visits           1 month ago Episodic migraine   [place] [facility] [place], Marsa PARAS, DO   2 months ago Hordeolum externum of right upper eyelid   [facility] Edman Marsa PARAS, DO   2 months ago Episodic migraine   [place] [facility] Edman Marsa PARAS, DO   3 months ago Episodic migraine   [facility] Edman Marsa PARAS, DO       Future Appointments             In 2 weeks Darliss, Redell, MD [facility] - Urine Drug Screen completed in last 360 days      Passed - Patient is not pregnant

## 2024-04-04 ENCOUNTER — Encounter: Payer: Self-pay | Admitting: Family Medicine

## 2024-04-04 ENCOUNTER — Telehealth: Admitting: Family Medicine

## 2024-04-04 ENCOUNTER — Other Ambulatory Visit

## 2024-04-04 DIAGNOSIS — R110 Nausea: Secondary | ICD-10-CM

## 2024-04-04 DIAGNOSIS — G8929 Other chronic pain: Secondary | ICD-10-CM

## 2024-04-04 DIAGNOSIS — J432 Centrilobular emphysema: Secondary | ICD-10-CM | POA: Diagnosis not present

## 2024-04-04 DIAGNOSIS — Z7985 Long-term (current) use of injectable non-insulin antidiabetic drugs: Secondary | ICD-10-CM

## 2024-04-04 DIAGNOSIS — E1165 Type 2 diabetes mellitus with hyperglycemia: Secondary | ICD-10-CM

## 2024-04-04 DIAGNOSIS — M5442 Lumbago with sciatica, left side: Secondary | ICD-10-CM | POA: Diagnosis not present

## 2024-04-04 DIAGNOSIS — Z794 Long term (current) use of insulin: Secondary | ICD-10-CM | POA: Diagnosis not present

## 2024-04-04 DIAGNOSIS — M5030 Other cervical disc degeneration, unspecified cervical region: Secondary | ICD-10-CM | POA: Diagnosis not present

## 2024-04-04 DIAGNOSIS — G43909 Migraine, unspecified, not intractable, without status migrainosus: Secondary | ICD-10-CM

## 2024-04-04 DIAGNOSIS — U099 Post covid-19 condition, unspecified: Secondary | ICD-10-CM

## 2024-04-04 MED ORDER — ALBUTEROL SULFATE HFA 108 (90 BASE) MCG/ACT IN AERS: 2.0000 | INHALATION_SPRAY | Freq: Four times a day (QID) | RESPIRATORY_TRACT | 3 refills | Status: DC | PRN

## 2024-04-04 NOTE — Progress Notes (Signed)
 Subjective:    Patient ID: Christy Mayer, female    DOB: 1966-02-06, 58 y.o.   MRN: [id number]  Christy Mayer is a 58 y.o. female presenting on 04/04/2024 for Migraine and Diabetes   Virtual / Telehealth Encounter - Video Visit via MyChart The purpose of this virtual visit is to provide medical care while limiting exposure to the novel coronavirus (COVID19) for both patient and office staff.  Consent was obtained for remote visit:  Yes.   Answered questions that patient had about telehealth interaction:  Yes.   I discussed the limitations, risks, security and privacy concerns of performing an evaluation and management service by video/telephone. I also discussed with the patient that there may be a patient responsible charge related to this service. The patient expressed understanding and agreed to proceed.  Patient Location: Home Provider Location: [facility] (Office)  Participants in virtual visit: - Patient: Christy Mayer - CMA: Alan Fontana CMA - Provider: Dr Edman   HPI  Discussed the use of AI scribe software for clinical note transcription with the patient, who gave verbal consent to proceed.  History of Present Illness   Christy Mayer is a 58 year old female who presents for continuation of disability paperwork due to chronic migraines.  Episodic Migraine Vertigo / Nausea Chronic Neck and Back Pain / Degenerative Disc Disease  History of COVID-19 with complications  Episodic vs Chronic migraines  She experiences ongoing migraines that have not been relieved by her current medication regimen. She has tried multiple treatments including Nurtec, Ubrelvy , sumatriptan , and rizatriptan , but none have provided significant relief. Nurtec has had a slightly better effect than other medications but does not alleviate her blurred or double vision associated with the migraines. - She has also tried preventative therapy with Gabapentin  and Duloxetine   in past  - Chronic headaches daily with flares of migraines significantly impair ability to work still, she remains out on disability - Migraines present upon awakening and worsen throughout the day - Established now with Neurology in [place] - Currently undergoing treatment with Emgality ; has received one dose and is scheduled for a second dose - Scheduled for Botox treatment on May 02, 2024 as part of migraine management -MRI  Brain scan scheduled for tomorrow to further evaluate condition  Possible OSA / Sleep-related symptoms - Wakes up with migraines that worsen throughout the day - Neurology has already sent Referred to pulmonology for further evaluation - Discussion regarding possible sleep study to assess for underlying sleep disorder or breathing link to cause her headaches.  Type 2 Diabetes Improving control On Lantus  and Trulicity      01/25/2024    9:33 AM 12/15/2023   11:08 AM 10/06/2023   11:00 AM  Depression screen PHQ 2/9  Decreased Interest 2 0 1  Down, Depressed, Hopeless 0 0 0  PHQ - 2 Score 2 0 1  Altered sleeping 0  3  Tired, decreased energy 2  3  Change in appetite 0  3  Feeling bad or failure about yourself  0  0  Trouble concentrating 0  3  Moving slowly or fidgety/restless 0  0  Suicidal thoughts 0  0  PHQ-9 Score 4  13  Difficult doing work/chores Very difficult         01/25/2024    9:34 AM 10/06/2023   11:00 AM 02/15/2022    1:21 PM 09/18/2018    1:17 PM  GAD 7 : Generalized Anxiety Score  Nervous,  Anxious, on Edge 0 3 1 3   Control/stop worrying 0 0 0 1  Worry too much - different things 0 1 0 2  Trouble relaxing 0 0 0 3  Restless 0 0 0 2  Easily annoyed or irritable 0 2 0 1  Afraid - awful might happen 0 0 0 2  Total GAD 7 Score 0 6 1 14   Anxiety Difficulty Not difficult at all  Not difficult at all Somewhat difficult    Social History   Tobacco Use   Smoking status: Every Day    Current packs/day: 0.50    Average packs/day: 0.5  packs/day for 30.0 years (15.0 ttl pk-yrs)    Types: Cigarettes    Passive exposure: Never   Smokeless tobacco: Never  Vaping Use   Vaping status: Never Used  Substance Use Topics   Alcohol  use: Yes    Alcohol /week: 0.0 standard drinks of alcohol     Comment: occasionally    Drug use: No    Review of Systems Per HPI unless specifically indicated above     Objective:    LMP 08/11/2014   Wt Readings from Last 3 Encounters:  03/25/24 237 lb 3.2 oz (107.6 kg)  01/25/24 240 lb 8 oz (109.1 kg)  12/15/23 238 lb 11.2 oz (108.3 kg)     Physical Exam  Note examination was completely remotely via video observation objective data only  Gen - well-appearing, uncomfortable with headache HEENT - eyes appear clear without discharge or redness. Noted ecchymosis left forearm near periorbital Heart/Lungs - cannot examine virtually - observed no evidence of coughing or labored breathing. Abd - cannot examine virtually  Skin - face visible today- no rash Neuro - awake, alert, oriented Psych - not anxious appearing   Results for orders placed or performed in visit on 03/25/24  Sedimentation rate   Collection Time: 03/25/24  9:08 AM  Result Value Ref Range   Sed Rate 17 0 - 40 mm/hr  C-reactive protein   Collection Time: 03/25/24  9:08 AM  Result Value Ref Range   CRP 17 (H) 0 - 10 mg/L  BUN   Collection Time: 03/25/24  9:08 AM  Result Value Ref Range   BUN 17 6 - 24 mg/dL  Creatinine, Serum   Collection Time: 03/25/24  9:08 AM  Result Value Ref Range   Creatinine, Ser 0.95 0.57 - 1.00 mg/dL   eGFR 69 >40 fO/fpw/8.26      Assessment & Plan:   Problem List Items Addressed This Visit     Centrilobular emphysema (HCC)   Relevant Medications   albuterol  (VENTOLIN  HFA) 108 (90 Base) MCG/ACT inhaler   Chronic left-sided low back pain with left-sided sciatica   Episodic migraine - Primary   Uncontrolled type 2 diabetes mellitus with hyperglycemia, with long-term current use of  insulin  (HCC)   Other Visit Diagnoses       DDD (degenerative disc disease), cervical         Nausea         Long-term current use of injectable noninsulin antidiabetic medication         Long COVID           Episodic Migraine with Chronic Headaches migraines with blurred and double vision, >4-14 headache days per month  Inadequately controlled Nausea vomiting Neck pain / chronic   Previous treatments ineffective. Neurology consulting with patient now since July 2025 She has upcoming MRI Brain scheduled for tomorrow 04/05/24  Followed by [facility]  Dr Ines She is currently on Emgality  loading dose and also Botox therapy for migraine headaches Tried failed - Gabapentin , Duloxetine  for prevention and Sumatriptan , Rizatriptan , Nurtec, Ubrelvy  for abortive  Also has been referred to Pulmonologist now for possible Obstructive Sleep Apnea / question if breathing is contributing to morning headache and triggering migraines  History of COVID-19 in February 2025. Concern for Long COVID symptoms affected following COVID-19 illness   Diabetes Mellitus Type 2 Elevated A1c and glucose readings, now improving On Trulicity  1.5mg  weekly, On Lantus  20u daily  Short Term Disability updates  [employer] for the Short Term Disability Claim [phone] She remains unable to work due to Episodic Migraine Headaches, Nausea, Vertigo, and Chronic Neck and Back Pain  Complete paperwork and to be faxed to [employer] for disability continuation  - Consult pulmonologist for potential breathing issues. - Consider sleep study for sleep-related migraine causes.  Asthma Asthma managed with albuterol  inhaler. Requires refill approval from [facility]. - Send prescription refill for albuterol  inhaler to [facility].         No orders of the defined types were placed in this encounter.   Meds ordered this encounter  Medications   albuterol  (VENTOLIN  HFA) 108 (90 Base)  MCG/ACT inhaler    Sig: Inhale 2 puffs into the lungs every 6 (six) hours as needed for wheezing or shortness of breath.    Dispense:  8 g    Refill:  3    Follow up plan: Return in about 3 months (around 07/05/2024).   Patient verbalizes understanding with the above medical recommendations including the limitation of remote medical advice.  Specific follow-up and call-back criteria were given for patient to follow-up or seek medical care more urgently if needed.  Total duration of direct patient care provided via video conference: 10 minutes   Marsa Officer, DO [facility] 04/04/2024, 12:09 PM

## 2024-04-04 NOTE — Patient Instructions (Signed)
   Please schedule a Follow-up Appointment to: No follow-ups on file.  If you have any other questions or concerns, please feel free to call the office or send a message through MyChart. You may also schedule an earlier appointment if necessary.  Additionally, you may be receiving a survey about your experience at our office within a few days to 1 week by e-mail or mail. We value your feedback.  Saralyn Pilar, DO [facility], [place]

## 2024-04-05 ENCOUNTER — Ambulatory Visit
Admission: RE | Admit: 2024-04-05 | Discharge: 2024-04-05 | Disposition: A | Discharge status: Home / Self Care | Source: Ambulatory Visit | Attending: Neurology | Admitting: Neurology

## 2024-04-05 DIAGNOSIS — R519 Headache, unspecified: Secondary | ICD-10-CM

## 2024-04-05 DIAGNOSIS — H532 Diplopia: Secondary | ICD-10-CM

## 2024-04-05 DIAGNOSIS — G4719 Other hypersomnia: Secondary | ICD-10-CM

## 2024-04-05 DIAGNOSIS — R51 Headache with orthostatic component, not elsewhere classified: Secondary | ICD-10-CM

## 2024-04-05 DIAGNOSIS — H539 Unspecified visual disturbance: Secondary | ICD-10-CM

## 2024-04-05 DIAGNOSIS — R9082 White matter disease, unspecified: Secondary | ICD-10-CM | POA: Diagnosis not present

## 2024-04-05 DIAGNOSIS — G8929 Other chronic pain: Secondary | ICD-10-CM

## 2024-04-05 MED ORDER — GADOPICLENOL 0.5 MMOL/ML IV SOLN
7.5000 mL | Freq: Once | INTRAVENOUS | Status: AC | PRN
  Administered 2024-04-05: 7.5 mL via INTRAVENOUS

## 2024-04-08 ENCOUNTER — Other Ambulatory Visit (HOSPITAL_COMMUNITY): Payer: Self-pay

## 2024-04-08 ENCOUNTER — Other Ambulatory Visit: Payer: Self-pay

## 2024-04-08 NOTE — Progress Notes (Signed)
 Specialty Pharmacy Initial Fill Coordination Note  Christy Mayer is a 58 y.o. female contacted today regarding initial fill of specialty medication(s) OnabotulinumtoxinA (Botox)   Patient requested Courier to Provider Office   Delivery date: 04/23/24   Verified address: [facility], [address], [place], [place] [address]   Medication will be filled on 04/19/2024.   Patient is aware of 4.00 copayment.

## 2024-04-15 ENCOUNTER — Encounter: Payer: Self-pay | Admitting: Family Medicine

## 2024-04-16 MED ORDER — INSULIN PEN NEEDLE 33G X 5 MM MISC: 1.0000 | Freq: Three times a day (TID) | 3 refills | Status: DC

## 2024-04-19 ENCOUNTER — Ambulatory Visit: Attending: Cardiology | Admitting: Cardiology

## 2024-04-19 ENCOUNTER — Other Ambulatory Visit: Payer: Self-pay

## 2024-04-19 ENCOUNTER — Encounter: Payer: Self-pay | Admitting: Cardiology

## 2024-04-19 ENCOUNTER — Ambulatory Visit

## 2024-04-19 ENCOUNTER — Other Ambulatory Visit (HOSPITAL_COMMUNITY): Payer: Self-pay

## 2024-04-19 VITALS — BP 126/66 | HR 69 | Ht 67.0 in | Wt 244.0 lb

## 2024-04-19 DIAGNOSIS — I10 Essential (primary) hypertension: Secondary | ICD-10-CM | POA: Insufficient documentation

## 2024-04-19 DIAGNOSIS — R002 Palpitations: Secondary | ICD-10-CM

## 2024-04-19 DIAGNOSIS — E782 Mixed hyperlipidemia: Secondary | ICD-10-CM | POA: Diagnosis not present

## 2024-04-19 DIAGNOSIS — F17200 Nicotine dependence, unspecified, uncomplicated: Secondary | ICD-10-CM | POA: Insufficient documentation

## 2024-04-19 MED ORDER — CLOPIDOGREL BISULFATE 75 MG PO TABS
75.0000 mg | ORAL_TABLET | Freq: Every day | ORAL | 3 refills | Status: AC
Start: 2024-04-19 — End: ?

## 2024-04-19 NOTE — Patient Instructions (Signed)
 Medication Instructions:  Your physician recommends the following medication changes.  START TAKING: Plavix  75 mg by mouth daily  *If you need a refill on your cardiac medications before your next appointment, please call your pharmacy*  Lab Work: Your provider would like for you to have following labs drawn today lipid.     Testing/Procedures: Your physician has recommended that you wear a 14 day Zio monitor.   This monitor is a medical device that records the heart's electrical activity. Doctors most often use these monitors to diagnose arrhythmias. Arrhythmias are problems with the speed or rhythm of the heartbeat. The monitor is a small device applied to your chest. You can wear one while you do your normal daily activities. While wearing this monitor if you have any symptoms to push the button and record what you felt. Once you have worn this monitor for the period of time provider prescribed (Usually 14 days), you will return the monitor device in the postage paid box. Once it is returned they will download the data collected and provide us  with a report which the provider will then review and we will call you with those results. Important tips:  Avoid showering during the first 24 hours of wearing the monitor. Avoid excessive sweating to help maximize wear time. Do not submerge the device, no hot tubs, and no swimming pools. Keep any lotions or oils away from the patch. After 24 hours you may shower with the patch on. Take brief showers with your back facing the shower head.  Do not remove patch once it has been placed because that will interrupt data and decrease adhesive wear time. Push the button when you have any symptoms and write down what you were feeling. Once you have completed wearing your monitor, remove and place into box which has postage paid and place in your outgoing mailbox.  If for some reason you have misplaced your box then call our office and we can provide  another box and/or mail it off for you.     Follow-Up: At [facility], you and your health needs are our priority.  As part of our continuing mission to provide you with exceptional heart care, our providers are all part of one team.  This team includes your primary Cardiologist (physician) and Advanced Practice Providers or APPs (Physician Assistants and Nurse Practitioners) who all work together to provide you with the care you need, when you need it.  Your next appointment:   6-8 week(s)  Provider:   You may see Dr. Darliss or one of the following Advanced Practice Providers on your designated Care Team:   Lonni Meager, NP Lesley Maffucci, PA-C Bernardino Bring, PA-C Cadence Doland, PA-C Tylene Lunch, NP Barnie Hila, NP

## 2024-04-19 NOTE — Progress Notes (Signed)
 Cardiology Office Note:    Date:  04/19/2024   ID:  Christy Mayer, DOB [birth year], MRN [id number]  PCP:  Edman Marsa PARAS, DO   [facility] Providers Cardiologist:  None     Referring MD: Edman Marsa *   Chief Complaint  Patient presents with   Establish Care    New pt has complaints of palpations off and on since FEB,  no chest pain, chest pressure,  SOB which could be from COPD , medciation reviewed verbally with patient    History of Present Illness:    Christy Mayer is a 58 y.o. female with a hx of hypertension, diabetes, current smoker x 30+ years, COPD, migraine, who presents due to palpitations.  States having symptoms of palpitations ongoing several years.  Symptoms occur almost daily lasting a few minutes.  Denies syncope.  Denies chest pain or shortness of breath.  She still smokes, follows up/has an appointment with pulmonary later this month.  Has ongoing headaches, etiology unknown.  Diagnosed with migraine, also sees neurology.  Gets nauseous whenever she takes aspirin .   Echocardiogram 09/2023 EF 65 to 70%, normal diastolic function.  Past Medical History:  Diagnosis Date   Arthritis    Asthma    Back pain    COPD (chronic obstructive pulmonary disease) (HCC)    COPD (chronic obstructive pulmonary disease) (HCC)    Emphysema lung (HCC)    Heart palpitations    Miscarriage    Mitral valve prolapse     Past Surgical History:  Procedure Laterality Date   CESAREAN SECTION  11/11/2007   SKIN CANCER EXCISION     back    Current Medications: Current Meds  Medication Sig   ACCU-CHEK GUIDE TEST test strip Use to check blood sugar up to 3 times per day   Accu-Chek Softclix Lancets lancets Check sugar up to 3 times daily   albuterol  (PROVENTIL ) (2.5 MG/3ML) 0.083% nebulizer solution Take 3 mLs (2.5 mg total) by nebulization every 6 (six) hours as needed for shortness of breath or wheezing.   albuterol  (VENTOLIN  HFA) 108 (90  Base) MCG/ACT inhaler Inhale 2 puffs into the lungs every 6 (six) hours as needed for wheezing or shortness of breath.   Alcohol  Swabs  PADS 1 Dose by Does not apply route 3 (three) times daily before meals.   ALPRAZolam  (XANAX ) 0.25 MG tablet TAKE 1 TABLET(0.25 MG) BY MOUTH TWICE DAILY   amLODipine  (NORVASC ) 10 MG tablet Take 1 tablet (10 mg total) by mouth daily.   Blood Glucose Calibration (ACCU-CHEK GUIDE CONTROL) LIQD Use for glucometer   Blood Glucose Monitoring Suppl (ACCU-CHEK GUIDE ME) w/Device KIT Use to check blood sugar up to 3 times per day   botulinum toxin Type A  (BOTOX ) 200 units injection Inject 155 units into head and neck muscles every 3 months.  Discard remainder.   buPROPion  (WELLBUTRIN  XL) 150 MG 24 hr tablet Take 1 tablet (150 mg total) by mouth daily.   clopidogrel  (PLAVIX ) 75 MG tablet Take 1 tablet (75 mg total) by mouth daily.   EMGALITY  120 MG/ML SOAJ Inject 240 mg into the skin once for 1 dose. Loading dose, x 1. Then monthly 120mg .   gabapentin  (NEURONTIN ) 300 MG capsule TAKE 1 CAPSULE(300 MG) BY MOUTH THREE TIMES DAILY   HYDROcodone -acetaminophen  (NORCO/VICODIN) 5-325 MG tablet Take 1 tablet by mouth every 6 (six) hours as needed.   insulin  glargine (LANTUS  SOLOSTAR) 100 UNIT/ML Solostar Pen Inject 20 Units into the skin daily.  Insulin  Pen Needle 33G X 5 MM MISC 1 Dose by Does not apply route 3 (three) times daily before meals.   ondansetron  (ZOFRAN -ODT) 4 MG disintegrating tablet DISSOLVE 1 TABLET(4 MG) ON THE TONGUE EVERY 8 HOURS AS NEEDED FOR NAUSEA OR VOMITING   SYMBICORT  160-4.5 MCG/ACT inhaler Inhale 2 puffs into the lungs 2 (two) times daily.   TRULICITY  1.5 MG/0.5ML SOAJ ADMINISTER 1.5 MG UNDER THE SKIN 1 TIME A WEEK     Allergies:   Aspirin    Social History   Socioeconomic History   Marital status: Married    Spouse name: Not on file   Number of children: Not on file   Years of education: Not on file   Highest education level: Some college, no  degree  Occupational History   Occupation: work from home  Tobacco Use   Smoking status: Every Day    Current packs/day: 0.50    Average packs/day: 0.5 packs/day for 30.0 years (15.0 ttl pk-yrs)    Types: Cigarettes    Passive exposure: Never   Smokeless tobacco: Never  Vaping Use   Vaping status: Never Used  Substance and Sexual Activity   Alcohol  use: Yes    Alcohol /week: 0.0 standard drinks of alcohol     Comment: occasionally    Drug use: No   Sexual activity: Yes    Birth control/protection: None  Other Topics Concern   Not on file  Social History Narrative   Caffiene 2 cups in am coffee   Lives home husband, grown kids, cats, dogs gecke and chickens   Working: STD disability from migraines (pcp).     Social Drivers of Corporate investment banker Strain: Low Risk  (03/26/2024)   Received from [facility]   Overall Financial Resource Strain (CARDIA)    Difficulty of Paying Living Expenses: Not very hard  Food Insecurity: Food Insecurity Present (03/26/2024)   Received from [facility]   Hunger Vital Sign    Within the past 12 months, you worried that your food would run out before you got the money to buy more.: Sometimes true    Within the past 12 months, the food you bought just didn't last and you didn't have money to get more.: Sometimes true  Transportation Needs: No Transportation Needs (03/26/2024)   Received from [facility] - Transportation    In the past 12 months, has lack of transportation kept you from medical appointments or from getting medications?: No    Lack of Transportation (Non-Medical): No  Physical Activity: Unknown (12/15/2023)   Exercise Vital Sign    Days of Exercise per Week: 0 days    Minutes of Exercise per Session: Not on file  Stress: No Stress Concern Present (12/15/2023)   [employer] of Occupational Health - Occupational Stress Questionnaire    Feeling of Stress : Only a  little  Social Connections: Moderately Isolated (12/15/2023)   Social Connection and Isolation Panel    Frequency of Communication with Friends and Family: More than three times a week    Frequency of Social Gatherings with Friends and Family: Once a week    Attends Religious Services: Never    Database administrator or Organizations: No    Attends Engineer, structural: Not on file    Marital Status: Married     Family History: The patient's family history is not on file. She was adopted.  ROS:   Please see  the history of present illness.     All other systems reviewed and are negative.  EKGs/Labs/Other Studies Reviewed:    The following studies were reviewed today:  EKG Interpretation Date/Time:  Friday April 19 2024 09:01:24 EDT Ventricular Rate:  69 PR Interval:  158 QRS Duration:  82 QT Interval:  414 QTC Calculation: 443 R Axis:   9  Text Interpretation: Normal sinus rhythm Normal ECG Confirmed by Darliss Rogue ([id number]) on 04/19/2024 9:02:47 AM    Recent Labs: 09/21/2023: ALT 26 09/22/2023: Hemoglobin 12.7; Magnesium  2.2; Platelets 189; Potassium 3.9; Sodium 134 03/25/2024: BUN 17; Creatinine, Ser 0.95  Recent Lipid Panel    Component Value Date/Time   CHOL 258 (H) 07/30/2018 1106   TRIG 254 (H) 07/30/2018 1106   HDL 44 (L) 07/30/2018 1106   CHOLHDL 5.9 (H) 07/30/2018 1106   VLDL 44 (H) 04/17/2011 0530   LDLCALC 172 (H) 07/30/2018 1106     Risk Assessment/Calculations:             Physical Exam:    VS:  BP 126/66 (BP Location: Left Arm, Patient Position: Sitting, Cuff Size: Normal)   Pulse 69   Ht 5' 7 (1.702 m)   Wt 244 lb (110.7 kg)   LMP 08/11/2014   SpO2 98%   BMI 38.22 kg/m     Wt Readings from Last 3 Encounters:  04/19/24 244 lb (110.7 kg)  03/25/24 237 lb 3.2 oz (107.6 kg)  01/25/24 240 lb 8 oz (109.1 kg)     GEN:  Well nourished, well developed in no acute distress HEENT: Normal NECK: No JVD; No carotid bruits CARDIAC:  RRR, no murmurs, rubs, gallops RESPIRATORY: Diminished breath sounds, no wheezing. ABDOMEN: Soft, non-tender, non-distended MUSCULOSKELETAL:  No edema; No deformity  SKIN: Warm and dry NEUROLOGIC:  Alert and oriented x 3 PSYCHIATRIC:  Normal affect   ASSESSMENT:    1. Palpitations   2. Primary hypertension   3. Mixed hyperlipidemia   4. Current smoker    PLAN:    In order of problems listed above:  Palpitations, placed cardiac monitor to evaluate any significant arrhythmias. Hypertension, BP controlled.  Continue Norvasc  10 mg daily. Hyperlipidemia, obtain fasting lipid profile.  Start statin if cholesterol not controlled. Current smoker, smoking cessation advised.  Start Plavix  due to several risk factors for stroke and heart attack including smoking, hypertension, hyperlipidemia, diabetes.  Apparently has nausea with taking aspirin .  Follow-up in 6 weeks      Medication Adjustments/Labs and Tests Ordered: Current medicines are reviewed at length with the patient today.  Concerns regarding medicines are outlined above.  Orders Placed This Encounter  Procedures   Lipid panel   LONG TERM MONITOR (3-14 DAYS)   EKG 12-Lead   Meds ordered this encounter  Medications   clopidogrel  (PLAVIX ) 75 MG tablet    Sig: Take 1 tablet (75 mg total) by mouth daily.    Dispense:  90 tablet    Refill:  3    Patient Instructions  Medication Instructions:  Your physician recommends the following medication changes.  START TAKING: Plavix  75 mg by mouth daily  *If you need a refill on your cardiac medications before your next appointment, please call your pharmacy*  Lab Work: Your provider would like for you to have following labs drawn today lipid.     Testing/Procedures: Your physician has recommended that you wear a 14 day Zio monitor.   This monitor is a medical device that records the heart's electrical  activity. Doctors most often use these monitors to diagnose arrhythmias.  Arrhythmias are problems with the speed or rhythm of the heartbeat. The monitor is a small device applied to your chest. You can wear one while you do your normal daily activities. While wearing this monitor if you have any symptoms to push the button and record what you felt. Once you have worn this monitor for the period of time provider prescribed (Usually 14 days), you will return the monitor device in the postage paid box. Once it is returned they will download the data collected and provide us  with a report which the provider will then review and we will call you with those results. Important tips:  Avoid showering during the first 24 hours of wearing the monitor. Avoid excessive sweating to help maximize wear time. Do not submerge the device, no hot tubs, and no swimming pools. Keep any lotions or oils away from the patch. After 24 hours you may shower with the patch on. Take brief showers with your back facing the shower head.  Do not remove patch once it has been placed because that will interrupt data and decrease adhesive wear time. Push the button when you have any symptoms and write down what you were feeling. Once you have completed wearing your monitor, remove and place into box which has postage paid and place in your outgoing mailbox.  If for some reason you have misplaced your box then call our office and we can provide another box and/or mail it off for you.     Follow-Up: At [facility], you and your health needs are our priority.  As part of our continuing mission to provide you with exceptional heart care, our providers are all part of one team.  This team includes your primary Cardiologist (physician) and Advanced Practice Providers or APPs (Physician Assistants and Nurse Practitioners) who all work together to provide you with the care you need, when you need it.  Your next appointment:   6-8 week(s)  Provider:   You may see Dr. Darliss or one of the  following Advanced Practice Providers on your designated Care Team:   Lonni Meager, NP Lesley Maffucci, PA-C Bernardino Bring, PA-C Cadence Franchester, PA-C Tylene Lunch, NP Barnie Hila, NP       Signed, Redell Darliss, MD  04/19/2024 9:37 AM    [facility]

## 2024-04-20 ENCOUNTER — Ambulatory Visit: Payer: Self-pay | Admitting: Cardiology

## 2024-04-20 LAB — LIPID PANEL
Chol/HDL Ratio: 6.2 ratio — ABNORMAL HIGH (ref 0.0–4.4)
Cholesterol, Total: 260 mg/dL — ABNORMAL HIGH (ref 100–199)
HDL: 42 mg/dL (ref >39)
LDL Chol Calc (NIH): 176 mg/dL — ABNORMAL HIGH (ref 0–99)
Triglycerides: 222 mg/dL — ABNORMAL HIGH (ref 0–149)
VLDL Cholesterol Cal: 42 mg/dL — ABNORMAL HIGH (ref 5–40)

## 2024-04-22 ENCOUNTER — Encounter: Payer: Self-pay | Admitting: Family Medicine

## 2024-04-22 ENCOUNTER — Encounter: Payer: Self-pay | Admitting: Neurology

## 2024-04-22 MED ORDER — ROSUVASTATIN CALCIUM 20 MG PO TABS: 20.0000 mg | ORAL_TABLET | Freq: Every day | ORAL | 3 refills | Status: AC

## 2024-04-22 NOTE — Telephone Encounter (Signed)
 Dr. Edman has not followed up on her diabetes since 09/2023 and her A1c was 9% at that time.  I think it would be better if she schedules an office visit with him to have labs done and discuss chronic conditions.

## 2024-05-01 ENCOUNTER — Other Ambulatory Visit: Payer: Self-pay | Admitting: Internal Medicine

## 2024-05-01 DIAGNOSIS — F411 Generalized anxiety disorder: Secondary | ICD-10-CM

## 2024-05-02 ENCOUNTER — Ambulatory Visit: Payer: Self-pay | Admitting: Neurology

## 2024-05-02 ENCOUNTER — Telehealth: Payer: Self-pay | Admitting: Neurology

## 2024-05-02 NOTE — Telephone Encounter (Signed)
 LVM and sent mychart msg informing pt of appt time change

## 2024-05-03 NOTE — Telephone Encounter (Signed)
 Requested medications are due for refill today.  yes  Requested medications are on the active medications list.  yes  Last refill. 04/03/2024 #60 0 rf  Future visit scheduled.   yes  Notes to clinic.  Refill not delegated.    Requested Prescriptions  Pending Prescriptions Disp Refills   ALPRAZolam  (XANAX ) 0.25 MG tablet [Pharmacy Med Name: ALPRAZOLAM  0.25MG  TABLETS] 60 tablet     Sig: TAKE 1 TABLET(0.25 MG) BY MOUTH TWICE DAILY     Not Delegated - Psychiatry: Anxiolytics/Hypnotics 2 Failed - 05/03/2024 10:58 AM      Failed - This refill cannot be delegated      Passed - Urine Drug Screen completed in last 360 days      Passed - Patient is not pregnant      Passed - Valid encounter within last 6 months    Recent Outpatient Visits           4 weeks ago Episodic migraine   [place] [facility] [place], Marsa PARAS, DO   2 months ago Episodic migraine   [place] [facility] [place], Marsa PARAS, DO   3 months ago Hordeolum externum of right upper eyelid   [place] [facility] [place], Marsa PARAS, DO   3 months ago Episodic migraine   [place] [facility] [place] Marsa PARAS, DO   4 months ago Episodic migraine   [place] [facility] [place] Marsa PARAS, DO       Future Appointments             In 1 month Agbor-Etang, Redell, MD [facility]

## 2024-05-04 ENCOUNTER — Other Ambulatory Visit: Payer: Self-pay | Admitting: Family Medicine

## 2024-05-04 DIAGNOSIS — G43909 Migraine, unspecified, not intractable, without status migrainosus: Secondary | ICD-10-CM

## 2024-05-06 NOTE — Telephone Encounter (Signed)
 Requested medication (s) are due for refill today: yes  Requested medication (s) are on the active medication list: yes  Last refill:  03/08/24  Future visit scheduled: no  Notes to clinic:   Medication not assigned to a protocol, review manually.      Requested Prescriptions  Pending Prescriptions Disp Refills   EMGALITY  120 MG/ML SOAJ [Pharmacy Med Name: EMGALITY  120MG /ML AUTO INJECTOR 1ML] 2 mL 0    Sig: INJECT 240 MG UNDER THE SKIN ONCE FOR 1 DOSE.     Off-Protocol Failed - 05/06/2024  3:42 PM      Failed - Medication not assigned to a protocol, review manually.      Passed - Valid encounter within last 12 months    Recent Outpatient Visits           1 month ago Episodic migraine   [place] [facility] [place], Marsa PARAS, DO   2 months ago Episodic migraine   [place] [facility] [place], Marsa PARAS, DO   3 months ago Hordeolum externum of right upper eyelid   [place] [facility] [place] Marsa PARAS, DO   3 months ago Episodic migraine   [place] [facility] [place] Marsa PARAS, DO   4 months ago Episodic migraine   [place] [facility] [place] Marsa PARAS, DO       Future Appointments             In 1 month Agbor-Etang, Redell, MD [facility]

## 2024-05-07 ENCOUNTER — Ambulatory Visit: Admitting: Neurology

## 2024-05-08 ENCOUNTER — Ambulatory Visit: Admitting: Neurology

## 2024-05-08 VITALS — BP 118/75 | HR 84

## 2024-05-08 DIAGNOSIS — G43711 Chronic migraine without aura, intractable, with status migrainosus: Secondary | ICD-10-CM | POA: Diagnosis not present

## 2024-05-08 MED ORDER — ONABOTULINUMTOXINA 200 UNITS IJ SOLR
155.0000 [IU] | Freq: Once | INTRAMUSCULAR | Status: AC
Start: 2024-05-08 — End: ?

## 2024-05-08 NOTE — Progress Notes (Signed)
 Botox - 200 units x 1 vial Lot: [id number] Expiration: 07/2026 NDC: 9976-6078-97  Bacteriostatic 0.9% Sodium Chloride - 4 mL  Lot: [id number] Expiration: 07/2025 NDC: 9590-8033-97  Dx: G43.711 S/P  Witnessed by Particia PEAK

## 2024-05-08 NOTE — Progress Notes (Signed)
Consent Form Botulism Toxin Injection For Chronic Migraine   First botox  Reviewed orally with patient, additionally signature is on file:  Botulism toxin has been approved by the Federal drug administration for treatment of chronic migraine. Botulism toxin does not cure chronic migraine and it may not be effective in some patients.  The administration of botulism toxin is accomplished by injecting a small amount of toxin into the muscles of the neck and head. Dosage must be titrated for each individual. Any benefits resulting from botulism toxin tend to wear off after 3 months with a repeat injection required if benefit is to be maintained. Injections are usually done every 3-4 months with maximum effect peak achieved by about 2 or 3 weeks. Botulism toxin is expensive and you should be sure of what costs you will incur resulting from the injection.  The side effects of botulism toxin use for chronic migraine may include:   -Transient, and usually mild, facial weakness with facial injections  -Transient, and usually mild, head or neck weakness with head/neck injections  -Reduction or loss of forehead facial animation due to forehead muscle weakness  -Eyelid drooping  -Dry eye  -Pain at the site of injection or bruising at the site of injection  -Double vision  -Potential unknown long term risks  Contraindications: You should not have Botox if you are pregnant, nursing, allergic to albumin, have an infection, skin condition, or muscle weakness at the site of the injection, or have myasthenia gravis, Lambert-Eaton syndrome, or ALS.  It is also possible that as with any injection, there may be an allergic reaction or no effect from the medication. Reduced effectiveness after repeated injections is sometimes seen and rarely infection at the injection site may occur. All care will be taken to prevent these side effects. If therapy is given over a long time, atrophy and wasting in the muscle  injected may occur. Occasionally the patient's become refractory to treatment because they develop antibodies to the toxin. In this event, therapy needs to be modified.  I have read the above information and consent to the administration of botulism toxin.    BOTOX PROCEDURE NOTE FOR MIGRAINE HEADACHE    Contraindications and precautions discussed with patient(above). Aseptic procedure was observed and patient tolerated procedure. Procedure performed by Dr. Toni Jareli [place]  The condition has existed for more than 6 months, and pt does not have a diagnosis of ALS, Myasthenia Gravis or Lambert-Eaton Syndrome.  Risks and benefits of injections discussed and pt agrees to proceed with the procedure.  Written consent obtained  These injections are medically necessary. Pt  receives good benefits from these injections. These injections do not cause sedations or hallucinations which the oral therapies may cause.  Description of procedure:  The patient was placed in a sitting position. The standard protocol was used for Botox as follows, with 5 units of Botox injected at each site:   -Procerus muscle, midline injection  -Corrugator muscle, bilateral injection  -Frontalis muscle, bilateral injection, with 2 sites each side, medial injection was performed in the upper one third of the frontalis muscle, in the region vertical from the medial inferior edge of the superior orbital rim. The lateral injection was again in the upper one third of the forehead vertically above the lateral limbus of the cornea, 1.5 cm lateral to the medial injection site.  -Temporalis muscle injection, 4 sites, bilaterally. The first injection was 3 cm above the tragus of the ear, second injection site was 1.5 cm   to 3 cm up from the first injection site in line with the tragus of the ear. The third injection site was 1.5-3 cm forward between the first 2 injection sites. The fourth injection site was 1.5 cm posterior to the second  injection site.   -Occipitalis muscle injection, 3 sites, bilaterally. The first injection was done one half way between the occipital protuberance and the tip of the mastoid process behind the ear. The second injection site was done lateral and superior to the first, 1 fingerbreadth from the first injection. The third injection site was 1 fingerbreadth superiorly and medially from the first injection site.  -Cervical paraspinal muscle injection, 2 sites, bilateral knee first injection site was 1 cm from the midline of the cervical spine, 3 cm inferior to the lower border of the occipital protuberance. The second injection site was 1.5 cm superiorly and laterally to the first injection site.  -Trapezius muscle injection was performed at 3 sites, bilaterally. The first injection site was in the upper trapezius muscle halfway between the inflection point of the neck, and the acromion. The second injection site was one half way between the acromion and the first injection site. The third injection was done between the first injection site and the inflection point of the neck.   Will return for repeat injection in 3 months.   200 units of Botox was used, any Botox not injected was wasted. The patient tolerated the procedure well, there were no complications of the above procedure.   

## 2024-05-08 NOTE — Addendum Note (Signed)
 Addended by: Charniece Venturino B on: 05/08/2024 03:30 PM   Modules accepted: Orders

## 2024-05-13 ENCOUNTER — Ambulatory Visit: Admitting: Family Medicine

## 2024-05-13 ENCOUNTER — Encounter: Payer: Self-pay | Admitting: Family Medicine

## 2024-05-13 VITALS — BP 120/80 | HR 69 | Ht 67.0 in | Wt 239.4 lb

## 2024-05-13 DIAGNOSIS — S61452A Open bite of left hand, initial encounter: Secondary | ICD-10-CM

## 2024-05-13 DIAGNOSIS — Z7985 Long-term (current) use of injectable non-insulin antidiabetic drugs: Secondary | ICD-10-CM

## 2024-05-13 DIAGNOSIS — J432 Centrilobular emphysema: Secondary | ICD-10-CM

## 2024-05-13 DIAGNOSIS — G8929 Other chronic pain: Secondary | ICD-10-CM | POA: Diagnosis not present

## 2024-05-13 DIAGNOSIS — G43719 Chronic migraine without aura, intractable, without status migrainosus: Secondary | ICD-10-CM | POA: Diagnosis not present

## 2024-05-13 DIAGNOSIS — M4722 Other spondylosis with radiculopathy, cervical region: Secondary | ICD-10-CM

## 2024-05-13 DIAGNOSIS — M5030 Other cervical disc degeneration, unspecified cervical region: Secondary | ICD-10-CM | POA: Diagnosis not present

## 2024-05-13 DIAGNOSIS — W5501XA Bitten by cat, initial encounter: Secondary | ICD-10-CM

## 2024-05-13 DIAGNOSIS — L089 Local infection of the skin and subcutaneous tissue, unspecified: Secondary | ICD-10-CM

## 2024-05-13 DIAGNOSIS — M5442 Lumbago with sciatica, left side: Secondary | ICD-10-CM

## 2024-05-13 DIAGNOSIS — E1169 Type 2 diabetes mellitus with other specified complication: Secondary | ICD-10-CM | POA: Diagnosis not present

## 2024-05-13 LAB — POCT GLYCOSYLATED HEMOGLOBIN (HGB A1C): Hemoglobin A1C: 7.8 % — AB (ref 4.0–5.6)

## 2024-05-13 MED ORDER — AMOXICILLIN-POT CLAVULANATE 875-125 MG PO TABS: 1.0000 | ORAL_TABLET | Freq: Two times a day (BID) | ORAL | 0 refills | Status: DC

## 2024-05-13 NOTE — Patient Instructions (Addendum)
 Thank you for coming to the office today.  Cat Puncture bite wound L index Start Augmentin  twice a day for up to 14 days, 10 minimum  We will fax a copy of the A1c result to Dr Dodson   If you need to dose increase on the Trulicity  from 1.5 up to 3.0mg , please let me know we can adjust the dose if you see higher sugar ranges.  Recent Labs    09/21/23 0610 05/13/24 1458  HGBA1C 9.0* 7.8*   Okay to proceed with ESI injections  Keep with Dr Ines on the botox  injections and hopefully some progress on migraines.  Anticipate disability / long term disability paperwork   Please schedule a Follow-up Appointment to: Return if symptoms worsen or fail to improve.  If you have any other questions or concerns, please feel free to call the office or send a message through MyChart. You may also schedule an earlier appointment if necessary.  Additionally, you may be receiving a survey about your experience at our office within a few days to 1 week by e-mail or mail. We value your feedback.  Marsa Officer, DO [facility], [place]

## 2024-05-13 NOTE — Progress Notes (Signed)
 Subjective:    Patient ID: AILIE GAGE, female    DOB: 1966/05/29, 58 y.o.   MRN: [id number]  JENNICE RENEGAR is a 58 y.o. female presenting on 05/13/2024 for Medical Management of Chronic Issues, Diabetes, Back Pain (n), and Neck Pain   HPI  Discussed the use of AI scribe software for clinical note transcription with the patient, who gave verbal consent to proceed.  History of Present Illness   SIOBHAN ZARO is a 58 year old female with diabetes who presents for follow-up on blood sugar control and management of a cat bite.  Type 2 Diabetes - Recent hemoglobin A1c is 7.8, improved from previous 9 - Diabetes managed with Trulicity  1.5 mg weekly and Lantus  insulin  - Blood glucose generally stable between 150-200 mg/dL - Blood glucose rises to 250-299 mg/dL towards end of week before next Trulicity  dose - Regular self-monitoring of blood glucose  Left hand cat bite - Cat bite to left index finger one week ago - Puncture wounds with initial swelling, now indurated and localized. But had improved. No drainage.  Area remains painful - No fever associated with the bite - Rabies vaccinations are up to date on her pet cats  Chronic Migraine headaches and associated symptoms Followed by Dr Ines [facility] Neuro - Chronic migraines, recently received first Botox  treatment last week - Headaches have been severe since Botox  treatment - Weather changes may contribute to headache severity - Associated vertigo present - Regular use of anti-nausea medication for migraine-related symptoms - Tried and failed multiple oral medications for migraine prevention.  Nurtec, Ubrelvy , sumatriptan , and rizatriptan , Duloxetine , Gabapentin   Chronic Neck and Back Pain Cervical Lumbar DDD / Discogenic pain Followed by Dr Marcelline Gins Physiatry ESI upcoming now that A1c is controlled - Recent back injury from lifting a laundry basket - Significant pain resulting in cancellation of physical therapy  appointment - Physical therapy rescheduled for August 20th, on waiting list for earlier slot - Plans to focus on neck during therapy due to possible link with migraines  Followed by Cardiology Cardiac evaluation and hyperlipidemia - Undergoing cardiac evaluation, completed two-week heart monitor yesterday - On blood thinners and cholesterol medication for hyperlipidemia Cardiologist on blood thinner, completed ZIO patch  History of COVID-19 with complications Post-covid symptoms - Intermittent fevers every 1-2 weeks since COVID infection, resolving after a night of sweating - Persistent tinnitus since COVID, described as very annoying - Uses television as distraction from tinnitus     Vertigo / Nausea Chronic Neck and Back Pain / Degenerative Disc Disease   Possible OSA / Sleep-related symptoms - Wakes up with migraines that worsen throughout the day Upcoming Pulmonology management - Discussion regarding possible sleep study to assess for underlying sleep disorder or breathing link to cause her headaches.        05/13/2024    2:55 PM 01/25/2024    9:33 AM 12/15/2023   11:08 AM  Depression screen PHQ 2/9  Decreased Interest 0 2 0  Down, Depressed, Hopeless 0 0 0  PHQ - 2 Score 0 2 0  Altered sleeping 0 0   Tired, decreased energy 3 2   Change in appetite 0 0   Feeling bad or failure about yourself  0 0   Trouble concentrating 0 0   Moving slowly or fidgety/restless 0 0   Suicidal thoughts 0 0   PHQ-9 Score 3 4   Difficult doing work/chores  Very difficult        05/13/2024  2:56 PM 01/25/2024    9:34 AM 10/06/2023   11:00 AM 02/15/2022    1:21 PM  GAD 7 : Generalized Anxiety Score  Nervous, Anxious, on Edge 0 0 3 1  Control/stop worrying 0 0 0 0  Worry too much - different things 0 0 1 0  Trouble relaxing 0 0 0 0  Restless 0 0 0 0  Easily annoyed or irritable 0 0 2 0  Afraid - awful might happen 0 0 0 0  Total GAD 7 Score 0 0 6 1  Anxiety Difficulty Not difficult  at all Not difficult at all  Not difficult at all    Social History   Tobacco Use   Smoking status: Every Day    Current packs/day: 0.50    Average packs/day: 0.5 packs/day for 30.0 years (15.0 ttl pk-yrs)    Types: Cigarettes    Passive exposure: Never   Smokeless tobacco: Never  Vaping Use   Vaping status: Never Used  Substance Use Topics   Alcohol  use: Yes    Alcohol /week: 0.0 standard drinks of alcohol     Comment: occasionally    Drug use: No    Review of Systems Per HPI unless specifically indicated above     Objective:    BP 120/80 (BP Location: Right Arm, Patient Position: Sitting, Cuff Size: Normal)   Pulse 69   Ht 5' 7 (1.702 m)   Wt 239 lb 6 oz (108.6 kg)   LMP 08/11/2014   SpO2 96%   BMI 37.49 kg/m   Wt Readings from Last 3 Encounters:  05/13/24 239 lb 6 oz (108.6 kg)  04/19/24 244 lb (110.7 kg)  03/25/24 237 lb 3.2 oz (107.6 kg)    Physical Exam Vitals and nursing note reviewed.  Constitutional:      General: She is not in acute distress.    Appearance: She is well-developed. She is not diaphoretic.     Comments: Well-appearing, comfortable, cooperative  HENT:     Head: Normocephalic and atraumatic.  Eyes:     General:        Right eye: No discharge.        Left eye: No discharge.     Conjunctiva/sclera: Conjunctivae normal.  Neck:     Thyroid: No thyromegaly.     Comments: Reduced range of motion neck. Muscle hypertonicity Cardiovascular:     Rate and Rhythm: Normal rate and regular rhythm.     Heart sounds: Normal heart sounds. No murmur heard. Pulmonary:     Effort: Pulmonary effort is normal. No respiratory distress.     Breath sounds: Normal breath sounds. No wheezing or rales.  Musculoskeletal:     Comments: Reduced range of motion back. Muscle hypertonicity  Lymphadenopathy:     Cervical: No cervical adenopathy.  Skin:    General: Skin is warm and dry.     Findings: Erythema (left index dorsal PIP region of finger with erythema  induration) present. No rash.  Neurological:     Mental Status: She is alert and oriented to person, place, and time.  Psychiatric:        Behavior: Behavior normal.     Comments: Well groomed, good eye contact, normal speech and thoughts     Left Hand Index Finger redness, induration   Recent Labs    09/21/23 0610 05/13/24 1458  HGBA1C 9.0* 7.8*     Results for orders placed or performed in visit on 05/13/24  POCT HgB A1C   Collection  Time: 05/13/24  2:58 PM  Result Value Ref Range   Hemoglobin A1C 7.8 (A) 4.0 - 5.6 %   HbA1c POC (<> result, manual entry)     HbA1c, POC (prediabetic range)     HbA1c, POC (controlled diabetic range)        Assessment & Plan:   Problem List Items Addressed This Visit     Centrilobular emphysema (HCC)   Chronic left-sided low back pain with left-sided sciatica   Osteoarthritis of spine with radiculopathy, cervical region   Type 2 diabetes mellitus with other specified complication (HCC) - Primary   Relevant Orders   POCT HgB A1C (Completed)   Other Visit Diagnoses       Cat bite of left hand including fingers with infection, initial encounter       Relevant Medications   amoxicillin -clavulanate (AUGMENTIN ) 875-125 MG tablet     DDD (degenerative disc disease), cervical         Intractable chronic migraine without aura and without status migrainosus         Long-term current use of injectable noninsulin antidiabetic medication             Cat bite with localized infection, left index finger Localized infection with induration, tenderness, and warmth. No fever. Rabies not a concern due to vaccinated house pet. Note Tdap is updated 2017 - Prescribed Augmentin  for 10-14 days, minimum 10 days, continue 2-3 days post-symptom resolution. - Monitor symptom resolution, adjust treatment duration as needed.  Chronic pain, neck and low back Chronic neck and low back pain exacerbated by lifting injury. Planned epidural steroid  injections. - Proceed with epidural steroid injections once A1c is acceptable. - Continue physical therapy focusing on neck, plan to address lower back and hip.  Type 2 diabetes mellitus Improved glycemic control with A1c at 7.8. Trulicity  and Lantus  in use, Trulicity  less effective at week's end. - Fax A1c result to [facility] Dr. Dodson for permission to pursue epidural steroid injection. - Monitor blood glucose closely, especially with upcoming injection. - Consider increasing Trulicity  to 3.0 mg if glucose levels rise. She will notify office or follow up  Chronic Migraine Followed by [facility] Neurology Dr Ines Failed multiple medications and injections Chronic migraines with recent Botox  initiation. No significant improvement yet, may take 2-3 treatments. - Continue Botox  treatment. - Attend rescheduled physical therapy on August 20th. - Consider sleep study for sleep apnea evaluation.  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 - Manage with environmental noise. - Monitor symptom changes.      FAX TO ATTN Dr Marcelline Dodson [facility] [address] [place], [place] [address] Fax [phone]  Fax A1c result to Dr Dodson - [facility]  Awaiting Disability Paperwork from [place]. She states that Short Term Disability runs out 05/16/24. She will likely need new documentation for Long Term Disability, awaiting paperwork now.  Orders Placed This Encounter  Procedures   POCT HgB A1C    Meds ordered this encounter  Medications   amoxicillin -clavulanate (AUGMENTIN ) 875-125 MG tablet    Sig: Take 1 tablet by mouth 2 (two) times daily. For 14 days    Dispense:  28 tablet    Refill:  0    Follow up plan: Return if symptoms worsen or fail to improve.    Marsa Officer, DO [facility] 05/13/2024, 2:58 PM

## 2024-05-15 ENCOUNTER — Other Ambulatory Visit (HOSPITAL_COMMUNITY): Payer: Self-pay

## 2024-05-16 ENCOUNTER — Other Ambulatory Visit (HOSPITAL_COMMUNITY): Payer: Self-pay

## 2024-05-20 DIAGNOSIS — R002 Palpitations: Secondary | ICD-10-CM

## 2024-05-21 MED ORDER — METOPROLOL SUCCINATE ER 25 MG PO TB24
25.0000 mg | ORAL_TABLET | Freq: Every day | ORAL | 3 refills | Status: DC
Start: 2024-05-21 — End: 2024-06-14

## 2024-05-24 ENCOUNTER — Encounter: Payer: Self-pay | Admitting: Internal Medicine

## 2024-05-24 ENCOUNTER — Other Ambulatory Visit: Payer: Self-pay | Admitting: Family Medicine

## 2024-05-24 ENCOUNTER — Ambulatory Visit (INDEPENDENT_AMBULATORY_CARE_PROVIDER_SITE_OTHER): Admitting: Internal Medicine

## 2024-05-24 VITALS — BP 130/80 | HR 75 | Temp 98.3°F | Ht 67.0 in | Wt 240.2 lb

## 2024-05-24 DIAGNOSIS — R0609 Other forms of dyspnea: Secondary | ICD-10-CM | POA: Diagnosis not present

## 2024-05-24 DIAGNOSIS — J4489 Other specified chronic obstructive pulmonary disease: Secondary | ICD-10-CM | POA: Diagnosis not present

## 2024-05-24 DIAGNOSIS — G4733 Obstructive sleep apnea (adult) (pediatric): Secondary | ICD-10-CM

## 2024-05-24 DIAGNOSIS — F17210 Nicotine dependence, cigarettes, uncomplicated: Secondary | ICD-10-CM | POA: Diagnosis not present

## 2024-05-24 DIAGNOSIS — G4710 Hypersomnia, unspecified: Secondary | ICD-10-CM

## 2024-05-24 DIAGNOSIS — G4734 Idiopathic sleep related nonobstructive alveolar hypoventilation: Secondary | ICD-10-CM

## 2024-05-24 DIAGNOSIS — R110 Nausea: Secondary | ICD-10-CM

## 2024-05-24 MED ORDER — TRELEGY ELLIPTA 200-62.5-25 MCG/ACT IN AEPB: 1.0000 | INHALATION_SPRAY | Freq: Every day | RESPIRATORY_TRACT | Status: AC

## 2024-05-24 NOTE — Patient Instructions (Addendum)
   Recommend obtaining home sleep study to assess for sleep apnea Recommend obtaining pulmonary function testing to assess for COPD Recommend obtaining CT chest previous history of pneumonia Start Trelegy 200  Please stop smoking!!!

## 2024-05-24 NOTE — Progress Notes (Signed)
 [facility] [place] [facility] Consultation      Date: 05/24/2024,   MRN# [id number] Christy Mayer July 07, 1966     CHIEF COMPLAINT:   Assessment of COPD Assessment of OSA   HISTORY OF PRESENT ILLNESS   Patient is seen today for problems and issues with sleep related to excessive daytime sleepiness Patient  has been having sleep problems for many years Patient has been having excessive daytime sleepiness for a long time Patient has been having extreme fatigue and tiredness, lack of energy +  very Loud snoring every night Patient also having history of migraines Neurology assessment requiring assessing for OSA  Discussed sleep data and reviewed with patient.  Encouraged proper weight management.  Discussed driving precautions and its relationship with hypersomnolence.  Discussed operating dangerous equipment and its relationship with hypersomnolence.  Discussed sleep hygiene, and benefits of a fixed sleep waked time.  The importance of getting eight or more hours of sleep discussed with patient.  Discussed limiting the use of the computer and television before bedtime.  Decrease naps during the day, so night time sleep will become enhanced.  Limit caffeine , and sleep deprivation.  HTN, stroke, and heart failure are potential risk factors.   Discussed risk of untreated sleep apnea including cardiac arrhthymias, stroke, DM, pulm HTN.      05/24/2024    8:00 AM  Results of the Epworth flowsheet  Sitting and reading 3  Watching TV 3  Sitting, inactive in a public place (e.g. a theatre or a meeting) 1  As a passenger in a car for an hour without a break 2  Lying down to rest in the afternoon when circumstances permit 3  Sitting and talking to someone 0  Sitting quietly after a lunch without alcohol  3  In a car, while stopped for a few minutes in traffic 0  Total score 15     Assessment COPD Diagnosed in 2017 Patient with significant pneumonia 2023 CT chest reviewed  below Multifocal opacities consistent with infectious etiology Patient active smoker half pack a day for 40 years Patient currently on Symbicort  using albuterol  inhaler daily nebulizers as needed Plan to start Trelegy No exacerbation at this time No evidence of heart failure at this time No evidence or signs of infection at this time No respiratory distress No fevers, chills, nausea, vomiting, diarrhea No evidence of lower extremity edema No evidence hemoptysis        Patient with a history of tachycardia sees cardiology Likely related to smoking history as well as probable underlying sleep apnea   PAST MEDICAL HISTORY   Past Medical History:  Diagnosis Date   Arthritis    Asthma    Back pain    COPD (chronic obstructive pulmonary disease) (HCC)    COPD (chronic obstructive pulmonary disease) (HCC)    Emphysema lung (HCC)    Heart palpitations    Miscarriage    Mitral valve prolapse      SURGICAL HISTORY   Past Surgical History:  Procedure Laterality Date   CESAREAN SECTION  11/11/2007   SKIN CANCER EXCISION     back     FAMILY HISTORY   Family History  Adopted: Yes     SOCIAL HISTORY   Social History   Tobacco Use   Smoking status: Every Day    Current packs/day: 0.50    Average packs/day: 0.5 packs/day for 30.0 years (15.0 ttl pk-yrs)    Types: Cigarettes    Passive exposure: Never   Smokeless  tobacco: Never  Vaping Use   Vaping status: Never Used  Substance Use Topics   Alcohol  use: Yes    Alcohol /week: 0.0 standard drinks of alcohol     Comment: occasionally    Drug use: No     MEDICATIONS    Home Medication:  Current Outpatient Rx   Order #: [id number] Class: Normal   Order #: [id number] Class: Normal   Order #: [id number] Class: Normal   Order #: [id number] Class: Normal   Order #: [id number] Class: Normal   Order #: [id number] Class: Normal   Order #: [id number] Class: Normal   Order #: [id number] Class: Normal   Order #: [id number] Class:  Normal   Order #: [id number] Class: Normal   Order #: [id number] Class: Normal   Order #: [id number] Class: Normal   Order #: [id number] Class: Normal   Order #: [id number] Class: Normal   Order #: [id number] Class: Normal   Order #: [id number] Class: Historical Med   Order #: [id number] Class: Normal   Order #: [id number] Class: Normal   Order #: [id number] Class: Normal   Order #: [id number] Class: Normal   Order #: [id number] Class: Normal   Order #: [id number] Class: Normal   Order #: [id number] Class: Normal   Order #: [id number] Class: Normal   Order #: [id number] Class: Normal    Current Medication:  Current Outpatient Medications:    ACCU-CHEK GUIDE TEST test strip, Use to check blood sugar up to 3 times per day, Disp: 300 each, Rfl: 3   Accu-Chek Softclix Lancets lancets, Check sugar up to 3 times daily, Disp: 300 each, Rfl: 3   albuterol  (PROVENTIL ) (2.5 MG/3ML) 0.083% nebulizer solution, Take 3 mLs (2.5 mg total) by nebulization every 6 (six) hours as needed for shortness of breath or wheezing., Disp: 75 mL, Rfl: 0   albuterol  (VENTOLIN  HFA) 108 (90 Base) MCG/ACT inhaler, Inhale 2 puffs into the lungs every 6 (six) hours as needed for wheezing or shortness of breath., Disp: 8 g, Rfl: 3   Alcohol  Swabs  PADS, 1 Dose by Does not apply route 3 (three) times daily before meals., Disp: 200 each, Rfl: 0   ALPRAZolam  (XANAX ) 0.25 MG tablet, TAKE 1 TABLET(0.25 MG) BY MOUTH TWICE DAILY, Disp: 60 tablet, Rfl: 0   amLODipine  (NORVASC ) 10 MG tablet, Take 1 tablet (10 mg total) by mouth daily., Disp: 90 tablet, Rfl: 3   amoxicillin -clavulanate (AUGMENTIN ) 875-125 MG tablet, Take 1 tablet by mouth 2 (two) times daily. For 14 days, Disp: 28 tablet, Rfl: 0   Blood Glucose Calibration (ACCU-CHEK GUIDE CONTROL) LIQD, Use for glucometer, Disp: 1 each, Rfl: 0   Blood Glucose Monitoring Suppl (ACCU-CHEK GUIDE ME) w/Device KIT, Use to check blood sugar up to 3 times per day, Disp: 1 kit, Rfl: 0   botulinum toxin Type A  (BOTOX )  200 units injection, Inject 155 units into head and neck muscles every 3 months.  Discard remainder., Disp: 1 each, Rfl: 3   buPROPion  (WELLBUTRIN  XL) 150 MG 24 hr tablet, Take 1 tablet (150 mg total) by mouth daily., Disp: 90 tablet, Rfl: 1   clopidogrel  (PLAVIX ) 75 MG tablet, Take 1 tablet (75 mg total) by mouth daily., Disp: 90 tablet, Rfl: 3   EMGALITY  120 MG/ML SOAJ, INJECT 240 MG UNDER THE SKIN ONCE FOR 1 DOSE., Disp: 2 mL, Rfl: 0   gabapentin  (NEURONTIN ) 300 MG capsule, TAKE 1 CAPSULE(300 MG) BY MOUTH THREE TIMES DAILY, Disp: 270 capsule, Rfl: 0   HYDROcodone -acetaminophen  (NORCO/VICODIN) 5-325 MG tablet, Take 1 tablet by mouth every 6 (six) hours as needed. (Patient not taking: Reported on 05/13/2024), Disp: , Rfl:  hydrOXYzine  (ATARAX ) 25 MG tablet, TAKE 1 TABLET(25 MG) BY MOUTH THREE TIMES DAILY AS NEEDED FOR ANXIETY (Patient taking differently: 3 (three) times daily.), Disp: 270 tablet, Rfl: 0   insulin  glargine (LANTUS  SOLOSTAR) 100 UNIT/ML Solostar Pen, Inject 20 Units into the skin daily., Disp: 15 mL, Rfl: 2   Insulin  Pen Needle 33G X 5 MM MISC, 1 Dose by Does not apply route 3 (three) times daily before meals., Disp: 200 each, Rfl: 3   metoprolol  succinate (TOPROL -XL) 25 MG 24 hr tablet, Take 1 tablet (25 mg total) by mouth daily. Take with or immediately following a meal., Disp: 90 tablet, Rfl: 3   ondansetron  (ZOFRAN -ODT) 4 MG disintegrating tablet, DISSOLVE 1 TABLET(4 MG) ON THE TONGUE EVERY 8 HOURS AS NEEDED FOR NAUSEA OR VOMITING, Disp: 30 tablet, Rfl: 2   rosuvastatin  (CRESTOR ) 20 MG tablet, Take 1 tablet (20 mg total) by mouth daily., Disp: 90 tablet, Rfl: 3   SYMBICORT  160-4.5 MCG/ACT inhaler, Inhale 2 puffs into the lungs 2 (two) times daily., Disp: 1 each, Rfl: 12   tiZANidine  (ZANAFLEX ) 4 MG tablet, Take 1 tablet (4 mg total) by mouth every 8 (eight) hours as needed for muscle spasms. (Patient not taking: Reported on 05/08/2024), Disp: 30 tablet, Rfl: 0   TRULICITY  1.5  MG/0.5ML SOAJ, ADMINISTER 1.5 MG UNDER THE SKIN 1 TIME A WEEK, Disp: 2 mL, Rfl: 2  Current Facility-Administered Medications:    botulinum toxin Type A  (BOTOX ) injection 155 Units, 155 Units, Intramuscular, Once, Ines Paul B, MD    ALLERGIES   Aspirin    LMP 08/11/2014   BP 130/80 (BP Location: Right Arm, Patient Position: Sitting, Cuff Size: Large)   Pulse 75   Temp 98.3 F (36.8 C) (Oral)   Ht 5' 7 (1.702 m)   Wt 240 lb 3.2 oz (109 kg)   LMP 08/11/2014   SpO2 96%   BMI 37.62 kg/m    Review of Systems: Gen:  Denies  fever, sweats, chills weight loss  HEENT: Denies blurred vision, double vision, ear pain, eye pain, hearing loss, nose bleeds, sore throat Cardiac:  No dizziness, chest pain or heaviness, chest tightness,edema, No JVD Resp:   No cough, -sputum production, -shortness of breath,-wheezing, -hemoptysis,  Other:  All other systems negative   Physical Examination:   General Appearance: No distress  EYES PERRLA, EOM intact.   NECK Supple, No JVD Pulmonary: normal breath sounds, No wheezing.  CardiovascularNormal S1,S2.  No m/r/g.   Abdomen: Benign, Soft, non-tender. Neurology UE/LE 5/5 strength, no focal deficits Ext pulses intact, cap refill intact ALL OTHER ROS ARE NEGATIVE      IMAGING    LONG TERM MONITOR (3-14 DAYS) Result Date: 05/20/2024 Patch Wear Time:  13 days and 23 hours (2025-07-20T17:16:39-0400 to 2025-08-03T17:16:31-0400) Patient had a min HR of 46 bpm, max HR of 169 bpm, and avg HR of 75 bpm. Predominant underlying rhythm was Sinus Rhythm. 1 run of Ventricular Tachycardia occurred lasting 5 beats with a max rate of 136 bpm (avg 121 bpm). 10 Supraventricular Tachycardia runs occurred, the run with the fastest interval lasting 4 beats with a max rate of 169 bpm, the longest lasting 6 beats with an avg rate of 135 bpm. Supraventricular Tachycardia was detected within +/- 45 seconds of symptomatic patient event(s). Isolated SVEs were rare  (<1.0%), SVE Couplets were rare (<1.0%), and SVE Triplets were rare (<1.0%). Isolated VEs were rare (<1.0%, 10306), VE Couplets were rare (<1.0%, 101), and VE Triplets were rare (<1.0%,  2). Ventricular Bigeminy and Trigeminy were present. Conclusion Average heart rate 75, range 46-169. 1 run of nonsustained VT lasting 5 beats. 10 episodes of nonsustained SVT.  Associated with patient triggered events. No atrial fibrillation or atrial flutter. No sustained arrhythmias.     ASSESSMENT/PLAN   58 year old pleasant white female seen today for assessment of emphysema and COPD with active smoking and significant smoking history with signs symptoms of fatigue migraines excessive daytime sleepiness probable underlying OSA in the setting of obesity and deconditioned state  Assessment of COPD Recommend obtaining pulmonary function testing Symbicort  is not helping we will try Trelegy inhaler 200 Rinse mouth after use Albuterol  as needed Nebulizers as needed Avoid Allergens and Irritants Avoid secondhand smoke Avoid SICK contacts Recommend  Masking  when appropriate Recommend Keep up-to-date with vaccinations No exacerbation at this time No evidence of heart failure at this time No evidence or signs of infection at this time No respiratory distress No fevers, chills, nausea, vomiting, diarrhea No evidence of lower extremity edema No evidence hemoptysis   Assessment of OSA Recommend home sleep study  Smoking Assessment and Cessation Counseling Upon further questioning, Patient smokes 1 ppd I have advised patient to quit/stop smoking as soon as possible due to high risk for multiple medical problems Patient is NOT willing to quit smoking I have advised patient that we can assist and have options of Nicotine  replacement therapy. I also advised patient on behavioral therapy and can provide oral medication therapy in conjunction with the other therapies Follow up next Office visit  for assessment  of smoking cessation Smoking cessation counseling advised for 4 minutes   Obesity -recommend significant weight loss -recommend changing diet  Deconditioned state -Recommend increased daily activity and exercise   Abnormal CT chest opacifications and CT scan 2023 Recommend repeat CT chest to assess for unresolving pneumonia and malignancy   MEDICATION ADJUSTMENTS/LABS AND TESTS ORDERED: PFT CT chest HST Trelegy 200    CURRENT MEDICATIONS REVIEWED AT LENGTH WITH PATIENT TODAY   Patient  satisfied with Plan of action and management. All questions answered   Follow up 3 months   I spent a total of 78 minutes dedicated to the care of this patient on the date of this encounter to include pre-visit review of records, face-to-face time with the patient discussing conditions above, post visit ordering of testing, clinical documentation with the electronic health record, making appropriate referrals as documented, and communicating necessary information to the patient's healthcare team.    The Patient requires high complexity decision making for assessment and support, frequent evaluation and titration of therapies, application of advanced monitoring technologies and extensive interpretation of multiple databases.  Patient satisfied with Plan of action and management. All questions answered    Nickolas Alm Cellar, M.D.  [facility]  Medical Director [facility] Medical Director [facility] Cardio-Pulmonary Department

## 2024-05-27 ENCOUNTER — Other Ambulatory Visit: Payer: Self-pay

## 2024-05-27 DIAGNOSIS — R110 Nausea: Secondary | ICD-10-CM

## 2024-05-27 MED ORDER — ONDANSETRON 4 MG PO TBDP: 4.0000 mg | ORAL_TABLET | Freq: Three times a day (TID) | ORAL | 2 refills | Status: DC | PRN

## 2024-05-28 NOTE — Telephone Encounter (Signed)
 Duplicate request, LRF 05/27/24.E-Prescribing Status: Receipt confirmed by pharmacy (05/27/2024  8:48 AM EDT).  Requested Prescriptions  Pending Prescriptions Disp Refills   ondansetron  (ZOFRAN -ODT) 4 MG disintegrating tablet [Pharmacy Med Name: ONDANSETRON  ODT 4MG  TABLETS] 30 tablet 2    Sig: DISSOLVE 1 TABLET(4 MG) ON THE TONGUE EVERY 8 HOURS AS NEEDED FOR NAUSEA OR VOMITING     Not Delegated - Gastroenterology: Antiemetics - ondansetron  Failed - 05/28/2024  8:30 AM      Failed - This refill cannot be delegated      Passed - AST in normal range and within 360 days    AST  Date Value Ref Range Status  09/21/2023 17 15 - 41 U/L Final   SGOT(AST)  Date Value Ref Range Status  01/07/2015 24 U/L Final    Comment:    15-41 NOTE: New Reference Range  12/16/14          Passed - ALT in normal range and within 360 days    ALT  Date Value Ref Range Status  09/21/2023 26 0 - 44 U/L Final   SGPT (ALT)  Date Value Ref Range Status  01/07/2015 21 U/L Final    Comment:    14-54 NOTE: New Reference Range  12/16/14          Passed - Valid encounter within last 6 months    Recent Outpatient Visits           2 weeks ago Type 2 diabetes mellitus with other specified complication, without long-term current use of insulin  [facility])   [place] [facility] [place] [facility]a PARAS, DO   1 month ago Episodic migraine   [place] [facility] [place] [facility]a PARAS, DO   3 months ago Episodic migraine   [place] [facility] [place], Marsa PARAS, DO   3 months ago Hordeolum externum of right upper eyelid   [place] [facility] [place] Marsa PARAS, DO   4 months ago Episodic migraine   [place] [facility] [place] Marsa PARAS, DO       Future Appointments             In 2 weeks Agbor-Etang, Redell, MD [facility] [place]   In 4 months Ines Onetha NOVAK, MD [facility]

## 2024-05-29 DIAGNOSIS — M542 Cervicalgia: Secondary | ICD-10-CM | POA: Diagnosis not present

## 2024-05-30 ENCOUNTER — Ambulatory Visit: Admitting: Internal Medicine

## 2024-05-30 DIAGNOSIS — H5213 Myopia, bilateral: Secondary | ICD-10-CM | POA: Diagnosis not present

## 2024-05-30 DIAGNOSIS — H5203 Hypermetropia, bilateral: Secondary | ICD-10-CM | POA: Diagnosis not present

## 2024-05-31 DIAGNOSIS — E1169 Type 2 diabetes mellitus with other specified complication: Secondary | ICD-10-CM | POA: Diagnosis not present

## 2024-05-31 DIAGNOSIS — M5416 Radiculopathy, lumbar region: Secondary | ICD-10-CM | POA: Diagnosis not present

## 2024-06-04 ENCOUNTER — Ambulatory Visit

## 2024-06-04 ENCOUNTER — Other Ambulatory Visit: Payer: Self-pay | Admitting: Family Medicine

## 2024-06-04 DIAGNOSIS — F411 Generalized anxiety disorder: Secondary | ICD-10-CM

## 2024-06-05 ENCOUNTER — Encounter

## 2024-06-06 NOTE — Telephone Encounter (Signed)
 Requested Prescriptions  Pending Prescriptions Disp Refills   hydrOXYzine  (ATARAX ) 25 MG tablet [Pharmacy Med Name: HYDROXYZINE  HCL 25MG  TABS] 270 tablet 1    Sig: TAKE 1 TABLET(25 MG) BY MOUTH THREE TIMES DAILY AS NEEDED FOR ANXIETY     Ear, Nose, and Throat:  Antihistamines 2 Passed - 06/06/2024 10:35 AM      Passed - Cr in normal range and within 360 days    Creat  Date Value Ref Range Status  07/30/2018 0.89 0.50 - 1.05 mg/dL Final    Comment:    For patients >46 years of age, the reference limit for Creatinine is approximately 13% higher for people identified as African-American. .    Creatinine, Ser  Date Value Ref Range Status  03/25/2024 0.95 0.57 - 1.00 mg/dL Final         Passed - Valid encounter within last 12 months    Recent Outpatient Visits           3 weeks ago Type 2 diabetes mellitus with other specified complication, without long-term current use of insulin  [facility])   [place] [facility] [place], Marsa PARAS, DO   2 months ago Episodic migraine   [place] [facility] [place] [facility]a PARAS, DO   3 months ago Episodic migraine   [place] [facility] [place] Marsa PARAS, DO   4 months ago Hordeolum externum of right upper eyelid   [place] [facility] [place] Marsa PARAS, DO   4 months ago Episodic migraine    [facility] [facility]a PARAS, DO       Future Appointments             In 1 week Agbor-Etang, Redell, MD [facility] [place]   In 4 months Ines Onetha NOVAK, MD [facility]

## 2024-06-11 ENCOUNTER — Ambulatory Visit

## 2024-06-12 ENCOUNTER — Encounter: Payer: Self-pay | Admitting: Internal Medicine

## 2024-06-12 ENCOUNTER — Other Ambulatory Visit: Payer: Self-pay | Admitting: Family Medicine

## 2024-06-12 ENCOUNTER — Other Ambulatory Visit: Payer: Self-pay | Admitting: Internal Medicine

## 2024-06-12 ENCOUNTER — Encounter: Payer: Self-pay | Admitting: Family Medicine

## 2024-06-12 DIAGNOSIS — F411 Generalized anxiety disorder: Secondary | ICD-10-CM

## 2024-06-12 DIAGNOSIS — G43909 Migraine, unspecified, not intractable, without status migrainosus: Secondary | ICD-10-CM

## 2024-06-12 DIAGNOSIS — E1165 Type 2 diabetes mellitus with hyperglycemia: Secondary | ICD-10-CM

## 2024-06-12 NOTE — Telephone Encounter (Signed)
 Requested Prescriptions  Pending Prescriptions Disp Refills   TRULICITY  1.5 MG/0.5ML SOAJ [Pharmacy Med Name: TRULICITY  1.5MG /0.5ML INJ (4 PENS)] 2 mL 2    Sig: ADMINISTER 1.5 MG UNDER THE SKIN 1 TIME A WEEK     Endocrinology:  Diabetes - GLP-1 Receptor Agonists Passed - 06/12/2024  3:59 PM      Passed - HBA1C is between 0 and 7.9 and within 180 days    Hemoglobin A1C  Date Value Ref Range Status  05/13/2024 7.8 (A) 4.0 - 5.6 % Final   Hgb A1c MFr Bld  Date Value Ref Range Status  09/21/2023 9.0 (H) 4.8 - 5.6 % Final    Comment:    (NOTE) Pre diabetes:          5.7%-6.4%  Diabetes:              >6.4%  Glycemic control for   <7.0% adults with diabetes          Passed - Valid encounter within last 6 months    Recent Outpatient Visits           1 month ago Type 2 diabetes mellitus with other specified complication, without long-term current use of insulin  [facility])   [place] [facility] [place], Marsa PARAS, DO   2 months ago Episodic migraine   [place] [facility] [place] Marsa PARAS, DO   3 months ago Episodic migraine   [place] [facility] [place], Marsa PARAS, DO   4 months ago Hordeolum externum of right upper eyelid   [place] [facility] [place] Marsa PARAS, DO   4 months ago Episodic migraine    [facility] [facility] PARAS, DO       Future Appointments             In 2 days Agbor-Etang, Redell, MD [facility] [place]   In 4 months Ines Onetha NOVAK, MD [facility]

## 2024-06-12 NOTE — Telephone Encounter (Signed)
 Requested medication (s) are due for refill today:   Provider to review  Requested medication (s) are on the active medication list:   Yes  Future visit scheduled:   No.   LOV 05/13/2024   Last ordered: 05/03/2024 #60, 0 refills  Non delegated refill    Requested Prescriptions  Pending Prescriptions Disp Refills   ALPRAZolam  (XANAX ) 0.25 MG tablet [Pharmacy Med Name: ALPRAZOLAM  0.25MG  TABLETS] 60 tablet     Sig: TAKE 1 TABLET(0.25 MG) BY MOUTH TWICE DAILY     Not Delegated - Psychiatry: Anxiolytics/Hypnotics 2 Failed - 06/12/2024  3:53 PM      Failed - This refill cannot be delegated      Passed - Urine Drug Screen completed in last 360 days      Passed - Patient is not pregnant      Passed - Valid encounter within last 6 months    Recent Outpatient Visits           1 month ago Type 2 diabetes mellitus with other specified complication, without long-term current use of insulin  [facility])   [place] [facility] [place], Marsa PARAS, DO   2 months ago Episodic migraine   [place] [facility] [place] Marsa PARAS, DO   3 months ago Episodic migraine   [place] [facility] [place], Marsa PARAS, DO   4 months ago Hordeolum externum of right upper eyelid   [place] [facility] [place] [facility]a PARAS, DO   4 months ago Episodic migraine   [place] [facility] [place] [facility] PARAS, DO       Future Appointments             In 2 days Agbor-Etang, Redell, MD [facility] [place]   In 4 months Ines Onetha NOVAK, MD [facility]

## 2024-06-13 ENCOUNTER — Encounter: Payer: Self-pay | Admitting: Internal Medicine

## 2024-06-13 ENCOUNTER — Encounter

## 2024-06-13 MED ORDER — EMGALITY 120 MG/ML ~~LOC~~ SOAJ
SUBCUTANEOUS | 0 refills | Status: DC
Start: 2024-06-13 — End: 2024-09-03

## 2024-06-14 ENCOUNTER — Other Ambulatory Visit: Payer: Self-pay | Admitting: Internal Medicine

## 2024-06-14 ENCOUNTER — Ambulatory Visit: Attending: Cardiology | Admitting: Cardiology

## 2024-06-14 ENCOUNTER — Encounter: Payer: Self-pay | Admitting: Cardiology

## 2024-06-14 ENCOUNTER — Telehealth: Payer: Self-pay

## 2024-06-14 ENCOUNTER — Other Ambulatory Visit (HOSPITAL_COMMUNITY): Payer: Self-pay

## 2024-06-14 VITALS — BP 124/60 | HR 69 | Ht 67.0 in | Wt 241.4 lb

## 2024-06-14 DIAGNOSIS — F17200 Nicotine dependence, unspecified, uncomplicated: Secondary | ICD-10-CM | POA: Diagnosis not present

## 2024-06-14 DIAGNOSIS — J449 Chronic obstructive pulmonary disease, unspecified: Secondary | ICD-10-CM

## 2024-06-14 DIAGNOSIS — R072 Precordial pain: Secondary | ICD-10-CM | POA: Insufficient documentation

## 2024-06-14 DIAGNOSIS — I471 Supraventricular tachycardia, unspecified: Secondary | ICD-10-CM | POA: Insufficient documentation

## 2024-06-14 DIAGNOSIS — I10 Essential (primary) hypertension: Secondary | ICD-10-CM | POA: Insufficient documentation

## 2024-06-14 DIAGNOSIS — E782 Mixed hyperlipidemia: Secondary | ICD-10-CM | POA: Insufficient documentation

## 2024-06-14 DIAGNOSIS — R053 Chronic cough: Secondary | ICD-10-CM

## 2024-06-14 DIAGNOSIS — M542 Cervicalgia: Secondary | ICD-10-CM

## 2024-06-14 MED ORDER — TRELEGY ELLIPTA 200-62.5-25 MCG/ACT IN AEPB: 1.0000 | INHALATION_SPRAY | Freq: Every day | RESPIRATORY_TRACT | 5 refills | Status: AC

## 2024-06-14 MED ORDER — METOPROLOL TARTRATE 100 MG PO TABS: ORAL_TABLET | ORAL | 0 refills | Status: DC

## 2024-06-14 MED ORDER — AMLODIPINE BESYLATE 5 MG PO TABS: 5.0000 mg | ORAL_TABLET | Freq: Every day | ORAL | Status: AC

## 2024-06-14 MED ORDER — METOPROLOL SUCCINATE ER 50 MG PO TB24: 50.0000 mg | ORAL_TABLET | Freq: Every day | ORAL | 3 refills | Status: AC

## 2024-06-14 NOTE — Progress Notes (Signed)
 Prescription sent Trelegy 200

## 2024-06-14 NOTE — Telephone Encounter (Signed)
 The chest xray order has been placed so the patient just needs to show up at [facility] to do the xray

## 2024-06-14 NOTE — Progress Notes (Addendum)
 " Cardiology Office Note:    Date:  06/14/2024   ID:  Christy Mayer, DOB [birth year], MRN [id number]  PCP:  Edman Marsa PARAS, DO   [facility] Providers Cardiologist:  None     Referring MD: Edman Marsa *   Chief Complaint  Patient presents with   Follow-up    6-8 week follow up pt has complaints of chest pain on Wednesday of this week , chest pressure or SOB, medciation reviewed verbally with patient    History of Present Illness:    Christy Mayer is a 58 y.o. female with a hx of hypertension, diabetes, current smoker x 30+ years, COPD, migraine, who presents for follow-up.  She was last seen with symptoms of palpitations, cardiac monitor was placed revealing paroxysmal SVTs.  Toprol -XL 25 mg daily was started, patient states symptoms still persist.  Also endorses nonexertional left-sided chest pain, sometimes associated with stress or anxiety.  Pain radiates down her left arm and up her left neck.  Complains of migraines, vertigo.  Wants to make sure she has no obstruction in her carotids.  She still smokes, working on quitting.  Prior notes/testing Echocardiogram 09/2023 EF 65 to 70%, normal diastolic function.  Past Medical History:  Diagnosis Date   Arrhythmia    Arthritis    Asthma    Back pain    COPD (chronic obstructive pulmonary disease) (HCC)    COPD (chronic obstructive pulmonary disease) (HCC)    Diabetes mellitus without complication (HCC)    Emphysema lung (HCC)    Heart palpitations    Hypertension    Miscarriage    Mitral valve prolapse     Past Surgical History:  Procedure Laterality Date   CESAREAN SECTION  11/11/2007   SKIN CANCER EXCISION     back    Current Medications: Current Meds  Medication Sig   ACCU-CHEK GUIDE TEST test strip Use to check blood sugar up to 3 times per day   Accu-Chek Softclix Lancets lancets Check sugar up to 3 times daily   albuterol  (VENTOLIN  HFA) 108 (90 Base) MCG/ACT inhaler Inhale  2 puffs into the lungs every 6 (six) hours as needed for wheezing or shortness of breath.   Alcohol  Swabs  PADS 1 Dose by Does not apply route 3 (three) times daily before meals.   ALPRAZolam  (XANAX ) 0.25 MG tablet TAKE 1 TABLET(0.25 MG) BY MOUTH TWICE DAILY   BD PEN NEEDLE MINI ULTRAFINE 31G X 5 MM MISC Inject into the skin 3 (three) times daily.   Blood Glucose Calibration (ACCU-CHEK GUIDE CONTROL) LIQD Use for glucometer   Blood Glucose Monitoring Suppl (ACCU-CHEK GUIDE ME) w/Device KIT Use to check blood sugar up to 3 times per day   botulinum toxin Type A  (BOTOX ) 200 units injection Inject 155 units into head and neck muscles every 3 months.  Discard remainder.   buPROPion  (WELLBUTRIN  XL) 150 MG 24 hr tablet Take 1 tablet (150 mg total) by mouth daily.   clindamycin (CLEOCIN) 300 MG capsule Take 300 mg by mouth.   clopidogrel  (PLAVIX ) 75 MG tablet Take 1 tablet (75 mg total) by mouth daily.   EMGALITY  120 MG/ML SOAJ INJECT 240 MG UNDER THE SKIN ONCE FOR 1 DOSE.   Fluticasone -Umeclidin-Vilant (TRELEGY ELLIPTA ) 200-62.5-25 MCG/ACT AEPB Inhale 1 puff into the lungs daily.   Fluticasone -Umeclidin-Vilant (TRELEGY ELLIPTA ) 200-62.5-25 MCG/ACT AEPB Inhale 1 Act into the lungs daily.   gabapentin  (NEURONTIN ) 300 MG capsule TAKE 1 CAPSULE(300 MG) BY MOUTH THREE TIMES DAILY  HYDROcodone -acetaminophen  (NORCO/VICODIN) 5-325 MG tablet Take 1 tablet by mouth every 6 (six) hours as needed.   insulin  glargine (LANTUS  SOLOSTAR) 100 UNIT/ML Solostar Pen Inject 20 Units into the skin daily.   meloxicam (MOBIC) 15 MG tablet Take 15 mg by mouth daily.   metoprolol  tartrate (LOPRESSOR ) 100 MG tablet TAKE 1 TABLET 2 HR PRIOR TO CARDIAC PROCEDURE   ondansetron  (ZOFRAN -ODT) 4 MG disintegrating tablet Take 1 tablet (4 mg total) by mouth every 8 (eight) hours as needed for nausea or vomiting.   rosuvastatin  (CRESTOR ) 20 MG tablet Take 1 tablet (20 mg total) by mouth daily.   SYMBICORT  160-4.5 MCG/ACT inhaler Inhale 2  puffs into the lungs 2 (two) times daily.   TRULICITY  1.5 MG/0.5ML SOAJ ADMINISTER 1.5 MG UNDER THE SKIN 1 TIME A WEEK   [DISCONTINUED] amLODipine  (NORVASC ) 10 MG tablet Take 1 tablet (10 mg total) by mouth daily.   [DISCONTINUED] metoprolol  succinate (TOPROL -XL) 25 MG 24 hr tablet Take 1 tablet (25 mg total) by mouth daily. Take with or immediately following a meal.   Current Facility-Administered Medications for the 06/14/24 encounter (Office Visit) with Darliss Rogue, MD  Medication   botulinum toxin Type A  (BOTOX ) injection 155 Units     Allergies:   Aspirin    Social History   Socioeconomic History   Marital status: Married    Spouse name: Not on file   Number of children: Not on file   Years of education: Not on file   Highest education level: Some college, no degree  Occupational History   Occupation: work from home  Tobacco Use   Smoking status: Every Day    Current packs/day: 0.50    Average packs/day: 0.5 packs/day for 30.0 years (15.0 ttl pk-yrs)    Types: Cigarettes    Passive exposure: Never   Smokeless tobacco: Never  Vaping Use   Vaping status: Never Used  Substance and Sexual Activity   Alcohol  use: Yes    Alcohol /week: 0.0 standard drinks of alcohol     Comment: occasionally    Drug use: No   Sexual activity: Yes    Birth control/protection: None  Other Topics Concern   Not on file  Social History Narrative   Caffiene 2 cups in am coffee   Lives home husband, grown kids, cats, dogs gecke and chickens   Working: STD disability from migraines (pcp).     Social Drivers of Corporate Investment Banker Strain: Low Risk  (06/04/2024)   Received from [facility]   Overall Financial Resource Strain (CARDIA)    Difficulty of Paying Living Expenses: Not very hard  Food Insecurity: Food Insecurity Present (06/04/2024)   [facility]   Hunger Vital Sign    Within the past 12 months, you worried that your food  would run out before you got the money to buy more.: Sometimes true    Within the past 12 months, the food you bought just didn't last and you didn't have money to get more.: Sometimes true  Transportation Needs: No Transportation Needs (06/04/2024)   [facility] - Transportation    In the past 12 months, has lack of transportation kept you from medical appointments or from getting medications?: No    Lack of Transportation (Non-Medical): No  Physical Activity: Inactive (05/10/2024)   Exercise Vital Sign    Days of Exercise per Week: 0 days    Minutes of Exercise per Session:  Not on file  Stress: Stress Concern Present (05/10/2024)   [employer] of Occupational Health - Occupational Stress Questionnaire    Feeling of Stress: Rather much  Social Connections: Moderately Isolated (05/10/2024)   Social Connection and Isolation Panel    Frequency of Communication with Friends and Family: More than three times a week    Frequency of Social Gatherings with Friends and Family: Once a week    Attends Religious Services: Never    Database Administrator or Organizations: No    Attends Engineer, Structural: Not on file    Marital Status: Married     Family History: The patient's family history is not on file. She was adopted.  ROS:   Please see the history of present illness.     All other systems reviewed and are negative.  EKGs/Labs/Other Studies Reviewed:    The following studies were reviewed today:       Recent Labs: 09/21/2023: ALT 26 09/22/2023: Hemoglobin 12.7; Magnesium  2.2; Platelets 189; Potassium 3.9; Sodium 134 03/25/2024: BUN 17; Creatinine, Ser 0.95  Recent Lipid Panel    Component Value Date/Time   CHOL 260 (H) 04/19/2024 0934   TRIG 222 (H) 04/19/2024 0934   HDL 42 04/19/2024 0934   CHOLHDL 6.2 (H) 04/19/2024 0934   CHOLHDL 5.9 (H) 07/30/2018 1106   VLDL 44 (H) 04/17/2011 0530   LDLCALC 176 (H) 04/19/2024 0934    LDLCALC 172 (H) 07/30/2018 1106     Risk Assessment/Calculations:             Physical Exam:    VS:  BP 124/60 (BP Location: Left Arm, Patient Position: Sitting, Cuff Size: Normal)   Pulse 69   Ht 5' 7 (1.702 m)   Wt 241 lb 6.4 oz (109.5 kg)   LMP 08/11/2014   SpO2 98%   BMI 37.81 kg/m     Wt Readings from Last 3 Encounters:  06/14/24 241 lb 6.4 oz (109.5 kg)  05/24/24 240 lb 3.2 oz (109 kg)  05/13/24 239 lb 6 oz (108.6 kg)     GEN:  Well nourished, well developed in no acute distress HEENT: Normal NECK: No JVD; No carotid bruits CARDIAC: RRR, no murmurs, rubs, gallops RESPIRATORY: Diminished breath sounds, no wheezing. ABDOMEN: Soft, non-tender, non-distended MUSCULOSKELETAL:  No edema; No deformity  SKIN: Warm and dry NEUROLOGIC:  Alert and oriented x 3 PSYCHIATRIC:  Normal affect   ASSESSMENT:    1. Precordial pain   2. Paroxysmal SVT (supraventricular tachycardia) (HCC)   3. Primary hypertension   4. Mixed hyperlipidemia   5. Current smoker    PLAN:    In order of problems listed above:  Chest pain, several risk factors.  Echo 12/24 EF 65%.  Obtain coronary CTA. Palpitations, cardiac monitor showed paroxysmal SVT.  Increase Toprol -XL to 50 mg daily. Hypertension, BP controlled.  Toprol -XL as above, reduce Norvasc  to 5 mg daily.   Hyperlipidemia, start Crestor  20 mg daily, continue Plavix . Current smoker, smoking cessation again advised.  Also complained of left neck pain, dizziness.  Will obtain carotid ultrasound to evaluate obstructive disease.  Follow-up in 6 weeks      Medication Adjustments/Labs and Tests Ordered: Current medicines are reviewed at length with the patient today.  Concerns regarding medicines are outlined above.  Orders Placed This Encounter  Procedures   CT CORONARY MORPH W/CTA COR W/SCORE W/CA W/CM &/OR WO/CM   Basic metabolic panel with GFR   EKG 87-Ozji   VAS US   CAROTID   Meds ordered this encounter  Medications    metoprolol  tartrate (LOPRESSOR ) 100 MG tablet    Sig: TAKE 1 TABLET 2 HR PRIOR TO CARDIAC PROCEDURE    Dispense:  1 tablet    Refill:  0   metoprolol  succinate (TOPROL -XL) 50 MG 24 hr tablet    Sig: Take 1 tablet (50 mg total) by mouth daily. Take with or immediately following a meal.    Dispense:  90 tablet    Refill:  3   amLODipine  (NORVASC ) 5 MG tablet    Sig: Take 1 tablet (5 mg total) by mouth daily.    Patient Instructions  Medication Instructions:  - DECREASE amlodipine  to 5 mg - INCREASE Toprol  XL to 50 mg *If you need a refill on your cardiac medications before your next appointment, please call your pharmacy*  Lab Work: Your provider would like for you to have following labs drawn today BMP.    If you have labs (blood work) drawn today and your tests are completely normal, you will receive your results only by: MyChart Message (if you have MyChart) OR A paper copy in the mail If you have any lab test that is abnormal or we need to change your treatment, we will call you to review the results.  Testing/Procedures:   Your cardiac CT will be scheduled at:  [facility] [address] [place], [place] [address] [phone]  Please arrive 15 mins early for check-in and test prep.  There is spacious parking and easy access to the radiology department from the [facility] Heart and Vascular entrance. Please enter here and check-in with the desk attendant.    Please follow these instructions carefully (unless otherwise directed):  An IV will be required for this test and Nitroglycerin will be given.  Hold all erectile dysfunction medications at least 3 days (72 hrs) prior to test. (Ie viagra, cialis, sildenafil, tadalafil, etc)     On the Night Before the Test: Be sure to Drink plenty of water. Do not consume any caffeinated/decaffeinated beverages or chocolate 12 hours prior to your test. Do not take any antihistamines 12 hours prior to your  test.  On the Day of the Test: Drink plenty of water until 1 hour prior to the test. Do not eat any food 1 hour prior to test. You may take your regular medications prior to the test.  Take metoprolol  (Lopressor ) two hours prior to test. If you take Furosemide/Hydrochlorothiazide/Spironolactone/Chlorthalidone, please HOLD on the morning of the test. Patients who wear a continuous glucose monitor MUST remove the device prior to scanning. FEMALES- please wear underwire-free bra if available, avoid dresses & tight clothing       After the Test: Drink plenty of water. After receiving IV contrast, you may experience a mild flushed feeling. This is normal. On occasion, you may experience a mild rash up to 24 hours after the test. This is not dangerous. If this occurs, you can take Benadryl  25 mg, Zyrtec, Claritin , or Allegra and increase your fluid intake. (Patients taking Tikosyn should avoid Benadryl , and may take Zyrtec, Claritin , or Allegra) If you experience trouble breathing, this can be serious. If it is severe call 911 IMMEDIATELY. If it is mild, please call our office.  We will call to schedule your test 2-4 weeks out understanding that some insurance companies will need an authorization prior to the service being performed.   For more information and frequently asked questions, please visit our  website : [email or web address]  For non-scheduling related questions, please contact the cardiac imaging nurse navigator should you have any questions/concerns: Cardiac Imaging Nurse Navigators Direct Office Dial: [phone]   For scheduling needs, including cancellations and rescheduling, please call [place], [phone].    Your physician has requested that you have a carotid duplex. This test is an ultrasound of the carotid arteries in your neck. It looks at blood flow through these arteries that supply the brain with blood.   Allow one hour for this exam.  There are no  restrictions or special instructions.  This will take place at [address] [facility]) [address], [place] [address]  Please note: We ask at that you not bring children with you during ultrasound (echo/ vascular) testing. Due to room size and safety concerns, children are not allowed in the ultrasound rooms during exams. Our front office staff cannot provide observation of children in our lobby area while testing is being conducted. An adult accompanying a patient to their appointment will only be allowed in the ultrasound room at the discretion of the ultrasound technician under special circumstances. We apologize for any inconvenience.   Follow-Up: At [facility], you and your health needs are our priority.  As part of our continuing mission to provide you with exceptional heart care, our providers are all part of one team.  This team includes your primary Cardiologist (physician) and Advanced Practice Providers or APPs (Physician Assistants and Nurse Practitioners) who all work together to provide you with the care you need, when you need it.  Your next appointment:   3 month(s)  Provider:   You may see Dr. Darliss or one of the following Advanced Practice Providers on your designated Care Team:   Lonni Meager, NP Lesley Maffucci, PA-C Bernardino Bring, PA-C Cadence [place]bury, PA-C Tylene Lunch, NP Barnie Hila, NP    We recommend signing up for the patient portal called MyChart.  Sign up information is provided on this After Visit Summary.  MyChart is used to connect with patients for Virtual Visits (Telemedicine).  Patients are able to view lab/test results, encounter notes, upcoming appointments, etc.  Non-urgent messages can be sent to your provider as well.   To learn more about what you can do with MyChart, go to [email or web address].          Signed, Redell Darliss, MD  06/14/2024 12:54 PM     [facility] "

## 2024-06-14 NOTE — Telephone Encounter (Signed)
 PA has been approved

## 2024-06-14 NOTE — Telephone Encounter (Signed)
 Patient has [employer] and she will need to pick up the HST machine from our office I have placed her HST order in my stach there are like 20 people ahead of her. I only have 2 machines

## 2024-06-14 NOTE — Progress Notes (Signed)
 Insurance company has denied obtaining CT chest to assess lungs. Will obtain CXR first

## 2024-06-14 NOTE — Patient Instructions (Addendum)
 Medication Instructions:  - DECREASE amlodipine  to 5 mg - INCREASE Toprol  XL to 50 mg *If you need a refill on your cardiac medications before your next appointment, please call your pharmacy*  Lab Work: Your provider would like for you to have following labs drawn today BMP.    If you have labs (blood work) drawn today and your tests are completely normal, you will receive your results only by: MyChart Message (if you have MyChart) OR A paper copy in the mail If you have any lab test that is abnormal or we need to change your treatment, we will call you to review the results.  Testing/Procedures:   Your cardiac CT will be scheduled at:  [facility] [address] [place], [place] [address] [phone]  Please arrive 15 mins early for check-in and test prep.  There is spacious parking and easy access to the radiology department from the [facility] Heart and Vascular entrance. Please enter here and check-in with the desk attendant.    Please follow these instructions carefully (unless otherwise directed):  An IV will be required for this test and Nitroglycerin will be given.  Hold all erectile dysfunction medications at least 3 days (72 hrs) prior to test. (Ie viagra, cialis, sildenafil, tadalafil, etc)     On the Night Before the Test: Be sure to Drink plenty of water. Do not consume any caffeinated/decaffeinated beverages or chocolate 12 hours prior to your test. Do not take any antihistamines 12 hours prior to your test.  On the Day of the Test: Drink plenty of water until 1 hour prior to the test. Do not eat any food 1 hour prior to test. You may take your regular medications prior to the test.  Take metoprolol  (Lopressor ) two hours prior to test. If you take Furosemide/Hydrochlorothiazide/Spironolactone/Chlorthalidone, please HOLD on the morning of the test. Patients who wear a continuous glucose monitor MUST remove the device prior to  scanning. FEMALES- please wear underwire-free bra if available, avoid dresses & tight clothing       After the Test: Drink plenty of water. After receiving IV contrast, you may experience a mild flushed feeling. This is normal. On occasion, you may experience a mild rash up to 24 hours after the test. This is not dangerous. If this occurs, you can take Benadryl  25 mg, Zyrtec, Claritin , or Allegra and increase your fluid intake. (Patients taking Tikosyn should avoid Benadryl , and may take Zyrtec, Claritin , or Allegra) If you experience trouble breathing, this can be serious. If it is severe call 911 IMMEDIATELY. If it is mild, please call our office.  We will call to schedule your test 2-4 weeks out understanding that some insurance companies will need an authorization prior to the service being performed.   For more information and frequently asked questions, please visit our website : [email or web address]  For non-scheduling related questions, please contact the cardiac imaging nurse navigator should you have any questions/concerns: Cardiac Imaging Nurse Navigators Direct Office Dial: [phone]   For scheduling needs, including cancellations and rescheduling, please call [place], [phone].    Your physician has requested that you have a carotid duplex. This test is an ultrasound of the carotid arteries in your neck. It looks at blood flow through these arteries that supply the brain with blood.   Allow one hour for this exam.  There are no restrictions or special instructions.  This will take place at [address] [facility] [address] [facility] [address], [place] [address]  Please note: We ask at that you not bring children with you during ultrasound (echo/ vascular) testing. Due to room size and safety concerns, children are not allowed in the ultrasound rooms during exams. Our front office staff cannot provide observation of children in our lobby area while testing is  being conducted. An adult accompanying a patient to their appointment will only be allowed in the ultrasound room at the discretion of the ultrasound technician under special circumstances. We apologize for any inconvenience.   Follow-Up: At [facility], you and your health needs are our priority.  As part of our continuing mission to provide you with exceptional heart care, our providers are all part of one team.  This team includes your primary Cardiologist (physician) and Advanced Practice Providers or APPs (Physician Assistants and Nurse Practitioners) who all work together to provide you with the care you need, when you need it.  Your next appointment:   3 month(s)  Provider:   You may see Dr. Darliss or one of the following Advanced Practice Providers on your designated Care Team:   Lonni Meager, NP Lesley Maffucci, PA-C Bernardino Bring, PA-C Cadence [place], PA-C Tylene Lunch, NP Barnie Hila, NP    We recommend signing up for the patient portal called MyChart.  Sign up information is provided on this After Visit Summary.  MyChart is used to connect with patients for Virtual Visits (Telemedicine).  Patients are able to view lab/test results, encounter notes, upcoming appointments, etc.  Non-urgent messages can be sent to your provider as well.   To learn more about what you can do with MyChart, go to [email or web address].

## 2024-06-14 NOTE — Telephone Encounter (Signed)
 Pharmacy Patient Advocate Encounter   Received notification from Physician's Office that prior authorization for Emgality  is required/requested.   Insurance verification completed.   The patient is insured through [employer] .   Per test claim: PA required and submitted KEY/EOC/Request #: [id number] from 06/14/2024 to 06/14/2025

## 2024-06-15 LAB — BASIC METABOLIC PANEL WITH GFR
BUN/Creatinine Ratio: 14 (ref 9–23)
BUN: 13 mg/dL (ref 6–24)
CO2: 22 mmol/L (ref 20–29)
Calcium: 9.3 mg/dL (ref 8.7–10.2)
Chloride: 101 mmol/L (ref 96–106)
Creatinine, Ser: 0.92 mg/dL (ref 0.57–1.00)
Glucose: 163 mg/dL — ABNORMAL HIGH (ref 70–99)
Potassium: 4.7 mmol/L (ref 3.5–5.2)
Sodium: 141 mmol/L (ref 134–144)
eGFR: 72 mL/min/1.73 (ref >59)

## 2024-06-21 ENCOUNTER — Encounter: Payer: Self-pay | Admitting: Neurology

## 2024-06-21 ENCOUNTER — Encounter: Payer: Self-pay | Admitting: Family Medicine

## 2024-06-21 ENCOUNTER — Encounter: Payer: Self-pay | Admitting: Internal Medicine

## 2024-06-21 ENCOUNTER — Encounter: Payer: Self-pay | Admitting: Cardiology

## 2024-06-24 ENCOUNTER — Ambulatory Visit

## 2024-06-28 ENCOUNTER — Other Ambulatory Visit: Payer: Self-pay | Admitting: Family Medicine

## 2024-06-28 DIAGNOSIS — M4722 Other spondylosis with radiculopathy, cervical region: Secondary | ICD-10-CM

## 2024-06-28 DIAGNOSIS — G43909 Migraine, unspecified, not intractable, without status migrainosus: Secondary | ICD-10-CM

## 2024-06-28 NOTE — Telephone Encounter (Signed)
 Requested Prescriptions  Pending Prescriptions Disp Refills   gabapentin  (NEURONTIN ) 300 MG capsule [Pharmacy Med Name: GABAPENTIN  300MG  CAPSULES] 270 capsule 0    Sig: TAKE 1 CAPSULE(300 MG) BY MOUTH THREE TIMES DAILY     Neurology: Anticonvulsants - gabapentin  Passed - 06/28/2024  2:02 PM      Passed - Cr in normal range and within 360 days    Creat  Date Value Ref Range Status  07/30/2018 0.89 0.50 - 1.05 mg/dL Final    Comment:    For patients >53 years of age, the reference limit for Creatinine is approximately 13% higher for people identified as African-American. .    Creatinine, Ser  Date Value Ref Range Status  06/14/2024 0.92 0.57 - 1.00 mg/dL Final         Passed - Completed PHQ-2 or PHQ-9 in the last 360 days      Passed - Valid encounter within last 12 months    Recent Outpatient Visits           1 month ago Type 2 diabetes mellitus with other specified complication, without long-term current use of insulin  [facility])   [place] [facility] J[facility]att, Marsa PARAS, DO   2 months ago Episodic migraine   [place] [facility] Edman Marsa PARAS, DO   4 months ago Episodic migraine   [facility] Thaxton, Marsa PARAS, DO   4 months ago Hordeolum externum of right upper eyelid   [place] [facility] Edman Marsa PARAS, DO   5 months ago Episodic migraine   [place] [facility] Edman Marsa PARAS, DO       Future Appointments             In 2 months Agbor-Etang, Redell, MD [facility] [place]   In 3 months Ines Onetha NOVAK, MD [facility]

## 2024-07-11 ENCOUNTER — Other Ambulatory Visit: Payer: Self-pay

## 2024-07-18 ENCOUNTER — Telehealth: Payer: Self-pay | Admitting: Neurology

## 2024-07-18 NOTE — Telephone Encounter (Signed)
 Called and left voicemail for patient to reschedule appointment on 10/23/24 with Dr Ines.  If patient calls back, they can be rescheduled with Dr Gregg

## 2024-07-19 ENCOUNTER — Telehealth: Payer: Self-pay

## 2024-07-19 NOTE — Telephone Encounter (Signed)
 Pharmacy Patient Advocate Encounter  Received notification from [employer] that Prior Authorization for Trelegy has been APPROVED from 07/19/2024 to 07/19/2025

## 2024-07-19 NOTE — Telephone Encounter (Signed)
 Questions populated and answered, pending determination

## 2024-07-19 NOTE — Telephone Encounter (Signed)
*  Pulm  Pharmacy Patient Advocate Encounter   Received notification from Fax that prior authorization for Trelegy Ellipta  200-62.5-25MCG/ACT aerosol powder  is required/requested.   Insurance verification completed.   The patient is insured through [employer].   Per test claim: PA required; PA started via CoverMyMeds. KEY [id number] . Waiting for clinical questions to populate.

## 2024-07-22 ENCOUNTER — Other Ambulatory Visit: Payer: Self-pay

## 2024-07-23 ENCOUNTER — Other Ambulatory Visit: Payer: Self-pay

## 2024-07-24 ENCOUNTER — Other Ambulatory Visit: Payer: Self-pay

## 2024-07-24 ENCOUNTER — Other Ambulatory Visit (HOSPITAL_COMMUNITY): Payer: Self-pay

## 2024-07-26 ENCOUNTER — Other Ambulatory Visit: Payer: Self-pay

## 2024-07-29 ENCOUNTER — Encounter: Payer: Self-pay | Admitting: Family Medicine

## 2024-07-29 ENCOUNTER — Other Ambulatory Visit (HOSPITAL_COMMUNITY): Payer: Self-pay

## 2024-08-01 ENCOUNTER — Ambulatory Visit: Attending: Cardiology

## 2024-08-01 ENCOUNTER — Ambulatory Visit: Payer: Self-pay | Admitting: Family Medicine

## 2024-08-09 ENCOUNTER — Encounter (HOSPITAL_COMMUNITY): Payer: Self-pay

## 2024-08-12 ENCOUNTER — Encounter: Payer: Self-pay | Admitting: Family Medicine

## 2024-08-12 DIAGNOSIS — M4722 Other spondylosis with radiculopathy, cervical region: Secondary | ICD-10-CM

## 2024-08-12 DIAGNOSIS — G43909 Migraine, unspecified, not intractable, without status migrainosus: Secondary | ICD-10-CM

## 2024-08-13 MED ORDER — GABAPENTIN 300 MG PO CAPS: 600.0000 mg | ORAL_CAPSULE | Freq: Two times a day (BID) | ORAL | 0 refills | Status: DC

## 2024-08-13 NOTE — Addendum Note (Signed)
 Addended by: EDMAN MARSA PARAS on: 08/13/2024 12:04 PM   Modules accepted: Orders

## 2024-08-19 ENCOUNTER — Other Ambulatory Visit: Payer: Self-pay | Admitting: Medical Genetics

## 2024-08-19 DIAGNOSIS — Z006 Encounter for examination for normal comparison and control in clinical research program: Secondary | ICD-10-CM

## 2024-08-22 ENCOUNTER — Other Ambulatory Visit (HOSPITAL_COMMUNITY): Payer: Self-pay

## 2024-08-28 ENCOUNTER — Ambulatory Visit: Admitting: Internal Medicine

## 2024-08-28 NOTE — Progress Notes (Deleted)
 [facility] Consultation      Date: 08/28/2024,   MRN# [id number] Christy Mayer 01/02/1966     CHIEF COMPLAINT:   Assessment of COPD Assessment of OSA   HISTORY OF PRESENT ILLNESS   Patient is seen today for problems and issues with sleep related to excessive daytime sleepiness Patient  has been having sleep problems for many years Patient has been having excessive daytime sleepiness for a long time Patient has been having extreme fatigue and tiredness, lack of energy +  very Loud snoring every night Patient also having history of migraines Neurology assessment requiring assessing for OSA  Discussed sleep data and reviewed with patient.  Encouraged proper weight management.  Discussed driving precautions and its relationship with hypersomnolence.  Discussed operating dangerous equipment and its relationship with hypersomnolence.  Discussed sleep hygiene, and benefits of a fixed sleep waked time.  The importance of getting eight or more hours of sleep discussed with patient.  Discussed limiting the use of the computer and television before bedtime.  Decrease naps during the day, so night time sleep will become enhanced.  Limit caffeine , and sleep deprivation.  HTN, stroke, and heart failure are potential risk factors.   Discussed risk of untreated sleep apnea including cardiac arrhthymias, stroke, DM, pulm HTN.      05/24/2024    8:00 AM  Results of the Epworth flowsheet  Sitting and reading 3  Watching TV 3  Sitting, inactive in a public place (e.g. a theatre or a meeting) 1  As a passenger in a car for an hour without a break 2  Lying down to rest in the afternoon when circumstances permit 3  Sitting and talking to someone 0  Sitting quietly after a lunch without alcohol  3  In a car, while stopped for a few minutes in traffic 0  Total score 15     Assessment COPD Diagnosed in 2017 Patient with significant pneumonia 2023 CT chest reviewed  below Multifocal opacities consistent with infectious etiology Patient active smoker half pack a day for 40 years Patient currently on Symbicort  using albuterol  inhaler daily nebulizers as needed Plan to start Trelegy No exacerbation at this time No evidence of heart failure at this time No evidence or signs of infection at this time No respiratory distress No fevers, chills, nausea, vomiting, diarrhea No evidence of lower extremity edema No evidence hemoptysis        Patient with a history of tachycardia sees cardiology Likely related to smoking history as well as probable underlying sleep apnea   PAST MEDICAL HISTORY   Past Medical History:  Diagnosis Date   Arrhythmia    Arthritis    Asthma    Back pain    COPD (chronic obstructive pulmonary disease) (HCC)    COPD (chronic obstructive pulmonary disease) (HCC)    Diabetes mellitus without complication (HCC)    Emphysema lung (HCC)    Heart palpitations    Hypertension    Miscarriage    Mitral valve prolapse      SURGICAL HISTORY   Past Surgical History:  Procedure Laterality Date   CESAREAN SECTION  11/11/2007   SKIN CANCER EXCISION     back     FAMILY HISTORY   Family History  Adopted: Yes     SOCIAL HISTORY   Social History   Tobacco Use   Smoking status: Every Day    Current packs/day: 0.50    Average packs/day: 0.5 packs/day for 30.0 years (15.0  ttl pk-yrs)    Types: Cigarettes    Passive exposure: Never   Smokeless tobacco: Never  Vaping Use   Vaping status: Never Used  Substance Use Topics   Alcohol  use: Yes    Alcohol /week: 0.0 standard drinks of alcohol     Comment: occasionally    Drug use: No     MEDICATIONS    Home Medication:  Current Outpatient Rx   Order #: [id number] Class: Normal   Order #: [id number] Class: Normal   Order #: [id number] Class: Normal   Order #: [id number] Class: Normal   Order #: [id number] Class: Normal   Order #: [id number] Class: No Print   Order #:  [id number] Class: Normal   Order #: [id number] Class: Historical Med   Order #: [id number] Class: Normal   Order #: [id number] Class: Normal   Order #: [id number] Class: Normal   Order #: [id number] Class: Normal   Order #: [id number] Class: Historical Med   Order #: [id number] Class: Historical Med   Order #: [id number] Class: Normal   Order #: [id number] Class: Normal   Order #: [id number] Class: Sample   Order #: [id number] Class: Normal   Order #: [id number] Class: Normal   Order #: [id number] Class: Historical Med   Order #: [id number] Class: Normal   Order #: [id number] Class: Normal   Order #: [id number] Class: Historical Med   Order #: [id number] Class: Historical Med   Order #: [id number] Class: Normal   Order #: [id number] Class: Normal   Order #: [id number] Class: Historical Med   Order #: [id number] Class: Normal   Order #: [id number] Class: Normal   Order #: [id number] Class: Normal   Order #: [id number] Class: Normal   Order #: [id number] Class: Historical Med   Order #: [id number] Class: Normal    Current Medication:  Current Outpatient Medications:    ACCU-CHEK GUIDE TEST test strip, Use to check blood sugar up to 3 times per day, Disp: 300 each, Rfl: 3   Accu-Chek Softclix Lancets lancets, Check sugar up to 3 times daily, Disp: 300 each, Rfl: 3   albuterol  (VENTOLIN  HFA) 108 (90 Base) MCG/ACT inhaler, Inhale 2 puffs into the lungs every 6 (six) hours as needed for wheezing or shortness of breath., Disp: 8 g, Rfl: 3   Alcohol  Swabs  PADS, 1 Dose by Does not apply route 3 (three) times daily before meals., Disp: 200 each, Rfl: 0   ALPRAZolam  (XANAX ) 0.25 MG tablet, TAKE 1 TABLET(0.25 MG) BY MOUTH TWICE DAILY, Disp: 60 tablet, Rfl: 2   amLODipine  (NORVASC ) 5 MG tablet, Take 1 tablet (5 mg total) by mouth daily., Disp: , Rfl:    amoxicillin -clavulanate (AUGMENTIN ) 875-125 MG tablet, Take 1 tablet by mouth 2 (two) times daily. For 14 days (Patient not taking: Reported on 06/14/2024), Disp: 28 tablet, Rfl: 0   BD PEN NEEDLE  MINI ULTRAFINE 31G X 5 MM MISC, Inject into the skin 3 (three) times daily., Disp: , Rfl:    Blood Glucose Calibration (ACCU-CHEK GUIDE CONTROL) LIQD, Use for glucometer, Disp: 1 each, Rfl: 0   Blood Glucose Monitoring Suppl (ACCU-CHEK GUIDE ME) w/Device KIT, Use to check blood sugar up to 3 times per day, Disp: 1 kit, Rfl: 0   botulinum toxin Type A  (BOTOX ) 200 units injection, Inject 155 units into head and neck muscles every 3 months.  Discard remainder., Disp: 1 each, Rfl: 3   buPROPion  (WELLBUTRIN  XL) 150 MG 24 hr tablet, Take 1 tablet (150 mg total) by mouth daily., Disp: 90 tablet, Rfl: 1   cephALEXin  (KEFLEX ) 500 MG capsule, Take 500 mg by mouth 2 (two) times daily. (Patient not taking: Reported on 06/14/2024), Disp: ,  Rfl:    clindamycin (CLEOCIN) 300 MG capsule, Take 300 mg by mouth., Disp: , Rfl:    clopidogrel  (PLAVIX ) 75 MG tablet, Take 1 tablet (75 mg total) by mouth daily., Disp: 90 tablet, Rfl: 3   EMGALITY  120 MG/ML SOAJ, INJECT 240 MG UNDER THE SKIN ONCE FOR 1 DOSE., Disp: 2 mL, Rfl: 0   Fluticasone -Umeclidin-Vilant (TRELEGY ELLIPTA ) 200-62.5-25 MCG/ACT AEPB, Inhale 1 puff into the lungs daily., Disp: , Rfl:    Fluticasone -Umeclidin-Vilant (TRELEGY ELLIPTA ) 200-62.5-25 MCG/ACT AEPB, Inhale 1 Act into the lungs daily., Disp: 1 each, Rfl: 5   gabapentin  (NEURONTIN ) 300 MG capsule, Take 2 capsules (600 mg total) by mouth 2 (two) times daily., Disp: 120 capsule, Rfl: 0   HYDROcodone -acetaminophen  (NORCO/VICODIN) 5-325 MG tablet, Take 1 tablet by mouth every 6 (six) hours as needed., Disp: , Rfl:    hydrOXYzine  (ATARAX ) 25 MG tablet, TAKE 1 TABLET(25 MG) BY MOUTH THREE TIMES DAILY AS NEEDED FOR ANXIETY (Patient not taking: Reported on 06/14/2024), Disp: 270 tablet, Rfl: 1   insulin  glargine (LANTUS  SOLOSTAR) 100 UNIT/ML Solostar Pen, Inject 20 Units into the skin daily., Disp: 15 mL, Rfl: 2   LEVEMIR  FLEXPEN 100 UNIT/ML FlexPen, Inject into the skin daily. (Patient not taking: Reported on  06/14/2024), Disp: , Rfl:    meloxicam (MOBIC) 15 MG tablet, Take 15 mg by mouth daily., Disp: , Rfl:    metoprolol  succinate (TOPROL -XL) 50 MG 24 hr tablet, Take 1 tablet (50 mg total) by mouth daily. Take with or immediately following a meal., Disp: 90 tablet, Rfl: 3   metoprolol  tartrate (LOPRESSOR ) 100 MG tablet, TAKE 1 TABLET 2 HR PRIOR TO CARDIAC PROCEDURE, Disp: 1 tablet, Rfl: 0   naproxen  (NAPROSYN ) 500 MG tablet, Take 500 mg by mouth 2 (two) times daily. (Patient not taking: Reported on 06/14/2024), Disp: , Rfl:    ondansetron  (ZOFRAN -ODT) 4 MG disintegrating tablet, Take 1 tablet (4 mg total) by mouth every 8 (eight) hours as needed for nausea or vomiting., Disp: 30 tablet, Rfl: 2   rosuvastatin  (CRESTOR ) 20 MG tablet, Take 1 tablet (20 mg total) by mouth daily., Disp: 90 tablet, Rfl: 3   SYMBICORT  160-4.5 MCG/ACT inhaler, Inhale 2 puffs into the lungs 2 (two) times daily., Disp: 1 each, Rfl: 12   tiZANidine  (ZANAFLEX ) 4 MG tablet, Take 1 tablet (4 mg total) by mouth every 8 (eight) hours as needed for muscle spasms. (Patient not taking: Reported on 06/14/2024), Disp: 30 tablet, Rfl: 0   traMADol  (ULTRAM ) 50 MG tablet, Take 50 mg by mouth 2 (two) times daily as needed. (Patient not taking: Reported on 06/14/2024), Disp: , Rfl:    TRULICITY  1.5 MG/0.5ML SOAJ, ADMINISTER 1.5 MG UNDER THE SKIN 1 TIME A WEEK, Disp: 2 mL, Rfl: 2  Current Facility-Administered Medications:    botulinum toxin Type A  (BOTOX ) injection 155 Units, 155 Units, Intramuscular, Once, Ines Onetha NOVAK, MD    ALLERGIES   Aspirin    LMP 08/11/2014   LMP 08/11/2014    Review of Systems: Gen:  Denies  fever, sweats, chills weight loss  HEENT: Denies blurred vision, double vision, ear pain, eye pain, hearing loss, nose bleeds, sore throat Cardiac:  No dizziness, chest pain or heaviness, chest tightness,edema, No JVD Resp:   No cough, -sputum production, -shortness of breath,-wheezing, -hemoptysis,  Other:  All other  systems negative   Physical Examination:   General Appearance: No distress  EYES PERRLA, EOM intact.   NECK Supple, No JVD Pulmonary: normal breath sounds,  No wheezing.  CardiovascularNormal S1,S2.  No m/r/g.   Abdomen: Benign, Soft, non-tender. Neurology UE/LE 5/5 strength, no focal deficits Ext pulses intact, cap refill intact ALL OTHER ROS ARE NEGATIVE      IMAGING    No results found.     ASSESSMENT/PLAN   58 year old pleasant white female seen today for assessment of emphysema and COPD with active smoking and significant smoking history with signs symptoms of fatigue migraines excessive daytime sleepiness probable underlying OSA in the setting of obesity and deconditioned state  Assessment of COPD Recommend obtaining pulmonary function testing Symbicort  is not helping we will try Trelegy inhaler 200 Rinse mouth after use Albuterol  as needed Nebulizers as needed Avoid Allergens and Irritants Avoid secondhand smoke Avoid SICK contacts Recommend  Masking  when appropriate Recommend Keep up-to-date with vaccinations No exacerbation at this time No evidence of heart failure at this time No evidence or signs of infection at this time No respiratory distress No fevers, chills, nausea, vomiting, diarrhea No evidence of lower extremity edema No evidence hemoptysis   Assessment of OSA Recommend home sleep study  Smoking Assessment and Cessation Counseling Upon further questioning, Patient smokes 1 ppd I have advised patient to quit/stop smoking as soon as possible due to high risk for multiple medical problems Patient is NOT willing to quit smoking I have advised patient that we can assist and have options of Nicotine  replacement therapy. I also advised patient on behavioral therapy and can provide oral medication therapy in conjunction with the other therapies Follow up next Office visit  for assessment of smoking cessation Smoking cessation counseling advised  for 4 minutes   Obesity -recommend significant weight loss -recommend changing diet  Deconditioned state -Recommend increased daily activity and exercise   Abnormal CT chest opacifications and CT scan 2023 Recommend repeat CT chest to assess for unresolving pneumonia and malignancy   MEDICATION ADJUSTMENTS/LABS AND TESTS ORDERED: PFT CT chest HST Trelegy 200    CURRENT MEDICATIONS REVIEWED AT LENGTH WITH PATIENT TODAY   Patient  satisfied with Plan of action and management. All questions answered   Follow up 3 months   I spent a total of 78 minutes dedicated to the care of this patient on the date of this encounter to include pre-visit review of records, face-to-face time with the patient discussing conditions above, post visit ordering of testing, clinical documentation with the electronic health record, making appropriate referrals as documented, and communicating necessary information to the patient's healthcare team.    The Patient requires high complexity decision making for assessment and support, frequent evaluation and titration of therapies, application of advanced monitoring technologies and extensive interpretation of multiple databases.  Patient satisfied with Plan of action and management. All questions answered    Nickolas Alm Cellar, M.D.  [facility]  Medical Director [facility] Medical Director [facility]io-P[facility] Department

## 2024-08-29 ENCOUNTER — Encounter: Payer: Self-pay | Admitting: Family Medicine

## 2024-08-31 ENCOUNTER — Other Ambulatory Visit: Payer: Self-pay | Admitting: Family Medicine

## 2024-08-31 DIAGNOSIS — J432 Centrilobular emphysema: Secondary | ICD-10-CM

## 2024-08-31 DIAGNOSIS — F339 Major depressive disorder, recurrent, unspecified: Secondary | ICD-10-CM

## 2024-09-02 ENCOUNTER — Other Ambulatory Visit: Payer: Self-pay | Admitting: Family Medicine

## 2024-09-02 DIAGNOSIS — G43909 Migraine, unspecified, not intractable, without status migrainosus: Secondary | ICD-10-CM

## 2024-09-02 NOTE — Telephone Encounter (Signed)
 Requested Prescriptions  Pending Prescriptions Disp Refills   VENTOLIN  HFA 108 (90 Base) MCG/ACT inhaler [Pharmacy Med Name: VENTOLIN  HFA INH W/DOS CTR 200PUFFS] 18 g 0    Sig: INHALE 2 PUFFS INTO THE LUNGS EVERY 6 HOURS AS NEEDED FOR WHEEZING OR SHORTNESS OF BREATH     Pulmonology:  Beta Agonists 2 Passed - 09/02/2024  3:31 PM      Passed - Last BP in normal range    BP Readings from Last 1 Encounters:  06/14/24 124/60         Passed - Last Heart Rate in normal range    Pulse Readings from Last 1 Encounters:  06/14/24 69         Passed - Valid encounter within last 12 months    Recent Outpatient Visits           3 months ago Type 2 diabetes mellitus with other specified complication, without long-term current use of insulin  [facility])   [place] [facility] [place], Marsa PARAS, DO   5 months ago Episodic migraine   [place] [facility] [place] Marsa PARAS, DO   6 months ago Episodic migraine   [place] [facility] [place], Marsa PARAS, DO   7 months ago Hordeolum externum of right upper eyelid   [place] [facility] [place], Marsa PARAS, DO   7 months ago Episodic migraine   [place] [facility] [place], Marsa PARAS, DO       Future Appointments             In 1 week Darliss Rogue, MD [facility] at [facility]             buPROPion  (WELLBUTRIN  XL) 150 MG 24 hr tablet [Pharmacy Med Name: BUPROPION  XL 150MG  TABLETS (24 H)] 90 tablet 0    Sig: TAKE 1 TABLET(150 MG) BY MOUTH DAILY     Psychiatry: Antidepressants - bupropion  Passed - 09/02/2024  3:31 PM      Passed - Cr in normal range and within 360 days    Creat  Date Value Ref Range Status  07/30/2018 0.89 0.50 - 1.05 mg/dL Final    Comment:    For patients >16 years of age, the reference limit for Creatinine is approximately 13% higher for people identified as African-American. .     Creatinine, Ser  Date Value Ref Range Status  06/14/2024 0.92 0.57 - 1.00 mg/dL Final         Passed - AST in normal range and within 360 days    AST  Date Value Ref Range Status  09/21/2023 17 15 - 41 U/L Final   SGOT(AST)  Date Value Ref Range Status  01/07/2015 24 U/L Final    Comment:    15-41 NOTE: New Reference Range  12/16/14          Passed - ALT in normal range and within 360 days    ALT  Date Value Ref Range Status  09/21/2023 26 0 - 44 U/L Final   SGPT (ALT)  Date Value Ref Range Status  01/07/2015 21 U/L Final    Comment:    14-54 NOTE: New Reference Range  12/16/14          Passed - Completed PHQ-2 or PHQ-9 in the last 360 days      Passed - Last BP in normal range    BP Readings from Last 1 Encounters:  06/14/24  124/60         Passed - Valid encounter within last 6 months    Recent Outpatient Visits           3 months ago Type 2 diabetes mellitus with other specified complication, without long-term current use of insulin  [facility])   [place] [facility] [place], Marsa PARAS, DO   5 months ago Episodic migraine   [place] [facility] [place] Marsa PARAS, DO   6 months ago Episodic migraine   [place] [facility] [place] Marsa PARAS, DO   7 months ago Hordeolum externum of right upper eyelid   [place] [facility] [place] Marsa PARAS, DO   7 months ago Episodic migraine    [facility] [place] Marsa PARAS, DO       Future Appointments             In 1 week Agbor-Etang, Redell, MD [place] at [facility]

## 2024-09-03 ENCOUNTER — Other Ambulatory Visit: Payer: Self-pay

## 2024-09-03 ENCOUNTER — Encounter: Payer: Self-pay | Admitting: Family Medicine

## 2024-09-03 DIAGNOSIS — G43909 Migraine, unspecified, not intractable, without status migrainosus: Secondary | ICD-10-CM

## 2024-09-03 MED ORDER — EMGALITY 120 MG/ML ~~LOC~~ SOAJ: SUBCUTANEOUS | 0 refills | Status: DC

## 2024-09-03 NOTE — Telephone Encounter (Signed)
 Too soon for refill, refilled 09/03/24.  Requested Prescriptions  Pending Prescriptions Disp Refills   EMGALITY  120 MG/ML SOAJ [Pharmacy Med Name: EMGALITY  120MG /ML AUTO INJECTOR 1ML] 2 mL 0    Sig: INJECT 120 MG UNDER THE SKIN ONCE MONTHLY     Off-Protocol Failed - 09/03/2024 12:35 PM      Failed - Medication not assigned to a protocol, review manually.      Passed - Valid encounter within last 12 months    Recent Outpatient Visits           3 months ago Type 2 diabetes mellitus with other specified complication, without long-term current use of insulin  [facility])   [place] [facility] [place], Marsa PARAS, DO   5 months ago Episodic migraine   [facility] [place] Marsa PARAS, DO   6 months ago Episodic migraine   [place] [facility] [place] Marsa PARAS, DO   7 months ago Hordeolum externum of right upper eyelid   [place] [facility] [place] Marsa PARAS, DO   7 months ago Episodic migraine   [place] [facility] [place] Marsa PARAS, DO       Future Appointments             In 1 week Agbor-Etang, Redell, MD [facility] at [facility]

## 2024-09-08 ENCOUNTER — Other Ambulatory Visit: Payer: Self-pay | Admitting: Family Medicine

## 2024-09-08 DIAGNOSIS — G43909 Migraine, unspecified, not intractable, without status migrainosus: Secondary | ICD-10-CM

## 2024-09-08 DIAGNOSIS — M4722 Other spondylosis with radiculopathy, cervical region: Secondary | ICD-10-CM

## 2024-09-10 ENCOUNTER — Other Ambulatory Visit: Payer: Self-pay | Admitting: Family Medicine

## 2024-09-10 ENCOUNTER — Ambulatory Visit: Admitting: Family Medicine

## 2024-09-10 ENCOUNTER — Encounter: Payer: Self-pay | Admitting: Family Medicine

## 2024-09-10 VITALS — BP 124/82 | HR 58 | Ht 67.0 in | Wt 249.1 lb

## 2024-09-10 DIAGNOSIS — J432 Centrilobular emphysema: Secondary | ICD-10-CM | POA: Diagnosis not present

## 2024-09-10 DIAGNOSIS — F411 Generalized anxiety disorder: Secondary | ICD-10-CM

## 2024-09-10 DIAGNOSIS — Z1231 Encounter for screening mammogram for malignant neoplasm of breast: Secondary | ICD-10-CM | POA: Diagnosis not present

## 2024-09-10 DIAGNOSIS — M4722 Other spondylosis with radiculopathy, cervical region: Secondary | ICD-10-CM

## 2024-09-10 DIAGNOSIS — Z7985 Long-term (current) use of injectable non-insulin antidiabetic drugs: Secondary | ICD-10-CM | POA: Diagnosis not present

## 2024-09-10 DIAGNOSIS — E782 Mixed hyperlipidemia: Secondary | ICD-10-CM

## 2024-09-10 DIAGNOSIS — E1169 Type 2 diabetes mellitus with other specified complication: Secondary | ICD-10-CM | POA: Diagnosis not present

## 2024-09-10 DIAGNOSIS — F339 Major depressive disorder, recurrent, unspecified: Secondary | ICD-10-CM

## 2024-09-10 DIAGNOSIS — I10 Essential (primary) hypertension: Secondary | ICD-10-CM

## 2024-09-10 DIAGNOSIS — G43719 Chronic migraine without aura, intractable, without status migrainosus: Secondary | ICD-10-CM | POA: Diagnosis not present

## 2024-09-10 DIAGNOSIS — Z0000 Encounter for general adult medical examination without abnormal findings: Secondary | ICD-10-CM

## 2024-09-10 MED ORDER — TRULICITY 3 MG/0.5ML ~~LOC~~ SOAJ: 3.0000 mg | SUBCUTANEOUS | 5 refills | Status: AC

## 2024-09-10 MED ORDER — BUPROPION HCL ER (XL) 150 MG PO TB24: 150.0000 mg | ORAL_TABLET | Freq: Every day | ORAL | 1 refills | Status: AC

## 2024-09-10 MED ORDER — GABAPENTIN 300 MG PO CAPS: 600.0000 mg | ORAL_CAPSULE | Freq: Two times a day (BID) | ORAL | 1 refills | Status: AC

## 2024-09-10 NOTE — Patient Instructions (Addendum)
 Thank you for coming to the office today.  We will contact the Rx PA Approval Team through [facility] and find out status of Emgality  and proceed with this. You can call [facility] to find out who they can schedule you with ASAP to take the place of Dr Ines. They can schedule you and hopefully pick up where you left off, with future Botox  etc.  For Mammogram screening for breast cancer   Call the Imaging Center below anytime to schedule your own appointment now that order has been placed.  [facility] at [facility] [address], [address] [place], [place] [address] Phone: [phone]  ------------  Trulicity  increase from 1.5 up to 3mg  weekly  Colon Cancer Screening: Ordered the Cologuard (home kit) test for colon cancer screening. Stay tuned for further updates.  It will be shipped to you directly. If not received in 2-4 weeks, call us  or the company.   If you send it back and no results are received in 2-4 weeks, call us  or the company as well!   Colon Cancer Screening: Follow instructions to collect sample, you may call the company for any help or questions, 24/7 telephone support at [phone].  DUE for FASTING BLOOD WORK (no food or drink after midnight before the lab appointment, only water or coffee without cream/sugar on the morning of)  SCHEDULE Lab Only visit in the morning at the clinic for lab draw in 3 MONTHS   - Make sure Lab Only appointment is at about 1 week before your next appointment, so that results will be available  For Lab Results, once available within 2-3 days of blood draw, you can can log in to MyChart online to view your results and a brief explanation. Also, we can discuss results at next follow-up visit.   Please schedule a Follow-up Appointment to: Return in about 3 months (around 12/09/2024) for 3 month fasting lab > 1 week later Annual Physical.  If you have any other questions or concerns,  please feel free to call the office or send a message through MyChart. You may also schedule an earlier appointment if necessary.  Additionally, you may be receiving a survey about your experience at our office within a few days to 1 week by e-mail or mail. We value your feedback.  Marsa Officer, DO [facility], [place]

## 2024-09-10 NOTE — Progress Notes (Signed)
 Subjective:    Patient ID: Christy Mayer, female    DOB: 04/10/1966, 58 y.o.   MRN: [id number]  Christy Mayer is a 58 y.o. female presenting on 09/10/2024 for Medical Management of Chronic Issues, Diabetes, and Migraine   HPI  Discussed the use of AI scribe software for clinical note transcription with the patient, who gave verbal consent to proceed.  History of Present Illness   Christy Mayer is a 58 year old female with migraines and type 2 diabetes who presents for medication refills and management of her chronic conditions.  Chronic Headache / Migraines Cephalalgia and migraine management Previously followed by [facility] in [place], provider Dr Ines no longer at that practice. - Migraines have intensified after running out of Emgality , which was previously effective. - Overdue for Botox  injection, previously managed at [facility]. - Currently taking gabapentin  600 mg twice daily and requires a refill. - Migraine cannot sit and look at computer screen  Chronic back pain and neurological symptoms Followed by Physiatry, prior injection therapy - Chronic back pain, particularly on the left side. - Numbness in toes. - Swelling in left leg greater than right.     Centrilobular Emphysema Followed by Pulmonology Off Symbicort  On Trelegy, doing well with less flares - Switched from Symbicort  to Trelegy for respiratory issues, which is effective. - Experiences seasonal exacerbations of respiratory symptoms due to sinus issues.  CHRONIC DM, Type 2: Some elevated CBG >150-200+. Last A1c >7 Meds: Trulicity  1.5mg  weekly, Lantus  20 u nightly Reports good compliance. 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, few toes on each foot numbness Denies hypoglycemia, polyuria, visual changes   Health Maintenance: Mammogram ordered     09/10/2024   11:34 PM 05/13/2024    2:55 PM 01/25/2024     9:33 AM  Depression screen PHQ 2/9  Decreased Interest 0 0 2  Down, Depressed, Hopeless 0 0 0  PHQ - 2 Score 0 0 2  Altered sleeping 0 0 0  Tired, decreased energy 3 3 2   Change in appetite 0 0 0  Feeling bad or failure about yourself  0 0 0  Trouble concentrating 0 0 0  Moving slowly or fidgety/restless 0 0 0  Suicidal thoughts 0 0 0  PHQ-9 Score 3 3  4    Difficult doing work/chores Somewhat difficult  Very difficult     Data saved with a previous flowsheet row definition       05/13/2024    2:56 PM 01/25/2024    9:34 AM 10/06/2023   11:00 AM 02/15/2022    1:21 PM  GAD 7 : Generalized Anxiety Score  Nervous, Anxious, on Edge 0 0 3 1  Control/stop worrying 0 0 0 0  Worry too much - different things 0 0 1 0  Trouble relaxing 0 0 0 0  Restless 0 0 0 0  Easily annoyed or irritable 0 0 2 0  Afraid - awful might happen 0 0 0 0  Total GAD 7 Score 0 0 6 1  Anxiety Difficulty Not difficult at all Not difficult at all  Not difficult at all    Social History   Tobacco Use   Smoking status: Every Day    Current packs/day: 0.50    Average packs/day: 0.5 packs/day for 30.0 years (15.0 ttl pk-yrs)    Types: Cigarettes    Passive exposure: Never   Smokeless tobacco: Never  Vaping Use   Vaping status: Never Used  Substance Use Topics   Alcohol  use: Yes    Alcohol /week: 0.0 standard drinks of alcohol     Comment: occasionally    Drug use: No    Review of Systems Per HPI unless specifically indicated above     Objective:    BP 124/82 (BP Location: Right Arm, Patient Position: Sitting, Cuff Size: Large)   Pulse (!) 58   Ht 5' 7 (1.702 m)   Wt 249 lb 2 oz (113 kg)   LMP 08/11/2014   SpO2 98%   BMI 39.02 kg/m   Wt Readings from Last 3 Encounters:  09/10/24 249 lb 2 oz (113 kg)  06/14/24 241 lb 6.4 oz (109.5 kg)  05/24/24 240 lb 3.2 oz (109 kg)    Physical Exam Vitals and nursing note reviewed.  Constitutional:      General: She is not in acute distress.     Appearance: She is well-developed. She is not diaphoretic.     Comments: Mild discomfort with headache, cooperative  HENT:     Head: Normocephalic and atraumatic.  Eyes:     General:        Right eye: No discharge.        Left eye: No discharge.     Conjunctiva/sclera: Conjunctivae normal.  Neck:     Thyroid: No thyromegaly.  Cardiovascular:     Rate and Rhythm: Normal rate and regular rhythm.     Heart sounds: Normal heart sounds. No murmur heard. Pulmonary:     Effort: Pulmonary effort is normal. No respiratory distress.     Breath sounds: Normal breath sounds. No wheezing or rales.  Musculoskeletal:        General: Normal range of motion.     Cervical back: Normal range of motion and neck supple.  Lymphadenopathy:     Cervical: No cervical adenopathy.  Skin:    General: Skin is warm and dry.     Findings: No erythema or rash.  Neurological:     Mental Status: She is alert and oriented to person, place, and time.  Psychiatric:        Behavior: Behavior normal.     Comments: Well groomed, good eye contact, normal speech and thoughts     Results for orders placed or performed in visit on 06/14/24  Basic metabolic panel with GFR   Collection Time: 06/14/24 12:22 PM  Result Value Ref Range   Glucose 163 (H) 70 - 99 mg/dL   BUN 13 6 - 24 mg/dL   Creatinine, Ser 9.07 0.57 - 1.00 mg/dL   eGFR 72 >40 fO/fpw/8.26   BUN/Creatinine Ratio 14 9 - 23   Sodium 141 134 - 144 mmol/L   Potassium 4.7 3.5 - 5.2 mmol/L   Chloride 101 96 - 106 mmol/L   CO2 22 20 - 29 mmol/L   Calcium  9.3 8.7 - 10.2 mg/dL      Assessment & Plan:   Problem List Items Addressed This Visit     Centrilobular emphysema (HCC)   GAD (generalized anxiety disorder)   Relevant Medications   buPROPion  (WELLBUTRIN  XL) 150 MG 24 hr tablet   Intractable chronic migraine without aura and without status migrainosus   Relevant Medications   gabapentin  (NEURONTIN ) 300 MG capsule   buPROPion  (WELLBUTRIN  XL) 150  MG 24 hr tablet   Major depression, recurrent, chronic   Relevant Medications   buPROPion  (WELLBUTRIN  XL) 150 MG 24 hr tablet   Osteoarthritis  of spine with radiculopathy, cervical region   Relevant Medications   gabapentin  (NEURONTIN ) 300 MG capsule   buPROPion  (WELLBUTRIN  XL) 150 MG 24 hr tablet   Type 2 diabetes mellitus with other specified complication (HCC) - Primary   Relevant Medications   TRULICITY  3 MG/0.5ML SOAJ   Other Visit Diagnoses       Long-term current use of injectable noninsulin antidiabetic medication         Encounter for screening mammogram for malignant neoplasm of breast       Relevant Orders   MM 3D SCREENING MAMMOGRAM BILATERAL BREAST        Type 2 diabetes mellitus Blood sugar averages 200 mg/dL, morning readings 761 mg/dL, reduced to 821 mg/dL with Lantus . A1c improved to 7.8%. Plan to increase Trulicity  to 3 mg for better control. - Increased Trulicity  dose to 3 mg. - Scheduled follow-up blood work in 3 months.  Intractable chronic migraine Migraines returned post-Emgality  discontinuation ran out due to pending coverage w/ insurance. Previously followed by [facility], but her provider left that practice. previously Botox  effective but overdue. Insurance delays Emgality  approval. [facility] preferred for management, uncertainty due to provider change. - Contacted RX PA approval team for [employer] coverage / PA. It was ordered on 09/03/24 but remains pending. - Patient will contact [facility] for new provider appointment. - Consider alternative centers if necessary.  Osteoarthritis of cervical spine with radiculopathy Chronic left-sided back pain with toe numbness suggests spinal involvement. Previous injection increased pain, indicating nerve irritation.  Major depressive disorder, recurrent Depression worsened by health issues.  - Refilled Wellbutrin  for 90 days.  Generalized anxiety disorder Anxiety linked to high  blood sugar and health concerns.  - Continue Wellbutrin .  General health maintenance Routine maintenance due. Vision stable, no diabetic retinopathy. Declined pneumonia vaccine due to past reactions. - Schedule mammogram at [facility]. - Contact [facility] to resend kit. - Declined pneumonia vaccination.     Patient remains on Long Term Disability, applying for social security disability   Rx PA Team for Emgality , was ordered 09/03/24, says still pending PA or approval, patient has not been able to fill - will route inquiry to PA Rx Team    Orders Placed This Encounter  Procedures   MM 3D SCREENING MAMMOGRAM BILATERAL BREAST    Standing Status:   Future    Expiration Date:   09/10/2025    Reason for Exam (SYMPTOM  OR DIAGNOSIS REQUIRED):   Screening bilateral 3D Mammogram Tomo    Preferred imaging location?:   [facility]    Meds ordered this encounter  Medications   gabapentin  (NEURONTIN ) 300 MG capsule    Sig: Take 2 capsules (600 mg total) by mouth 2 (two) times daily.    Dispense:  360 capsule    Refill:  1    90 day   buPROPion  (WELLBUTRIN  XL) 150 MG 24 hr tablet    Sig: Take 1 tablet (150 mg total) by mouth daily.    Dispense:  90 tablet    Refill:  1    Add future refills   TRULICITY  3 MG/0.5ML SOAJ    Sig: Inject 3 mg as directed once a week.    Dispense:  2 mL    Refill:  5    Dose increase from 1.5 to 3mg     Follow up plan: Return in about 3 months (around 12/09/2024) for 3 month fasting lab > 1 week later Annual Physical.  Future labs  ordered for 12/09/24   Marsa Officer, DO [facility] 09/10/2024, 2:49 PM

## 2024-09-12 ENCOUNTER — Encounter: Payer: Self-pay | Admitting: Family Medicine

## 2024-09-12 ENCOUNTER — Telehealth: Payer: Self-pay | Admitting: Pharmacy Technician

## 2024-09-12 ENCOUNTER — Other Ambulatory Visit (HOSPITAL_COMMUNITY): Payer: Self-pay

## 2024-09-12 DIAGNOSIS — G43909 Migraine, unspecified, not intractable, without status migrainosus: Secondary | ICD-10-CM

## 2024-09-12 MED ORDER — EMGALITY 120 MG/ML ~~LOC~~ SOAJ: 120.0000 mg | SUBCUTANEOUS | 5 refills | Status: AC

## 2024-09-12 NOTE — Telephone Encounter (Signed)
-----   Message from Marsa Officer sent at 09/10/2024 11:47 PM EST ----- Hello, I am routing this chart for your assistance. She was previously controlled on Emgality  monthly maintenance injections for Chronic Migraines. She was followed by Neurologist Dr Ines previously, however that provider is no longer at that office. I have been ordering her Emgality  injections and recently the rx was sent on 09/03/24 but has been pending insurance approval vs PA since that time. I don't see notes on the chart that you are involved in her PA for Emgality  yet. It would be for maintenance therapy and it was successful, she is now having breakthrough migraines being off of the medication. Let me know if need any other information or assistance. Thank you!

## 2024-09-12 NOTE — Telephone Encounter (Signed)
 Thank you, this appears to be correct. The Rx Emgality  was being sent for 2 quantity = 240mg  = loading dose, which is no longer needed.  I will send a new rx now for Emgality  1 quantity 120mg  once monthly with refills, and it sounds like this will solve the PA issue and be covered.  New rx sent.  Marsa Officer, DO [facility] 09/12/2024, 11:00 AM

## 2024-09-12 NOTE — Telephone Encounter (Signed)
 Pharmacy Patient Advocate Encounter   Received notification from cc chart messages that prior authorization for Emgality  is required/requested.   Insurance verification completed.   The patient is insured through [employer].   Per test claim: The current 30 day co-pay is, $4.  No PA needed at this time. This test claim was processed through [facility]- copay amounts may vary at other pharmacies due to pharmacy/plan contracts, or as the patient moves through the different stages of their insurance plan.   Due to the previous message, I messaged Dr. Lynae back to ask him if the pt needed the loading dose again, just being out of it for a month. I believe the issue with the pharmacy not filling the medication was due to it being written for 2 pens for 1 dose. Once that's confirmed, I'll see if it needs a PA then, or not.

## 2024-09-13 ENCOUNTER — Ambulatory Visit: Attending: Cardiology | Admitting: Cardiology

## 2024-10-01 ENCOUNTER — Other Ambulatory Visit: Payer: Self-pay | Admitting: Family Medicine

## 2024-10-01 DIAGNOSIS — J432 Centrilobular emphysema: Secondary | ICD-10-CM

## 2024-10-02 ENCOUNTER — Encounter: Payer: Self-pay | Admitting: Family Medicine

## 2024-10-02 NOTE — Telephone Encounter (Signed)
 Requested Prescriptions  Pending Prescriptions Disp Refills   VENTOLIN  HFA 108 (90 Base) MCG/ACT inhaler [Pharmacy Med Name: VENTOLIN  HFA INH W/DOS CTR 200PUFFS] 18 g 3    Sig: INHALE 2 PUFFS INTO THE LUNGS EVERY 6 HOURS AS NEEDED FOR WHEEZING OR SHORTNESS OF BREATH     Pulmonology:  Beta Agonists 2 Passed - 10/02/2024  3:46 PM      Passed - Last BP in normal range    BP Readings from Last 1 Encounters:  09/10/24 124/82         Passed - Last Heart Rate in normal range    Pulse Readings from Last 1 Encounters:  09/10/24 (!) 58         Passed - Valid encounter within last 12 months    Recent Outpatient Visits           3 weeks ago Type 2 diabetes mellitus with other specified complication, without long-term current use of insulin  [facility])   [facility] [place], Marsa PARAS, DO   4 months ago Type 2 diabetes mellitus with other specified complication, without long-term current use of insulin  [facility])   [place] [facility] [place] [facility]a PARAS, DO   6 months ago Episodic migraine   [place] [facility] [place] Marsa PARAS, DO   7 months ago Episodic migraine   [facility] [place], Marsa PARAS, DO   8 months ago Hordeolum externum of right upper eyelid   [place], Marsa PARAS, [place]

## 2024-10-11 ENCOUNTER — Other Ambulatory Visit: Payer: Self-pay | Admitting: Family Medicine

## 2024-10-11 DIAGNOSIS — R110 Nausea: Secondary | ICD-10-CM

## 2024-10-14 NOTE — Telephone Encounter (Signed)
 Requested medication (s) are due for refill today: Yes  Requested medication (s) are on the active medication list: Yes  Last refill:  05/27/24  Future visit scheduled: Yes  Notes to clinic:  Not delegated.    Requested Prescriptions  Pending Prescriptions Disp Refills   ondansetron  (ZOFRAN -ODT) 4 MG disintegrating tablet [Pharmacy Med Name: ONDANSETRON  ODT 4MG  TABLETS] 30 tablet 2    Sig: DISSOLVE 1 TABLET(4 MG) ON THE TONGUE EVERY 8 HOURS AS NEEDED FOR NAUSEA OR VOMITING     Not Delegated - Gastroenterology: Antiemetics - ondansetron  Failed - 10/14/2024 12:20 PM      Failed - This refill cannot be delegated      Failed - AST in normal range and within 360 days    AST  Date Value Ref Range Status  09/21/2023 17 15 - 41 U/L Final   SGOT(AST)  Date Value Ref Range Status  01/07/2015 24 U/L Final    Comment:    15-41 NOTE: New Reference Range  12/16/14          Failed - ALT in normal range and within 360 days    ALT  Date Value Ref Range Status  09/21/2023 26 0 - 44 U/L Final   SGPT (ALT)  Date Value Ref Range Status  01/07/2015 21 U/L Final    Comment:    14-54 NOTE: New Reference Range  12/16/14          Passed - Valid encounter within last 6 months    Recent Outpatient Visits           1 month ago Type 2 diabetes mellitus with other specified complication, without long-term current use of insulin  [facility])   [place] [facility] [place], Marsa PARAS, DO   5 months ago Type 2 diabetes mellitus with other specified complication, without long-term current use of insulin  [facility])   [place] [facility] [place] Marsa PARAS, DO   6 months ago Episodic migraine   [place] [facility] [place] Marsa PARAS, DO   7 months ago Episodic migraine   [place] [facility] [place], Marsa PARAS, DO   8 months ago Hordeolum externum of right upper eyelid    [facility], Marsa PARAS, [place]

## 2024-10-18 LAB — GENECONNECT MOLECULAR SCREEN: Genetic Analysis Overall Interpretation: NEGATIVE

## 2024-10-23 ENCOUNTER — Telehealth: Admitting: Neurology

## 2024-10-24 ENCOUNTER — Encounter: Payer: Self-pay | Admitting: Internal Medicine

## 2024-10-24 ENCOUNTER — Encounter: Payer: Self-pay | Admitting: Family Medicine

## 2024-10-24 ENCOUNTER — Encounter: Payer: Self-pay | Admitting: Cardiology

## 2024-10-24 DIAGNOSIS — R072 Precordial pain: Secondary | ICD-10-CM

## 2024-10-24 DIAGNOSIS — G43719 Chronic migraine without aura, intractable, without status migrainosus: Secondary | ICD-10-CM

## 2024-10-24 MED ORDER — METOPROLOL TARTRATE 100 MG PO TABS: ORAL_TABLET | ORAL | 0 refills | Status: AC

## 2024-10-24 NOTE — Telephone Encounter (Signed)
 I have spoke with Mrs. Christy Mayer I'm trying to get her approved to pick up our HST machine. She is aware she will need to get chest xray done before insurance will approve the CT

## 2024-10-24 NOTE — Telephone Encounter (Signed)
 Schedule patient with Dr. Gregg 05/10/25 at 11:45am

## 2024-10-25 NOTE — Addendum Note (Signed)
 Addended by: BRIEN SALM on: 10/25/2024 01:33 PM   Modules accepted: Orders

## 2024-10-25 NOTE — Telephone Encounter (Signed)
Left message on voice mail to schedule

## 2024-10-25 NOTE — Telephone Encounter (Signed)
 Patient carotid order canceled. Please place new order so we may schedule carotid if still needed

## 2024-11-06 ENCOUNTER — Encounter: Payer: Self-pay | Admitting: Family Medicine

## 2024-11-06 ENCOUNTER — Ambulatory Visit (HOSPITAL_COMMUNITY)

## 2024-11-06 DIAGNOSIS — I10 Essential (primary) hypertension: Secondary | ICD-10-CM

## 2024-11-08 ENCOUNTER — Encounter

## 2024-11-13 ENCOUNTER — Other Ambulatory Visit: Payer: Self-pay | Admitting: Family Medicine

## 2024-11-13 DIAGNOSIS — J432 Centrilobular emphysema: Secondary | ICD-10-CM

## 2024-11-13 DIAGNOSIS — F411 Generalized anxiety disorder: Secondary | ICD-10-CM

## 2024-11-14 ENCOUNTER — Ambulatory Visit

## 2024-11-14 ENCOUNTER — Telehealth: Payer: Self-pay | Admitting: Neurology

## 2024-11-14 NOTE — Telephone Encounter (Signed)
 MYC cxl

## 2024-11-15 NOTE — Telephone Encounter (Signed)
 Requested medication (s) are due for refill today: yes  Requested medication (s) are on the active medication list: yes  Last refill:  06/12/24 #60 2 RF  Future visit scheduled: yes  Notes to clinic:  med not delegated to NT to RF   Requested Prescriptions  Pending Prescriptions Disp Refills   ALPRAZolam  (XANAX ) 0.25 MG tablet [Pharmacy Med Name: ALPRAZOLAM  0.25MG  TABLETS] 60 tablet     Sig: TAKE 1 TABLET(0.25 MG) BY MOUTH TWICE DAILY     Not Delegated - Psychiatry: Anxiolytics/Hypnotics 2 Failed - 11/15/2024  8:25 AM      Failed - This refill cannot be delegated      Failed - Urine Drug Screen completed in last 360 days      Passed - Patient is not pregnant      Passed - Valid encounter within last 6 months    Recent Outpatient Visits           2 months ago Type 2 diabetes mellitus with other specified complication, without long-term current use of insulin   [facility])   [place] [facility] [place], Marsa PARAS, DO   6 months ago Type 2 diabetes mellitus with other specified complication, without long-term current use of insulin  [facility])   [place] [facility] [place], Marsa PARAS, DO   7 months ago Episodic migraine   [place] [facility] [place] Marsa PARAS, DO   8 months ago Episodic migraine   [place] [facility] [place]a PARAS, DO   9 months ago Hordeolum externum of right upper eyelid   [facility] [place], Marsa PARAS, DO              Signed Prescriptions Disp Refills   SYMBICORT  160-4.5 MCG/ACT inhaler 10.2 g 12    Sig: INHALE 2 PUFFS INTO THE LUNGS TWICE DAILY     Pulmonology:  Combination Products Passed - 11/15/2024  8:25 AM      Passed - Valid encounter within last 12 months    Recent Outpatient Visits           2 months ago Type 2 diabetes mellitus with other specified complication, without long-term current use of insulin  [facility])    [place] [facility] [place], Marsa PARAS, DO   6 months ago Type 2 diabetes mellitus with other specified complication, without long-term current use of insulin  [facility])   [place] [facility] [place] PARAS, DO   7 months ago Episodic migraine   [place] [facility] [place] Marsa PARAS, DO   8 months ago Episodic migraine   [place] [facility] [place] Marsa PARAS, DO   9 months ago Hordeolum externum of right upper eyelid    [facility] [place], Marsa PARAS, [place]

## 2024-11-15 NOTE — Telephone Encounter (Signed)
 Requested Prescriptions  Pending Prescriptions Disp Refills   ALPRAZolam  (XANAX ) 0.25 MG tablet [Pharmacy Med Name: ALPRAZOLAM  0.25MG  TABLETS] 60 tablet     Sig: TAKE 1 TABLET(0.25 MG) BY MOUTH TWICE DAILY     Not Delegated - Psychiatry: Anxiolytics/Hypnotics 2 Failed - 11/15/2024  8:25 AM      Failed - This refill cannot be delegated      Failed - Urine Drug Screen completed in last 360 days      Passed - Patient is not pregnant      Passed - Valid encounter within last 6 months    Recent Outpatient Visits           2 months ago Type 2 diabetes mellitus with other specified complication, without long-term current use of insulin  [facility])   [place] [facility] [place], Marsa PARAS, DO   6 months ago Type 2 diabetes mellitus with other specified complication, without long-term current use of insulin  [facility])   [place] [facility] [place] PARAS, DO   7 months ago Episodic migraine   [place] [facility] [place] [facility]a PARAS, DO   8 months ago Episodic migraine   [place] [facility] [place] Marsa PARAS, DO   9 months ago Hordeolum externum of right upper eyelid   [place] [facility] [place], Marsa PARAS, DO               SYMBICORT  160-4.5 MCG/ACT inhaler [Pharmacy Med Name: SYMBICORT  160/4. (120 ORAL INH)] 10.2 g 12    Sig: INHALE 2 PUFFS INTO THE LUNGS TWICE DAILY     Pulmonology:  Combination Products Passed - 11/15/2024  8:25 AM      Passed - Valid encounter within last 12 months    Recent Outpatient Visits           2 months ago Type 2 diabetes mellitus with other specified complication, without long-term current use of insulin  [facility])   [place] [facility] [place], Marsa PARAS, DO   6 months ago Type 2 diabetes mellitus with other specified complication, without long-term current use of insulin  [facility])   [place] [facility] [place] Mars[place] PARAS, DO   7 months ago Episodic migraine   [place] [facility] [place] Marsa PARAS, DO   8 months ago Episodic migraine   [place] [facility] [place] Marsa PARAS, DO   9 months ago Hordeolum externum of right upper eyelid    [facility] [place], Marsa PARAS, [place]

## 2024-11-19 ENCOUNTER — Encounter

## 2024-11-28 ENCOUNTER — Ambulatory Visit

## 2024-12-09 ENCOUNTER — Other Ambulatory Visit

## 2024-12-16 ENCOUNTER — Encounter: Admitting: Family Medicine

## 2025-05-19 ENCOUNTER — Ambulatory Visit: Admitting: Neurology
